# Patient Record
Sex: Female | Born: 1960
Health system: Southern US, Community
[De-identification: ages and names within clinical notes are randomized; demographics above are authoritative.]

## PROBLEM LIST (undated history)

## (undated) DIAGNOSIS — N3000 Acute cystitis without hematuria: Secondary | ICD-10-CM

## (undated) DIAGNOSIS — Z860100 Personal history of colon polyps, unspecified: Secondary | ICD-10-CM

## (undated) DIAGNOSIS — D259 Leiomyoma of uterus, unspecified: Secondary | ICD-10-CM

## (undated) DIAGNOSIS — C8590 Non-Hodgkin lymphoma, unspecified, unspecified site: Secondary | ICD-10-CM

## (undated) DIAGNOSIS — E7800 Pure hypercholesterolemia, unspecified: Secondary | ICD-10-CM

## (undated) DIAGNOSIS — Z8619 Personal history of other infectious and parasitic diseases: Secondary | ICD-10-CM

## (undated) DIAGNOSIS — F419 Anxiety disorder, unspecified: Secondary | ICD-10-CM

## (undated) DIAGNOSIS — I10 Essential (primary) hypertension: Secondary | ICD-10-CM

## (undated) DIAGNOSIS — M1990 Unspecified osteoarthritis, unspecified site: Secondary | ICD-10-CM

## (undated) DIAGNOSIS — K5090 Crohn's disease, unspecified, without complications: Secondary | ICD-10-CM

## (undated) DIAGNOSIS — G4700 Insomnia, unspecified: Secondary | ICD-10-CM

## (undated) DIAGNOSIS — G43909 Migraine, unspecified, not intractable, without status migrainosus: Secondary | ICD-10-CM

## (undated) DIAGNOSIS — C801 Malignant (primary) neoplasm, unspecified: Secondary | ICD-10-CM

## (undated) HISTORY — DX: Migraine, unspecified, not intractable, without status migrainosus: G43.909

## (undated) HISTORY — DX: Anxiety disorder, unspecified: F41.9

## (undated) HISTORY — DX: Personal history of colonic polyps: Z86.010

## (undated) HISTORY — DX: Acute cystitis without hematuria: N30.00

## (undated) HISTORY — DX: Crohn's disease, unspecified, without complications: K50.90

## (undated) HISTORY — DX: Essential (primary) hypertension: I10

## (undated) HISTORY — DX: Leiomyoma of uterus, unspecified: D25.9

## (undated) HISTORY — DX: Unspecified osteoarthritis, unspecified site: M19.90

## (undated) HISTORY — DX: Non-Hodgkin lymphoma, unspecified, unspecified site: C85.90

## (undated) HISTORY — DX: Pure hypercholesterolemia, unspecified: E78.00

## (undated) HISTORY — PX: TUBAL LIGATION: SHX77

## (undated) HISTORY — DX: Personal history of colon polyps, unspecified: Z86.0100

---

## 2007-04-05 HISTORY — PX: ESOPHAGOGASTRODUODENOSCOPY: SHX1529

## 2014-09-11 HISTORY — PX: COLONOSCOPY: SHX174

## 2017-10-02 HISTORY — PX: COLONOSCOPY: SHX174

## 2018-10-19 ENCOUNTER — Other Ambulatory Visit: Payer: Self-pay

## 2018-10-19 ENCOUNTER — Telehealth (INDEPENDENT_AMBULATORY_CARE_PROVIDER_SITE_OTHER): Payer: Commercial Managed Care - PPO | Admitting: Gastroenterology

## 2018-10-19 ENCOUNTER — Encounter: Payer: Self-pay | Admitting: Gastroenterology

## 2018-10-19 VITALS — Ht 59.0 in | Wt 140.0 lb

## 2018-10-19 DIAGNOSIS — K50919 Crohn's disease, unspecified, with unspecified complications: Secondary | ICD-10-CM

## 2018-10-19 DIAGNOSIS — R109 Unspecified abdominal pain: Secondary | ICD-10-CM | POA: Diagnosis not present

## 2018-10-19 NOTE — Progress Notes (Signed)
Chief Complaint:   Referring Provider:  [name], MD      ASSESSMENT AND PLAN;   #1.  Abdo pain LLQ/LUQ  #2. Crohn's disease: Dx 2004, H/O PSBO 02/2007 managed conservatively, involving TI and right colon.  In remission with Lialda.  Last colon 09/2017 Dr Jerilynn Mages (report awaited)   Plan: - CT abdo/pelvis with PO/IV contrast. - CBC, CMP, CRP and sed rate. - Please obtain previous records. - Continue lialda 2/day. - May need steroids. - FU after CT.   HPI:    Denise Walls is a 58 y.o. female  Who does not speak any [place], daughter is the interpreter C/O abdo pain LUQ/LLQ -crampy, associated with nausea but no vomiting.  She denies having any significant heartburn. Has been having increasing abdominal bloating. Denies having any diarrhea.  Lately has been more constipated. Has not been eating very well. Has lost 10 pounds over last 1 month. No fever chills or joint pains.  No skin rash.  Denies having any jaundice dark urine or pale stools.  No urinary symptoms.  Past GI procedures: -Colonoscopy 09/11/2014 ([facility]) small internal hemorrhoids.  No active Crohn's.  Normal TI. Bx- neg.  Past Medical History:  Diagnosis Date  . Acute cystitis   . Anxiety   . Arthritis   . Crohn disease ([place])    dx 2004, history of small bowel obstruction 02/2007 treated conservatively  . History of colon polyps   . HTN (hypertension)   . Hypercholesterolemia   . Migraine   . Uterine fibroid     Past Surgical History:  Procedure Laterality Date  . COLONOSCOPY  09/11/2014   Small internal hemorrhoids. Otherwise normal colonoscopy to terminal ileum  . COLONOSCOPY  10/02/2017  . ESOPHAGOGASTRODUODENOSCOPY  04/05/2007   Mild gastritis. Otherwise, normal esophagogastroduodenoscopy  . TUBAL LIGATION      Family History  Problem Relation Age of Onset  . Colon cancer Neg Hx     Social History   Tobacco Use  . Smoking status: Not on file  Substance Use Topics  . Alcohol use:  Not on file  . Drug use: Not on file    Current Outpatient Medications  Medication Sig Dispense Refill  . benzonatate (TESSALON) 100 MG capsule 2 capsules daily as needed.    . hydrochlorothiazide (HYDRODIURIL) 25 MG tablet 1 tablet daily.    Marland Kitchen lovastatin (MEVACOR) 40 MG tablet Take 1 tablet by mouth daily.    . mesalamine (LIALDA) 1.2 g EC tablet 2 tablets daily.    . potassium chloride (KLOR-CON) 8 MEQ tablet Take 1 tablet by mouth daily.    Marland Kitchen amLODipine (NORVASC) 5 MG tablet Take 1 tablet by mouth daily.     No current facility-administered medications for this visit.     Allergies  Allergen Reactions  . Nsaids     Abdominal pain, GI Bleeding     Review of Systems:  Constitutional: Denies fever, chills, diaphoresis, appetite change and fatigue.  HEENT: Denies photophobia, eye pain, redness, hearing loss, ear pain, congestion, sore throat, rhinorrhea, sneezing, mouth sores, neck pain, neck stiffness and tinnitus.   Respiratory: Denies SOB, DOE, cough, chest tightness,  and wheezing.   Cardiovascular: Denies chest pain, palpitations and leg swelling.  Genitourinary: Denies dysuria, urgency, frequency, hematuria, flank pain and difficulty urinating.  Musculoskeletal: Denies myalgias, back pain, joint swelling, arthralgias and gait problem.  Skin: No rash.  Neurological: Denies dizziness, seizures, syncope, weakness, light-headedness, numbness and headaches.  Hematological: Denies adenopathy.  Easy bruising, personal or family bleeding history  Psychiatric/Behavioral: Has anxiety/depression     Physical Exam:    Ht 4' 11"  (1.499 m)   Wt 140 lb (63.5 kg)   BMI 28.28 kg/m  Filed Weights   10/19/18 1551  Weight: 140 lb (63.5 kg)   Constitutional:  Well-developed, in no acute distress. Psychiatric: Normal mood and affect. Behavior is normal.   Data Reviewed: I have personally reviewed following labs and imaging studies This service was provided via telemedicine.  The  patient was located at home.  The provider was located in office.  The patient did consent to this telephone visit and is aware of possible charges through their insurance for this visit.  The patient was referred by Dr. Selena Batten.  The other persons participating in this telemedicine service were Yasmin (daughter) and their role was interpreter.  Time spent on call/coordination of care: 45 min    Carmell Austria, MD 10/19/2018, 4:00 PM  Cc: [name], MD

## 2018-10-19 NOTE — Patient Instructions (Addendum)
To help prevent the possible spread of infection to our patients, communities, and staff; we will be implementing the following measures:  As of now we are not allowing any visitors/family members to accompany you to any upcoming appointments with [facility]. If you have any concerns about this please contact our office to discuss prior to the appointment.   You have been scheduled for a CT [facility] of the abdomen and pelvis [facility] [place] [address] [facility]   You are scheduled on Friday June 12th at 10:00am. You should arrive 15 minutes prior to your appointment time for registration. Please follow the written instructions below on the day of your exam:  WARNING: IF YOU ARE ALLERGIC TO IODINE/X-RAY DYE, PLEASE NOTIFY RADIOLOGY IMMEDIATELY AT [phone]! YOU WILL BE GIVEN A 13 HOUR PREMEDICATION PREP.  1) Do not eat or drink anything after 6:00am (4 hours prior to your test) 2) You have been given 2 bottles of oral contrast to drink. The solution may taste better if refrigerated, but do NOT add ice or any other liquid to this solution. Shake well before drinking.    Drink 1 bottle of contrast @ 8:00am (2 hours prior to your exam)  Drink 1 bottle of contrast @ 9:00am (1 hour prior to your exam)  You may take any medications as prescribed with a small amount of water, if necessary. If you take any of the following medications: METFORMIN, GLUCOPHAGE, GLUCOVANCE, AVANDAMET, RIOMET, FORTAMET, [place] MET, JANUMET, GLUMETZA or METAGLIP, you MAY be asked to HOLD this medication 48 hours AFTER the exam.  The purpose of you drinking the oral contrast is to aid in the visualization of your intestinal tract. The contrast solution may cause some diarrhea. Depending on your individual set of symptoms, you may also receive an intravenous injection of x-ray contrast/dye. Plan on being at [facility] for 30 minutes or longer, depending on  the type of exam you are having performed.  This test typically takes 30-45 minutes to complete.  If you have any questions regarding your exam or if you need to reschedule, you may call the CT department at [phone] between the hours of 8:00 am and 5:00 pm, Monday-Friday.  ________________________________________________________________________   Your provider has requested that you go to the basement level for lab work at [facility]. Press "B" on the elevator. The lab is located at the first door on the left as you exit the elevator. Please pick up contrast on the 3rd floor.   Have any questions please call [phone]

## 2018-10-20 ENCOUNTER — Other Ambulatory Visit (INDEPENDENT_AMBULATORY_CARE_PROVIDER_SITE_OTHER): Payer: Commercial Managed Care - PPO

## 2018-10-20 DIAGNOSIS — R109 Unspecified abdominal pain: Secondary | ICD-10-CM

## 2018-10-20 DIAGNOSIS — K50919 Crohn's disease, unspecified, with unspecified complications: Secondary | ICD-10-CM

## 2018-10-20 LAB — COMPREHENSIVE METABOLIC PANEL
ALT: 21 U/L (ref 0–35)
AST: 25 U/L (ref 0–37)
Albumin: 3.8 g/dL (ref 3.5–5.2)
Alkaline Phosphatase: 64 U/L (ref 39–117)
BUN: 14 mg/dL (ref 6–23)
CO2: 29 mEq/L (ref 19–32)
Calcium: 9.2 mg/dL (ref 8.4–10.5)
Chloride: 98 mEq/L (ref 96–112)
Creatinine, Ser: 0.68 mg/dL (ref 0.40–1.20)
GFR: 88.65 mL/min (ref >60.00)
Glucose, Bld: 100 mg/dL — ABNORMAL HIGH (ref 70–99)
Potassium: 3.5 mEq/L (ref 3.5–5.1)
Sodium: 136 mEq/L (ref 135–145)
Total Bilirubin: 0.4 mg/dL (ref 0.2–1.2)
Total Protein: 7.4 g/dL (ref 6.0–8.3)

## 2018-10-20 LAB — CBC WITH DIFFERENTIAL/PLATELET
Basophils Absolute: 0.1 10*3/uL (ref 0.0–0.1)
Basophils Relative: 1.3 % (ref 0.0–3.0)
Eosinophils Absolute: 0 10*3/uL (ref 0.0–0.7)
Eosinophils Relative: 0.7 % (ref 0.0–5.0)
HCT: 34 % — ABNORMAL LOW (ref 36.0–46.0)
Hemoglobin: 11.2 g/dL — ABNORMAL LOW (ref 12.0–15.0)
Lymphocytes Relative: 19 % (ref 12.0–46.0)
Lymphs Abs: 1 10*3/uL (ref 0.7–4.0)
MCHC: 33.1 g/dL (ref 30.0–36.0)
MCV: 80.3 fl (ref 78.0–100.0)
Monocytes Absolute: 0.4 10*3/uL (ref 0.1–1.0)
Monocytes Relative: 7.3 % (ref 3.0–12.0)
Neutro Abs: 3.7 10*3/uL (ref 1.4–7.7)
Neutrophils Relative %: 71.7 % (ref 43.0–77.0)
Platelets: 440 10*3/uL — ABNORMAL HIGH (ref 150.0–400.0)
RBC: 4.23 Mil/uL (ref 3.87–5.11)
RDW: 15.7 % — ABNORMAL HIGH (ref 11.5–15.5)
WBC: 5.1 10*3/uL (ref 4.0–10.5)

## 2018-10-20 LAB — SEDIMENTATION RATE: Sed Rate: 87 mm/hr — ABNORMAL HIGH (ref 0–30)

## 2018-10-20 LAB — C-REACTIVE PROTEIN: CRP: 11.4 mg/dL (ref 0.5–20.0)

## 2018-10-22 ENCOUNTER — Telehealth: Payer: Self-pay | Admitting: Gastroenterology

## 2018-10-22 ENCOUNTER — Other Ambulatory Visit: Payer: Self-pay

## 2018-10-22 MED ORDER — PREDNISONE 10 MG PO TABS: ORAL_TABLET | ORAL | 0 refills | Status: DC

## 2018-10-22 NOTE — Telephone Encounter (Signed)
Patient daughter called back in returning your call about lab results. The daughter speaks Cleophus Molt and would like a call back [phone]

## 2018-10-22 NOTE — Telephone Encounter (Signed)
Spoke with pts daughter and she is aware. See result note.

## 2018-10-27 NOTE — Progress Notes (Signed)
Addendum:  Records from Dr. Lyda Jester reviewed.  Colonoscopy 10/02/2017-normal to TI except for internal hemorrhoids.  Biopsies-terminal ileal biopsies, right colonic biopsies and left colonic biopsies were unremarkable.  Recommended to repeat in 5 years.  EGD 10/02/2017-gastritis, negative CLOtest

## 2018-10-29 ENCOUNTER — Encounter (HOSPITAL_BASED_OUTPATIENT_CLINIC_OR_DEPARTMENT_OTHER): Payer: Self-pay

## 2018-10-29 ENCOUNTER — Other Ambulatory Visit: Payer: Self-pay

## 2018-10-29 ENCOUNTER — Ambulatory Visit (HOSPITAL_BASED_OUTPATIENT_CLINIC_OR_DEPARTMENT_OTHER)
Admission: RE | Admit: 2018-10-29 | Discharge: 2018-10-29 | Disposition: A | Discharge status: Home / Self Care | Payer: Commercial Managed Care - PPO | Source: Ambulatory Visit | Attending: Gastroenterology | Admitting: Gastroenterology

## 2018-10-29 DIAGNOSIS — R109 Unspecified abdominal pain: Secondary | ICD-10-CM | POA: Insufficient documentation

## 2018-10-29 DIAGNOSIS — K50919 Crohn's disease, unspecified, with unspecified complications: Secondary | ICD-10-CM | POA: Diagnosis present

## 2018-10-29 MED ORDER — IOHEXOL 300 MG/ML  SOLN
100.0000 mL | Freq: Once | INTRAMUSCULAR | Status: AC | PRN
  Administered 2018-10-29: 100 mL via INTRAVENOUS

## 2018-11-01 ENCOUNTER — Other Ambulatory Visit: Payer: Self-pay

## 2018-11-01 DIAGNOSIS — L049 Acute lymphadenitis, unspecified: Secondary | ICD-10-CM

## 2018-11-01 DIAGNOSIS — R109 Unspecified abdominal pain: Secondary | ICD-10-CM

## 2018-11-01 DIAGNOSIS — C8590 Non-Hodgkin lymphoma, unspecified, unspecified site: Secondary | ICD-10-CM

## 2018-11-05 ENCOUNTER — Other Ambulatory Visit: Payer: Commercial Managed Care - PPO

## 2018-11-05 DIAGNOSIS — R109 Unspecified abdominal pain: Secondary | ICD-10-CM

## 2018-11-05 DIAGNOSIS — L049 Acute lymphadenitis, unspecified: Secondary | ICD-10-CM

## 2018-11-06 LAB — LACTATE DEHYDROGENASE: LDH: 274 IU/L — ABNORMAL HIGH (ref 119–226)

## 2018-11-10 ENCOUNTER — Other Ambulatory Visit: Payer: Self-pay

## 2018-11-10 ENCOUNTER — Encounter (HOSPITAL_COMMUNITY)
Admission: RE | Admit: 2018-11-10 | Discharge: 2018-11-10 | Disposition: A | Discharge status: Home / Self Care | Payer: Commercial Managed Care - PPO | Source: Ambulatory Visit | Attending: Gastroenterology | Admitting: Gastroenterology

## 2018-11-10 DIAGNOSIS — L049 Acute lymphadenitis, unspecified: Secondary | ICD-10-CM | POA: Diagnosis not present

## 2018-11-10 LAB — GLUCOSE, CAPILLARY: Glucose-Capillary: 70 mg/dL (ref 70–99)

## 2018-11-10 MED ORDER — FLUDEOXYGLUCOSE F - 18 (FDG) INJECTION
7.8000 | Freq: Once | INTRAVENOUS | Status: AC | PRN
  Administered 2018-11-10: 7.8 via INTRAVENOUS

## 2018-11-12 ENCOUNTER — Other Ambulatory Visit: Payer: Self-pay

## 2018-11-12 DIAGNOSIS — C8590 Non-Hodgkin lymphoma, unspecified, unspecified site: Secondary | ICD-10-CM

## 2018-11-12 NOTE — Progress Notes (Signed)
Lymph node bi

## 2018-11-15 ENCOUNTER — Telehealth: Payer: Self-pay | Admitting: Hematology

## 2018-11-15 ENCOUNTER — Telehealth: Payer: Self-pay

## 2018-11-15 ENCOUNTER — Other Ambulatory Visit: Payer: Self-pay | Admitting: *Deleted

## 2018-11-15 NOTE — Telephone Encounter (Signed)
Spoke with patient daughter to confirm new patient appointment 6/30 at 130 pm. Pt daughter suggested date/time just incase an interpreter is unavailable

## 2018-11-15 NOTE — Telephone Encounter (Signed)
Called patient via Interpreter: Denise Walls [phone]. Spoke with patient's daughter Delana Meyer. Let her know a Lymph Node Biopsy  and a referral to the [facility] has been made for patient. Also that they both would be calling this week to schedule the appts.

## 2018-11-15 NOTE — Telephone Encounter (Signed)
See phone note

## 2018-11-15 NOTE — Telephone Encounter (Signed)
-----   Message from Jackquline Denmark, MD sent at 11/12/2018  3:44 PM EDT ----- I have discussed  PET results, in detail with the patient's daughter. Plan: -Lymph node biopsy from interventional radiology.  Please arrange. -Appointment with Dr. Burney Gauze  -Oncology coordinator. Send report to family physician (Dr  Ardis Rowan) and Dr Antionette Char

## 2018-11-16 ENCOUNTER — Other Ambulatory Visit: Payer: Self-pay

## 2018-11-16 ENCOUNTER — Encounter: Payer: Self-pay | Admitting: *Deleted

## 2018-11-16 ENCOUNTER — Encounter: Payer: Self-pay | Admitting: Hematology

## 2018-11-16 ENCOUNTER — Other Ambulatory Visit: Payer: Self-pay | Admitting: Hematology

## 2018-11-16 ENCOUNTER — Inpatient Hospital Stay: Payer: Commercial Managed Care - PPO | Attending: Hematology

## 2018-11-16 ENCOUNTER — Inpatient Hospital Stay (HOSPITAL_BASED_OUTPATIENT_CLINIC_OR_DEPARTMENT_OTHER): Payer: Commercial Managed Care - PPO | Admitting: Hematology

## 2018-11-16 VITALS — BP 121/79 | HR 72 | Temp 98.5°F | Resp 18 | Ht 60.0 in | Wt 131.4 lb

## 2018-11-16 DIAGNOSIS — Z79899 Other long term (current) drug therapy: Secondary | ICD-10-CM | POA: Diagnosis not present

## 2018-11-16 DIAGNOSIS — R591 Generalized enlarged lymph nodes: Secondary | ICD-10-CM

## 2018-11-16 DIAGNOSIS — D649 Anemia, unspecified: Secondary | ICD-10-CM

## 2018-11-16 DIAGNOSIS — K5090 Crohn's disease, unspecified, without complications: Secondary | ICD-10-CM | POA: Insufficient documentation

## 2018-11-16 DIAGNOSIS — R630 Anorexia: Secondary | ICD-10-CM | POA: Diagnosis not present

## 2018-11-16 DIAGNOSIS — R634 Abnormal weight loss: Secondary | ICD-10-CM

## 2018-11-16 DIAGNOSIS — R1032 Left lower quadrant pain: Secondary | ICD-10-CM | POA: Diagnosis not present

## 2018-11-16 DIAGNOSIS — R109 Unspecified abdominal pain: Secondary | ICD-10-CM

## 2018-11-16 DIAGNOSIS — C8330 Diffuse large B-cell lymphoma, unspecified site: Secondary | ICD-10-CM | POA: Insufficient documentation

## 2018-11-16 LAB — CMP (CANCER CENTER ONLY)
ALT: 21 U/L (ref 0–44)
AST: 18 U/L (ref 15–41)
Albumin: 4 g/dL (ref 3.5–5.0)
Alkaline Phosphatase: 49 U/L (ref 38–126)
Anion gap: 10 (ref 5–15)
BUN: 26 mg/dL — ABNORMAL HIGH (ref 6–20)
CO2: 27 mmol/L (ref 22–32)
Calcium: 9.2 mg/dL (ref 8.9–10.3)
Chloride: 105 mmol/L (ref 98–111)
Creatinine: 0.91 mg/dL (ref 0.44–1.00)
GFR, Est AFR Am: >60 mL/min (ref >60)
GFR, Estimated: >60 mL/min (ref >60)
Glucose, Bld: 85 mg/dL (ref 70–99)
Potassium: 3.9 mmol/L (ref 3.5–5.1)
Sodium: 142 mmol/L (ref 135–145)
Total Bilirubin: 0.4 mg/dL (ref 0.3–1.2)
Total Protein: 6.6 g/dL (ref 6.5–8.1)

## 2018-11-16 LAB — CBC WITH DIFFERENTIAL (CANCER CENTER ONLY)
Abs Immature Granulocytes: 0.02 10*3/uL (ref 0.00–0.07)
Basophils Absolute: 0 10*3/uL (ref 0.0–0.1)
Basophils Relative: 1 %
Eosinophils Absolute: 0.1 10*3/uL (ref 0.0–0.5)
Eosinophils Relative: 1 %
HCT: 37.8 % (ref 36.0–46.0)
Hemoglobin: 11.8 g/dL — ABNORMAL LOW (ref 12.0–15.0)
Immature Granulocytes: 0 %
Lymphocytes Relative: 22 %
Lymphs Abs: 1 10*3/uL (ref 0.7–4.0)
MCH: 26.6 pg (ref 26.0–34.0)
MCHC: 31.2 g/dL (ref 30.0–36.0)
MCV: 85.1 fL (ref 80.0–100.0)
Monocytes Absolute: 0.3 10*3/uL (ref 0.1–1.0)
Monocytes Relative: 6 %
Neutro Abs: 3.3 10*3/uL (ref 1.7–7.7)
Neutrophils Relative %: 70 %
Platelet Count: 228 10*3/uL (ref 150–400)
RBC: 4.44 MIL/uL (ref 3.87–5.11)
RDW: 17.7 % — ABNORMAL HIGH (ref 11.5–15.5)
WBC Count: 4.7 10*3/uL (ref 4.0–10.5)
nRBC: 0 % (ref 0.0–0.2)

## 2018-11-16 LAB — LACTATE DEHYDROGENASE: LDH: 364 U/L — ABNORMAL HIGH (ref 98–192)

## 2018-11-16 LAB — SAVE SMEAR(SSMR), FOR PROVIDER SLIDE REVIEW

## 2018-11-16 LAB — URIC ACID: Uric Acid, Serum: 4.8 mg/dL (ref 2.5–7.1)

## 2018-11-16 MED ORDER — TRAMADOL HCL 50 MG PO TABS: 50.0000 mg | ORAL_TABLET | Freq: Three times a day (TID) | ORAL | 1 refills | Status: DC | PRN

## 2018-11-16 NOTE — Progress Notes (Signed)
[name] NOTE  Patient Care Team: Denise Berger, MD as PCP - General (Family Medicine)  HEME/ONC OVERVIEW: 1. Diffuse lymphadenopathy, suspicious for lymphoma -10/2018: CT AP (for pain) showed abdominal lymphadenopathy, largest 3.2cm mesentery nodal mass encasing the SMA; PET showed widespread FDG-avid lymphadenopathy involving neck, chest and abdomen/pelvis   PERTINENT NON-HEM/ONC PROBLEMS: 1. Hx of Crohn's disease, complicated by partial SBO involving TI and right colon; on [place] -Colonoscopy unremarkable in 2019   ASSESSMENT & PLAN:   Diffuse lymphadenopathy, suspicious for lymphoma -I reviewed the patient's records in detail, including GI clinic notes, lab studies, and imaging results -I also independently reviewed the radiologic images of recent CT abdomen/pelvis and PET, and agree with the findings as documented -In summary, patient has a history of Crohn's disease, complicated by partial small bowel obstruction in early 2000's.  Her most recent colonoscopy in 2019 with markable.  She was referred to Dr. Lyndel Walls of gastroenterology for evaluation of abdominal pain, who ordered CT abdomen/pelvis that showed diffuse abdominal lymphadenopathy, the largest 3.2 cm involving the mesenteric nodal mass encasing the SMA.  Subsequent PET showed widespread FDG-avid lymphadenopathy involving the neck, chest, and abdomen/pelvis. -I reviewed the imaging results in detail the patient, as well as the differential diagnoses and the next steps for work-up -Given the diffuse lymphadenopathy, this is very suspicious for lymphoma and will need tissue confirmation -I have ordered baseline labs today, including LDH, uric acid, HIV, Hep B/C serologies  -Patient is currently scheduled for US-guided core bx on 11/22/2018 -In anticipation that this is likely high-grade lymphoma, we will go ahead and order port placement, anticipating that she will require chemotherapy; we will schedule it  after her LN bx so that we can get tissue confirmation first -In addition, given that the PET did not demonstrate any definite bone marrow involvement, I have requested the bone marrow biopsy to rule out marrow involvement -Finally, I have ordered baseline TTE to assess cardiac function in the event anthracycline is required  -Pending the work-up above, we will determine the treatment options  Abdominal pain -Likely due to lymphoma involvement -I have prescribed tramadol 22m q8hrs PRN for pain -I counseled the patient on some of the side effects, including constipation, and recommended her to take OTC laxatives as needed to maintain bowel movement  Normocytic anemia -Possibly due to anemia of chronic disease -Patient denies any symptoms of bleeding -I have ordered iron profile -Bone marrow bx as above to rule out lymphoma involvement  -We will monitor it for now   Orders Placed This Encounter  Procedures  . IR IMAGING GUIDED PORT INSERTION    Standing Status:   Future    Standing Expiration Date:   01/16/2020    Order Specific Question:   Reason for Exam (SYMPTOM  OR DIAGNOSIS REQUIRED)    Answer:   Suspected lymphoma, need chemo access; pls schedule after the LN bx on 11/22/2018    Order Specific Question:   [facility]    [facility]   Order Specific Question:   Is the patient pregnant?    Answer:   No  . CBC with Differential (Cancer Center Only)    Standing Status:   Future    Standing Expiration Date:   12/21/2019  . [facility] ([facility]    Standing Status:   Future    Standing Expiration Date:   12/21/2019  . Lactate dehydrogenase    Standing Status:  Future    Standing Expiration Date:   12/21/2019  . ECHOCARDIOGRAM COMPLETE    Standing Status:   Future    Standing Expiration Date:   02/16/2020    Order Specific Question:   Where should this test be performed    Answer:   [facility]    Order Specific Question:   Perflutren DEFINITY  (image enhancing agent) should be administered unless hypersensitivity or allergy exist    Answer:   Administer Perflutren    Order Specific Question:   Reason for exam-Echo    Answer:   Chemo  V67.2 / Z09    Order Specific Question:   Other Comments    Answer:   Suspected lymphoma, baseline echo prior to chemo    A total of more than 60 minutes were spent face-to-face with the patient during this encounter and over half of that time was spent on counseling and coordination of care as outlined above.    All questions were answered. The patient knows to call the clinic with any problems, questions or concerns.  Return in 2 weeks to follow up bx results, including LN and bone marrow, and to discuss treatment options.   Denise Men, MD 11/16/2018 3:02 PM   CHIEF COMPLAINTS/PURPOSE OF CONSULTATION:  "I am here to find out what next"  HISTORY OF PRESENTING ILLNESS:  Denise Walls 58 y.o. female is here because of diffuse lymphadenopathy on CT, concerning for lymphoma.  Patient speaks only Spanish, and therefore the information was obtained via [place] interpreter.  Patient reports that she has history of Crohn's disease, for which she has been taking mesalamine with very good control of her symptoms.  She was previously followed by Dr. Lyndel Walls of gastroenterology and to transition her care to a different gastroenterologist after Dr. Lyndel Walls moved to [place].  Her new gastroenterologist stopped her mesalamine some time in 2019, and soon after that, she began to develop new onset left lower quadrant discomfort/pain, nonradiating, dull, intermittent, mild to moderate in intensity, exacerbated by eating and improved with defecation.  She underwent colonoscopy, which was unremarkable, and was recommended to modify her diet without any improvement.  She then re-established her care with Dr. Lyndel Walls, who ordered CT abdomen/pelvis that showed diffuse abdominal lymphadenopathy.  PET showed diffuse lymph node  involvement in the neck, chest, abdomen and the pelvis, and patient was referred to oncology for further evaluation.    Patient reports that she still has intermittent left lower quadrant pain, but it became worse over the past 1 and a half months, for which she has been taking tramadol as needed with improvement in the pain.  She also reports decreased appetite and early satiety, as well as a weight loss of 10 to 15 pounds over the past 4 months.  She denies any fever, chill, night sweats or lymphadenopathy.  I have reviewed her chart and materials related to her cancer extensively and collaborated history with the patient. Summary of oncologic history is as follows: Oncology History   No history exists.    MEDICAL HISTORY:  Past Medical History:  Diagnosis Date  . Acute cystitis   . Anxiety   . Arthritis   . Crohn disease ([place])    dx 2004, history of small bowel obstruction 02/2007 treated conservatively  . History of colon polyps   . HTN (hypertension)   . Hypercholesterolemia   . Migraine   . Uterine fibroid     SURGICAL HISTORY: Past Surgical History:  Procedure Laterality Date  .  COLONOSCOPY  09/11/2014   Small internal hemorrhoids. Otherwise normal colonoscopy to terminal ileum  . COLONOSCOPY  10/02/2017  . ESOPHAGOGASTRODUODENOSCOPY  04/05/2007   Mild gastritis. Otherwise, normal esophagogastroduodenoscopy  . TUBAL LIGATION      SOCIAL HISTORY: Social History   Socioeconomic History  . Marital status: Married    Spouse name: Not on file  . Number of children: Not on file  . Years of education: Not on file  . Highest education level: Not on file  Occupational History  . Not on file  Social Needs  . Financial resource strain: Not on file  . Food insecurity    Worry: Not on file    Inability: Not on file  . Transportation needs    Medical: Not on file    Non-medical: Not on file  Tobacco Use  . Smoking status: Never Smoker  . Smokeless tobacco: Never Used   Substance and Sexual Activity  . Alcohol use: Never    Frequency: Never  . Drug use: Never  . Sexual activity: Yes  Lifestyle  . Physical activity    Days per week: Not on file    Minutes per session: Not on file  . Stress: Not on file  Relationships  . Social Herbalist on phone: Not on file    Gets together: Not on file    Attends religious service: Not on file    Active member of club or organization: Not on file    Attends meetings of clubs or organizations: Not on file    Relationship status: Not on file  . Intimate partner violence    Fear of current or ex partner: Not on file    Emotionally abused: Not on file    Physically abused: Not on file    Forced sexual activity: Not on file  Other Topics Concern  . Not on file  Social History Narrative  . Not on file    FAMILY HISTORY: Family History  Problem Relation Age of Onset  . Colon cancer Neg Hx     ALLERGIES:  is allergic to nsaids.  MEDICATIONS:  Current Outpatient Medications  Medication Sig Dispense Refill  . amLODipine (NORVASC) 5 MG tablet Take 1 tablet by mouth daily.    . benzonatate (TESSALON) 100 MG capsule 2 capsules daily as needed.    . hydrochlorothiazide (HYDRODIURIL) 25 MG tablet 1 tablet daily.    Marland Kitchen lovastatin (MEVACOR) 40 MG tablet Take 1 tablet by mouth daily.    . mesalamine (LIALDA) 1.2 g EC tablet 2 tablets daily.    . potassium chloride (KLOR-CON) 8 MEQ tablet Take 1 tablet by mouth daily.    . predniSONE (DELTASONE) 10 MG tablet Take 2 pills by mouth daily for 14 days, take 1 pill daily for 14 days. 42 tablet 0  . traMADol (ULTRAM) 50 MG tablet TOME UNA TABLETA TODOS LOS D AS    . traMADol (ULTRAM) 50 MG tablet Take 1 tablet (50 mg total) by mouth every 8 (eight) hours as needed. 45 tablet 1   No current facility-administered medications for this visit.     REVIEW OF SYSTEMS:   Constitutional: ( - ) fevers, ( - )  chills , ( - ) night sweats Eyes: ( - ) blurriness of  vision, ( - ) double vision, ( - ) watery eyes Ears, nose, mouth, throat, and face: ( - ) mucositis, ( - ) sore throat Respiratory: ( + ) cough, ( - )  dyspnea, ( - ) wheezes Cardiovascular: ( - ) palpitation, ( - ) chest discomfort, ( - ) lower extremity swelling Gastrointestinal:  ( - ) nausea, ( - ) heartburn, ( + ) change in bowel habits Skin: ( - ) abnormal skin rashes Lymphatics: ( - ) new lymphadenopathy, ( - ) easy bruising Neurological: ( - ) numbness, ( - ) tingling, ( - ) new weaknesses Behavioral/Psych: ( - ) mood change, ( - ) new changes  All other systems were reviewed with the patient and are negative.  PHYSICAL EXAMINATION: ECOG PERFORMANCE STATUS: 1 - Symptomatic but completely ambulatory  Vitals:   11/16/18 1401  BP: 121/79  Pulse: 72  Resp: 18  Temp: 98.5 F (36.9 C)  SpO2: 98%   Filed Weights   11/16/18 1401  Weight: 131 lb 6.4 oz (59.6 kg)    GENERAL: alert, no distress and comfortable SKIN: skin color, texture, turgor are normal, no rashes or significant lesions EYES: conjunctiva are pink and non-injected, sclera clear OROPHARYNX: no exudate, no erythema; lips, buccal mucosa, and tongue normal  NECK: supple, non-tender LYMPH:  shotty cervical lymphadenopathy LUNGS: clear to auscultation with normal breathing effort HEART: regular rate & rhythm, no murmurs, no lower extremity edema ABDOMEN: soft, non-tender, non-distended, normal bowel sounds Musculoskeletal: no cyanosis of digits and no clubbing  PSYCH: alert & oriented x 3, fluent speech NEURO: no focal motor/sensory deficits  LABORATORY DATA:  I have reviewed the data as listed Lab Results  Component Value Date   WBC 4.7 11/16/2018   HGB 11.8 (L) 11/16/2018   HCT 37.8 11/16/2018   MCV 85.1 11/16/2018   PLT 228 11/16/2018   Lab Results  Component Value Date   NA 142 11/16/2018   K 3.9 11/16/2018   CL 105 11/16/2018   CO2 27 11/16/2018    RADIOGRAPHIC STUDIES: I have personally  reviewed the radiological images as listed and agreed with the findings in the report. Ct Abdomen Pelvis W Contrast  Result Date: 10/29/2018 CLINICAL DATA:  Abdominal pain, distension, nausea, diarrhea, constipation. Crohn's disease. EXAM: CT ABDOMEN AND PELVIS WITH CONTRAST TECHNIQUE: Multidetector CT imaging of the abdomen and pelvis was performed using the standard protocol following bolus administration of intravenous contrast. CONTRAST:  172m OMNIPAQUE IOHEXOL 300 MG/ML  SOLN COMPARISON:  None. FINDINGS: Lower chest: Moderate left pleural effusion. Associated left lower lobe opacity, likely atelectasis. 2.1 cm subcarinal nodal mass (series 2/image 1), incompletely visualized. Hepatobiliary: Liver is within normal limits. Gallbladder is unremarkable. No intrahepatic or extrahepatic ductal dilatation. Pancreas: Within normal limits. Spleen: Spleen is normal in size. Multiple hypoenhancing splenic lesions, including a dominant 13 mm lesion medially (series 2/image 26), although less conspicuous on delayed imaging. Adrenals/Urinary Tract: Adrenal glands are within normal limits. Kidneys are within normal limits.  No hydronephrosis. Bladder is underdistended although unremarkable. Stomach/Bowel: Stomach is within normal limits. No evidence of bowel obstruction. Normal appendix (series 2/image 60). Terminal ileum is within normal limits (series 2/image 56). No colonic wall thickening or inflammatory changes. Mild to moderate left colonic stool burden, suggesting constipation. Vascular/Lymphatic: No evidence of abdominal aortic aneurysm. Mild atherosclerotic calcifications at the origin of the left renal artery. Upper abdominal/retroperitoneal/pelvic lymphadenopathy, including: --13 mm short axis gastrohepatic node (series 2/image 24) --15 mm short axis aortocaval node (series 2/image 31) --19 mm short axis node anterior to the infrahepatic IVC (series 2/image 41) --32 mm short axis jejunal mesenteric nodal mass  encasing the SMA (series 2/image 50), partially necrotic --13 mm short  axis left obturator node (series 2/image 69) This overall appearance favors lymphoma, particularly the mesenteric nodal mass which encases the SMA. However, partial necrosis is relatively uncommon with lymphoma, and therefore nodal metastases remain within the differential. Reproductive: Uterus is within normal limits. Bilateral ovaries are within normal limits. Other: No abdominopelvic ascites. Musculoskeletal: Degenerative changes of the lower thoracic spine. IMPRESSION: Abdominopelvic lymphadenopathy, including a dominant 3.2 cm short axis jejunal mesentery nodal mass encasing the SMA, partially necrotic. Overall appearance favors lymphoma, although nodal metastases are also possible. Spleen is normal in size. However, hypoenhancing lesions are suspected on the portal venous phase, raising the possibility of lymphomatous involvement. Suspected subcarinal nodal mass, incompletely evaluated. Consider CT chest or PET-CT for further evaluation. Moderate left pleural effusion. Associated left lower lobe opacity, likely atelectasis. Electronically Signed   By: Julian Hy M.D.   On: 10/29/2018 11:27   Nm Pet Image Initial (pi) Skull Base To Thigh  Result Date: 11/10/2018 CLINICAL DATA:  Initial treatment strategy for acute lymph adenitis. Mesenteric lymphadenopathy. Concern for lymphoma. EXAM: NUCLEAR MEDICINE PET SKULL BASE TO THIGH TECHNIQUE: 7.8 mCi F-18 FDG was injected intravenously. Full-ring PET imaging was performed from the skull base to thigh after the radiotracer. CT data was obtained and used for attenuation correction and anatomic localization. Fasting blood glucose: 70 mg/dl COMPARISON:  CT 10/29/2018 FINDINGS: Mediastinal blood pool activity: SUV max see 0.1 Liver activity: SUV max NA NECK: Several small hypermetabolic lymph nodes in the lower neck. Small lymph node in the LEFT level 3 location beneath the  sternocleidomastoid muscle with SUV max equal 10.0. Node difficult to seen on the CT portion Bilateral supraclavicular nodes are hypermetabolic but relatively small measuring 8-10 mm short axis with SUV max equal 10.2. Hypermetabolic lymph node beneath the medial LEFT pectoralis muscle is also small measuring 9 mm with SUV max equal 7.3 (image 45/4). Incidental CT findings: none CHEST: Intense hypermetabolic subcarinal lymph node with SUV max equal 17.5. Hypermetabolic paratracheal nodes and prevascular nodes. Hypermetabolic LEFT internal mammary node SUV max equal 9.3. Hypermetabolic node in the precordial fat on the LEFT measuring 1.3 cm with SUV max equal 11.4. This may be is a good target lymph node. Incidental CT findings: Moderate layering LEFT effusion. ABDOMEN/PELVIS: Extensive hypermetabolic lymph nodes surrounding the crus of the diaphragm and paraspinal tissues. Masslike adenopathy in the central mesentery measures 3.5 cm in total with SUV max equal 16.6. Hypermetabolic thickening along the LEFT and RIGHT pericolic gutters (image 242 on the LEFT for example). Hypermetabolic lymph nodes extend into the pelvis with hypermetabolic operator nodes. There is a hypermetabolic LEFT external iliac lymph node with SUV max equal 10.9. This node is poorly defined on CT but is favored to measure 1 cm short axis on image 151/4. scattered nodes in the deep peritoneal space. Incidental CT findings: Uterus and ovaries normal SKELETON: No focal hypermetabolic activity to suggest skeletal metastasis. Incidental CT findings: none IMPRESSION: 1. 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 2. Nodal metastasis include the lower neck, mediastinum, mesentery, peritoneum and retroperitoneum, and iliac lymph nodes. 3. Spleen and bone marrow normal. 4. Moderate LEFT pleural effusion. 5. Target lymph nodes for sampling could include the LEFT external iliac lymph node, precordial lymph node in  the LEFT upper quadrant, or super clavicular/LEFT sub pectoralis nodes. Electronically Signed   By: Suzy Bouchard M.D.   On: 11/10/2018 14:15    PATHOLOGY: I have reviewed the pathology reports as documented in the oncologist history.

## 2018-11-16 NOTE — Progress Notes (Signed)
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 Denise Walls patient "Your Patient Navigator" handout which explains my role, areas in which I am able to help, and all the contact information for myself and the office. Also gave patient MD and Navigator business card. Reviewed with patient the general overview of expected course after initial diagnosis and time frame for all steps to be completed.  Patient completed visit with Dr. Maylon Peppers  Patient confirmed that daughter Denise Walls is primary contact for appointments and any additional medical needs.   Revisited with patient after MD visit. Patient will need  Echo -  Port Placement - to be scheduled one week after LN biopsy Bone Marrow - message sent to Ria Comment NP at [facility] to schedule.   Left message on daughter's voicemail asking her to call back to help with making appointments.  Will ensure that all appointments are made by end of week. Daughter called back to the office. All information shared with daughter. MyChart code sent with encouragement to sign up for service.   Patient understands all follow up procedures and expectations. They have my number to reach out for any further clarification or additional needs. Will call patient in 5-7 days to see if any further needs have presented, or if patient has any further questions or needs.

## 2018-11-17 ENCOUNTER — Encounter: Payer: Self-pay | Admitting: *Deleted

## 2018-11-17 ENCOUNTER — Other Ambulatory Visit: Payer: Self-pay | Admitting: Radiology

## 2018-11-17 LAB — HCV COMMENT:

## 2018-11-17 LAB — HEPATITIS B CORE ANTIBODY, TOTAL: Hep B Core Total Ab: NEGATIVE

## 2018-11-17 LAB — HEPATITIS C ANTIBODY (REFLEX): HCV Ab: 0.1 s/co ratio (ref 0.0–0.9)

## 2018-11-17 LAB — HEPATITIS B SURFACE ANTIGEN: Hepatitis B Surface Ag: NEGATIVE

## 2018-11-17 LAB — SOLUBLE TRANSFERRIN RECEPTOR: Transferrin Receptor: 16.8 nmol/L (ref 12.2–27.3)

## 2018-11-17 LAB — IRON AND TIBC
Iron: 53 ug/dL (ref 41–142)
Saturation Ratios: 19 % — ABNORMAL LOW (ref 21–57)
TIBC: 275 ug/dL (ref 236–444)
UIBC: 222 ug/dL (ref 120–384)

## 2018-11-17 LAB — FERRITIN: Ferritin: 206 ng/mL (ref 11–307)

## 2018-11-17 LAB — HEPATITIS B SURFACE ANTIBODY,QUALITATIVE: Hep B S Ab: NONREACTIVE

## 2018-11-18 ENCOUNTER — Other Ambulatory Visit: Payer: Self-pay | Admitting: Radiology

## 2018-11-18 ENCOUNTER — Other Ambulatory Visit: Payer: Self-pay | Admitting: Student

## 2018-11-18 ENCOUNTER — Telehealth: Payer: Self-pay | Admitting: Hematology

## 2018-11-18 ENCOUNTER — Inpatient Hospital Stay: Payer: Commercial Managed Care - PPO

## 2018-11-18 ENCOUNTER — Ambulatory Visit: Payer: Commercial Managed Care - PPO | Admitting: Hematology

## 2018-11-18 LAB — HIV ANTIBODY (ROUTINE TESTING W REFLEX): HIV Screen 4th Generation wRfx: NONREACTIVE

## 2018-11-18 NOTE — Telephone Encounter (Signed)
Appointments scheduled lmvm letter/calendar mailed per 6/30 los

## 2018-11-22 ENCOUNTER — Other Ambulatory Visit: Payer: Self-pay

## 2018-11-22 ENCOUNTER — Ambulatory Visit (HOSPITAL_COMMUNITY)
Admission: RE | Admit: 2018-11-22 | Discharge: 2018-11-22 | Disposition: A | Discharge status: Home / Self Care | Payer: Commercial Managed Care - PPO | Source: Ambulatory Visit | Attending: Gastroenterology | Admitting: Gastroenterology

## 2018-11-22 ENCOUNTER — Encounter (HOSPITAL_COMMUNITY): Payer: Self-pay

## 2018-11-22 DIAGNOSIS — C8590 Non-Hodgkin lymphoma, unspecified, unspecified site: Secondary | ICD-10-CM | POA: Insufficient documentation

## 2018-11-22 LAB — CBC
HCT: 38.3 % (ref 36.0–46.0)
Hemoglobin: 12.4 g/dL (ref 12.0–15.0)
MCH: 26.7 pg (ref 26.0–34.0)
MCHC: 32.4 g/dL (ref 30.0–36.0)
MCV: 82.4 fL (ref 80.0–100.0)
Platelets: 319 10*3/uL (ref 150–400)
RBC: 4.65 MIL/uL (ref 3.87–5.11)
RDW: 17.4 % — ABNORMAL HIGH (ref 11.5–15.5)
WBC: 3.8 10*3/uL — ABNORMAL LOW (ref 4.0–10.5)
nRBC: 0 % (ref 0.0–0.2)

## 2018-11-22 LAB — PROTIME-INR
INR: 1.1 (ref 0.8–1.2)
Prothrombin Time: 14.4 seconds (ref 11.4–15.2)

## 2018-11-22 MED ORDER — SODIUM CHLORIDE 0.9 % IV SOLN: INTRAVENOUS | Status: DC

## 2018-11-22 MED ORDER — HYDROCODONE-ACETAMINOPHEN 5-325 MG PO TABS: 1.0000 | ORAL_TABLET | ORAL | Status: DC | PRN

## 2018-11-22 MED ORDER — MIDAZOLAM HCL 2 MG/2ML IJ SOLN
INTRAMUSCULAR | Status: AC
  Filled 2018-11-22: qty 2

## 2018-11-22 MED ORDER — MIDAZOLAM HCL 2 MG/2ML IJ SOLN
INTRAMUSCULAR | Status: AC | PRN
  Administered 2018-11-22 (×2): 1 mg via INTRAVENOUS

## 2018-11-22 MED ORDER — SODIUM CHLORIDE 0.9 % IV SOLN
INTRAVENOUS | Status: AC | PRN
  Administered 2018-11-22: 10 mL/h via INTRAVENOUS

## 2018-11-22 MED ORDER — LIDOCAINE HCL (PF) 1 % IJ SOLN
INTRAMUSCULAR | Status: AC
  Filled 2018-11-22: qty 30

## 2018-11-22 MED ORDER — FENTANYL CITRATE (PF) 100 MCG/2ML IJ SOLN
INTRAMUSCULAR | Status: AC
  Filled 2018-11-22: qty 2

## 2018-11-22 MED ORDER — FENTANYL CITRATE (PF) 100 MCG/2ML IJ SOLN
INTRAMUSCULAR | Status: AC | PRN
  Administered 2018-11-22: 50 ug via INTRAVENOUS

## 2018-11-22 NOTE — Procedures (Signed)
Interventional Radiology Procedure:   Indications: Lymphadenopathy, lymphoma workup  Procedure: US guided left neck lymph node biopsy  Findings: Several small left supraclavicular lymph nodes.   6 cores from a dominant node  Complications: None     EBL: less than 10 ml  Plan: discharge to home in 1 hour.     Aydee Mcnew R. Anselm Pancoast, MD  Pager: [phone]

## 2018-11-22 NOTE — H&P (Signed)
Chief Complaint: Patient was seen in consultation today for supraclavicular lymph node biopsy at the request of [place]  Referring Physician(s): Arden  Supervising Physician: Markus Daft  Patient Status: [facility] - Out-pt  History of Present Illness: Denise Walls is a 58 y.o. female   Was seen by PCP for abd pain 10/19/18] Wt loss N no vomit abd bloating and cramping Hx Chrons disease  CT 6/12:  IMPRESSION: Abdominopelvic lymphadenopathy, including a dominant 3.2 cm short axis jejunal mesentery nodal mass encasing the SMA, partially necrotic. Overall appearance favors lymphoma, although nodal metastases are also possible. Spleen is normal in size. However, hypoenhancing lesions are suspected on the portal venous phase, raising the possibility of lymphomatous involvement. Suspected subcarinal nodal mass, incompletely evaluated. Consider CT chest or PET-CT for further evaluation. Moderate left pleural effusion. Associated left lower lobe opacity, likely atelectasis.  PET 6/24:  IMPRESSION: 1. 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 2. Nodal metastasis include the lower neck, mediastinum, mesentery, peritoneum and retroperitoneum, and iliac lymph nodes. 3. Spleen and bone marrow normal. 4. Moderate LEFT pleural effusion. 5. Target lymph nodes for sampling could include the LEFT external iliac lymph node, precordial lymph node in the LEFT upper quadrant, or super clavicular/LEFT sub pectoralis nodes   Dr Maylon Peppers note 6/30: -I reviewed the imaging results in detail the patient, as well as the differential diagnoses and the next steps for work-up -Given the diffuse lymphadenopathy, this is very suspicious for lymphoma and will need tissue confirmation -I have ordered baseline labs today, including LDH, uric acid, HIV, Hep B/C serologies  -Patient is currently scheduled for US-guided core bx on 11/22/2018 -In anticipation that  this is likely high-grade lymphoma, we will go ahead and order port placement, anticipating that she will require chemotherapy; we will schedule it after her LN bx so that we can get tissue confirmation first -In addition, given that the PET did not demonstrate any definite bone marrow involvement, I have requested the bone marrow biopsy to rule out marrow involvement -Finally, I have ordered baseline TTE to assess cardiac function in the event anthracycline is required  -Pending the work-up above, we will determine the treatment options   Now scheduled for supraclavicular lymph node biopsy    Past Medical History:  Diagnosis Date   Acute cystitis    Anxiety    Arthritis    Crohn disease ([place])    dx 2004, history of small bowel obstruction 02/2007 treated conservatively   History of colon polyps    HTN (hypertension)    Hypercholesterolemia    Migraine    Uterine fibroid     Past Surgical History:  Procedure Laterality Date   COLONOSCOPY  09/11/2014   Small internal hemorrhoids. Otherwise normal colonoscopy to terminal ileum   COLONOSCOPY  10/02/2017   ESOPHAGOGASTRODUODENOSCOPY  04/05/2007   Mild gastritis. Otherwise, normal esophagogastroduodenoscopy   TUBAL LIGATION      Allergies: Nsaids  Medications: Prior to Admission medications   Medication Sig Start Date End Date Taking? Authorizing Provider  amLODipine (NORVASC) 5 MG tablet Take 5 mg by mouth daily.  06/25/18  Yes [provider]  calcium carbonate (TUMS - DOSED IN MG ELEMENTAL CALCIUM) 500 MG chewable tablet Chew 2 tablets by mouth daily as needed for indigestion or heartburn.   Yes [provider]  cholecalciferol (VITAMIN D3) 25 MCG (1000 UT) tablet Take 1,000 Units by mouth daily.   Yes [provider]  hydrochlorothiazide (HYDRODIURIL) 25 MG tablet Take 25 mg  by mouth daily.   Yes [provider]  lovastatin (MEVACOR) 40 MG tablet Take 40 mg by mouth daily.   06/25/18  Yes [provider]  Melatonin 3 MG TABS Take 3 mg by mouth at bedtime as needed (sleep).   Yes [provider]  mesalamine (LIALDA) 1.2 g EC tablet Take 2.4 g by mouth daily.  05/15/17  Yes [provider]  Nutritional Supplements (IMMUNE ENHANCE) TABS Take 1 tablet by mouth daily.   Yes [provider]  OVER THE COUNTER MEDICATION Take 2 tablets by mouth daily as needed (acid reflux). Soothing digestive relief otc   Yes [provider]  polyethylene glycol (MIRALAX / GLYCOLAX) 17 g packet Take 17 g by mouth daily as needed for mild constipation.   Yes [provider]  potassium chloride (KLOR-CON) 8 MEQ tablet Take 8 mEq by mouth daily.  06/27/18  Yes [provider]  traMADol (ULTRAM) 50 MG tablet Take 1 tablet (50 mg total) by mouth every 8 (eight) hours as needed. Patient taking differently: Take 50 mg by mouth every 8 (eight) hours as needed for moderate pain.  11/16/18  Yes Tish Men, MD  predniSONE (DELTASONE) 10 MG tablet Take 2 pills by mouth daily for 14 days, take 1 pill daily for 14 days. Patient not taking: Reported on 11/18/2018 10/22/18   Jackquline Denmark, MD     Family History  Problem Relation Age of Onset   Colon cancer Neg Hx     Social History   Socioeconomic History   Marital status: Married    Spouse name: Not on file   Number of children: Not on file   Years of education: Not on file   Highest education level: Not on file  Occupational History   Not on file  Social Needs   Financial resource strain: Not on file   Food insecurity    Worry: Not on file    Inability: Not on file   Transportation needs    Medical: Not on file    Non-medical: Not on file  Tobacco Use   Smoking status: Never Smoker   Smokeless tobacco: Never Used  Substance and Sexual Activity   Alcohol use: Never    Frequency: Never   Drug use: Never   Sexual activity: Yes  Lifestyle   Physical activity    Days  per week: Not on file    Minutes per session: Not on file   Stress: Not on file  Relationships   Social connections    Talks on phone: Not on file    Gets together: Not on file    Attends religious service: Not on file    Active member of club or organization: Not on file    Attends meetings of clubs or organizations: Not on file    Relationship status: Not on file  Other Topics Concern   Not on file  Social History Narrative   Not on file    Review of Systems: A 12 point ROS discussed and pertinent positives are indicated in the HPI above.  All other systems are negative.  Review of Systems  Constitutional: Positive for activity change, appetite change, fatigue and unexpected weight change. Negative for fever.  Respiratory: Negative for shortness of breath.   Cardiovascular: Negative for chest pain.  Gastrointestinal: Positive for abdominal distention, abdominal pain and nausea.  Neurological: Negative for weakness.  Psychiatric/Behavioral: Negative for behavioral problems and confusion.    Vital Signs: BP Marland Kitchen)  135/97    Pulse 87    Temp (!) 97.5 F (36.4 C)    Ht 5' (1.524 m)    Wt 130 lb 1.1 oz (59 kg)    SpO2 100%    BMI 25.40 kg/m   Physical Exam Vitals signs reviewed.  Cardiovascular:     Rate and Rhythm: Normal rate and regular rhythm.     Heart sounds: Normal heart sounds.  Pulmonary:     Breath sounds: Normal breath sounds.  Abdominal:     General: There is distension.     Tenderness: There is abdominal tenderness.  Musculoskeletal: Normal range of motion.  Skin:    General: Skin is warm and dry.  Neurological:     Mental Status: She is alert and oriented to person, place, and time.  Psychiatric:        Mood and Affect: Mood normal.        Behavior: Behavior normal.        Thought Content: Thought content normal.        Judgment: Judgment normal.     Comments: Spoke to pt through [employer]     Imaging: Ct Abdomen Pelvis W  Contrast  Result Date: 10/29/2018 CLINICAL DATA:  Abdominal pain, distension, nausea, diarrhea, constipation. Crohn's disease. EXAM: CT ABDOMEN AND PELVIS WITH CONTRAST TECHNIQUE: Multidetector CT imaging of the abdomen and pelvis was performed using the standard protocol following bolus administration of intravenous contrast. CONTRAST:  198m OMNIPAQUE IOHEXOL 300 MG/ML  SOLN COMPARISON:  None. FINDINGS: Lower chest: Moderate left pleural effusion. Associated left lower lobe opacity, likely atelectasis. 2.1 cm subcarinal nodal mass (series 2/image 1), incompletely visualized. Hepatobiliary: Liver is within normal limits. Gallbladder is unremarkable. No intrahepatic or extrahepatic ductal dilatation. Pancreas: Within normal limits. Spleen: Spleen is normal in size. Multiple hypoenhancing splenic lesions, including a dominant 13 mm lesion medially (series 2/image 26), although less conspicuous on delayed imaging. Adrenals/Urinary Tract: Adrenal glands are within normal limits. Kidneys are within normal limits.  No hydronephrosis. Bladder is underdistended although unremarkable. Stomach/Bowel: Stomach is within normal limits. No evidence of bowel obstruction. Normal appendix (series 2/image 60). Terminal ileum is within normal limits (series 2/image 56). No colonic wall thickening or inflammatory changes. Mild to moderate left colonic stool burden, suggesting constipation. Vascular/Lymphatic: No evidence of abdominal aortic aneurysm. Mild atherosclerotic calcifications at the origin of the left renal artery. Upper abdominal/retroperitoneal/pelvic lymphadenopathy, including: --13 mm short axis gastrohepatic node (series 2/image 24) --15 mm short axis aortocaval node (series 2/image 31) --19 mm short axis node anterior to the infrahepatic IVC (series 2/image 41) --32 mm short axis jejunal mesenteric nodal mass encasing the SMA (series 2/image 50), partially necrotic --13 mm short axis left obturator node (series  2/image 69) This overall appearance favors lymphoma, particularly the mesenteric nodal mass which encases the SMA. However, partial necrosis is relatively uncommon with lymphoma, and therefore nodal metastases remain within the differential. Reproductive: Uterus is within normal limits. Bilateral ovaries are within normal limits. Other: No abdominopelvic ascites. Musculoskeletal: Degenerative changes of the lower thoracic spine. IMPRESSION: Abdominopelvic lymphadenopathy, including a dominant 3.2 cm short axis jejunal mesentery nodal mass encasing the SMA, partially necrotic. Overall appearance favors lymphoma, although nodal metastases are also possible. Spleen is normal in size. However, hypoenhancing lesions are suspected on the portal venous phase, raising the possibility of lymphomatous involvement. Suspected subcarinal nodal mass, incompletely evaluated. Consider CT chest or PET-CT for further evaluation. Moderate left pleural effusion. Associated left lower lobe opacity,  likely atelectasis. Electronically Signed   By: Julian Hy M.D.   On: 10/29/2018 11:27   Nm Pet Image Initial (pi) Skull Base To Thigh  Result Date: 11/10/2018 CLINICAL DATA:  Initial treatment strategy for acute lymph adenitis. Mesenteric lymphadenopathy. Concern for lymphoma. EXAM: NUCLEAR MEDICINE PET SKULL BASE TO THIGH TECHNIQUE: 7.8 mCi F-18 FDG was injected intravenously. Full-ring PET imaging was performed from the skull base to thigh after the radiotracer. CT data was obtained and used for attenuation correction and anatomic localization. Fasting blood glucose: 70 mg/dl COMPARISON:  CT 10/29/2018 FINDINGS: Mediastinal blood pool activity: SUV max see 0.1 Liver activity: SUV max NA NECK: Several small hypermetabolic lymph nodes in the lower neck. Small lymph node in the LEFT level 3 location beneath the sternocleidomastoid muscle with SUV max equal 10.0. Node difficult to seen on the CT portion Bilateral supraclavicular  nodes are hypermetabolic but relatively small measuring 8-10 mm short axis with SUV max equal 10.2. Hypermetabolic lymph node beneath the medial LEFT pectoralis muscle is also small measuring 9 mm with SUV max equal 7.3 (image 45/4). Incidental CT findings: none CHEST: Intense hypermetabolic subcarinal lymph node with SUV max equal 17.5. Hypermetabolic paratracheal nodes and prevascular nodes. Hypermetabolic LEFT internal mammary node SUV max equal 9.3. Hypermetabolic node in the precordial fat on the LEFT measuring 1.3 cm with SUV max equal 11.4. This may be is a good target lymph node. Incidental CT findings: Moderate layering LEFT effusion. ABDOMEN/PELVIS: Extensive hypermetabolic lymph nodes surrounding the crus of the diaphragm and paraspinal tissues. Masslike adenopathy in the central mesentery measures 3.5 cm in total with SUV max equal 16.6. Hypermetabolic thickening along the LEFT and RIGHT pericolic gutters (image 793 on the LEFT for example). Hypermetabolic lymph nodes extend into the pelvis with hypermetabolic operator nodes. There is a hypermetabolic LEFT external iliac lymph node with SUV max equal 10.9. This node is poorly defined on CT but is favored to measure 1 cm short axis on image 151/4. scattered nodes in the deep peritoneal space. Incidental CT findings: Uterus and ovaries normal SKELETON: No focal hypermetabolic activity to suggest skeletal metastasis. Incidental CT findings: none IMPRESSION: 1. 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 2. Nodal metastasis include the lower neck, mediastinum, mesentery, peritoneum and retroperitoneum, and iliac lymph nodes. 3. Spleen and bone marrow normal. 4. Moderate LEFT pleural effusion. 5. Target lymph nodes for sampling could include the LEFT external iliac lymph node, precordial lymph node in the LEFT upper quadrant, or super clavicular/LEFT sub pectoralis nodes. Electronically Signed   By: Suzy Bouchard  M.D.   On: 11/10/2018 14:15    Labs:  CBC: Recent Labs    10/20/18 1550 11/16/18 1320 11/22/18 1055  WBC 5.1 4.7 3.8*  HGB 11.2* 11.8* 12.4  HCT 34.0* 37.8 38.3  PLT 440.0* 228 319    COAGS: Recent Labs    11/22/18 1142  INR 1.1    BMP: Recent Labs    10/20/18 1550 11/16/18 1320  NA 136 142  K 3.5 3.9  CL 98 105  CO2 29 27  GLUCOSE 100* 85  BUN 14 26*  CALCIUM 9.2 9.2  CREATININE 0.68 0.91  GFRNONAA  --  >60  GFRAA  --  >60    LIVER FUNCTION TESTS: Recent Labs    10/20/18 1550 11/16/18 1320  BILITOT 0.4 0.4  AST 25 18  ALT 21 21  ALKPHOS 64 49  PROT 7.4 6.6  ALBUMIN 3.8 4.0    TUMOR MARKERS: No  results for input(s): AFPTM, CEA, CA199, CHROMGRNA in the last 8760 hours.  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  All of the questions were answered and there is agreement to proceed. Consent signed and in chart.   Thank you for this interesting consult.  I greatly enjoyed meeting Jordayn Mink and look forward to participating in their care.  A copy of this report was sent to the requesting provider on this date.  Electronically Signed: Lavonia Drafts, PA-C 11/22/2018, 12:42 PM   I spent a total of  30 Minutes   in face to face in clinical consultation, greater than 50% of which was counseling/coordinating care for SCLN bx

## 2018-11-22 NOTE — Progress Notes (Signed)
[employer] interpreter used [place]

## 2018-11-22 NOTE — Progress Notes (Signed)
Reviewed discharge instructions with pt and her daughter (via Telephone) voices understanding.

## 2018-11-22 NOTE — Discharge Instructions (Signed)
Biopsia por puncin, cuidados posteriores Needle Biopsy, Care After Esta hoja le brinda informacin sobre cmo cuidarse despus del procedimiento. El mdico tambin podr darle indicaciones ms especficas. Comunquese con su mdico si tiene problemas o preguntas. Qu puedo esperar despus del procedimiento? Despus del procedimiento, es normal tener molestias, hematomas o dolor leve en el lugar de la puncin. Esto debe desaparecer en unos [employer]. Siga estas indicaciones en su casa: Cuidado del lugar de insercin de la [employer] manos con agua y jabn antes de Quarry manager las vendas (vendajes). Use un desinfectante para manos si no dispone de [place] y [place].  Siga las indicaciones del mdico acerca del cuidado del lugar de la puncin. Esto incluye lo siguiente: ? Cundo y cmo [employer]. ? Cundo [employer].  Psychiatric nurse de la puncin todos los das para descartar signos de infeccin. Est atento a los siguientes signos: ? Dolor, hinchazn o enrojecimiento. ? Lquido o sangre. ? Pus o mal olor. ? Calor. Indicaciones generales  Retome sus actividades normales como se lo haya indicado el mdico. Pregntele al mdico qu actividades son seguras para usted.  No tome baos de inmersin, no nade ni use el jacuzzi hasta que el mdico lo autorice. Pregntele al mdico si puede ducharse. Thurston Pounds solo le permitan darse baos de [place].  Tome los medicamentos de venta libre y los recetados solamente como se lo haya indicado el mdico.  Consulting civil engineer a todas las visitas de seguimiento como se lo haya indicado el mdico. Esto es importante. Comunquese con un mdico si:  Tiene fiebre.  Tiene enrojecimiento, hinchazn o Management consultant de la puncin que duran ms de [employer].  Observa lquido, sangre o pus que salen del lugar de la puncin.  El lugar de la puncin se siente caliente al tacto. Solicite ayuda de inmediato si:  Tiene sangrado abundante en el  lugar de la puncin. Resumen  Despus del procedimiento, es normal tener molestias, hematomas o dolor leve en el lugar de la puncin. Esto debe desaparecer en unos [employer].  Controle todos los [employer] lugar de la puncin para Hydrographic surveyor signos de infeccin, como enrojecimiento, hinchazn o Social research officer, government.  Solicite ayuda de inmediato si presenta sangrado intenso proveniente del lugar de la puncin. Esta informacin no tiene Marine scientist el consejo del mdico. Asegrese de hacerle al mdico cualquier pregunta que tenga. Document Released: 09/19/2014 Document Revised: 07/17/2017 Document Reviewed: 07/17/2017 Elsevier Patient Education  2020 [employer].

## 2018-11-22 NOTE — Progress Notes (Signed)
Lab called blood for PT was hemolyzed. Blood redrawn and blood sent to lab

## 2018-11-25 ENCOUNTER — Encounter: Payer: Self-pay | Admitting: *Deleted

## 2018-11-25 ENCOUNTER — Other Ambulatory Visit: Payer: Self-pay | Admitting: Hematology

## 2018-11-25 ENCOUNTER — Other Ambulatory Visit: Payer: Self-pay | Admitting: Gastroenterology

## 2018-11-25 DIAGNOSIS — R591 Generalized enlarged lymph nodes: Secondary | ICD-10-CM

## 2018-11-25 NOTE — Progress Notes (Signed)
Patient's core biopsy non diagnostic. She will need an excisional biopsy. Dr Maylon Peppers reached out to Dr Dalbert Batman for excisional biopsy. Referral placed and sent. Message sent to referral department at Dr Darrel Hoover office. Will also look into the possibility of Dr Dalbert Batman placing port at the same time as biopsy to reduce hospital encounters.  Awaiting response from Dr Darrel Hoover office to confirm appointment and port placement. Patient's daughter informed of referral and possible cancellation of port placement on Monday by IR.  Will follow up on appointments and referrals tomorrow.

## 2018-11-26 ENCOUNTER — Other Ambulatory Visit: Payer: Self-pay | Admitting: Radiology

## 2018-11-26 ENCOUNTER — Encounter: Payer: Self-pay | Admitting: *Deleted

## 2018-11-26 NOTE — Progress Notes (Signed)
Continued communication with Dr Darrel Hoover office today to make new patient appointment and add on port placement to biopsy. At this time, despite multiple conversations we do not have confirmation of port placement with Dr Dalbert Batman, and at this time patients appointment for consultation isn't until 12/07/2018. As such, we will leave the port placement that is already scheduled on Monday with IR.  Dr Maylon Peppers has placed orders for BMBX with Wilber Bihari NP. When speaking to the patient's daughter, I have explained this process and notified her that the [facility] at [facility] will be reaching out to make appointment.  At this time, patient's daughter doesn't have any questions or concerns. Will continue to follow for scheduling of patient's open biopsy with Dr Dalbert Batman. Daughter has my information to contact me as needed.

## 2018-11-29 ENCOUNTER — Ambulatory Visit (HOSPITAL_COMMUNITY)
Admission: RE | Admit: 2018-11-29 | Discharge: 2018-11-29 | Disposition: A | Discharge status: Home / Self Care | Payer: Commercial Managed Care - PPO | Source: Ambulatory Visit | Attending: Hematology | Admitting: Hematology

## 2018-11-29 ENCOUNTER — Other Ambulatory Visit: Payer: Self-pay | Admitting: General Surgery

## 2018-11-29 ENCOUNTER — Other Ambulatory Visit: Payer: Self-pay

## 2018-11-29 ENCOUNTER — Encounter (HOSPITAL_COMMUNITY): Payer: Self-pay

## 2018-11-29 DIAGNOSIS — J90 Pleural effusion, not elsewhere classified: Secondary | ICD-10-CM | POA: Diagnosis not present

## 2018-11-29 DIAGNOSIS — R591 Generalized enlarged lymph nodes: Secondary | ICD-10-CM

## 2018-11-29 DIAGNOSIS — E7800 Pure hypercholesterolemia, unspecified: Secondary | ICD-10-CM | POA: Insufficient documentation

## 2018-11-29 DIAGNOSIS — K5090 Crohn's disease, unspecified, without complications: Secondary | ICD-10-CM | POA: Diagnosis not present

## 2018-11-29 DIAGNOSIS — F419 Anxiety disorder, unspecified: Secondary | ICD-10-CM | POA: Diagnosis not present

## 2018-11-29 DIAGNOSIS — Z79899 Other long term (current) drug therapy: Secondary | ICD-10-CM | POA: Insufficient documentation

## 2018-11-29 DIAGNOSIS — I10 Essential (primary) hypertension: Secondary | ICD-10-CM | POA: Diagnosis not present

## 2018-11-29 HISTORY — PX: IR IMAGING GUIDED PORT INSERTION: IMG5740

## 2018-11-29 LAB — CBC WITH DIFFERENTIAL/PLATELET
Abs Immature Granulocytes: 0.02 10*3/uL (ref 0.00–0.07)
Basophils Absolute: 0 10*3/uL (ref 0.0–0.1)
Basophils Relative: 0 %
Eosinophils Absolute: 0 10*3/uL (ref 0.0–0.5)
Eosinophils Relative: 0 %
HCT: 36.4 % (ref 36.0–46.0)
Hemoglobin: 11.5 g/dL — ABNORMAL LOW (ref 12.0–15.0)
Immature Granulocytes: 0 %
Lymphocytes Relative: 15 %
Lymphs Abs: 0.7 10*3/uL (ref 0.7–4.0)
MCH: 26.4 pg (ref 26.0–34.0)
MCHC: 31.6 g/dL (ref 30.0–36.0)
MCV: 83.5 fL (ref 80.0–100.0)
Monocytes Absolute: 0.4 10*3/uL (ref 0.1–1.0)
Monocytes Relative: 8 %
Neutro Abs: 3.7 10*3/uL (ref 1.7–7.7)
Neutrophils Relative %: 77 %
Platelets: 358 10*3/uL (ref 150–400)
RBC: 4.36 MIL/uL (ref 3.87–5.11)
RDW: 17.7 % — ABNORMAL HIGH (ref 11.5–15.5)
WBC: 4.9 10*3/uL (ref 4.0–10.5)
nRBC: 0 % (ref 0.0–0.2)

## 2018-11-29 LAB — PROTIME-INR
INR: 1 (ref 0.8–1.2)
Prothrombin Time: 13.5 seconds (ref 11.4–15.2)

## 2018-11-29 MED ORDER — HEPARIN SOD (PORK) LOCK FLUSH 100 UNIT/ML IV SOLN
INTRAVENOUS | Status: AC
  Filled 2018-11-29: qty 5

## 2018-11-29 MED ORDER — FENTANYL CITRATE (PF) 100 MCG/2ML IJ SOLN
INTRAMUSCULAR | Status: AC | PRN
  Administered 2018-11-29 (×2): 50 ug via INTRAVENOUS

## 2018-11-29 MED ORDER — FENTANYL CITRATE (PF) 100 MCG/2ML IJ SOLN
INTRAMUSCULAR | Status: AC
  Filled 2018-11-29: qty 2

## 2018-11-29 MED ORDER — SODIUM CHLORIDE 0.9 % IV SOLN
INTRAVENOUS | Status: DC
  Administered 2018-11-29: 14:00:00 via INTRAVENOUS

## 2018-11-29 MED ORDER — CEFAZOLIN SODIUM-DEXTROSE 2-4 GM/100ML-% IV SOLN
INTRAVENOUS | Status: AC
  Administered 2018-11-29: 2 g via INTRAVENOUS
  Filled 2018-11-29: qty 100

## 2018-11-29 MED ORDER — MIDAZOLAM HCL 2 MG/2ML IJ SOLN
INTRAMUSCULAR | Status: AC
  Filled 2018-11-29: qty 4

## 2018-11-29 MED ORDER — LIDOCAINE HCL 1 % IJ SOLN
INTRAMUSCULAR | Status: AC
  Filled 2018-11-29: qty 20

## 2018-11-29 MED ORDER — MIDAZOLAM HCL 2 MG/2ML IJ SOLN
INTRAMUSCULAR | Status: AC | PRN
  Administered 2018-11-29 (×2): 1 mg via INTRAVENOUS

## 2018-11-29 MED ORDER — CEFAZOLIN SODIUM-DEXTROSE 2-4 GM/100ML-% IV SOLN
2.0000 g | INTRAVENOUS | Status: AC
  Administered 2018-11-29: 2 g via INTRAVENOUS

## 2018-11-29 MED ORDER — HEPARIN SOD (PORK) LOCK FLUSH 100 UNIT/ML IV SOLN
INTRAVENOUS | Status: AC | PRN
  Administered 2018-11-29: 500 [IU] via INTRAVENOUS

## 2018-11-29 MED ORDER — LIDOCAINE HCL (PF) 1 % IJ SOLN
INTRAMUSCULAR | Status: AC | PRN
  Administered 2018-11-29: 5 mL

## 2018-11-29 NOTE — Procedures (Signed)
Interventional Radiology Procedure:   Indications: Lymphoproliferative process  Procedure: Port placement  Findings: Right jugular port, tip in lower SVC.  Complications: None     EBL: Minimal, less than 10 ml  Plan: Discharge in one hour.  Keep port site and incisions dry for at least 24 hours.     Denise Walls R. Anselm Pancoast, MD  Pager: [phone]

## 2018-11-29 NOTE — Progress Notes (Signed)
Interpretor used for pre and post IR visit. Denise Walls.

## 2018-11-29 NOTE — H&P (Signed)
Chief Complaint: Patient was seen in consultation today for lymphadenopathy  Referring Physician(s): Zhao,Yan  Supervising Physician: Markus Daft  Patient Status: [facility] - Out-pt  History of Present Illness: Denise Walls is a 58 y.o. female with past medical history of anxiety, Crohn disease, HTN, and recent lymphadenopathy.  She is known to IR from recent lymph node biopsy.  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  She presents today for Port-A-Cath placement at the request of Dr. Maylon Peppers.   Patient assessed today with assistance from [place] interpreter.  She presents in her usual state of health.  Denies fever, chills, cough, shortness of breath, nausea, vomiting, abdominal pain.  She has been NPO.  She does not take blood thinners.   Past Medical History:  Diagnosis Date   Acute cystitis    Anxiety    Arthritis    Crohn disease ([place])    dx 2004, history of small bowel obstruction 02/2007 treated conservatively   History of colon polyps    HTN (hypertension)    Hypercholesterolemia    Migraine    Uterine fibroid     Past Surgical History:  Procedure Laterality Date   COLONOSCOPY  09/11/2014   Small internal hemorrhoids. Otherwise normal colonoscopy to terminal ileum   COLONOSCOPY  10/02/2017   ESOPHAGOGASTRODUODENOSCOPY  04/05/2007   Mild gastritis. Otherwise, normal esophagogastroduodenoscopy   TUBAL LIGATION      Allergies: Nsaids  Medications: Prior to Admission medications   Medication Sig Start Date End Date Taking? Authorizing Provider  amLODipine (NORVASC) 5 MG tablet Take 5 mg by mouth daily.  06/25/18  Yes [provider]  calcium carbonate (TUMS - DOSED IN MG ELEMENTAL CALCIUM) 500 MG chewable tablet Chew 2 tablets by mouth daily as needed for indigestion or heartburn.   Yes [provider]  cholecalciferol (VITAMIN D3) 25 MCG (1000 UT) tablet Take 1,000  Units by mouth daily.   Yes [provider]  hydrochlorothiazide (HYDRODIURIL) 25 MG tablet Take 25 mg by mouth daily.   Yes [provider]  lovastatin (MEVACOR) 40 MG tablet Take 40 mg by mouth daily.  06/25/18  Yes [provider]  Melatonin 3 MG TABS Take 3 mg by mouth at bedtime as needed (sleep).   Yes [provider]  Nutritional Supplements (IMMUNE ENHANCE) TABS Take 1 tablet by mouth daily.   Yes [provider]  OVER THE COUNTER MEDICATION Take 2 tablets by mouth daily as needed (acid reflux). Soothing digestive relief otc   Yes [provider]  polyethylene glycol (MIRALAX / GLYCOLAX) 17 g packet Take 17 g by mouth daily as needed for mild constipation.   Yes [provider]  potassium chloride (KLOR-CON) 8 MEQ tablet Take 8 mEq by mouth daily.  06/27/18  Yes [provider]  mesalamine (LIALDA) 1.2 g EC tablet Take 2.4 g by mouth daily.  05/15/17   [provider]  predniSONE (DELTASONE) 10 MG tablet Take 2 pills by mouth daily for 14 days, take 1 pill daily for 14 days. Patient not taking: Reported on 11/18/2018 10/22/18   Jackquline Denmark, MD  traMADol (ULTRAM) 50 MG tablet Take 1 tablet (50 mg total) by mouth every 8 (eight) hours as needed. Patient taking differently: Take 50 mg by mouth every 8 (eight) hours as needed for moderate pain.  11/16/18   Tish Men, MD     Family History  Problem Relation Age of Onset   Colon  cancer Neg Hx     Social History   Socioeconomic History   Marital status: Married    Spouse name: Not on file   Number of children: Not on file   Years of education: Not on file   Highest education level: Not on file  Occupational History   Not on file  Social Needs   Financial resource strain: Not on file   Food insecurity    Worry: Not on file    Inability: Not on file   Transportation needs    Medical: Not on file    Non-medical: Not on file  Tobacco Use   Smoking  status: Never Smoker   Smokeless tobacco: Never Used  Substance and Sexual Activity   Alcohol use: Never    Frequency: Never   Drug use: Never   Sexual activity: Yes  Lifestyle   Physical activity    Days per week: Not on file    Minutes per session: Not on file   Stress: Not on file  Relationships   Social connections    Talks on phone: Not on file    Gets together: Not on file    Attends religious service: Not on file    Active member of club or organization: Not on file    Attends meetings of clubs or organizations: Not on file    Relationship status: Not on file  Other Topics Concern   Not on file  Social History Narrative   Not on file     Review of Systems: A 12 point ROS discussed and pertinent positives are indicated in the HPI above.  All other systems are negative.  Review of Systems  Constitutional: Negative for fatigue and fever.  Respiratory: Negative for cough and shortness of breath.   Cardiovascular: Negative for chest pain.  Gastrointestinal: Negative for abdominal pain.  Musculoskeletal: Negative for back pain.  Psychiatric/Behavioral: Negative for behavioral problems and confusion.    Vital Signs: BP 130/84 (BP Location: Right Arm)    Pulse 86    Temp 99 F (37.2 C) (Oral)    Resp 18    SpO2 98%   Physical Exam Vitals signs and nursing note reviewed.  Constitutional:      Appearance: Normal appearance.  HENT:     Mouth/Throat:     Mouth: Mucous membranes are moist.     Pharynx: Oropharynx is clear.  Neck:     Musculoskeletal: Normal range of motion and neck supple.     Comments: Puncture site to left neck well-healed. Cardiovascular:     Rate and Rhythm: Normal rate and regular rhythm.  Pulmonary:     Effort: Pulmonary effort is normal. No respiratory distress.     Breath sounds: Normal breath sounds.  Skin:    General: Skin is warm and dry.  Neurological:     General: No focal deficit present.     Mental Status: She is alert  and oriented to person, place, and time. Mental status is at baseline.  Psychiatric:        Mood and Affect: Mood normal.        Behavior: Behavior normal.        Thought Content: Thought content normal.        Judgment: Judgment normal.      MD Evaluation Airway: WNL Heart: WNL Abdomen: WNL Chest/ Lungs: WNL ASA  Classification: 3 Mallampati/Airway Score: One   Imaging: Nm Pet Image Initial (pi) Skull Base To Thigh  Result Date:  11/10/2018 CLINICAL DATA:  Initial treatment strategy for acute lymph adenitis. Mesenteric lymphadenopathy. Concern for lymphoma. EXAM: NUCLEAR MEDICINE PET SKULL BASE TO THIGH TECHNIQUE: 7.8 mCi F-18 FDG was injected intravenously. Full-ring PET imaging was performed from the skull base to thigh after the radiotracer. CT data was obtained and used for attenuation correction and anatomic localization. Fasting blood glucose: 70 mg/dl COMPARISON:  CT 10/29/2018 FINDINGS: Mediastinal blood pool activity: SUV max see 0.1 Liver activity: SUV max NA NECK: Several small hypermetabolic lymph nodes in the lower neck. Small lymph node in the LEFT level 3 location beneath the sternocleidomastoid muscle with SUV max equal 10.0. Node difficult to seen on the CT portion Bilateral supraclavicular nodes are hypermetabolic but relatively small measuring 8-10 mm short axis with SUV max equal 10.2. Hypermetabolic lymph node beneath the medial LEFT pectoralis muscle is also small measuring 9 mm with SUV max equal 7.3 (image 45/4). Incidental CT findings: none CHEST: Intense hypermetabolic subcarinal lymph node with SUV max equal 17.5. Hypermetabolic paratracheal nodes and prevascular nodes. Hypermetabolic LEFT internal mammary node SUV max equal 9.3. Hypermetabolic node in the precordial fat on the LEFT measuring 1.3 cm with SUV max equal 11.4. This may be is a good target lymph node. Incidental CT findings: Moderate layering LEFT effusion. ABDOMEN/PELVIS: Extensive hypermetabolic lymph  nodes surrounding the crus of the diaphragm and paraspinal tissues. Masslike adenopathy in the central mesentery measures 3.5 cm in total with SUV max equal 16.6. Hypermetabolic thickening along the LEFT and RIGHT pericolic gutters (image 191 on the LEFT for example). Hypermetabolic lymph nodes extend into the pelvis with hypermetabolic operator nodes. There is a hypermetabolic LEFT external iliac lymph node with SUV max equal 10.9. This node is poorly defined on CT but is favored to measure 1 cm short axis on image 151/4. scattered nodes in the deep peritoneal space. Incidental CT findings: Uterus and ovaries normal SKELETON: No focal hypermetabolic activity to suggest skeletal metastasis. Incidental CT findings: none IMPRESSION: 1. 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 2. Nodal metastasis include the lower neck, mediastinum, mesentery, peritoneum and retroperitoneum, and iliac lymph nodes. 3. Spleen and bone marrow normal. 4. Moderate LEFT pleural effusion. 5. Target lymph nodes for sampling could include the LEFT external iliac lymph node, precordial lymph node in the LEFT upper quadrant, or super clavicular/LEFT sub pectoralis nodes. Electronically Signed   By: Suzy Bouchard M.D.   On: 11/10/2018 14:15   [place] Core Biopsy (lymph Nodes)  Result Date: 11/22/2018 INDICATION: 58 year old with diffuse lymphadenopathy. Concern for metastasis or lymphoma. Tissue diagnosis is needed. EXAM: ULTRASOUND-GUIDED LEFT SUPRACLAVICULAR LYMPH NODE BIOPSY MEDICATIONS: None. ANESTHESIA/SEDATION: Moderate (conscious) sedation was employed during this procedure. A total of Versed 2.0 mg and Fentanyl 50 mcg was administered intravenously. Moderate Sedation Time: 22 minutes. The patient's level of consciousness and vital signs were monitored continuously by radiology nursing throughout the procedure under my direct supervision. FLUOROSCOPY TIME:  None COMPLICATIONS: None immediate.  PROCEDURE: Informed written consent was obtained from the patient after a thorough discussion of the procedural risks, benefits and alternatives. All questions were addressed. A timeout was performed prior to the initiation of the procedure. Left side of the neck was evaluated with ultrasound. Multiple hypoechoic lymph nodes were identified in the left supraclavicular region. Left side of the neck was prepped with chlorhexidine and sterile field was created. Skin and soft tissues were anesthetized with 1% lidocaine. Using ultrasound guidance, 18 gauge core needle was directed into a dominant lymph node just lateral  to the left internal jugular vein. Core biopsies obtained and placed in saline. Total of 6 ultrasound-guided core biopsies were obtained from this lymph node. Specimens placed in saline. Bandage placed over the puncture site. FINDINGS: Several hypoechoic lymph nodes throughout the left supraclavicular area. Dominant lymph node lateral to the left internal jugular vein was sampled. Needle position was confirmed within the lymph node on all core biopsies. No significant bleeding or hematoma formation at the end of the procedure. IMPRESSION: Ultrasound-guided core biopsy of a left supraclavicular lymph node. Electronically Signed   By: Markus Daft M.D.   On: 11/22/2018 15:29    Labs:  CBC: Recent Labs    10/20/18 1550 11/16/18 1320 11/22/18 1055 11/29/18 1303  WBC 5.1 4.7 3.8* 4.9  HGB 11.2* 11.8* 12.4 11.5*  HCT 34.0* 37.8 38.3 36.4  PLT 440.0* 228 319 358    COAGS: Recent Labs    11/22/18 1142  INR 1.1    BMP: Recent Labs    10/20/18 1550 11/16/18 1320  NA 136 142  K 3.5 3.9  CL 98 105  CO2 29 27  GLUCOSE 100* 85  BUN 14 26*  CALCIUM 9.2 9.2  CREATININE 0.68 0.91  GFRNONAA  --  >60  GFRAA  --  >60    LIVER FUNCTION TESTS: Recent Labs    10/20/18 1550 11/16/18 1320  BILITOT 0.4 0.4  AST 25 18  ALT 21 21  ALKPHOS 64 49  PROT 7.4 6.6  ALBUMIN 3.8 4.0     TUMOR MARKERS: No results for input(s): AFPTM, CEA, CA199, CHROMGRNA in the last 8760 hours.  Assessment and Plan: Patient with past medical history of anxiety, Crohn's disease presents with complaint of lymphadenopathy concerning for B-cell lymphoma.  IR consulted for Port-A-Cath placement at the request of Dr. Maylon Peppers. Case reviewed by Dr. Anselm Pancoast who approves patient for procedure. Also discussed possible additional biopsy with Dr. Maylon Peppers who states patient is planning for upcoming excisional biopsy with surgery.  Patient presents for Port-A-Cath placement today in their usual state of health.  She has been NPO and is not currently on blood thinners.   Risks and benefits of image guided port-a-catheter placement was discussed with the patient including, but not limited to bleeding, infection, pneumothorax, or fibrin sheath development and need for additional procedures.  All of the patient's questions were answered, patient is agreeable to proceed. Consent signed and in chart.  Thank you for this interesting consult.  I greatly enjoyed meeting Yemaya Barnier and look forward to participating in their care.  A copy of this report was sent to the requesting provider on this date.  Electronically Signed: Docia Barrier, PA 11/29/2018, 2:10 PM   I spent a total of    25 Minutes in face to face in clinical consultation, greater than 50% of which was counseling/coordinating care for lymphadenopathy.

## 2018-11-29 NOTE — Discharge Instructions (Signed)
Insercin del dispositivo de perfusin implantable, cuidados posteriores Implanted Port Insertion, Care After [employer] brinda informacin sobre cmo cuidarse despus del procedimiento. El mdico tambin podr darle indicaciones ms especficas. Comunquese con el mdico si tiene problemas o preguntas. Qu puedo esperar despus del procedimiento? Despus del procedimiento, es comn tener los siguientes sntomas:  Chemical engineer de la insercin del dispositivo.  Moretones en la piel alrededor del dispositivo. Esto debera mejorar luego de 3 o 4 das. Siga estas indicaciones en su casa: Cuidado del dispositivo  Luego de que le coloquen el dispositivo, le darn una tarjeta de informacin del fabricante. La tarjeta contiene informacin acerca del dispositivo. Llvela siempre con usted.  Cuide el dispositivo como se lo haya indicado el mdico. Pregntele al mdico si usted o un familiar puede recibir capacitacin para cuidar del dispositivo en casa. Un enfermero a domicilio tambin puede cuidar del dispositivo.  Asegrese de recordar el tipo de dispositivo que tiene. Cuidados de las incisiones      Siga las indicaciones del mdico acerca de los cuidados del lugar de la insercin del dispositivo. Asegrese de hacer lo siguiente: ? Lvese las manos con agua y [place] antes y despus de cambiar la venda (vendaje). Use desinfectante para manos si no dispone de [place] y [place]. ? Cambie el vendaje como se lo haya indicado el mdico. ? No retire los puntos (suturas), la goma para cerrar la piel o las tiras [place]. Es posible que estos cierres cutneos Animal nutritionist en la piel durante 2semanas o ms tiempo. Si los bordes de las tiras adhesivas empiezan a despegarse y Therapist, sports, puede recortar los que estn sueltos. No retire las tiras [facility] por completo a menos que el mdico se lo indique.  Controle TEFL teacher de insercin del dispositivo todos los das para detectar signos de  infeccin. Est atento a los siguientes signos: ? Enrojecimiento, hinchazn o dolor. ? Lquido o sangre. ? Calor. ? Pus o mal olor. Actividad  Retome sus actividades normales como se lo haya indicado el mdico. Pregntele al mdico qu actividades son seguras para usted.  No levante ningn objeto que pese ms de 10libras (4.5kg) o que supere el lmite de peso que le hayan indicado, hasta que el mdico le diga que puede [place]. Indicaciones generales  [employer] de venta libre y los recetados solamente como se lo haya indicado el mdico.  No tome baos de inmersin, no nade ni use el jacuzzi hasta que el mdico lo autorice. Pregntele al mdico si puede ducharse. Thurston Pounds solo le permitan darse baos de [place].  No conduzca durante 24horas si le administraron un sedante durante el procedimiento.  Use un brazalete de alerta mdico en caso de emergencia. Esto permitir que cualquier mdico que lo atienda sepa que tiene un dispositivo.  Concurra a todas las visitas de seguimiento como se lo haya indicado el mdico. Esto es importante. Comunquese con un mdico si:  No puede purgar el dispositivo con solucin salina como se le indic, o no puede extraer sangre del dispositivo.  Tiene fiebre o escalofros.  Tiene enrojecimiento, hinchazn o dolor alrededor del lugar de la insercin del dispositivo.  Le sale lquido o sangre del Environmental consultant de la insercin del dispositivo.  El lugar de la insercin del dispositivo est caliente al tacto.  Tiene pus o percibe mal olor que proviene del lugar de la insercin del dispositivo. Solicite ayuda inmediatamente si:  Siente falta de aire o dolor en el  pecho.  Tiene hemorragia proveniente del lugar donde tiene el dispositivo y no puede controlarla. Resumen  Cuide el dispositivo como se lo haya indicado el mdico. Tenga la tarjeta informacin del fabricante con usted en todo momento.  Cambie el vendaje como se lo haya indicado el  mdico.  Comunquese con un mdico si tiene fiebre o escalofros o si tiene enrojecimiento, hinchazn o dolor alrededor del Environmental consultant de insercin del dispositivo.  Concurra a todas las visitas de seguimiento como se lo haya indicado el mdico. Esta informacin no tiene Marine scientist el consejo del mdico. Asegrese de hacerle al mdico cualquier pregunta que tenga. Document Released: 02/23/2013 Document Revised: 01/07/2018 Document Reviewed: 01/07/2018 Elsevier Patient Education  2020 [place] en los adultos, cuidados posteriores (Moderate Conscious Sedation, Adult, Care After) Estas indicaciones le proporcionan informacin acerca de cmo deber cuidarse despus del procedimiento. El mdico tambin podr darle instrucciones ms especficas. El tratamiento ha sido planificado segn las prcticas mdicas actuales, pero en algunos casos pueden ocurrir problemas. Comunquese con el mdico si tiene algn problema o dudas despus del procedimiento. QU ESPERAR DESPUS DEL PROCEDIMIENTO Despus del procedimiento, es comn:  Sentirse somnoliento durante varias horas.  Sentirse torpe y [employer] de equilibrio durante varias horas.  Perder el sentido de la realidad durante varias horas.  Vomitar si come [employer]. INSTRUCCIONES PARA EL CUIDADO EN EL HOGAR  Durante al menos 24horas despus del procedimiento:  No haga lo siguiente: ? Participar en actividades que impliquen posibles cadas o lesiones. ? Conducir vehculos. ? Operar maquinarias pesadas. ? Beber alcohol. ? Tomar somnferos o medicamentos que causen somnolencia. ? Firmar documentos legales ni tomar [employer]. ? Cuidar a nios por su cuenta.  Hacer reposo. Comida y bebida  Siga la dieta recomendada por el mdico.  Si vomita: ? Pruebe agua, jugo o sopa cuando usted pueda beber sin vomitar. ? Asegrese de no tener nuseas antes de ingerir alimentos  slidos. Instrucciones generales  Permanezca con un adulto responsable hasta que est completamente despierto y consciente.  Tome los medicamentos de venta libre y los recetados solamente como se lo haya indicado el mdico.  Si fuma, no lo haga sin supervisin.  Concurra a todas las visitas de control como se lo haya indicado el mdico. Esto es importante. SOLICITE ATENCIN MDICA SI:  Sigue teniendo nuseas o vomitando.  Tiene sensacin de desvanecimiento.  Le aparece una erupcin cutnea.  Tiene fiebre. SOLICITE ATENCIN MDICA DE INMEDIATO SI:  Tiene dificultad para respirar. Esta informacin no tiene Marine scientist el consejo del mdico. Asegrese de hacerle al mdico cualquier pregunta que tenga. Document Released: 05/10/2013 Document Revised: 01/10/2016 Document Reviewed: 08/25/2015 Elsevier Patient Education  2020 [employer].

## 2018-12-01 ENCOUNTER — Encounter: Payer: Self-pay | Admitting: *Deleted

## 2018-12-01 ENCOUNTER — Other Ambulatory Visit: Payer: Self-pay | Admitting: Hematology

## 2018-12-01 DIAGNOSIS — J90 Pleural effusion, not elsewhere classified: Secondary | ICD-10-CM

## 2018-12-01 NOTE — Progress Notes (Signed)
Originally follow up appointment scheduled for tomorrow, however core biopsy inconclusive and excisional biopsy won't be done until 12/09/18. Will reschedule follow up appointment to 12/14/18. Daughter is aware of this change and message sent to scheduler.   Patient had a conversation with Dr Maylon Peppers previously related to her effusion and was told to let Dr Maylon Peppers know if her breathing became labored. She states she is feeling more short of breath, especially with activity. She would like to proceed with a Thoracentesis. Dr Maylon Peppers notified and order placed. Daughter is aware to expect a phone call to schedule her mom's procedure.

## 2018-12-02 ENCOUNTER — Inpatient Hospital Stay: Payer: Commercial Managed Care - PPO

## 2018-12-02 ENCOUNTER — Inpatient Hospital Stay: Payer: Commercial Managed Care - PPO | Admitting: Hematology

## 2018-12-02 NOTE — Progress Notes (Signed)
INDICATION: lymphoma  Bone Marrow Biopsy and Aspiration Procedure Note   Informed consent was obtained and potential risks including bleeding, infection and pain were reviewed with the patient with Spanish Interpreter Silvia present during patient's entire visit time.  The patient's name, date of birth, identification, consent and allergies were verified prior to the start of procedure and time out was performed.  The left posterior iliac crest was chosen as the site of biopsy.  The skin was prepped with ChloraPrep.   10 cc of 2% lidocaine was used to provide local anaesthesia.   8 cc of bone marrow aspirate was obtained followed by 1cm biopsy.  Pressure was applied to the biopsy site and bandage was placed over the biopsy site. Patient was made to lie on the back for 30 mins prior to discharge.  The procedure was tolerated well. COMPLICATIONS: None BLOOD LOSS: none The patient was discharged home in stable condition with a 1 week follow up to review results.  Patient was provided with post bone marrow biopsy instructions and instructed to call if there was any bleeding or worsening pain.  Specimens sent for flow cytometry, cytogenetics and additional studies.  Signed  C , NP   

## 2018-12-03 ENCOUNTER — Other Ambulatory Visit: Payer: Self-pay

## 2018-12-03 ENCOUNTER — Ambulatory Visit (HOSPITAL_BASED_OUTPATIENT_CLINIC_OR_DEPARTMENT_OTHER)
Admission: RE | Admit: 2018-12-03 | Discharge: 2018-12-03 | Disposition: A | Discharge status: Home / Self Care | Payer: Commercial Managed Care - PPO | Source: Ambulatory Visit | Attending: Hematology | Admitting: Hematology

## 2018-12-03 ENCOUNTER — Inpatient Hospital Stay: Payer: Commercial Managed Care - PPO | Attending: Hematology | Admitting: Adult Health

## 2018-12-03 ENCOUNTER — Inpatient Hospital Stay: Payer: Commercial Managed Care - PPO

## 2018-12-03 VITALS — BP 116/84 | HR 87 | Temp 98.4°F | Resp 16

## 2018-12-03 DIAGNOSIS — D649 Anemia, unspecified: Secondary | ICD-10-CM | POA: Insufficient documentation

## 2018-12-03 DIAGNOSIS — R591 Generalized enlarged lymph nodes: Secondary | ICD-10-CM

## 2018-12-03 DIAGNOSIS — C8590 Non-Hodgkin lymphoma, unspecified, unspecified site: Secondary | ICD-10-CM

## 2018-12-03 DIAGNOSIS — J910 Malignant pleural effusion: Secondary | ICD-10-CM | POA: Diagnosis not present

## 2018-12-03 DIAGNOSIS — C8339 Diffuse large B-cell lymphoma, extranodal and solid organ sites: Secondary | ICD-10-CM | POA: Diagnosis not present

## 2018-12-03 DIAGNOSIS — R110 Nausea: Secondary | ICD-10-CM | POA: Insufficient documentation

## 2018-12-03 LAB — CMP (CANCER CENTER ONLY)
ALT: 13 U/L (ref 0–44)
AST: 17 U/L (ref 15–41)
Albumin: 3 g/dL — ABNORMAL LOW (ref 3.5–5.0)
Alkaline Phosphatase: 66 U/L (ref 38–126)
Anion gap: 11 (ref 5–15)
BUN: 11 mg/dL (ref 6–20)
CO2: 25 mmol/L (ref 22–32)
Calcium: 8.6 mg/dL — ABNORMAL LOW (ref 8.9–10.3)
Chloride: 101 mmol/L (ref 98–111)
Creatinine: 0.75 mg/dL (ref 0.44–1.00)
GFR, Est AFR Am: >60 mL/min (ref >60)
GFR, Estimated: >60 mL/min (ref >60)
Glucose, Bld: 136 mg/dL — ABNORMAL HIGH (ref 70–99)
Potassium: 3.6 mmol/L (ref 3.5–5.1)
Sodium: 137 mmol/L (ref 135–145)
Total Bilirubin: 0.3 mg/dL (ref 0.3–1.2)
Total Protein: 6.6 g/dL (ref 6.5–8.1)

## 2018-12-03 LAB — CBC WITH DIFFERENTIAL (CANCER CENTER ONLY)
Abs Immature Granulocytes: 0.02 10*3/uL (ref 0.00–0.07)
Basophils Absolute: 0 10*3/uL (ref 0.0–0.1)
Basophils Relative: 1 %
Eosinophils Absolute: 0 10*3/uL (ref 0.0–0.5)
Eosinophils Relative: 0 %
HCT: 36.6 % (ref 36.0–46.0)
Hemoglobin: 11.6 g/dL — ABNORMAL LOW (ref 12.0–15.0)
Immature Granulocytes: 0 %
Lymphocytes Relative: 13 %
Lymphs Abs: 0.6 10*3/uL — ABNORMAL LOW (ref 0.7–4.0)
MCH: 25.8 pg — ABNORMAL LOW (ref 26.0–34.0)
MCHC: 31.7 g/dL (ref 30.0–36.0)
MCV: 81.3 fL (ref 80.0–100.0)
Monocytes Absolute: 0.3 10*3/uL (ref 0.1–1.0)
Monocytes Relative: 7 %
Neutro Abs: 3.8 10*3/uL (ref 1.7–7.7)
Neutrophils Relative %: 79 %
Platelet Count: 354 10*3/uL (ref 150–400)
RBC: 4.5 MIL/uL (ref 3.87–5.11)
RDW: 17.2 % — ABNORMAL HIGH (ref 11.5–15.5)
WBC Count: 4.8 10*3/uL (ref 4.0–10.5)
nRBC: 0 % (ref 0.0–0.2)

## 2018-12-03 LAB — ECHOCARDIOGRAM COMPLETE

## 2018-12-03 LAB — LACTATE DEHYDROGENASE: LDH: 664 U/L — ABNORMAL HIGH (ref 98–192)

## 2018-12-03 NOTE — Patient Instructions (Signed)
Aspiracin y biopsia de mdula sea, en adultos Bone Marrow Aspiration and Bone Marrow Biopsy, Adult La aspiracin y biopsia de mdula sea son procedimientos que se realizan para diagnosticar trastornos sanguneos. Tambin puede realizarse uno de estos procedimientos para ayudar a Community education officer o determinadas infecciones. La mdula sea es el tejido blando que est dentro de los [place]. La mdula sea es la encargada de producir las clulas sanguneas. Para realizar una aspiracin de mdula sea, se toma [place] de tejido lquido del interior del [place]. Para realizar una biopsia de mdula sea, se extrae una pequea muestra de tejido slido de la mdula sea. Estas muestras se examinan con un microscopio o se analizan en un laboratorio. Tal vez deba someterse a estos procedimientos si los resultados de sus hemogramas completos [facility]) son anormales. Generalmente, la muestra de la aspiracin o la biopsia de mdula sea se toma de la parte superior del hueso de la cadera. A veces, la muestra de la aspiracin se toma del hueso del pecho (esternn). Informe al mdico acerca de lo siguiente:  Cualquier alergia que tenga.  Todos los [employer], incluidos vitaminas, hierbas, gotas oftlmicas, cremas y medicamentos de venta libre.  Cualquier problema previo que usted o algn miembro de su familia haya tenido con los anestsicos.  Enfermedades de la sangre o los huesos que tenga.  Cirugas a las que se haya sometido.  Cualquier afeccin mdica que tenga.  Si est embarazada o podra estarlo. Cules son los riesgos? En general, se trata de un procedimiento seguro. Sin embargo, pueden ocurrir complicaciones, por ejemplo:  Infeccin.  Sangrado.  Dolor persistente despus del procedimiento.  Rotura (fractura) del hueso.  Reacciones alrgicas a los medicamentos. Qu ocurre antes del procedimiento? Hidratacin Siga las indicaciones del mdico acerca de mantenerse hidratado,  las cuales pueden incluir lo siguiente:  Hasta 2horas antes del procedimiento, puede beber lquidos claros, como agua, jugos de fruta sin pulpa, caf negro y t solo.  Restricciones en las comidas y bebidas Siga las instrucciones del mdico respecto de las restricciones en [place] cuales pueden incluir lo siguiente:  8 horas antes del procedimiento, no coma alimentos pesados, por ejemplo, carnes, alimentos con alto contenido graso o fritos.  6 horas antes del procedimiento, deje de ingerir comidas o alimentos livianos, como tostadas o cereales.  6 horas antes del procedimiento, deje de tomar leche o bebidas que [employer].  2 horas antes del procedimiento, deje de beber lquidos claros. Medicamentos  Consulte al mdico sobre: ? Quarry manager o suspender los medicamentos que toma habitualmente. Esto es muy importante si toma medicamentos para la diabetes o anticoagulantes. ? Tomar medicamentos como aspirina e ibuprofeno. Estos medicamentos pueden tener un efecto anticoagulante en la [place]. No tome estos medicamentos a menos que el mdico se lo indique. ? Tomar medicamentos de [employer], vitaminas, hierbas y suplementos. Indicaciones generales  Haga que alguien lo lleve a su casa desde el hospital o la clnica.  Si se ir a su casa inmediatamente despus del procedimiento, planifique que alguien se quede con usted durante 24horas.  Pregntele al mdico cmo se marcar o Museum/gallery curator de la Leisure centre manager.  Pregntele al mdico qu medidas se tomarn para ayudar a prevenir una infeccin. Estas medidas pueden incluir: ? Rasurar el vello del lugar de la ciruga. ? Lavar la piel con un jabn antisptico. ? Suministrar antibiticos. Qu ocurre durante el procedimiento?   Pueden colocarle una va intravenosa en una vena.  Le administrarn  uno o ms de los siguientes medicamentos: ? Un medicamento para ayudarlo a relajarse (sedante). ? Un medicamento para  adormecer la zona (anestesia local). ? Un medicamento que lo har dormir (anestesia general).  La muestra de mdula sea se tomar de la siguiente forma: ? Para una aspiracin, se insertar una aguja hueca a travs de Secretary/administrator al [employer]. Se recoger lquido de la mdula sea en [place]. ? Para una biopsia, el mdico usar una aguja hueca para extraer una pequea muestra de tejido slido de la mdula sea.  Se retirar la aguja.  El lquido o tejido de la mdula sea se enviar a un laboratorio para ser examinado.  Se colocar un apsito (vendaje) sobre TEFL teacher de la insercin y se pegar con Ferne Reus. Este procedimiento puede variar segn el mdico y el hospital. Sander Nephew ocurre despus del procedimiento?  Le controlarn la presin arterial, la frecuencia cardaca, la frecuencia respiratoria y Retail buyer de oxgeno en la sangre hasta que le den el alta del hospital o la clnica.  Se extraer la va intravenosa, y se controlar que no haya sangrado en TEFL teacher de la insercin.  No conduzca durante 24horas si le administraron un sedante durante el procedimiento. Resumen  La aspiracin y biopsia de mdula sea son procedimientos que se realizan para diagnosticar trastornos sanguneos. Tambin puede realizarse uno de estos procedimientos para ayudar a Community education officer o determinadas infecciones.  Antes del procedimiento, informe a su mdico todos los medicamentos que [place], incluso vitaminas, hierbas, gotas oftlmicas, cremas y medicamentos de venta libre.  Durante el procedimiento de aspiracin, se toma una muestra de tejido lquido del interior del [place]. Para realizar una biopsia de mdula sea, se extrae una pequea muestra de tejido slido de la mdula sea.  Despus del procedimiento, lo controlarn y verificarn si tiene sangrado.  Haga que alguien lo lleve a su casa desde el hospital o la clnica. Esta informacin no tiene Marine scientist el consejo del  mdico. Asegrese de hacerle al mdico cualquier pregunta que tenga. Document Released: 08/30/2012 Document Revised: 09/28/2017 Document Reviewed: 10/17/2015 Elsevier Patient Education  2020 [employer].

## 2018-12-03 NOTE — Progress Notes (Signed)
  Echocardiogram 2D Echocardiogram has been performed.  Denise Walls 12/03/2018, 4:11 PM

## 2018-12-03 NOTE — Progress Notes (Signed)
Pt discharged in stable condition, VS WNL.  Site WNL, bandage clean & intact. After care instructions reviewed with pt, interpreter present.  Pt verbalizes understanding.

## 2018-12-06 ENCOUNTER — Other Ambulatory Visit (HOSPITAL_COMMUNITY)
Admission: RE | Admit: 2018-12-06 | Discharge: 2018-12-06 | Disposition: A | Discharge status: Home / Self Care | Payer: Commercial Managed Care - PPO | Source: Ambulatory Visit | Attending: General Surgery | Admitting: General Surgery

## 2018-12-06 DIAGNOSIS — Z1159 Encounter for screening for other viral diseases: Secondary | ICD-10-CM | POA: Diagnosis present

## 2018-12-06 LAB — SARS CORONAVIRUS 2 (TAT 6-24 HRS): SARS Coronavirus 2: NEGATIVE

## 2018-12-07 NOTE — H&P (Addendum)
Denise Walls Location: [facility] Patient #: [id number] DOB: 1961/02/25 Married / Language:hispanic Female       History of Present Illness  The patient is a 58 year old female who presents with a complaint of lymphadenopathy. This is a 58 year old Hispanic female. Speaks English poorly. Her daughter is with her who speaks [place] fluently and has good medical insight.  she is referred by [name] for lymph node biopsy to clarify what is felt to be a lymphoma. Denise Walls is her PCP. [name] is her gastroenterologist. She has chronic Crohn's disease. She developed some left lower quadrant pain, anorexia and weight loss but no fever chills or night sweats. Last colonoscopy 2019 was unremarkable. CT scan showed left pleural effusion and diffuse abdominal lymphadenopathy and subcarinal lymphadenopathy. The largest abdominal lymph node was 3.20 cm involving the mesenteric nodal mass encasing the SMA. Subsequent PET/CT showed widespread avid lymphadenopathy. Most of this was in the chest and abdomen. Nothing in the inguinal areas. Nothing in the axilla. There is a suggestion of some small avid nodes in the left supraclavicular area and behind the sternocleidomastoid muscle. Nothing bulky. Interventional radiology biopsied a lymph node in the left supraclavicular area percutaneously and that showed atypical lymphoid proliferation but not diagnostic. She's had a bone marrow biopsy which is negative. She's had a port placed by interventional radiology. She is scheduled for left neck lymph node biopsy by me in 2 days and she is here today for exam  On exam there is a tiny lymph node in the supraclavicular area on the left. There may be a lymph node higher in the left neck. There may be a small lymph node lateral to the right sternocleidomastoid muscle. The port is in the right internal jugular vein with the port in the right side infraclavicular area.  We are going to  go ahead with left neck exploration day after tomorrow. With neck extension extending the head to the right I can probably make a collar incision which will allow me to go down to the supraclavicular area and if I need to go up behind the sternocleidomastoid muscle I can do that. I discussed the indications, techniques, and risks with the patient and her daughter. They're aware the risks of bleeding, infection, rarely injury to the spinal accessory nerve with chronic shoulder disability and pain. Possibility of not being able to get a diagnosis. If we cannot get a diagnosis in the neck then she will need to have an abdominal operation in the future to get more tissue. She understands all of these issues. All questions are answered. She agrees with this plan.    Diagnostic Studies History  Colonoscopy  1-5 years ago Mammogram  within last year Pap Smear  1-5 years ago  Allergies NSAIDs   Medication History  hydroCHLOROthiazide (25MG Tablet, Oral) Active. Potassium Chloride ER (8MEQ Tablet ER, Oral) Active. Medications Reconciled  Social History  Caffeine use  Carbonated beverages. No alcohol use  No drug use  Tobacco use  Never smoker.  Family History Heart Disease  Father. Respiratory Condition  Father, Mother.  Pregnancy / Birth History Age at menarche  25 years. Age of menopause  70-50 Contraceptive History  Oral contraceptives. Gravida  3 Maternal age  24-20 Para  3  Other Problems  Arthritis  Crohn's Disease  High blood pressure     Review of Systems  General Present- Fatigue and Weight Loss. Not Present- Appetite Loss, Chills, Fever, Night Sweats and Weight  Gain. Skin Present- Dryness. Not Present- Change in Wart/Mole, Hives, Jaundice, New Lesions, Non-Healing Wounds, Rash and Ulcer. HEENT Present- Wears glasses/contact lenses. Not Present- Earache, Hearing Loss, Hoarseness, Nose Bleed, Oral Ulcers, Ringing in the Ears, Seasonal  Allergies, Sinus Pain, Sore Throat, Visual Disturbances and Yellow Eyes. Respiratory Present- Difficulty Breathing. Not Present- Bloody sputum, Chronic Cough, Snoring and Wheezing. Cardiovascular Present- Chest Pain, Difficulty Breathing Lying Down and Shortness of Breath. Not Present- Leg Cramps, Palpitations, Rapid Heart Rate and Swelling of Extremities. Gastrointestinal Not Present- Abdominal Pain, Bloating, Bloody Stool, Change in Bowel Habits, Chronic diarrhea, Constipation, Difficulty Swallowing, Excessive gas, Gets full quickly at meals, Hemorrhoids, Indigestion, Nausea, Rectal Pain and Vomiting. Female Genitourinary Not Present- Frequency, Nocturia, Painful Urination, Pelvic Pain and Urgency. Musculoskeletal Not Present- Back Pain, Joint Pain, Joint Stiffness, Muscle Pain, Muscle Weakness and Swelling of Extremities. Neurological Present- Weakness. Not Present- Decreased Memory, Fainting, Headaches, Numbness, Seizures, Tingling, Tremor and Trouble walking. Psychiatric Present- Anxiety and Change in Sleep Pattern. Not Present- Bipolar, Depression, Fearful and Frequent crying. Endocrine Not Present- Cold Intolerance, Excessive Hunger, Hair Changes, Heat Intolerance, Hot flashes and New Diabetes.  Vitals \ Weight: 134 lb Height: 60in Body Surface Area: 1.57 m Body Mass Index: 26.17 kg/m  Temp.: 97.26F (Oral)  Pulse: 86 (Regular)  BP: 124/80(Sitting, Left Arm, Standard)       Physical Exam Renelda Loma M. Dalbert Batman MD; 12/07/2018 1:45 PM) General Mental Status-Alert. General Appearance-Consistent with stated age. Hydration-Well hydrated. Voice-Normal.  Head and Neck Head-normocephalic, atraumatic with no lesions or palpable masses. Trachea-midline. Thyroid Gland Characteristics - normal size and consistency.  Eye Eyeball - Bilateral-Extraocular movements intact. Sclera/Conjunctiva - Bilateral-No scleral icterus.  Chest and Lung Exam Chest and lung exam  reveals -quiet, even and easy respiratory effort with no use of accessory muscles and on auscultation, normal breath sounds, no adventitious sounds and normal vocal resonance. Inspection Chest Wall - Normal. Back - normal.  Cardiovascular Cardiovascular examination reveals -normal heart sounds, regular rate and rhythm with no murmurs and normal pedal pulses bilaterally.  Abdomen Inspection Inspection of the abdomen reveals - No Hernias. Skin - Scar - no surgical scars. Palpation/Percussion Palpation and Percussion of the abdomen reveal - Soft, Non Tender, No Rebound tenderness, No Rigidity (guarding) and No hepatosplenomegaly. Auscultation Auscultation of the abdomen reveals - Bowel sounds normal. Note: No palpable mass   Neurologic Neurologic evaluation reveals -alert and oriented x 3 with no impairment of recent or remote memory. Mental Status-Normal.  Musculoskeletal Normal Exam - Left-Upper Extremity Strength Normal and Lower Extremity Strength Normal. Normal Exam - Right-Upper Extremity Strength Normal and Lower Extremity Strength Normal.  Lymphatic Note: Careful lymphatic exam standing and supine multiple positions. Small lymph node left supraclavicular area no hematoma there. Maybe a small lymph node higher in the left neck posteriorly behind the sternocleidomastoid. Possibly a small lymph node on the right behind the sternocleidomastoid. No bulky adenopathy. Port right infraclavicular area with catheter going up into the right internal jugular vein. No significant axillary or inguinal adenopathy.     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  Head and lots of blood work and numerous tests The tests strongly suggest that you have some type of lymphoma but the biopsies that have been done do not tell us the specific type Treatment cannot be started until  your oncologist knows the specific type You are scheduled for left neck lymph node biopsy  under general anesthesia 2 days from now, July 23  I discussed the indications, techniques, and numerous risk of the surgery with you and your daughter  There is a small possibility that the lymph nodes will not be big enough to make a diagnosis In that case you might have to have an abdominal operation at a later date. Hopefully not   CROHN'S DISEASE IN REMISSION (K50.90)  HYPERTENSION, ESSENTIAL, BENIGN (I10)   Myrissa Chipley M. Dalbert Batman, M.D., [facility] General and Minimally invasive Surgery Breast and Colorectal Surgery Office:   [phone] Pager:   [phone]

## 2018-12-08 ENCOUNTER — Other Ambulatory Visit: Payer: Self-pay

## 2018-12-08 ENCOUNTER — Ambulatory Visit (HOSPITAL_COMMUNITY)
Admission: RE | Admit: 2018-12-08 | Discharge: 2018-12-08 | Disposition: A | Discharge status: Home / Self Care | Payer: Commercial Managed Care - PPO | Source: Ambulatory Visit | Attending: Radiology | Admitting: Radiology

## 2018-12-08 ENCOUNTER — Ambulatory Visit (HOSPITAL_COMMUNITY)
Admission: RE | Admit: 2018-12-08 | Discharge: 2018-12-08 | Disposition: A | Discharge status: Home / Self Care | Payer: Commercial Managed Care - PPO | Source: Ambulatory Visit | Attending: Hematology | Admitting: Hematology

## 2018-12-08 ENCOUNTER — Encounter (HOSPITAL_COMMUNITY): Payer: Self-pay | Admitting: *Deleted

## 2018-12-08 ENCOUNTER — Ambulatory Visit (HOSPITAL_COMMUNITY): Payer: Commercial Managed Care - PPO

## 2018-12-08 DIAGNOSIS — J90 Pleural effusion, not elsewhere classified: Secondary | ICD-10-CM | POA: Diagnosis present

## 2018-12-08 DIAGNOSIS — C8330 Diffuse large B-cell lymphoma, unspecified site: Secondary | ICD-10-CM | POA: Insufficient documentation

## 2018-12-08 DIAGNOSIS — Z9889 Other specified postprocedural states: Secondary | ICD-10-CM

## 2018-12-08 DIAGNOSIS — J9811 Atelectasis: Secondary | ICD-10-CM | POA: Insufficient documentation

## 2018-12-08 LAB — BODY FLUID CELL COUNT WITH DIFFERENTIAL
Eos, Fluid: 0 %
Lymphs, Fluid: 76 %
Monocyte-Macrophage-Serous Fluid: 22 % — ABNORMAL LOW (ref 50–90)
Neutrophil Count, Fluid: 2 % (ref 0–25)
Total Nucleated Cell Count, Fluid: 1139 cu mm — ABNORMAL HIGH (ref 0–1000)

## 2018-12-08 MED ORDER — LIDOCAINE HCL 1 % IJ SOLN
INTRAMUSCULAR | Status: AC
  Filled 2018-12-08: qty 10

## 2018-12-08 NOTE — Progress Notes (Signed)
Spanish speaking patient. Used Astronomer # [phone] Denise Walls  Patient denies shortness of breath, fever, cough and chest pain.  PCP - Dr Marcello Moores [email or web address] Cardiologist - Denies Oncology- Dr [facility] [email or web address] [email or web address]   Chest x-ray - Denies EKG - DOS 12/09/18 Stress Test - Denies ECHO - 12/03/18 Cardiac Cath - Denies  ERAS: Clears til 9:30 am DOS, No drink  Anesthesia review: No  STOP now taking any Aspirin (unless otherwise instructed by your surgeon), Aleve, Naproxen, Ibuprofen, Motrin, Advil, Goody's, BC's, all herbal medications, fish oil, and all vitamins.  Coronavirus Screening Have you or your daughter experienced the following symptoms:  Cough yes/no: No Fever (>100.32F)  yes/no: No Runny nose yes/no: No Sore throat yes/no: No Difficulty breathing/shortness of breath  yes/no: No  Have you or your daughter traveled in the last 14 days and where? yes/no: No

## 2018-12-08 NOTE — Procedures (Signed)
Ultrasound-guided diagnostic and therapeutic left thoracentesis performed yielding 1.2 liters of turbid, yellow fluid. No immediate complications. Follow-up chest x-ray pending. The fluid was sent to the lab for preordered studies. EBL< 1 cc.

## 2018-12-09 ENCOUNTER — Other Ambulatory Visit: Payer: Self-pay

## 2018-12-09 ENCOUNTER — Encounter (HOSPITAL_COMMUNITY)
Admission: RE | Disposition: A | Discharge status: Home / Self Care | Payer: Self-pay | Source: Home / Self Care | Attending: General Surgery

## 2018-12-09 ENCOUNTER — Encounter (HOSPITAL_COMMUNITY): Payer: Self-pay | Admitting: Orthopedic Surgery

## 2018-12-09 ENCOUNTER — Ambulatory Visit (HOSPITAL_COMMUNITY): Payer: Commercial Managed Care - PPO | Admitting: Certified Registered Nurse Anesthetist

## 2018-12-09 ENCOUNTER — Ambulatory Visit (HOSPITAL_COMMUNITY)
Admission: RE | Admit: 2018-12-09 | Discharge: 2018-12-09 | Disposition: A | Discharge status: Home / Self Care | Payer: Commercial Managed Care - PPO | Attending: General Surgery | Admitting: General Surgery

## 2018-12-09 DIAGNOSIS — I10 Essential (primary) hypertension: Secondary | ICD-10-CM | POA: Insufficient documentation

## 2018-12-09 DIAGNOSIS — Z79899 Other long term (current) drug therapy: Secondary | ICD-10-CM | POA: Diagnosis not present

## 2018-12-09 DIAGNOSIS — K5090 Crohn's disease, unspecified, without complications: Secondary | ICD-10-CM | POA: Insufficient documentation

## 2018-12-09 DIAGNOSIS — R591 Generalized enlarged lymph nodes: Secondary | ICD-10-CM

## 2018-12-09 DIAGNOSIS — C8591 Non-Hodgkin lymphoma, unspecified, lymph nodes of head, face, and neck: Secondary | ICD-10-CM | POA: Diagnosis present

## 2018-12-09 DIAGNOSIS — C8331 Diffuse large B-cell lymphoma, lymph nodes of head, face, and neck: Secondary | ICD-10-CM | POA: Insufficient documentation

## 2018-12-09 DIAGNOSIS — C8330 Diffuse large B-cell lymphoma, unspecified site: Secondary | ICD-10-CM | POA: Diagnosis present

## 2018-12-09 HISTORY — DX: Insomnia, unspecified: G47.00

## 2018-12-09 HISTORY — PX: LYMPH NODE BIOPSY: SHX201

## 2018-12-09 HISTORY — DX: Personal history of other infectious and parasitic diseases: Z86.19

## 2018-12-09 LAB — TRIGLYCERIDES, BODY FLUIDS: Triglycerides, Fluid: 320 mg/dL

## 2018-12-09 SURGERY — LYMPH NODE BIOPSY
Anesthesia: General | Site: Neck | Laterality: Left

## 2018-12-09 MED ORDER — SODIUM CHLORIDE 0.9 % IV SOLN: INTRAVENOUS | Status: DC | PRN

## 2018-12-09 MED ORDER — ONDANSETRON HCL 4 MG/2ML IJ SOLN
INTRAMUSCULAR | Status: AC
  Filled 2018-12-09: qty 2

## 2018-12-09 MED ORDER — 0.9 % SODIUM CHLORIDE (POUR BTL) OPTIME
TOPICAL | Status: DC | PRN
  Administered 2018-12-09: 12:00:00 1000 mL

## 2018-12-09 MED ORDER — LIDOCAINE 2% (20 MG/ML) 5 ML SYRINGE
INTRAMUSCULAR | Status: DC | PRN
  Administered 2018-12-09: 100 mg via INTRAVENOUS

## 2018-12-09 MED ORDER — BUPIVACAINE-EPINEPHRINE 0.25% -1:200000 IJ SOLN
INTRAMUSCULAR | Status: DC | PRN
  Administered 2018-12-09: 4 mL

## 2018-12-09 MED ORDER — CELECOXIB 200 MG PO CAPS
200.0000 mg | ORAL_CAPSULE | ORAL | Status: DC
  Filled 2018-12-09: qty 1

## 2018-12-09 MED ORDER — FENTANYL CITRATE (PF) 250 MCG/5ML IJ SOLN
INTRAMUSCULAR | Status: DC | PRN
  Administered 2018-12-09 (×5): 50 ug via INTRAVENOUS

## 2018-12-09 MED ORDER — CEFAZOLIN SODIUM-DEXTROSE 2-4 GM/100ML-% IV SOLN
2.0000 g | INTRAVENOUS | Status: AC
  Administered 2018-12-09: 2 g via INTRAVENOUS
  Filled 2018-12-09: qty 100

## 2018-12-09 MED ORDER — ONDANSETRON HCL 4 MG/2ML IJ SOLN
4.0000 mg | Freq: Once | INTRAMUSCULAR | Status: AC
  Administered 2018-12-09: 4 mg via INTRAVENOUS

## 2018-12-09 MED ORDER — PHENYLEPHRINE 40 MCG/ML (10ML) SYRINGE FOR IV PUSH (FOR BLOOD PRESSURE SUPPORT)
PREFILLED_SYRINGE | INTRAVENOUS | Status: DC | PRN
  Administered 2018-12-09 (×2): 80 ug via INTRAVENOUS

## 2018-12-09 MED ORDER — DEXAMETHASONE SODIUM PHOSPHATE 10 MG/ML IJ SOLN
INTRAMUSCULAR | Status: DC | PRN
  Administered 2018-12-09: 5 mg via INTRAVENOUS

## 2018-12-09 MED ORDER — CHLORHEXIDINE GLUCONATE CLOTH 2 % EX PADS: 6.0000 | MEDICATED_PAD | Freq: Once | CUTANEOUS | Status: DC

## 2018-12-09 MED ORDER — FENTANYL CITRATE (PF) 250 MCG/5ML IJ SOLN
INTRAMUSCULAR | Status: AC
  Filled 2018-12-09: qty 5

## 2018-12-09 MED ORDER — MIDAZOLAM HCL 2 MG/2ML IJ SOLN
INTRAMUSCULAR | Status: DC | PRN
  Administered 2018-12-09: 2 mg via INTRAVENOUS

## 2018-12-09 MED ORDER — BUPIVACAINE-EPINEPHRINE (PF) 0.25% -1:200000 IJ SOLN
INTRAMUSCULAR | Status: AC
  Filled 2018-12-09: qty 30

## 2018-12-09 MED ORDER — GABAPENTIN 300 MG PO CAPS
300.0000 mg | ORAL_CAPSULE | ORAL | Status: AC
  Administered 2018-12-09: 300 mg via ORAL
  Filled 2018-12-09: qty 1

## 2018-12-09 MED ORDER — FENTANYL CITRATE (PF) 100 MCG/2ML IJ SOLN: 25.0000 ug | INTRAMUSCULAR | Status: DC | PRN

## 2018-12-09 MED ORDER — LACTATED RINGERS IV SOLN
INTRAVENOUS | Status: DC
  Administered 2018-12-09: 11:00:00 via INTRAVENOUS

## 2018-12-09 MED ORDER — MIDAZOLAM HCL 2 MG/2ML IJ SOLN
INTRAMUSCULAR | Status: AC
  Filled 2018-12-09: qty 2

## 2018-12-09 MED ORDER — HYDROCODONE-ACETAMINOPHEN 5-325 MG PO TABS: 1.0000 | ORAL_TABLET | Freq: Four times a day (QID) | ORAL | 0 refills | Status: DC | PRN

## 2018-12-09 MED ORDER — DEXAMETHASONE SODIUM PHOSPHATE 10 MG/ML IJ SOLN
INTRAMUSCULAR | Status: AC
  Filled 2018-12-09: qty 1

## 2018-12-09 MED ORDER — ACETAMINOPHEN 500 MG PO TABS
1000.0000 mg | ORAL_TABLET | ORAL | Status: AC
  Administered 2018-12-09: 11:00:00 1000 mg via ORAL
  Filled 2018-12-09: qty 2

## 2018-12-09 MED ORDER — SODIUM CHLORIDE 0.9% FLUSH: 3.0000 mL | Freq: Two times a day (BID) | INTRAVENOUS | Status: DC

## 2018-12-09 MED ORDER — PROPOFOL 10 MG/ML IV BOLUS
INTRAVENOUS | Status: DC | PRN
  Administered 2018-12-09: 130 mg via INTRAVENOUS

## 2018-12-09 MED ORDER — ONDANSETRON HCL 4 MG/2ML IJ SOLN
INTRAMUSCULAR | Status: DC | PRN
  Administered 2018-12-09: 4 mg via INTRAVENOUS

## 2018-12-09 SURGICAL SUPPLY — 32 items
CHLORAPREP W/TINT 26 (MISCELLANEOUS) ×3 IMPLANT
CONT SPEC 4OZ CLIKSEAL STRL BL (MISCELLANEOUS) ×3 IMPLANT
COVER SURGICAL LIGHT HANDLE (MISCELLANEOUS) ×3 IMPLANT
COVER WAND RF STERILE (DRAPES) IMPLANT
DECANTER SPIKE VIAL GLASS SM (MISCELLANEOUS) ×3 IMPLANT
DERMABOND ADVANCED (GAUZE/BANDAGES/DRESSINGS) ×2
DERMABOND ADVANCED .7 DNX12 (GAUZE/BANDAGES/DRESSINGS) ×1 IMPLANT
DRAPE LAPAROTOMY 100X72 PEDS (DRAPES) ×3 IMPLANT
ELECT REM PT RETURN 9FT ADLT (ELECTROSURGICAL) ×3
ELECTRODE REM PT RTRN 9FT ADLT (ELECTROSURGICAL) ×1 IMPLANT
GLOVE EUDERMIC 7 POWDERFREE (GLOVE) ×3 IMPLANT
GOWN STRL REUS W/ TWL LRG LVL3 (GOWN DISPOSABLE) ×1 IMPLANT
GOWN STRL REUS W/ TWL XL LVL3 (GOWN DISPOSABLE) ×1 IMPLANT
GOWN STRL REUS W/TWL LRG LVL3 (GOWN DISPOSABLE) ×2
GOWN STRL REUS W/TWL XL LVL3 (GOWN DISPOSABLE) ×2
KIT BASIN OR (CUSTOM PROCEDURE TRAY) ×3 IMPLANT
KIT TURNOVER KIT B (KITS) ×3 IMPLANT
LOOP VESSEL MINI RED (MISCELLANEOUS) ×3 IMPLANT
NEEDLE HYPO 25GX1X1/2 BEV (NEEDLE) ×3 IMPLANT
NS IRRIG 1000ML POUR BTL (IV SOLUTION) ×3 IMPLANT
PACK GENERAL/GYN (CUSTOM PROCEDURE TRAY) ×3 IMPLANT
PAD ARMBOARD 7.5X6 YLW CONV (MISCELLANEOUS) ×3 IMPLANT
PENCIL SMOKE EVACUATOR (MISCELLANEOUS) ×3 IMPLANT
SPONGE LAP 4X18 RFD (DISPOSABLE) ×6 IMPLANT
SUT MON AB 4-0 PC3 18 (SUTURE) ×3 IMPLANT
SUT SILK 3 0 TIES 10X30 (SUTURE) ×3 IMPLANT
SUT VIC AB 3-0 54X BRD REEL (SUTURE) ×1 IMPLANT
SUT VIC AB 3-0 BRD 54 (SUTURE) ×2
SUT VIC AB 3-0 SH 18 (SUTURE) ×3 IMPLANT
SYR CONTROL 10ML LL (SYRINGE) ×3 IMPLANT
TOWEL GREEN STERILE (TOWEL DISPOSABLE) ×3 IMPLANT
TOWEL GREEN STERILE FF (TOWEL DISPOSABLE) ×3 IMPLANT

## 2018-12-09 NOTE — Anesthesia Procedure Notes (Signed)
Procedure Name: LMA Insertion Date/Time: 12/09/2018 1:05 PM Performed by: Alain Marion, CRNA Pre-anesthesia Checklist: Patient identified, Emergency Drugs available, Suction available and Patient being monitored Patient Re-evaluated:Patient Re-evaluated prior to induction Oxygen Delivery Method: Circle System Utilized Preoxygenation: Pre-oxygenation with 100% oxygen Induction Type: IV induction Ventilation: Mask ventilation without difficulty LMA: LMA inserted LMA Size: 4.0 Number of attempts: 1 Placement Confirmation: positive ETCO2 and breath sounds checked- equal and bilateral Tube secured with: Tape Dental Injury: Teeth and Oropharynx as per pre-operative assessment

## 2018-12-09 NOTE — Interval H&P Note (Signed)
History and Physical Interval Note:  12/09/2018 12:45 PM  Denise Walls  has presented today for surgery, with the diagnosis of LYMPHADENOPATHY.  The various methods of treatment have been discussed with the patient and family. After consideration of risks, benefits and other options for treatment, the patient has consented to  Procedure(s): LEFT DEEP CERVICAL LYMPH NODE BIOPSY (Left) as a surgical intervention.  The patient's history has been reviewed, patient examined, no change in status, stable for surgery.  I have reviewed the patient's chart and labs.  Questions were answered to the patient's satisfaction.     Adin Hector

## 2018-12-09 NOTE — Anesthesia Postprocedure Evaluation (Signed)
Anesthesia Post Note  Patient: Denise Walls  Procedure(s) Performed: LEFT DEEP CERVICAL LYMPH NODE BIOPSY (Left Neck)     Anesthesia Post Evaluation  Last Vitals:  Vitals:   12/09/18 1029 12/09/18 1415  BP: 117/72 136/86  Pulse: 95 94  Resp: 16 13  Temp: 36.7 C   SpO2: 98% 100%    Last Pain:  Vitals:   12/09/18 1415  PainSc: [place]

## 2018-12-09 NOTE — Anesthesia Preprocedure Evaluation (Addendum)
Anesthesia Evaluation  Patient identified by MRN, date of birth, ID band Patient awake    Reviewed: Allergy & Precautions, NPO status , Patient's Chart, lab work & pertinent test results  Airway Mallampati: II  TM Distance: >3 FB Neck ROM: Full    Dental no notable dental hx. (+) Teeth Intact, Dental Advisory Given   Pulmonary neg pulmonary ROS,    Pulmonary exam normal breath sounds clear to auscultation       Cardiovascular hypertension, Pt. on medications negative cardio ROS Normal cardiovascular exam Rhythm:Regular Rate:Normal  HLD  TTE 2020 EF 60-65%, no valvular abnormalities   Neuro/Psych  Headaches, PSYCHIATRIC DISORDERS Anxiety    GI/Hepatic negative GI ROS, Neg liver ROS, Crohn's   Endo/Other  negative endocrine ROS  Renal/GU negative Renal ROS  negative genitourinary   Musculoskeletal  (+) Arthritis ,   Abdominal   Peds  Hematology  (+) Blood dyscrasia (Hgb 11.6), anemia ,   Anesthesia Other Findings   Reproductive/Obstetrics                            Anesthesia Physical Anesthesia Plan  ASA: II  Anesthesia Plan: General   Post-op Pain Management:    Induction: Intravenous  PONV Risk Score and Plan: 3 and Ondansetron, Dexamethasone and Midazolam  Airway Management Planned: LMA  Additional Equipment:   Intra-op Plan:   Post-operative Plan: Extubation in OR  Informed Consent: I have reviewed the patients History and Physical, chart, labs and discussed the procedure including the risks, benefits and alternatives for the proposed anesthesia with the patient or authorized representative who has indicated his/her understanding and acceptance.     Dental advisory given  Plan Discussed with: CRNA  Anesthesia Plan Comments:         Anesthesia Quick Evaluation

## 2018-12-09 NOTE — Transfer of Care (Signed)
Immediate Anesthesia Transfer of Care Note  Patient: Tassie Pollett  Procedure(s) Performed: LEFT DEEP CERVICAL LYMPH NODE BIOPSY (Left Neck)  Patient Location: PACU  Anesthesia Type:General  Level of Consciousness: awake, alert  and oriented  Airway & Oxygen Therapy: Patient Spontanous Breathing and Patient connected to face mask oxygen  Post-op Assessment: Report given to RN and Post -op Vital signs reviewed and stable  Post vital signs: Reviewed and stable  Last Vitals:  Vitals Value Taken Time  BP 136/86 12/09/18 1415  Temp    Pulse 91 12/09/18 1417  Resp 12 12/09/18 1417  SpO2 100 % 12/09/18 1417  Vitals shown include unvalidated device data.  Last Pain:  Vitals:   12/09/18 1049  PainSc: 0-No pain      Patients Stated Pain Goal: 3 (72/90/21 1155)  Complications: No apparent anesthesia complications

## 2018-12-09 NOTE — Discharge Instructions (Signed)
Ice pack to left neck for 10 minutes at a time intermittently Twice an hour While awake For 24 to 36 hours  The clear plastic superglue will wear off in about 3 weeks  You may start taking a shower tomorrow night if you wish No tub baths  You may drive your car in about 1 week  Paquete de hielo en el cuello izquierdo durante 10 minutos a la vez de forma intermitente Dos veces por hora Mientras est despierto Durante 24 a 79 horas  El superpegamento de plstico transparente desaparecer en aproximadamente 3 semanas  Puede empezar a tomar una ducha maana por la noche si lo desea Sin baeras  Usted puede conducir su coche en aproximadamente 1 semana           Managing Your Pain After Surgery Without Opioids    Thank you for participating in our program to help patients manage their pain after surgery without opioids. This is part of our effort to provide you with the best care possible, without exposing you or your family to the risk that opioids pose.  What pain can I expect after surgery? You can expect to have some pain after surgery. This is normal. The pain is typically worse the day after surgery, and quickly begins to get better. Many studies have found that many patients are able to manage their pain after surgery with Over-the-Counter (OTC) medications such as Tylenol and Motrin. If you have a condition that does not allow you to take Tylenol or Motrin, notify your surgical team.  How will I manage my pain? The best strategy for controlling your pain after surgery is around the clock pain control with Tylenol (acetaminophen) and Motrin (ibuprofen or Advil). Alternating these medications with each other allows you to maximize your pain control. In addition to Tylenol and Motrin, you can use heating pads or ice packs on your incisions to help reduce your pain.  How will I alternate your regular strength over-the-counter pain medication? You will take a  dose of pain medication every three hours. ; Start by taking 650 mg of Tylenol (2 pills of 325 mg) ; 3 hours later take 600 mg of Motrin (3 pills of 200 mg) ; 3 hours after taking the Motrin take 650 mg of Tylenol ; 3 hours after that take 600 mg of Motrin.   - 1 -  See example - if your first dose of Tylenol is at 12:00 PM   12:00 PM Tylenol 650 mg (2 pills of 325 mg)  3:00 PM Motrin 600 mg (3 pills of 200 mg)  6:00 PM Tylenol 650 mg (2 pills of 325 mg)  9:00 PM Motrin 600 mg (3 pills of 200 mg)  Continue alternating every 3 hours   We recommend that you follow this schedule around-the-clock for at least 3 days after surgery, or until you feel that it is no longer needed. Use the table on the last page of this handout to keep track of the medications you are taking. Important: Do not take more than 3055m of Tylenol or 32085mof Motrin in a 24-hour period. Do not take ibuprofen/Motrin if you have a history of bleeding stomach ulcers, severe kidney disease, &/or actively taking a blood thinner  What if I still have pain? If you have pain that is not controlled with the over-the-counter pain medications (Tylenol and Motrin or Advil) you might have what we call breakthrough pain. You will receive a prescription for a small amount  of an opioid pain medication such as Oxycodone, Tramadol, or Tylenol with Codeine. Use these opioid pills in the first 24 hours after surgery if you have breakthrough pain. Do not take more than 1 pill every 4-6 hours.  If you still have uncontrolled pain after using all opioid pills, don't hesitate to call our staff using the number provided. We will help make sure you are managing your pain in the best way possible, and if necessary, we can provide a prescription for additional pain medication.   Day 1    Time  Name of Medication Number of pills taken  Amount of Acetaminophen  Pain Level   Comments  AM PM       AM PM       AM PM       AM PM         AM PM       AM PM       AM PM       AM PM       Total Daily amount of Acetaminophen Do not take more than  3,000 mg per day      Day 2    Time  Name of Medication Number of pills taken  Amount of Acetaminophen  Pain Level   Comments  AM PM       AM PM       AM PM       AM PM       AM PM       AM PM       AM PM       AM PM       Total Daily amount of Acetaminophen Do not take more than  3,000 mg per day      Day 3    Time  Name of Medication Number of pills taken  Amount of Acetaminophen  Pain Level   Comments  AM PM       AM PM       AM PM       AM PM          AM PM       AM PM       AM PM       AM PM       Total Daily amount of Acetaminophen Do not take more than  3,000 mg per day      Day 4    Time  Name of Medication Number of pills taken  Amount of Acetaminophen  Pain Level   Comments  AM PM       AM PM       AM PM       AM PM       AM PM       AM PM       AM PM       AM PM       Total Daily amount of Acetaminophen Do not take more than  3,000 mg per day      Day 5    Time  Name of Medication Number of pills taken  Amount of Acetaminophen  Pain Level   Comments  AM PM       AM PM       AM PM       AM PM       AM PM       AM PM  AM PM       AM PM       Total Daily amount of Acetaminophen Do not take more than  3,000 mg per day       Day 6    Time  Name of Medication Number of pills taken  Amount of Acetaminophen  Pain Level  Comments  AM PM       AM PM       AM PM       AM PM       AM PM       AM PM       AM PM       AM PM       Total Daily amount of Acetaminophen Do not take more than  3,000 mg per day      Day 7    Time  Name of Medication Number of pills taken  Amount of Acetaminophen  Pain Level   Comments  AM PM       AM PM       AM PM       AM PM       AM PM       AM PM       AM PM       AM PM       Total Daily amount of Acetaminophen Do not take more than  3,000 mg per  day        For additional information about how and where to safely dispose of unused opioid medications - [employer]  Disclaimer: This document contains information and/or instructional materials adapted from [facility] for the typical patient with your condition. It does not replace medical advice from your health care provider because your experience may differ from that of the typical patient. Talk to your health care provider if you have any questions about this document, your condition or your treatment plan. Adapted from [facility]

## 2018-12-09 NOTE — Op Note (Addendum)
Patient Name:           Denise Walls   Date of Surgery:        12/09/2018  Pre op Diagnosis:      Lymphoma  Post op Diagnosis:    Lymphoma  Procedure:                 Excision deep left cervical lymph node, posterior triangle                                      Repair and reconstruction left sternocleidomastoid muscle  Surgeon:                     Denise Walls. Denise Walls, M.D., FACS  Assistant:                      Or staff  Operative Indications:   The patient is a 58 year old female who presents with a complaint of lymphadenopathy. she is referred by [name] for lymph node biopsy to clarify what is felt to be a lymphoma. Denise Walls is her PCP. Dr. Lyndel Walls is her gastroenterologist. She has chronic Crohn's disease. She developed some left lower quadrant pain, anorexia and weight loss but no fever chills or night sweats. Last colonoscopy 2019 was unremarkable. CT scan showed left pleural effusion and diffuse abdominal lymphadenopathy and subcarinal lymphadenopathy. The largest abdominal lymph node was 3.20 cm involving the mesenteric nodal mass encasing the SMA. Subsequent PET/CT showed widespread avid lymphadenopathy. Most of this was in the chest and abdomen. Nothing in the inguinal areas. Nothing in the axilla. There is a suggestion of some small avid nodes in the left supraclavicular area and behind the sternocleidomastoid muscle. Nothing bulky. Interventional radiology biopsied a lymph node in the left supraclavicular area percutaneously and that showed atypical lymphoid proliferation but not diagnostic. She's had a bone marrow biopsy which is negative. She's had a port placed by interventional radiology.  On exam there is a tiny lymph node in the supraclavicular area on the left. There may be a lymph node higher in the left neck. There may be a small lymph node lateral to the right sternocleidomastoid muscle. The port is in the right internal jugular vein with the port in  the right side infraclavicular area. We are going to go ahead with left neck exploration  I discussed the indications, techniques, and risks with the patient and her daughter. They're aware the risks of bleeding, infection, rarely injury to the spinal accessory nerve with chronic shoulder disability and pain. Possibility of not being able to get a diagnosis. If we cannot get a diagnosis in the neck then she will need to have an abdominal operation in the future to get more tissue. She understands all of these issues. All questions are answered. She agrees with this plan.  Operative Findings:       Deep to the platysma muscle there were no obvious abnormal lymph nodes.  I mobilized the sternocleidomastoid muscle from lateral to medial and found a 1 cm lymph node that was quite firm and probably pathologic.  This was posterior to the sternocleidomastoid muscle and posterior to the omohyoid muscle.  I had to partially divide the sternocleidomastoid muscle for exposure.  I identified the omohyoid muscle and encircled it with a vessel loop but it was not sacrificed.  Procedure in Detail:  Following the induction of general LMA anesthesia the patient was positioned with a roll behind her shoulders, arms tucked at her sides and the neck extended to the right.  This gave [place] reasonably good extension.  Surgical timeout was performed.  The entire neck was prepped and draped in a sterile fashion.  0.5% Marcaine with epinephrine was used as a local infiltration anesthetic.     I made a transverse skin crease incision about 2 cm above the clavicle.  Dissection was carried down through the platysma muscle.  I divided the transverse cervical vein, clamped it and ligated it with 3-0 silk ties.  I carried the dissection down and look superiorly and inferiorly and there were no obvious lymph nodes.  I mobilized the sternocleidomastoid muscle from lateral to medial and could palpate a lymph node behind the  omohyoid muscle.  The omohyoid muscle was isolated, encircled with a vessel loop for retraction.  I slowly dissected this lymph node out.  It was quite posterior and medial.  I ultimately divided the lateral aspect of the sternocleidomastoid muscle and then slowly dissected the lymph node out.  Its attachments were controlled with 3-0 silk ties.  Once I had the lymph node out I could see that it was much more firm than normal and about 1 cm in diameter.  It was sent fresh to the lab in saline for lymphoma work-up.  The lab was called.      The wound was irrigated.  Hemostasis was excellent.  There was no fluid in the wound.  The sternocleidomastoid muscle was reapproximated with interrupted figure-of-eight sutures of 3-0 Vicryl.  The platysma muscle was reapproximated with 3-0 Vicryl sutures.  The skin was closed with a running subcuticular 4-0 Monocryl and Dermabond.  The patient tolerated the procedure well and was taken to PACU in stable condition.  EBL 20 cc.  Counts correct.  Complications none.    Addendum: I logged onto the PMP aware website and reviewed her prescription medication history     Denise Walls M. Denise Walls, M.D., FACS General and Minimally Invasive Surgery Breast and Colorectal Surgery  12/09/2018 2:06 PM

## 2018-12-10 ENCOUNTER — Encounter (HOSPITAL_COMMUNITY): Payer: Self-pay | Admitting: Hematology

## 2018-12-10 ENCOUNTER — Encounter (HOSPITAL_COMMUNITY): Payer: Self-pay | Admitting: General Surgery

## 2018-12-10 LAB — CD19 AND CD20, FLOW CYTOMETRY

## 2018-12-12 LAB — BODY FLUID CULTURE
Culture: NO GROWTH
Special Requests: NORMAL

## 2018-12-13 ENCOUNTER — Other Ambulatory Visit: Payer: Self-pay | Admitting: Hematology

## 2018-12-13 DIAGNOSIS — C8339 Diffuse large B-cell lymphoma, extranodal and solid organ sites: Secondary | ICD-10-CM

## 2018-12-13 NOTE — Progress Notes (Signed)
START ON PATHWAY REGIMEN - Lymphoma and CLL     A cycle is every 21 days:     Prednisone      Rituximab-xxxx      Cyclophosphamide      Doxorubicin      Vincristine   **Always confirm dose/schedule in your pharmacy ordering system**  Patient Characteristics: Diffuse Large B-Cell Lymphoma or Follicular Lymphoma, Grade 3B, First Line, Stage III and IV Disease Type: Not Applicable Disease Type: Diffuse Large B-Cell Lymphoma Disease Type: Not Applicable Line of therapy: First Line [place] Stage: IV Intent of Therapy: Curative Intent, Discussed with Patient

## 2018-12-13 NOTE — Progress Notes (Signed)
[place] OFFICE PROGRESS NOTE  Patient Care Team: [name], MD as PCP - General (Family Medicine)  HEME/ONC OVERVIEW: 1. Stage IV DBLCL, GCB subtype; R-IPI 2, good risk -10/2018: CT AP (for pain) showed abdominal lymphadenopathy, largest 3.2cm mesentery nodal mass encasing the SMA; PET showed widespread FDG-avid lymphadenopathy involving neck, chest and abdomen/pelvis  -11/2018:   Normal LVEF on TTE   Thoracentesis for large left pleural effusion. Cytology positive for B-cell lymphoma.  Bone marrow aspiration and bx: no lymphoma involvement  Cervical LN core bx non-diagnostic   Excisional left cervical LN bx: DLBCL, GCB subtype. Ki-67 30%. FISH pending. -12/2018 - present: R-CHOP (tentatively)  2. Port placement in 11/2018  TREATMENT REGIMEN:  12/21/2018 - present: R-CHOP, tentatively pending FISH panel; plan for 6 cycles   PERTINENT NON-HEM/ONC PROBLEMS: 1. Hx of Crohn's disease, complicated by partial SBO involving TI and right colon; on Lialda -Colonoscopy unremarkable in 2019   ASSESSMENT & PLAN:   Stage IV DLBCL, GCB subtype; good risk by R-IPI  -I reviewed the pathology and imaging results in detail with the patient -We also discussed the NCCN guideline in detail, including treatment options and prognosis -FISH study is currently pending, which would determine the treatment regimen, including R-CHOP vs. R-EPOCH -However, in light of her large left pleural effusion, elevated LDH, and delay in scheduling between non-diagnostic LN bx and excisional LN bx, I would favor initiating treatment with R-CHOP for the 1st cycle, and if FISH panel shows double hit lymphoma, then we can change treatment to R-EPOCH  -We discussed the role of chemotherapy. The intent is for cure. -The decision was made based on publication at the [facility]. It is a category 1 recommendation from NCCN. -We discussed some of the risks, benefits and side-effects of Rituximab,Cytoxan,  Adriamycin,Vincristine and Solumedrol/Prednisone.  -Some of the short term side-effects included, though not limited to, risk of fatigue, weight loss, tumor lysis syndrome, risk of allergic reactions, pancytopenia, life-threatening infections, need for transfusions of blood products, nausea, vomiting, change in bowel habits, hair loss, risk of congestive heart failure, admission to hospital for various reasons, and risks of death.  -Long term side-effects are also discussed including permanent damage to nerve function, chronic fatigue, and rare secondary malignancy including bone marrow disorders.  -The patient is aware that the response rates discussed earlier is not guaranteed.   -After a long discussion, patient made an informed decision to proceed with the prescribed plan of care.  -Patient education material was dispensed -We will tentatively start the 1st cycle of R-CHOP on 12/21/2018; plan for 6 cycles -I have sent PRN anti-emetics to her pharmacy, including Zofran and Compazine -Ppx: allopurinol   Malignant pleural effusion -S/p diagnostic and therapeutic left thoracentesis; fluid positive for B-cell lymphoma -From a symptom standpoint, her dyspnea is improving -I discussed with the patient that with initiation of chemotherapy, the pleural effusion should improve  -If she develops any recurrent progressive dyspnea, we can consider repeating thoracentesis until the chemotherapy has time to take effect   Normocytic anemia -Secondary to anemia of chronic disease  -Hgb 11.3 today, stable -Patient denies any symptoms of bleeding -We will monitor it for now   Intermittent nausea -Possibly due to underlying lymphoma -Patient denies any symptoms of bowel obstruction -I have prescribed PRN anti-emetics today, including Zofran and Compazine  Orders Placed This Encounter  Procedures  . PHYSICIAN COMMUNICATION ORDER    A baseline Echo/ [place] should be obtained prior to initiation of  Anthracycline Chemotherapy  . PHYSICIAN COMMUNICATION ORDER    Hepatitis B Virus screening with HBsAg and anti-HBc recommended prior to treatment with rituximab (Rituxan), ofatumumab (Arzerra) or obinutuzumab Dyann Kief).   All questions were answered. The patient knows to call the clinic with any problems, questions or concerns. No barriers to learning was detected.  A total of more than 40 minutes were spent face-to-face with the patient during this encounter and over half of that time was spent on counseling and coordination of care as outlined above.   First cycle of chemotherapy tentatively scheduled on 12/21/2018. Return on 12/31/2018 for toxicity checks.   Tish Men, MD 12/14/2018 12:38 PM  CHIEF COMPLAINT: "I am very tired"  INTERVAL HISTORY: Ms. Kawahara returns to clinic for follow-up of recently diagnosed DLBCL.  Patient reports that she had significant shortness of breath with exertion, which improved after thoracentesis.  She still has mild shortness of breath with exertion, but it is overall stable.  She also reports progressive fatigue since the last visit, which limits her ability to do chores around the house.  Her appetite has also decreased since last visit.  She reports some intermittent nausea, but denies any vomiting.  She denies any constitutional symptoms.  SUMMARY OF ONCOLOGIC HISTORY: Oncology History  DLBCL (diffuse large B cell lymphoma) ([place])  10/29/2018 Imaging   CT abdomen/pelvis: IMPRESSION: Abdominopelvic lymphadenopathy, including a dominant 3.2 cm short axis jejunal mesentery nodal mass encasing the SMA, partially necrotic. Overall appearance favors lymphoma, although nodal metastases are also possible.   Spleen is normal in size. However, hypoenhancing lesions are suspected on the portal venous phase, raising the possibility of lymphomatous involvement.   Suspected subcarinal nodal mass, incompletely evaluated. Consider CT chest or PET-CT for further  evaluation.   Moderate left pleural effusion. Associated left lower lobe opacity, likely atelectasis.   11/10/2018 Imaging   PET: IMPRESSION: 1. 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 2. Nodal metastasis include the lower neck, mediastinum, mesentery, peritoneum and retroperitoneum, and iliac lymph nodes. 3. Spleen and bone marrow normal. 4. Moderate LEFT pleural effusion. 5. Target lymph nodes for sampling could include the LEFT external iliac lymph node, precordial lymph node in the LEFT upper quadrant, or super clavicular/LEFT sub pectoralis nodes.   12/03/2018 Bone Marrow Biopsy   Bone Marrow, Aspirate,Biopsy, and Clot, left posterior iliac crest - NORMOCELLULAR MARROW WITH FOCAL SMALL LYMPHOID AGGREGATES. - SEE COMMENT. PERIPHERAL BLOOD: - BORDERLINE NORMOCYTIC ANEMIA. Diagnosis Note The marrow has only a few small lymphoid aggregates which are not overtly atypical and contain a mixture of B-cells and T-cells. Flow cytometry is negative for a monoclonal B-cell population. These findings are not diagnostic of a lymphoproliferative process.  Accession: [id number] Bone Marrow Flow Cytometry - NO MONOCLONAL B-CELL OR PHENOTYPICALLY ABERRANT T-CELL POPULATION IDENTIFIED.   12/03/2018 Imaging   TTE:  1. The left ventricle has normal systolic function with an ejection fraction of 60-65%. The cavity size was normal. Left ventricular diastolic Doppler parameters are consistent with impaired relaxation.  2. The right ventricle has normal systolic function. The cavity was normal. There is no increase in right ventricular wall thickness.  3. Large pleural effusion.  4. Clinical correlation suggested.  5. The mitral valve is grossly normal.  6. The tricuspid valve is grossly normal.  7. The aortic valve is grossly normal. Aortic valve regurgitation was not assessed by color flow Doppler.  8. The aortic root is normal in size and structure.    12/08/2018 Procedure   Left  thoracentesis   12/08/2018 Pathology Results   Diagnosis PLEURAL FLUID, LEFT (SPECIMEN 1 OF 1 COLLECTED 12/08/18): - B-CELL LYMPHOMA - SEE COMMENT   12/09/2018 Procedure   Left supraclavicular LN bx    12/09/2018 Pathology Results   Accession: [id number] Lymph node for lymphoma, left deep cervical - DIFFUSE LARGE B-CELL LYMPHOMA - SEE COMMENT   12/29/2018 -  Chemotherapy   The patient had DOXOrubicin (ADRIAMYCIN) chemo injection 80 mg, 50 mg/m2 = 80 mg, Intravenous,  Once, 0 of 6 cycles palonosetron (ALOXI) injection 0.25 mg, 0.25 mg, Intravenous,  Once, 0 of 6 cycles pegfilgrastim-jmdb (FULPHILA) injection 6 mg, 6 mg, Subcutaneous,  Once, 0 of 6 cycles vinCRIStine (ONCOVIN) 2 mg in sodium chloride 0.9 % 50 mL chemo infusion, 2 mg, Intravenous,  Once, 0 of 6 cycles riTUXimab (RITUXAN) 600 mg in sodium chloride 0.9 % 250 mL (1.9355 mg/mL) infusion, 375 mg/m2 = 600 mg, Intravenous,  Once, 0 of 6 cycles cyclophosphamide (CYTOXAN) 1,200 mg in sodium chloride 0.9 % 250 mL chemo infusion, 750 mg/m2 = 1,200 mg, Intravenous,  Once, 0 of 6 cycles fosaprepitant (EMEND) 150 mg, dexamethasone (DECADRON) 12 mg in sodium chloride 0.9 % 145 mL IVPB, , Intravenous,  Once, 0 of 6 cycles  for chemotherapy treatment.      REVIEW OF SYSTEMS:   Constitutional: ( - ) fevers, ( - )  chills , ( - ) night sweats Eyes: ( - ) blurriness of vision, ( - ) double vision, ( - ) watery eyes Ears, nose, mouth, throat, and face: ( - ) mucositis, ( - ) sore throat Respiratory: ( - ) cough, ( + ) dyspnea, ( - ) wheezes Cardiovascular: ( - ) palpitation, ( - ) chest discomfort, ( - ) lower extremity swelling Gastrointestinal:  ( + ) nausea, ( - ) heartburn, ( - ) change in bowel habits Skin: ( - ) abnormal skin rashes Lymphatics: ( - ) new lymphadenopathy, ( - ) easy bruising Neurological: ( - ) numbness, ( - ) tingling, ( - ) new weaknesses Behavioral/Psych: ( - ) mood change, ( - ) new  changes  All other systems were reviewed with the patient and are negative.  I have reviewed the past medical history, past surgical history, social history and family history with the patient and they are unchanged from previous note.  ALLERGIES:  is allergic to nsaids.  MEDICATIONS:  Current Outpatient Medications  Medication Sig Dispense Refill  . amLODipine (NORVASC) 5 MG tablet Take 5 mg by mouth daily.     . calcium carbonate (TUMS - DOSED IN MG ELEMENTAL CALCIUM) 500 MG chewable tablet Chew 2 tablets by mouth daily as needed for indigestion or heartburn.    . cholecalciferol (VITAMIN D3) 25 MCG (1000 UT) tablet Take 1,000 Units by mouth daily.    . hydrochlorothiazide (HYDRODIURIL) 25 MG tablet Take 25 mg by mouth daily.    Marland Kitchen HYDROcodone-acetaminophen (NORCO) 5-325 MG tablet Take 1-2 tablets by mouth every 6 (six) hours as needed for moderate pain or severe pain. 20 tablet 0  . lovastatin (MEVACOR) 40 MG tablet Take 40 mg by mouth daily.     . Melatonin 3 MG TABS Take 3 mg by mouth at bedtime as needed (sleep).    . mesalamine (LIALDA) 1.2 g EC tablet Take 2.4 g by mouth daily.     . Nutritional Supplements (IMMUNE ENHANCE) TABS Take 1 tablet by mouth daily.    Marland Kitchen OVER THE COUNTER MEDICATION Take 2  tablets by mouth daily as needed (acid reflux). Soothing digestive relief otc    . polyethylene glycol (MIRALAX / GLYCOLAX) 17 g packet Take 17 g by mouth daily as needed for mild constipation.    . potassium chloride (KLOR-CON) 8 MEQ tablet Take 8 mEq by mouth daily.     . traMADol (ULTRAM) 50 MG tablet Take 1 tablet (50 mg total) by mouth every 8 (eight) hours as needed. (Patient taking differently: Take 50 mg by mouth every 8 (eight) hours as needed for moderate pain. ) 45 tablet 1  . allopurinol (ZYLOPRIM) 300 MG tablet Take 1 tablet (300 mg total) by mouth daily. 30 tablet 3  . lidocaine-prilocaine (EMLA) cream Apply to affected area once 30 g 3  . ondansetron (ZOFRAN) 8 MG tablet Take  1 tablet (8 mg total) by mouth 2 (two) times daily as needed for refractory nausea / vomiting. Start on day 3 after cyclophosphamide chemotherapy. 30 tablet 1  . predniSONE (DELTASONE) 20 MG tablet Take 5 tablets (100 mg total) by mouth daily for 5 days. Take on days 1-5 of chemotherapy. 25 tablet 5  . prochlorperazine (COMPAZINE) 10 MG tablet Take 1 tablet (10 mg total) by mouth every 6 (six) hours as needed (Nausea or vomiting). 30 tablet 6   No current facility-administered medications for this visit.    Facility-Administered Medications Ordered in Other Visits  Medication Dose Route Frequency Provider Last Rate Last Dose  . sodium chloride flush (NS) 0.9 % injection 10 mL  10 mL Intravenous PRN Tish Men, MD   10 mL at 12/14/18 1147    PHYSICAL EXAMINATION: ECOG PERFORMANCE STATUS: 1 - Symptomatic but completely ambulatory  Today's Vitals   12/14/18 1131  BP: 107/72  Pulse: 82  Resp: 18  Temp: 98.7 F (37.1 C)  TempSrc: Temporal  SpO2: 100%  Weight: 130 lb 1.3 oz (59 kg)  Height: 5' (1.524 m)  PainSc: 0-No pain   Body mass index is 25.4 kg/m.  Filed Weights   12/14/18 1131  Weight: 130 lb 1.3 oz (59 kg)    GENERAL: alert, no distress and comfortable SKIN: skin color, texture, turgor are normal, no rashes or significant lesions EYES: conjunctiva are pink and non-injected, sclera clear OROPHARYNX: no exudate, no erythema; lips, buccal mucosa, and tongue normal  NECK: supple, non-tender, left neck incisional wound healing without any signs of infection  LUNGS: clear to auscultation with normal breathing effort HEART: regular rate & rhythm and no murmurs and no lower extremity edema ABDOMEN: soft, non-tender, non-distended, normal bowel sounds Musculoskeletal: no cyanosis of digits and no clubbing  PSYCH: alert & oriented x 3, fluent speech NEURO: no focal motor/sensory deficits  LABORATORY DATA:  I have reviewed the data as listed    Component Value Date/Time   NA  138 12/14/2018 1141   K 3.8 12/14/2018 1141   CL 99 12/14/2018 1141   CO2 29 12/14/2018 1141   GLUCOSE 114 (H) 12/14/2018 1141   BUN 9 12/14/2018 1141   CREATININE 0.65 12/14/2018 1141   CALCIUM 8.1 (L) 12/14/2018 1141   PROT 6.2 (L) 12/14/2018 1141   ALBUMIN 3.4 (L) 12/14/2018 1141   AST 22 12/14/2018 1141   ALT 18 12/14/2018 1141   ALKPHOS 57 12/14/2018 1141   BILITOT 0.4 12/14/2018 1141   GFRNONAA >60 12/14/2018 1141   GFRAA >60 12/14/2018 1141    No results found for: SPEP, UPEP  Lab Results  Component Value Date   WBC 7.5 12/14/2018  NEUTROABS 6.2 12/14/2018   HGB 11.3 (L) 12/14/2018   HCT 35.8 (L) 12/14/2018   MCV 82.7 12/14/2018   PLT 388 12/14/2018      Chemistry      Component Value Date/Time   NA 138 12/14/2018 1141   K 3.8 12/14/2018 1141   CL 99 12/14/2018 1141   CO2 29 12/14/2018 1141   BUN 9 12/14/2018 1141   CREATININE 0.65 12/14/2018 1141      Component Value Date/Time   CALCIUM 8.1 (L) 12/14/2018 1141   ALKPHOS 57 12/14/2018 1141   AST 22 12/14/2018 1141   ALT 18 12/14/2018 1141   BILITOT 0.4 12/14/2018 1141       RADIOGRAPHIC STUDIES: I have personally reviewed the radiological images as listed below and agreed with the findings in the report. Dg Chest 1 View  Result Date: 12/08/2018 CLINICAL DATA:  Status post left thoracentesis EXAM: CHEST  1 VIEW COMPARISON:  PET-CT from 11/10/2018 FINDINGS: Cardiac shadow is stable. Right chest wall port is noted. Left basilar atelectasis is noted with tiny residual effusion following thoracentesis. No pneumothorax is noted. No acute bony abnormality is seen. IMPRESSION: Minimal residual left pleural effusion with mild atelectasis. No pneumothorax is noted. Electronically Signed   By: Inez Catalina M.D.   On: 12/08/2018 15:59   [place] Core Biopsy (lymph Nodes)  Result Date: 11/22/2018 INDICATION: 58 year old with diffuse lymphadenopathy. Concern for metastasis or lymphoma. Tissue diagnosis is needed. EXAM:  ULTRASOUND-GUIDED LEFT SUPRACLAVICULAR LYMPH NODE BIOPSY MEDICATIONS: None. ANESTHESIA/SEDATION: Moderate (conscious) sedation was employed during this procedure. A total of Versed 2.0 mg and Fentanyl 50 mcg was administered intravenously. Moderate Sedation Time: 22 minutes. The patient's level of consciousness and vital signs were monitored continuously by radiology nursing throughout the procedure under my direct supervision. FLUOROSCOPY TIME:  None COMPLICATIONS: None immediate. PROCEDURE: Informed written consent was obtained from the patient after a thorough discussion of the procedural risks, benefits and alternatives. All questions were addressed. A timeout was performed prior to the initiation of the procedure. Left side of the neck was evaluated with ultrasound. Multiple hypoechoic lymph nodes were identified in the left supraclavicular region. Left side of the neck was prepped with chlorhexidine and sterile field was created. Skin and soft tissues were anesthetized with 1% lidocaine. Using ultrasound guidance, 18 gauge core needle was directed into a dominant lymph node just lateral to the left internal jugular vein. Core biopsies obtained and placed in saline. Total of 6 ultrasound-guided core biopsies were obtained from this lymph node. Specimens placed in saline. Bandage placed over the puncture site. FINDINGS: Several hypoechoic lymph nodes throughout the left supraclavicular area. Dominant lymph node lateral to the left internal jugular vein was sampled. Needle position was confirmed within the lymph node on all core biopsies. No significant bleeding or hematoma formation at the end of the procedure. IMPRESSION: Ultrasound-guided core biopsy of a left supraclavicular lymph node. Electronically Signed   By: Markus Daft M.D.   On: 11/22/2018 15:29   Ir Imaging Guided Port Insertion  Result Date: 11/29/2018 INDICATION: 58 year old with lymphadenopathy and concern for a lymphoproliferative process or  lymphoma. Request for Port-A-Cath placement. EXAM: FLUOROSCOPIC AND ULTRASOUND GUIDED PLACEMENT OF A SUBCUTANEOUS PORT COMPARISON:  None. MEDICATIONS: Ancef 2 g; The antibiotic was administered within an appropriate time interval prior to skin puncture. ANESTHESIA/SEDATION: Versed 2.0 mg IV; Fentanyl 100 mcg IV; Moderate Sedation Time:  40 minutes The patient was continuously monitored during the procedure by the interventional radiology nurse under  my direct supervision. FLUOROSCOPY TIME:  54 seconds, 7 mGy COMPLICATIONS: None immediate. PROCEDURE: The procedure, risks, benefits, and alternatives were explained to the patient. Questions regarding the procedure were encouraged and answered. The patient understands and consents to the procedure. Patient was placed supine on the interventional table. Ultrasound confirmed a patent right internal jugular vein. Ultrasound image was saved for documentation. The right chest and neck were cleaned with a skin antiseptic and a sterile drape was placed. Maximal barrier sterile technique was utilized including caps, mask, sterile gowns, sterile gloves, sterile drape, hand hygiene and skin antiseptic. The right neck was anesthetized with 1% lidocaine. Small incision was made in the right neck with a blade. Micropuncture set was placed in the right internal jugular vein with ultrasound guidance. The micropuncture wire was used for measurement purposes. The right chest was anesthetized with 1% lidocaine. #15 blade was used to make an incision and a subcutaneous port pocket was formed. [place] was assembled. Subcutaneous tunnel was formed with a stiff tunneling device. The port catheter was brought through the subcutaneous tunnel. The port was placed in the subcutaneous pocket and sutured in place. The micropuncture set was exchanged for a peel-away sheath. The catheter was placed through the peel-away sheath and the tip was positioned in the lower SVC. Catheter  placement was confirmed with fluoroscopy. The port was accessed and flushed with heparinized saline. The port pocket was closed using two layers of absorbable sutures and Dermabond. The vein skin site was closed using a single layer of absorbable suture and Dermabond. Sterile dressings were applied. Patient tolerated the procedure well without an immediate complication. Ultrasound and fluoroscopic images were taken and saved for this procedure. IMPRESSION: Placement of a subcutaneous port device. Catheter tip in the lower SVC. Persistent moderate sized left pleural effusion. Electronically Signed   By: Markus Daft M.D.   On: 11/29/2018 18:14   US Thoracentesis Asp Pleural Space W/img Guide  Result Date: 12/08/2018 INDICATION: Patient with history of lymphadenopathy concerning for lymphoma, dyspnea, left pleural effusion. Request made for diagnostic and therapeutic left thoracentesis. EXAM: ULTRASOUND GUIDED DIAGNOSTIC AND THERAPEUTIC LEFT THORACENTESIS MEDICATIONS: None COMPLICATIONS: None immediate. PROCEDURE: An ultrasound guided thoracentesis was thoroughly discussed with the patient and questions answered. The benefits, risks, alternatives and complications were also discussed. The patient understands and wishes to proceed with the procedure. Written consent was obtained. Ultrasound was performed to localize and mark an adequate pocket of fluid in the left chest. The area was then prepped and draped in the normal sterile fashion. 1% Lidocaine was used for local anesthesia. Under ultrasound guidance a 6 Fr Safe-T-Centesis catheter was introduced. Thoracentesis was performed. The catheter was removed and a dressing applied. FINDINGS: A total of approximately 1.2 liters of turbid, yellow fluid was removed. Samples were sent to the laboratory as requested by the clinical team. Ordering MD was notified of above findings. IMPRESSION: Successful ultrasound guided diagnostic and therapeutic left thoracentesis  yielding 1.2 liters of pleural fluid. Read by: Rowe Robert, PA-C Electronically Signed   By: Jacqulynn Cadet M.D.   On: 12/08/2018 16:28

## 2018-12-14 ENCOUNTER — Encounter: Payer: Self-pay | Admitting: Hematology

## 2018-12-14 ENCOUNTER — Inpatient Hospital Stay: Payer: Commercial Managed Care - PPO

## 2018-12-14 ENCOUNTER — Other Ambulatory Visit: Payer: Self-pay

## 2018-12-14 ENCOUNTER — Inpatient Hospital Stay (HOSPITAL_BASED_OUTPATIENT_CLINIC_OR_DEPARTMENT_OTHER): Payer: Commercial Managed Care - PPO | Admitting: Hematology

## 2018-12-14 ENCOUNTER — Telehealth: Payer: Self-pay | Admitting: Hematology

## 2018-12-14 VITALS — BP 107/72 | HR 82 | Temp 98.7°F | Resp 18 | Ht 60.0 in | Wt 130.1 lb

## 2018-12-14 DIAGNOSIS — D649 Anemia, unspecified: Secondary | ICD-10-CM

## 2018-12-14 DIAGNOSIS — C8339 Diffuse large B-cell lymphoma, extranodal and solid organ sites: Secondary | ICD-10-CM

## 2018-12-14 DIAGNOSIS — R110 Nausea: Secondary | ICD-10-CM

## 2018-12-14 DIAGNOSIS — Z95828 Presence of other vascular implants and grafts: Secondary | ICD-10-CM

## 2018-12-14 DIAGNOSIS — R0609 Other forms of dyspnea: Secondary | ICD-10-CM

## 2018-12-14 DIAGNOSIS — J910 Malignant pleural effusion: Secondary | ICD-10-CM

## 2018-12-14 LAB — CMP (CANCER CENTER ONLY)
ALT: 18 U/L (ref 0–44)
AST: 22 U/L (ref 15–41)
Albumin: 3.4 g/dL — ABNORMAL LOW (ref 3.5–5.0)
Alkaline Phosphatase: 57 U/L (ref 38–126)
Anion gap: 10 (ref 5–15)
BUN: 9 mg/dL (ref 6–20)
CO2: 29 mmol/L (ref 22–32)
Calcium: 8.1 mg/dL — ABNORMAL LOW (ref 8.9–10.3)
Chloride: 99 mmol/L (ref 98–111)
Creatinine: 0.65 mg/dL (ref 0.44–1.00)
GFR, Est AFR Am: >60 mL/min (ref >60)
GFR, Estimated: >60 mL/min (ref >60)
Glucose, Bld: 114 mg/dL — ABNORMAL HIGH (ref 70–99)
Potassium: 3.8 mmol/L (ref 3.5–5.1)
Sodium: 138 mmol/L (ref 135–145)
Total Bilirubin: 0.4 mg/dL (ref 0.3–1.2)
Total Protein: 6.2 g/dL — ABNORMAL LOW (ref 6.5–8.1)

## 2018-12-14 LAB — CBC WITH DIFFERENTIAL (CANCER CENTER ONLY)
Abs Immature Granulocytes: 0.03 10*3/uL (ref 0.00–0.07)
Basophils Absolute: 0 10*3/uL (ref 0.0–0.1)
Basophils Relative: 0 %
Eosinophils Absolute: 0 10*3/uL (ref 0.0–0.5)
Eosinophils Relative: 0 %
HCT: 35.8 % — ABNORMAL LOW (ref 36.0–46.0)
Hemoglobin: 11.3 g/dL — ABNORMAL LOW (ref 12.0–15.0)
Immature Granulocytes: 0 %
Lymphocytes Relative: 9 %
Lymphs Abs: 0.7 10*3/uL (ref 0.7–4.0)
MCH: 26.1 pg (ref 26.0–34.0)
MCHC: 31.6 g/dL (ref 30.0–36.0)
MCV: 82.7 fL (ref 80.0–100.0)
Monocytes Absolute: 0.6 10*3/uL (ref 0.1–1.0)
Monocytes Relative: 8 %
Neutro Abs: 6.2 10*3/uL (ref 1.7–7.7)
Neutrophils Relative %: 83 %
Platelet Count: 388 10*3/uL (ref 150–400)
RBC: 4.33 MIL/uL (ref 3.87–5.11)
RDW: 17.3 % — ABNORMAL HIGH (ref 11.5–15.5)
WBC Count: 7.5 10*3/uL (ref 4.0–10.5)
nRBC: 0 % (ref 0.0–0.2)

## 2018-12-14 MED ORDER — ONDANSETRON HCL 8 MG PO TABS: 8.0000 mg | ORAL_TABLET | Freq: Two times a day (BID) | ORAL | 1 refills | Status: DC | PRN

## 2018-12-14 MED ORDER — LIDOCAINE-PRILOCAINE 2.5-2.5 % EX CREA: TOPICAL_CREAM | CUTANEOUS | 3 refills | Status: DC

## 2018-12-14 MED ORDER — HEPARIN SOD (PORK) LOCK FLUSH 100 UNIT/ML IV SOLN
500.0000 [IU] | Freq: Once | INTRAVENOUS | Status: AC
  Administered 2018-12-14: 500 [IU] via INTRAVENOUS
  Filled 2018-12-14: qty 5

## 2018-12-14 MED ORDER — PREDNISONE 20 MG PO TABS: 100.0000 mg | ORAL_TABLET | Freq: Every day | ORAL | 5 refills | Status: AC

## 2018-12-14 MED ORDER — PROCHLORPERAZINE MALEATE 10 MG PO TABS: 10.0000 mg | ORAL_TABLET | Freq: Four times a day (QID) | ORAL | 6 refills | Status: DC | PRN

## 2018-12-14 MED ORDER — SODIUM CHLORIDE 0.9% FLUSH
10.0000 mL | INTRAVENOUS | Status: DC | PRN
  Administered 2018-12-14: 10 mL via INTRAVENOUS
  Filled 2018-12-14: qty 10

## 2018-12-14 MED ORDER — ALLOPURINOL 300 MG PO TABS: 300.0000 mg | ORAL_TABLET | Freq: Every day | ORAL | 3 refills | Status: DC

## 2018-12-14 NOTE — Patient Instructions (Signed)
Implanted Port Insertion, Care After This sheet gives you information about how to care for yourself after your procedure. Your health care provider may also give you more specific instructions. If you have problems or questions, contact your health care provider. What can I expect after the procedure? After the procedure, it is common to have:  Discomfort at the port insertion site.  Bruising on the skin over the port. This should improve over 3-4 days. Follow these instructions at home: [facility] care  After your port is placed, you will get a manufacturer's information card. The card has information about your port. Keep this card with you at all times.  Take care of the port as told by your health care provider. Ask your health care provider if you or a family member can get training for taking care of the port at home. A home health care nurse may also take care of the port.  Make sure to remember what type of port you have. Incision care      Follow instructions from your health care provider about how to take care of your port insertion site. Make sure you: ? Wash your hands with soap and water before and after you change your bandage (dressing). If soap and water are not available, use hand sanitizer. ? Change your dressing as told by your health care provider. ? Leave stitches (sutures), skin glue, or adhesive strips in place. These skin closures may need to stay in place for 2 weeks or longer. If adhesive strip edges start to loosen and curl up, you may trim the loose edges. Do not remove adhesive strips completely unless your health care provider tells you to do that.  Check your port insertion site every day for signs of infection. Check for: ? Redness, swelling, or pain. ? Fluid or blood. ? Warmth. ? Pus or a bad smell. Activity  Return to your normal activities as told by your health care provider. Ask your health care provider what activities are safe for you.  Do not  lift anything that is heavier than 10 lb (4.5 kg), or the limit that you are told, until your health care provider says that it is safe. General instructions  Take over-the-counter and prescription medicines only as told by your health care provider.  Do not take baths, swim, or use a hot tub until your health care provider approves. Ask your health care provider if you may take showers. You may only be allowed to take sponge baths.  Do not drive for 24 hours if you were given a sedative during your procedure.  Wear a medical alert bracelet in case of an emergency. This will tell any health care providers that you have a port.  Keep all follow-up visits as told by your health care provider. This is important. Contact a health care provider if:  You cannot flush your port with saline as directed, or you cannot draw blood from the port.  You have a fever or chills.  You have redness, swelling, or pain around your port insertion site.  You have fluid or blood coming from your port insertion site.  Your port insertion site feels warm to the touch.  You have pus or a bad smell coming from the port insertion site. Get help right away if:  You have chest pain or shortness of breath.  You have bleeding from your port that you cannot control. Summary  Take care of the port as told by your health  care provider. Keep the manufacturer's information card with you at all times.  Change your dressing as told by your health care provider.  Contact a health care provider if you have a fever or chills or if you have redness, swelling, or pain around your port insertion site.  Keep all follow-up visits as told by your health care provider. This information is not intended to replace advice given to you by your health care provider. Make sure you discuss any questions you have with your health care provider. Document Released: 02/23/2013 Document Revised: 12/01/2017 Document Reviewed: 12/01/2017  Elsevier Patient Education  [place].

## 2018-12-14 NOTE — Patient Instructions (Signed)
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 El sistema linftico forma parte del sistema de defensa del organismo (sistema inmunitario). Protege al Denise Walls de:  Infecciones.  Grmenes.  Enfermedades. El linfoma no hodgkiniano afecta a un tipo de glbulo blanco llamado linfocito. Hay muchos tipos diferentes de LNH. El LNH puede ocurrir en los linfocitos B (clulas B), en los linfocitos T (clulas T) y en los linfocitos citolticos naturales. El tipo de linfoma no hodgkiniano que padece depende de la clase de clulas que este afecta y de la rapidez con la que crece y se disemina. Cules son las causas? Se desconoce la causa de esta afeccin. Qu incrementa el riesgo? Es ms probable que sufra esta afeccin si:  Tiene un sistema inmunitario dbil, especialmente despus de recibir un rgano de un donante (trasplante).  Tiene una enfermedad autoinmunitaria como artritis reumatoide.  Tiene infecciones a causa de virus o bacterias. Estos incluyen los siguientes: ? Virus de inmunodeficiencia humana (VIH). ? Virus de Epstein-Barr.  Tiene ms de 60aos.  Es hombre.  Es Denise Walls recibido tratamiento con rayos de alta potencia que destruyen las clulas cancerosas(radioterapia) para otros tipos de cncer. Cules son los signos o los sntomas? Los sntomas de esta afeccin dependen del tipo, del lugar donde se encuentra en el cuerpo, y de si es de crecimiento rpido o de crecimiento lento. Algunas personas quizs no tengan sntomas. Los sntomas frecuentes incluyen los siguientes:  Hinchazn de los grupos de tejido que filtran bacterias, virus y sustancias de desecho del torrente sanguneo (ganglios linfticos) en el cuello, las axilas o la ingle.  Denise Walls.  Prdida de peso sin causa aparente.  Sudoracin nocturna.  Cansancio (fatiga). Cmo se diagnostica? Esta afeccin se puede  diagnosticar en funcin de lo siguiente:  Un examen fsico y antecedentes mdicos.  Radiografa de trax.  Estudios de: ? Tejido de sus ganglios linfticos o su mdula sea (biopsia). ? Lquido cefalorraqudeo (puncin lumbar o puncin espinal). ? Lquido abdominal o torcico. ? Sangre. ? Orina.  Pruebas de diagnstico por imgenes, por ejemplo: ? Exploracin por tomografa computarizada (TC). ? Una tomografa PET (tomografa por emisin de positrones). ? Resonancia magntica (RM). El mdico har ms pruebas para determinar si el cncer se ha diseminado, adnde se ha diseminado y qu tan grave es. Esto se denomina estadificacin. Es posible que deba realizarse otras pruebas para buscar ciertas protenas o mutaciones genticas que pueden ayudar con la planificacin del Denise Walls. Cmo se trata? El tratamiento de esta afeccin depende de sus sntomas, el tipo y el estadio del LNH, y la velocidad con la que se est propagando. El tratamiento puede incluir:  Radioterapia.  Quimioterapia. Este tratamiento [place] medicamentos para destruir las clulas cancerosas.  Terapia biolgica. Esto ayuda a Research scientist (physical sciences) del cncer al estimular el sistema inmunitario del organismo.  Medicamentos dirigidos para tratar protenas o mutaciones genticas especficas que estn presentes en el linfoma.  Trasplante de clulas madre o mdula sea. En este procedimiento, usted recibe dosis altas de quimioterapia seguidas de un trasplante de mdula sea o de clulas madre de un donante sano.  Participacin en estudios clnicos para comprobar si existen tratamientos nuevos (experimentales) que sean efectivos.  Ciruga para extirpar el linfoma.  Sangre o plaquetas de un donante (transfusin). Esto puede hacerse si sus recuentos sanguneos son bajos. Siga estas instrucciones en su casa: Medicamentos  Use los medicamentos de venta libre y los recetados solamente como se  lo haya indicado el mdico.   No tome suplementos nutricionales ni medicamentos a base de hierbas a menos que el mdico se lo indique. Algunos suplementos pueden interferir en los efectos de su tratamiento. Estilo de vida  Duerma lo suficiente. [employer] de los adultos necesitan entre seis y ocho horas de sueo todas las noches. Durante el tratamiento, es posible que necesite dormir ms.  Considere la posibilidad de participar en algn grupo de apoyo para Theatre manager. Solicite al mdico ms informacin [employer] de apoyo locales y en lnea. Instrucciones generales   Beba suficiente lquido como para mantener la orina de color amarillo plido.  Lvese las manos frecuentemente con agua y [place]. Use desinfectante para manos si no dispone de [place].  Informe a su equipo de atencin oncolgica si presenta efectos secundarios debido al [employer]. Podran recomendarle algunos modos de tratarlos.  Concurra a todas las visitas de seguimiento como se lo haya indicado el mdico. Esto es importante. Dnde buscar ms informacin  American Cancer Society (Sociedad Estadounidense del Cncer): www.cancer.org  Leukemia and Lymphoma Society (Sociedad de [place] contra la Leucemia y el Linfoma): PreviewPal.pl  National Cancer Institute [NCI] ([place]): www.cancer.gov Comunquese con un mdico si:  Tiene bultos nuevos debajo de los brazos, en el cuello o cerca de la ingle.  Tiene ganglios linfticos dolorosos.  Siente nuseas o vmitos.  Sufre estreimiento o diarrea.  Tiene problemas para orinar o dolor al [employer].  Presenta una erupcin cutnea.  Tiene dolor abdominal.  Tiene dolor articular.  Se siente mareado o aturdido.  Tiene fiebre o escalofros. Solicite ayuda inmediatamente si:  Tiene dificultad para respirar o Tourist information centre manager.  Tiene sangre en la orina o en las heces. Resumen  El linfoma no hodgkiniano (LNH) es un cncer del sistema linftico. El sistema  linftico forma parte del sistema de defensa de su organismo (sistema inmunitario).  El tratamiento de esta afeccin depende de sus sntomas, el tipo y el estadio del LNH, y la velocidad con la que se est propagando.  Informe a su equipo de atencin oncolgica si presenta efectos secundarios debido al [employer]. Podran recomendarle algunos modos de tratarlos.  Comunquese con su mdico si tiene bultos nuevos debajo de los brazos, en el cuello o cerca de la ingle. Esta informacin no tiene Marine scientist el consejo del mdico. Asegrese de hacerle al mdico cualquier pregunta que tenga. Document Released: 08/21/2008 Document Revised: 08/24/2018 Document Reviewed: 08/24/2018 Elsevier Patient Education  [place].

## 2018-12-14 NOTE — Telephone Encounter (Signed)
Chemo ed appt added per 7/28 los I am awaiting Prior Auth before I can schedule R-[facility] treatment for 8/4 .  I will notify patient once this has been approved per 7/27 staff message

## 2018-12-14 NOTE — Addendum Note (Signed)
Addended by: Melton Krebs on: 12/14/2018 11:54 AM   Modules accepted: Orders, SmartSet

## 2018-12-15 ENCOUNTER — Encounter: Payer: Self-pay | Admitting: *Deleted

## 2018-12-15 ENCOUNTER — Telehealth: Payer: Self-pay | Admitting: Hematology

## 2018-12-15 NOTE — Telephone Encounter (Signed)
Called and LMVM for Denise Walls regarding changes made to her mother's schedule.  I did ask her to call me back to confirm these changes per 7/27 staff msg

## 2018-12-20 ENCOUNTER — Inpatient Hospital Stay: Payer: Commercial Managed Care - PPO | Attending: Hematology

## 2018-12-20 ENCOUNTER — Telehealth: Payer: Self-pay | Admitting: *Deleted

## 2018-12-20 ENCOUNTER — Encounter: Payer: Self-pay | Admitting: *Deleted

## 2018-12-20 ENCOUNTER — Other Ambulatory Visit: Payer: Self-pay

## 2018-12-20 ENCOUNTER — Other Ambulatory Visit: Payer: Self-pay | Admitting: Family

## 2018-12-20 DIAGNOSIS — E860 Dehydration: Secondary | ICD-10-CM | POA: Insufficient documentation

## 2018-12-20 DIAGNOSIS — Z5189 Encounter for other specified aftercare: Secondary | ICD-10-CM | POA: Insufficient documentation

## 2018-12-20 DIAGNOSIS — E871 Hypo-osmolality and hyponatremia: Secondary | ICD-10-CM | POA: Insufficient documentation

## 2018-12-20 DIAGNOSIS — C8339 Diffuse large B-cell lymphoma, extranodal and solid organ sites: Secondary | ICD-10-CM | POA: Insufficient documentation

## 2018-12-20 DIAGNOSIS — Z5111 Encounter for antineoplastic chemotherapy: Secondary | ICD-10-CM | POA: Insufficient documentation

## 2018-12-20 DIAGNOSIS — Z5112 Encounter for antineoplastic immunotherapy: Secondary | ICD-10-CM | POA: Insufficient documentation

## 2018-12-20 NOTE — Telephone Encounter (Signed)
Call received with Denise Walls from [facility] stating that she will be the case manager for coordination of care for patient.  [name]'s contact number is [phone], [phone].

## 2018-12-20 NOTE — Progress Notes (Signed)
Patient here for chemo education, to start treatment tomorrow. Overall she is doing well except for anxiety and decreased input. She does not currently have medication for anxiety so message has been sent to Dr Maylon Peppers to be addressed at her appointment tomorrow. Also reviewed her scans which show significant adenopathy in chest and abdomen which likely explains patient's symptoms of fullness. Reviewed how lymphoma is usually quickly responsive to chemo and the feeling of fullness will likely improve after chemo.   She has my contact number if needed. Her daughter is present with her in the room. Denise Walls will proceed with chemo education.

## 2018-12-20 NOTE — Progress Notes (Signed)
Patient in chemotherapy education class with an interpreter and daughter Delana Meyer . Discussed side effects of Rituxan, Cytoxan, Vincristine, Prednisone, Adriamycin  which include but are not limited to myelosuppression, decreased appetite, fatigue, fever, allergic or infusional reaction, mucositis, cardiac toxicity, cough, SOB, altered taste, nausea and vomiting, diarrhea, constipation, elevated LFTs myalgia and arthralgias, hair loss or thinning, rash, skin dryness, nail changes, peripheral neuropathy, discolored urine, delayed wound healing, mental changes (Chemo brain), increased risk of infections, weight loss.  Reviewed infusion room and office policy and procedure and phone numbers 24 hours x 7 days a week.  .  Reviewed when to call the office with any concerns or problems.  Scientist, clinical (histocompatibility and immunogenetics) given.  Discussed portacath insertion and EMLA cream administration.  Antiemetic protocol and chemotherapy schedule reviewed. Patient verbalized understanding of chemotherapy indications and possible side effects.  Teachback done

## 2018-12-21 ENCOUNTER — Inpatient Hospital Stay: Payer: Commercial Managed Care - PPO

## 2018-12-21 ENCOUNTER — Encounter: Payer: Self-pay | Admitting: *Deleted

## 2018-12-21 ENCOUNTER — Other Ambulatory Visit: Payer: Self-pay | Admitting: Hematology

## 2018-12-21 ENCOUNTER — Other Ambulatory Visit: Payer: Commercial Managed Care - PPO

## 2018-12-21 ENCOUNTER — Ambulatory Visit (HOSPITAL_BASED_OUTPATIENT_CLINIC_OR_DEPARTMENT_OTHER)
Admission: RE | Admit: 2018-12-21 | Discharge: 2018-12-21 | Disposition: A | Discharge status: Home / Self Care | Payer: Commercial Managed Care - PPO | Source: Ambulatory Visit | Attending: Hematology | Admitting: Hematology

## 2018-12-21 VITALS — BP 144/77 | HR 107 | Temp 97.7°F | Resp 18

## 2018-12-21 DIAGNOSIS — Z5189 Encounter for other specified aftercare: Secondary | ICD-10-CM | POA: Diagnosis not present

## 2018-12-21 DIAGNOSIS — R0600 Dyspnea, unspecified: Secondary | ICD-10-CM

## 2018-12-21 DIAGNOSIS — G4700 Insomnia, unspecified: Secondary | ICD-10-CM

## 2018-12-21 DIAGNOSIS — Z5111 Encounter for antineoplastic chemotherapy: Secondary | ICD-10-CM | POA: Diagnosis present

## 2018-12-21 DIAGNOSIS — C8339 Diffuse large B-cell lymphoma, extranodal and solid organ sites: Secondary | ICD-10-CM | POA: Diagnosis present

## 2018-12-21 DIAGNOSIS — Z5112 Encounter for antineoplastic immunotherapy: Secondary | ICD-10-CM | POA: Diagnosis not present

## 2018-12-21 DIAGNOSIS — E871 Hypo-osmolality and hyponatremia: Secondary | ICD-10-CM | POA: Diagnosis not present

## 2018-12-21 DIAGNOSIS — E860 Dehydration: Secondary | ICD-10-CM | POA: Diagnosis not present

## 2018-12-21 LAB — CBC WITH DIFFERENTIAL (CANCER CENTER ONLY)
Abs Immature Granulocytes: 0.03 10*3/uL (ref 0.00–0.07)
Basophils Absolute: 0 10*3/uL (ref 0.0–0.1)
Basophils Relative: 1 %
Eosinophils Absolute: 0 10*3/uL (ref 0.0–0.5)
Eosinophils Relative: 0 %
HCT: 33.6 % — ABNORMAL LOW (ref 36.0–46.0)
Hemoglobin: 10.7 g/dL — ABNORMAL LOW (ref 12.0–15.0)
Immature Granulocytes: 0 %
Lymphocytes Relative: 8 %
Lymphs Abs: 0.6 10*3/uL — ABNORMAL LOW (ref 0.7–4.0)
MCH: 25.9 pg — ABNORMAL LOW (ref 26.0–34.0)
MCHC: 31.8 g/dL (ref 30.0–36.0)
MCV: 81.4 fL (ref 80.0–100.0)
Monocytes Absolute: 0.3 10*3/uL (ref 0.1–1.0)
Monocytes Relative: 4 %
Neutro Abs: 6.4 10*3/uL (ref 1.7–7.7)
Neutrophils Relative %: 87 %
Platelet Count: 380 10*3/uL (ref 150–400)
RBC: 4.13 MIL/uL (ref 3.87–5.11)
RDW: 16.9 % — ABNORMAL HIGH (ref 11.5–15.5)
WBC Count: 7.3 10*3/uL (ref 4.0–10.5)
nRBC: 0 % (ref 0.0–0.2)

## 2018-12-21 LAB — CMP (CANCER CENTER ONLY)
ALT: 19 U/L (ref 0–44)
AST: 20 U/L (ref 15–41)
Albumin: 3.4 g/dL — ABNORMAL LOW (ref 3.5–5.0)
Alkaline Phosphatase: 70 U/L (ref 38–126)
Anion gap: 11 (ref 5–15)
BUN: 11 mg/dL (ref 6–20)
CO2: 26 mmol/L (ref 22–32)
Calcium: 8.3 mg/dL — ABNORMAL LOW (ref 8.9–10.3)
Chloride: 96 mmol/L — ABNORMAL LOW (ref 98–111)
Creatinine: 1.21 mg/dL — ABNORMAL HIGH (ref 0.44–1.00)
GFR, Est AFR Am: 57 mL/min — ABNORMAL LOW (ref >60)
GFR, Estimated: 49 mL/min — ABNORMAL LOW (ref >60)
Glucose, Bld: 116 mg/dL — ABNORMAL HIGH (ref 70–99)
Potassium: 3.9 mmol/L (ref 3.5–5.1)
Sodium: 133 mmol/L — ABNORMAL LOW (ref 135–145)
Total Bilirubin: 0.3 mg/dL (ref 0.3–1.2)
Total Protein: 6.1 g/dL — ABNORMAL LOW (ref 6.5–8.1)

## 2018-12-21 MED ORDER — SODIUM CHLORIDE 0.9 % IV SOLN
Freq: Once | INTRAVENOUS | Status: AC
  Administered 2018-12-21: 09:00:00 via INTRAVENOUS
  Filled 2018-12-21: qty 250

## 2018-12-21 MED ORDER — DOXORUBICIN HCL CHEMO IV INJECTION 2 MG/ML
50.0000 mg/m2 | Freq: Once | INTRAVENOUS | Status: AC
  Administered 2018-12-21: 80 mg via INTRAVENOUS
  Filled 2018-12-21: qty 40

## 2018-12-21 MED ORDER — PALONOSETRON HCL INJECTION 0.25 MG/5ML
0.2500 mg | Freq: Once | INTRAVENOUS | Status: AC
  Administered 2018-12-21: 0.25 mg via INTRAVENOUS

## 2018-12-21 MED ORDER — SODIUM CHLORIDE 0.9 % IV SOLN
375.0000 mg/m2 | Freq: Once | INTRAVENOUS | Status: AC
  Administered 2018-12-21: 600 mg via INTRAVENOUS
  Filled 2018-12-21: qty 10

## 2018-12-21 MED ORDER — DIPHENHYDRAMINE HCL 25 MG PO CAPS
ORAL_CAPSULE | ORAL | Status: AC
  Filled 2018-12-21: qty 2

## 2018-12-21 MED ORDER — SODIUM CHLORIDE 0.9% FLUSH
10.0000 mL | INTRAVENOUS | Status: DC | PRN
  Administered 2018-12-21: 10 mL
  Filled 2018-12-21: qty 10

## 2018-12-21 MED ORDER — ACETAMINOPHEN 325 MG PO TABS
ORAL_TABLET | ORAL | Status: AC
  Filled 2018-12-21: qty 2

## 2018-12-21 MED ORDER — SODIUM CHLORIDE 0.9 % IV SOLN
750.0000 mg/m2 | Freq: Once | INTRAVENOUS | Status: AC
  Administered 2018-12-21: 12:00:00 1200 mg via INTRAVENOUS
  Filled 2018-12-21: qty 60

## 2018-12-21 MED ORDER — PALONOSETRON HCL INJECTION 0.25 MG/5ML
INTRAVENOUS | Status: AC
  Filled 2018-12-21: qty 5

## 2018-12-21 MED ORDER — VINCRISTINE SULFATE CHEMO INJECTION 1 MG/ML
2.0000 mg | Freq: Once | INTRAVENOUS | Status: AC
  Administered 2018-12-21: 2 mg via INTRAVENOUS
  Filled 2018-12-21: qty 2

## 2018-12-21 MED ORDER — SODIUM CHLORIDE 0.9 % IV SOLN
Freq: Once | INTRAVENOUS | Status: AC
  Administered 2018-12-21: 10:00:00 via INTRAVENOUS
  Filled 2018-12-21: qty 5

## 2018-12-21 MED ORDER — TRAZODONE HCL 50 MG PO TABS: 50.0000 mg | ORAL_TABLET | Freq: Every evening | ORAL | 3 refills | Status: DC | PRN

## 2018-12-21 MED ORDER — HEPARIN SOD (PORK) LOCK FLUSH 100 UNIT/ML IV SOLN
500.0000 [IU] | Freq: Once | INTRAVENOUS | Status: AC | PRN
  Administered 2018-12-21: 500 [IU]
  Filled 2018-12-21: qty 5

## 2018-12-21 MED ORDER — DIPHENHYDRAMINE HCL 25 MG PO CAPS
50.0000 mg | ORAL_CAPSULE | Freq: Once | ORAL | Status: AC
  Administered 2018-12-21: 50 mg via ORAL

## 2018-12-21 MED ORDER — ACETAMINOPHEN 325 MG PO TABS
650.0000 mg | ORAL_TABLET | Freq: Once | ORAL | Status: AC
  Administered 2018-12-21: 12:00:00 650 mg via ORAL

## 2018-12-21 NOTE — Patient Instructions (Signed)
Implanted Port Insertion, Care After This sheet gives you information about how to care for yourself after your procedure. Your health care provider may also give you more specific instructions. If you have problems or questions, contact your health care provider. What can I expect after the procedure? After the procedure, it is common to have:  Discomfort at the port insertion site.  Bruising on the skin over the port. This should improve over 3-4 days. Follow these instructions at home: [facility] care  After your port is placed, you will get a manufacturer's information card. The card has information about your port. Keep this card with you at all times.  Take care of the port as told by your health care provider. Ask your health care provider if you or a family member can get training for taking care of the port at home. A home health care nurse may also take care of the port.  Make sure to remember what type of port you have. Incision care      Follow instructions from your health care provider about how to take care of your port insertion site. Make sure you: ? Wash your hands with soap and water before and after you change your bandage (dressing). If soap and water are not available, use hand sanitizer. ? Change your dressing as told by your health care provider. ? Leave stitches (sutures), skin glue, or adhesive strips in place. These skin closures may need to stay in place for 2 weeks or longer. If adhesive strip edges start to loosen and curl up, you may trim the loose edges. Do not remove adhesive strips completely unless your health care provider tells you to do that.  Check your port insertion site every day for signs of infection. Check for: ? Redness, swelling, or pain. ? Fluid or blood. ? Warmth. ? Pus or a bad smell. Activity  Return to your normal activities as told by your health care provider. Ask your health care provider what activities are safe for you.  Do not  lift anything that is heavier than 10 lb (4.5 kg), or the limit that you are told, until your health care provider says that it is safe. General instructions  Take over-the-counter and prescription medicines only as told by your health care provider.  Do not take baths, swim, or use a hot tub until your health care provider approves. Ask your health care provider if you may take showers. You may only be allowed to take sponge baths.  Do not drive for 24 hours if you were given a sedative during your procedure.  Wear a medical alert bracelet in case of an emergency. This will tell any health care providers that you have a port.  Keep all follow-up visits as told by your health care provider. This is important. Contact a health care provider if:  You cannot flush your port with saline as directed, or you cannot draw blood from the port.  You have a fever or chills.  You have redness, swelling, or pain around your port insertion site.  You have fluid or blood coming from your port insertion site.  Your port insertion site feels warm to the touch.  You have pus or a bad smell coming from the port insertion site. Get help right away if:  You have chest pain or shortness of breath.  You have bleeding from your port that you cannot control. Summary  Take care of the port as told by your health  care provider. Keep the manufacturer's information card with you at all times.  Change your dressing as told by your health care provider.  Contact a health care provider if you have a fever or chills or if you have redness, swelling, or pain around your port insertion site.  Keep all follow-up visits as told by your health care provider. This information is not intended to replace advice given to you by your health care provider. Make sure you discuss any questions you have with your health care provider. Document Released: 02/23/2013 Document Revised: 12/01/2017 Document Reviewed: 12/01/2017  Elsevier Patient Education  [place].

## 2018-12-21 NOTE — Patient Instructions (Addendum)
[facility] Discharge Instructions for Patients Receiving Chemotherapy  Today you received the following chemotherapy agents Rituxan, Cytoxan, Adriamycin, Vincristine,   To help prevent nausea and vomiting after your treatment, we encourage you to take your nausea medication   1) Can take Prochlorperazine (Compazine) by mouth every 6 hours AS NEEDED for nausea at any time  2) Beginning Friday August 7, if still experiencing nausea can take Ondansetron (Zofran) twice daily as needed for nausea /vomiting.    3) Begin taking Prednisone every day in the morning with food for 5 days.    If you develop nausea and vomiting that is not controlled by your nausea medication, call the clinic.   BELOW ARE SYMPTOMS THAT SHOULD BE REPORTED IMMEDIATELY:  *FEVER GREATER THAN 100.5 F  *CHILLS WITH OR WITHOUT FEVER  NAUSEA AND VOMITING THAT IS NOT CONTROLLED WITH YOUR NAUSEA MEDICATION  *UNUSUAL SHORTNESS OF BREATH  *UNUSUAL BRUISING OR BLEEDING  TENDERNESS IN MOUTH AND THROAT WITH OR WITHOUT PRESENCE OF ULCERS  *URINARY PROBLEMS  *BOWEL PROBLEMS  UNUSUAL RASH Items with * indicate a potential emergency and should be followed up as soon as possible.  Feel free to call the clinic should you have any questions or concerns. The clinic phone number is [phone].  Please show the [place] at check-in to the Emergency Department and triage nurse.   Rituximab injection What is this medicine? RITUXIMAB (ri TUX i mab) is a monoclonal antibody. It is used to treat certain types of cancer like non-Hodgkin lymphoma and chronic lymphocytic leukemia. It is also used to treat rheumatoid arthritis, granulomatosis with polyangiitis (or Wegener's granulomatosis), microscopic polyangiitis, and pemphigus vulgaris. This medicine may be used for other purposes; ask your health care provider or pharmacist if you have questions. COMMON BRAND NAME(S): Rituxan, RUXIENCE What should I  tell my health care provider before I take this medicine? They need to know if you have any of these conditions:  heart disease  infection (especially a virus infection such as hepatitis B, chickenpox, cold sores, or herpes)  immune system problems  irregular heartbeat  kidney disease  low blood counts, like low white cell, platelet, or red cell counts  lung or breathing disease, like asthma  recently received or scheduled to receive a vaccine  an unusual or allergic reaction to rituximab, other medicines, foods, dyes, or preservatives  pregnant or trying to get pregnant  breast-feeding How should I use this medicine? This medicine is for infusion into a vein. It is administered in a hospital or clinic by a specially trained health care professional. A special MedGuide will be given to you by the pharmacist with each prescription and refill. Be sure to read this information carefully each time. Talk to your pediatrician regarding the use of this medicine in children. This medicine is not approved for use in children. Overdosage: If you think you have taken too much of this medicine contact a poison control center or emergency room at once. NOTE: This medicine is only for you. Do not share this medicine with others. What if I miss a dose? It is important not to miss a dose. Call your doctor or health care professional if you are unable to keep an appointment. What may interact with this medicine?  cisplatin  live virus vaccines This list may not describe all possible interactions. Give your health care provider a list of all the medicines, herbs, non-prescription drugs, or dietary supplements you use. Also tell them if  you smoke, drink alcohol, or use illegal drugs. Some items may interact with your medicine. What should I watch for while using this medicine? Your condition will be monitored carefully while you are receiving this medicine. You may need blood work done while you  are taking this medicine. This medicine can cause serious allergic reactions. To reduce your risk you may need to take medicine before treatment with this medicine. Take your medicine as directed. In some patients, this medicine may cause a serious brain infection that may cause death. If you have any problems seeing, thinking, speaking, walking, or standing, tell your healthcare professional right away. If you cannot reach your healthcare professional, urgently seek other source of medical care. Call your doctor or health care professional for advice if you get a fever, chills or sore throat, or other symptoms of a cold or flu. Do not treat yourself. This drug decreases your body's ability to fight infections. Try to avoid being around people who are sick. Do not become pregnant while taking this medicine or for at least 12 months after stopping it. Women should inform their doctor if they wish to become pregnant or think they might be pregnant. There is a potential for serious side effects to an unborn child. Talk to your health care professional or pharmacist for more information. Do not breast-feed an infant while taking this medicine or for at least 6 months after stopping it. What side effects may I notice from receiving this medicine? Side effects that you should report to your doctor or health care professional as soon as possible:  allergic reactions like skin rash, itching or hives; swelling of the face, lips, or tongue  breathing problems  chest pain  changes in vision  diarrhea  headache with fever, neck stiffness, sensitivity to light, nausea, or confusion  fast, irregular heartbeat  loss of memory  low blood counts - this medicine may decrease the number of white blood cells, red blood cells and platelets. You may be at increased risk for infections and bleeding.  mouth sores  problems with balance, talking, or walking  redness, blistering, peeling or loosening of the  skin, including inside the mouth  signs of infection - fever or chills, cough, sore throat, pain or difficulty passing urine  signs and symptoms of kidney injury like trouble passing urine or change in the amount of urine  signs and symptoms of liver injury like dark yellow or brown urine; general ill feeling or flu-like symptoms; light-colored stools; loss of appetite; nausea; right upper belly pain; unusually weak or tired; yellowing of the eyes or skin  signs and symptoms of low blood pressure like dizziness; feeling faint or lightheaded, falls; unusually weak or tired  stomach pain  swelling of the ankles, feet, hands  unusual bleeding or bruising  vomiting Side effects that usually do not require medical attention (report to your doctor or health care professional if they continue or are bothersome):  headache  joint pain  muscle cramps or muscle pain  nausea  tiredness This list may not describe all possible side effects. Call your doctor for medical advice about side effects. You may report side effects to FDA at 1-800-FDA-1088. Where should I keep my medicine? This drug is given in a hospital or clinic and will not be stored at home. NOTE: This sheet is a summary. It may not cover all possible information. If you have questions about this medicine, talk to your doctor, pharmacist, or health care provider.  2020 Elsevier/Gold Standard (2018-06-16 22:01:36) Doxorubicin injection What is this medicine? DOXORUBICIN (dox oh ROO bi sin) is a chemotherapy drug. It is used to treat many kinds of cancer like leukemia, lymphoma, neuroblastoma, sarcoma, and Wilms' tumor. It is also used to treat bladder cancer, breast cancer, lung cancer, ovarian cancer, stomach cancer, and thyroid cancer. This medicine may be used for other purposes; ask your health care provider or pharmacist if you have questions. COMMON BRAND NAME(S): Adriamycin, Adriamycin PFS, Adriamycin RDF, Rubex What  should I tell my health care provider before I take this medicine? They need to know if you have any of these conditions:  heart disease  history of low blood counts caused by a medicine  liver disease  recent or ongoing radiation therapy  an unusual or allergic reaction to doxorubicin, other chemotherapy agents, other medicines, foods, dyes, or preservatives  pregnant or trying to get pregnant  breast-feeding How should I use this medicine? This drug is given as an infusion into a vein. It is administered in a hospital or clinic by a specially trained health care professional. If you have pain, swelling, burning or any unusual feeling around the site of your injection, tell your health care professional right away. Talk to your pediatrician regarding the use of this medicine in children. Special care may be needed. Overdosage: If you think you have taken too much of this medicine contact a poison control center or emergency room at once. NOTE: This medicine is only for you. Do not share this medicine with others. What if I miss a dose? It is important not to miss your dose. Call your doctor or health care professional if you are unable to keep an appointment. What may interact with this medicine? This medicine may interact with the following medications:  6-mercaptopurine  paclitaxel  phenytoin  St. John's Wort  trastuzumab  verapamil This list may not describe all possible interactions. Give your health care provider a list of all the medicines, herbs, non-prescription drugs, or dietary supplements you use. Also tell them if you smoke, drink alcohol, or use illegal drugs. Some items may interact with your medicine. What should I watch for while using this medicine? This drug may make you feel generally unwell. This is not uncommon, as chemotherapy can affect healthy cells as well as cancer cells. Report any side effects. Continue your course of treatment even though you feel  ill unless your doctor tells you to stop. There is a maximum amount of this medicine you should receive throughout your life. The amount depends on the medical condition being treated and your overall health. Your doctor will watch how much of this medicine you receive in your lifetime. Tell your doctor if you have taken this medicine before. You may need blood work done while you are taking this medicine. Your urine may turn red for a few days after your dose. This is not blood. If your urine is dark or brown, call your doctor. In some cases, you may be given additional medicines to help with side effects. Follow all directions for their use. Call your doctor or health care professional for advice if you get a fever, chills or sore throat, or other symptoms of a cold or flu. Do not treat yourself. This drug decreases your body's ability to fight infections. Try to avoid being around people who are sick. This medicine may increase your risk to bruise or bleed. Call your doctor or health care professional if you notice any  unusual bleeding. Talk to your doctor about your risk of cancer. You may be more at risk for certain types of cancers if you take this medicine. Do not become pregnant while taking this medicine or for 6 months after stopping it. Women should inform their doctor if they wish to become pregnant or think they might be pregnant. Men should not father a child while taking this medicine and for 6 months after stopping it. There is a potential for serious side effects to an unborn child. Talk to your health care professional or pharmacist for more information. Do not breast-feed an infant while taking this medicine. This medicine has caused ovarian failure in some women and reduced sperm counts in some men This medicine may interfere with the ability to have a child. Talk with your doctor or health care professional if you are concerned about your fertility. This medicine may cause a decrease  in Co-Enzyme Q-10. You should make sure that you get enough Co-Enzyme Q-10 while you are taking this medicine. Discuss the foods you eat and the vitamins you take with your health care professional. What side effects may I notice from receiving this medicine? Side effects that you should report to your doctor or health care professional as soon as possible:  allergic reactions like skin rash, itching or hives, swelling of the face, lips, or tongue  breathing problems  chest pain  fast or irregular heartbeat  low blood counts - this medicine may decrease the number of white blood cells, red blood cells and platelets. You may be at increased risk for infections and bleeding.  pain, redness, or irritation at site where injected  signs of infection - fever or chills, cough, sore throat, pain or difficulty passing urine  signs of decreased platelets or bleeding - bruising, pinpoint red spots on the skin, black, tarry stools, blood in the urine  swelling of the ankles, feet, hands  tiredness  weakness Side effects that usually do not require medical attention (report to your doctor or health care professional if they continue or are bothersome):  diarrhea  hair loss  mouth sores  nail discoloration or damage  nausea  red colored urine  vomiting This list may not describe all possible side effects. Call your doctor for medical advice about side effects. You may report side effects to FDA at 1-800-FDA-1088. Where should I keep my medicine? This drug is given in a hospital or clinic and will not be stored at home. NOTE: This sheet is a summary. It may not cover all possible information. If you have questions about this medicine, talk to your doctor, pharmacist, or health care provider.  2020 Elsevier/Gold Standard (2016-12-17 11:01:26) Vincristine injection What is this medicine? VINCRISTINE (vin KRIS teen) is a chemotherapy drug. It slows the growth of cancer cells. This  medicine is used to treat many types of cancer like Hodgkin's disease, leukemia, non-Hodgkin's lymphoma, neuroblastoma (brain cancer), rhabdomyosarcoma, and Wilms' tumor. This medicine may be used for other purposes; ask your health care provider or pharmacist if you have questions. COMMON BRAND NAME(S): Oncovin, Vincasar PFS What should I tell my health care provider before I take this medicine? They need to know if you have any of these conditions:  blood disorders  gout  infection (especially chickenpox, cold sores, or herpes)  kidney disease  liver disease  lung disease  nervous system disease like Charcot-Marie-Tooth (CMT)  recent or ongoing radiation therapy  an unusual or allergic reaction to vincristine, other chemotherapy  agents, other medicines, foods, dyes, or preservatives  pregnant or trying to get pregnant  breast-feeding How should I use this medicine? This drug is given as an infusion into a vein. It is administered in a hospital or clinic by a specially trained health care professional. If you have pain, swelling, burning, or any unusual feeling around the site of your injection, tell your health care professional right away. Talk to your pediatrician regarding the use of this medicine in children. While this drug may be prescribed for selected conditions, precautions do apply. Overdosage: If you think you have taken too much of this medicine contact a poison control center or emergency room at once. NOTE: This medicine is only for you. Do not share this medicine with others. What if I miss a dose? It is important not to miss your dose. Call your doctor or health care professional if you are unable to keep an appointment. What may interact with this medicine? Do not take this medicine with any of the following medications:  itraconazole  mibefradil  voriconazole This medicine may also interact with the following  medications:  cyclosporine  erythromycin  fluconazole  ketoconazole  medicines for HIV like delavirdine, efavirenz, nevirapine  medicines for seizures like ethotoin, fosphenotoin, phenytoin  medicines to increase blood counts like filgrastim, pegfilgrastim, sargramostim  other chemotherapy drugs like cisplatin, L-asparaginase, methotrexate, mitomycin, paclitaxel  pegaspargase  vaccines  zalcitabine, ddC Talk to your doctor or health care professional before taking any of these medicines:  acetaminophen  aspirin  ibuprofen  ketoprofen  naproxen This list may not describe all possible interactions. Give your health care provider a list of all the medicines, herbs, non-prescription drugs, or dietary supplements you use. Also tell them if you smoke, drink alcohol, or use illegal drugs. Some items may interact with your medicine. What should I watch for while using this medicine? Your condition will be monitored carefully while you are receiving this medicine. You will need important blood work done while you are taking this medicine. This drug may make you feel generally unwell. This is not uncommon, as chemotherapy can affect healthy cells as well as cancer cells. Report any side effects. Continue your course of treatment even though you feel ill unless your doctor tells you to stop. In some cases, you may be given additional medicines to help with side effects. Follow all directions for their use. Call your doctor or health care professional for advice if you get a fever, chills or sore throat, or other symptoms of a cold or flu. Do not treat yourself. Avoid taking products that contain aspirin, acetaminophen, ibuprofen, naproxen, or ketoprofen unless instructed by your doctor. These medicines may hide a fever. Do not become pregnant while taking this medicine. Women should inform their doctor if they wish to become pregnant or think they might be pregnant. There is a potential  for serious side effects to an unborn child. Talk to your health care professional or pharmacist for more information. Do not breast-feed an infant while taking this medicine. Men may have a lower sperm count while taking this medicine. Talk to your doctor if you plan to father a child. What side effects may I notice from receiving this medicine? Side effects that you should report to your doctor or health care professional as soon as possible:  allergic reactions like skin rash, itching or hives, swelling of the face, lips, or tongue  breathing problems  confusion or changes in emotions or moods  constipation  cough  mouth sores  muscle weakness  nausea and vomiting  pain, swelling, redness or irritation at the injection site  pain, tingling, numbness in the hands or feet  problems with balance, talking, walking  seizures  stomach pain  trouble passing urine or change in the amount of urine Side effects that usually do not require medical attention (report to your doctor or health care professional if they continue or are bothersome):  diarrhea  hair loss  jaw pain  loss of appetite This list may not describe all possible side effects. Call your doctor for medical advice about side effects. You may report side effects to FDA at 1-800-FDA-1088. Where should I keep my medicine? This drug is given in a hospital or clinic and will not be stored at home. NOTE: This sheet is a summary. It may not cover all possible information. If you have questions about this medicine, talk to your doctor, pharmacist, or health care provider.  2020 Elsevier/Gold Standard (2008-01-31 17:17:13) Cyclophosphamide injection What is this medicine? CYCLOPHOSPHAMIDE (sye kloe FOSS fa mide) is a chemotherapy drug. It slows the growth of cancer cells. This medicine is used to treat many types of cancer like lymphoma, myeloma, leukemia, breast cancer, and ovarian cancer, to name a few. This  medicine may be used for other purposes; ask your health care provider or pharmacist if you have questions. COMMON BRAND NAME(S): Cytoxan, Neosar What should I tell my health care provider before I take this medicine? They need to know if you have any of these conditions:  blood disorders  history of other chemotherapy  infection  kidney disease  liver disease  recent or ongoing radiation therapy  tumors in the bone marrow  an unusual or allergic reaction to cyclophosphamide, other chemotherapy, other medicines, foods, dyes, or preservatives  pregnant or trying to get pregnant  breast-feeding How should I use this medicine? This drug is usually given as an injection into a vein or muscle or by infusion into a vein. It is administered in a hospital or clinic by a specially trained health care professional. Talk to your pediatrician regarding the use of this medicine in children. Special care may be needed. Overdosage: If you think you have taken too much of this medicine contact a poison control center or emergency room at once. NOTE: This medicine is only for you. Do not share this medicine with others. What if I miss a dose? It is important not to miss your dose. Call your doctor or health care professional if you are unable to keep an appointment. What may interact with this medicine? This medicine may interact with the following medications:  amiodarone  amphotericin B  azathioprine  certain antiviral medicines for HIV or AIDS such as protease inhibitors (e.g., indinavir, ritonavir) and zidovudine  certain blood pressure medications such as benazepril, captopril, enalapril, fosinopril, lisinopril, moexipril, monopril, perindopril, quinapril, ramipril, trandolapril  certain cancer medications such as anthracyclines (e.g., daunorubicin, doxorubicin), busulfan, cytarabine, paclitaxel, pentostatin, tamoxifen, trastuzumab  certain diuretics such as chlorothiazide,  chlorthalidone, hydrochlorothiazide, indapamide, metolazone  certain medicines that treat or prevent blood clots like warfarin  certain muscle relaxants such as succinylcholine  cyclosporine  etanercept  indomethacin  medicines to increase blood counts like filgrastim, pegfilgrastim, sargramostim  medicines used as general anesthesia  metronidazole  natalizumab This list may not describe all possible interactions. Give your health care provider a list of all the medicines, herbs, non-prescription drugs, or dietary supplements you use. Also tell them if you  smoke, drink alcohol, or use illegal drugs. Some items may interact with your medicine. What should I watch for while using this medicine? Visit your doctor for checks on your progress. This drug may make you feel generally unwell. This is not uncommon, as chemotherapy can affect healthy cells as well as cancer cells. Report any side effects. Continue your course of treatment even though you feel ill unless your doctor tells you to stop. Drink water or other fluids as directed. Urinate often, even at night. In some cases, you may be given additional medicines to help with side effects. Follow all directions for their use. Call your doctor or health care professional for advice if you get a fever, chills or sore throat, or other symptoms of a cold or flu. Do not treat yourself. This drug decreases your body's ability to fight infections. Try to avoid being around people who are sick. This medicine may increase your risk to bruise or bleed. Call your doctor or health care professional if you notice any unusual bleeding. Be careful brushing and flossing your teeth or using a toothpick because you may get an infection or bleed more easily. If you have any dental work done, tell your dentist you are receiving this medicine. You may get drowsy or dizzy. Do not drive, use machinery, or do anything that needs mental alertness until you know how  this medicine affects you. Do not become pregnant while taking this medicine or for 1 year after stopping it. Women should inform their doctor if they wish to become pregnant or think they might be pregnant. Men should not father a child while taking this medicine and for 4 months after stopping it. There is a potential for serious side effects to an unborn child. Talk to your health care professional or pharmacist for more information. Do not breast-feed an infant while taking this medicine. This medicine may interfere with the ability to have a child. This medicine has caused ovarian failure in some women. This medicine has caused reduced sperm counts in some men. You should talk with your doctor or health care professional if you are concerned about your fertility. If you are going to have surgery, tell your doctor or health care professional that you have taken this medicine. What side effects may I notice from receiving this medicine? Side effects that you should report to your doctor or health care professional as soon as possible:  allergic reactions like skin rash, itching or hives, swelling of the face, lips, or tongue  low blood counts - this medicine may decrease the number of white blood cells, red blood cells and platelets. You may be at increased risk for infections and bleeding.  signs of infection - fever or chills, cough, sore throat, pain or difficulty passing urine  signs of decreased platelets or bleeding - bruising, pinpoint red spots on the skin, black, tarry stools, blood in the urine  signs of decreased red blood cells - unusually weak or tired, fainting spells, lightheadedness  breathing problems  dark urine  dizziness  palpitations  swelling of the ankles, feet, hands  trouble passing urine or change in the amount of urine  weight gain  yellowing of the eyes or skin Side effects that usually do not require medical attention (report to your doctor or health  care professional if they continue or are bothersome):  changes in nail or skin color  hair loss  missed menstrual periods  mouth sores  nausea, vomiting This list may  not describe all possible side effects. Call your doctor for medical advice about side effects. You may report side effects to FDA at 1-800-FDA-1088. Where should I keep my medicine? This drug is given in a hospital or clinic and will not be stored at home. NOTE: This sheet is a summary. It may not cover all possible information. If you have questions about this medicine, talk to your doctor, pharmacist, or health care provider.  2020 Elsevier/Gold Standard (2012-03-19 16:22:58) Diphenhydramine capsules or tablets What is this medicine? DIPHENHYDRAMINE (dye fen HYE dra meen) is an antihistamine. It is used to treat the symptoms of an allergic reaction. It is also used to treat Parkinson's disease. This medicine is also used to prevent and to treat motion sickness and as a nighttime sleep aid. This medicine may be used for other purposes; ask your health care provider or pharmacist if you have questions. COMMON BRAND NAME(S): Alka-Seltzer Plus Allergy, Aller-G-Time, Banophen, Benadryl Allergy, Benadryl Allergy Dye Free, Benadryl Allergy Kapgel, Benadryl Allergy Ultratab, Diphedryl, Diphenhist, Genahist, Geri-Dryl, PHARBEDRYL, Q-Dryl, Gretta Began, Valu-Dryl, Vicks ZzzQuil Nightime Sleep-Aid What should I tell my health care provider before I take this medicine? They need to know if you have any of these conditions:  asthma or lung disease  glaucoma  high blood pressure or heart disease  liver disease  pain or difficulty passing urine  prostate trouble  ulcers or other stomach problems  an unusual or allergic reaction to diphenhydramine, other medicines foods, dyes, or preservatives such as sulfites  pregnant or trying to get pregnant  breast-feeding How should I use this medicine? Take this medicine by mouth  with a full glass of water. Follow the directions on the prescription label. Take your doses at regular intervals. Do not take your medicine more often than directed. To prevent motion sickness start taking this medicine 30 to 60 minutes before you leave. Talk to your pediatrician regarding the use of this medicine in children. Special care may be needed. Patients over 65 years old may have a stronger reaction and need a smaller dose. Overdosage: If you think you have taken too much of this medicine contact a poison control center or emergency room at once. NOTE: This medicine is only for you. Do not share this medicine with others. What if I miss a dose? If you miss a dose, take it as soon as you can. If it is almost time for your next dose, take only that dose. Do not take double or extra doses. What may interact with this medicine? Do not take this medicine with any of the following medications:  MAOIs like Carbex, Eldepryl, Marplan, Nardil, and Parnate This medicine may also interact with the following medications:  alcohol  barbiturates, like phenobarbital  medicines for bladder spasm like oxybutynin, tolterodine  medicines for blood pressure  medicines for depression, anxiety, or psychotic disturbances  medicines for movement abnormalities or Parkinson's disease  medicines for sleep  other medicines for cold, cough or allergy  some medicines for the stomach like chlordiazepoxide, dicyclomine This list may not describe all possible interactions. Give your health care provider a list of all the medicines, herbs, non-prescription drugs, or dietary supplements you use. Also tell them if you smoke, drink alcohol, or use illegal drugs. Some items may interact with your medicine. What should I watch for while using this medicine? Visit your doctor or health care professional for regular check ups. Tell your doctor if your symptoms do not improve or if they get worse.  Your mouth may  get dry. Chewing sugarless gum or sucking hard candy, and drinking plenty of water may help. Contact your doctor if the problem does not go away or is severe. This medicine may cause dry eyes and blurred vision. If you wear contact lenses you may feel some discomfort. Lubricating drops may help. See your eye doctor if the problem does not go away or is severe. You may get drowsy or dizzy. Do not drive, use machinery, or do anything that needs mental alertness until you know how this medicine affects you. Do not stand or sit up quickly, especially if you are an older patient. This reduces the risk of dizzy or fainting spells. Alcohol may interfere with the effect of this medicine. Avoid alcoholic drinks. What side effects may I notice from receiving this medicine? Side effects that you should report to your doctor or health care professional as soon as possible:  allergic reactions like skin rash, itching or hives, swelling of the face, lips, or tongue  changes in vision  confused, agitated, nervous  irregular or fast heartbeat  tremor  trouble passing urine  unusual bleeding or bruising  unusually weak or tired Side effects that usually do not require medical attention (report to your doctor or health care professional if they continue or are bothersome):  constipation, diarrhea  drowsy  headache  loss of appetite  stomach upset, vomiting  thick mucous This list may not describe all possible side effects. Call your doctor for medical advice about side effects. You may report side effects to FDA at 1-800-FDA-1088. Where should I keep my medicine? Keep out of the reach of children. Store at room temperature between 15 and 30 degrees C (59 and 86 degrees F). Keep container closed tightly. Throw away any unused medicine after the expiration date. NOTE: This sheet is a summary. It may not cover all possible information. If you have questions about this medicine, talk to your doctor,  pharmacist, or health care provider.  2020 Elsevier/Gold Standard (2007-08-23 17:06:22) Acetaminophen tablets or caplets What is this medicine? ACETAMINOPHEN (a set a MEE noe fen) is a pain reliever. It is used to treat mild pain and fever. This medicine may be used for other purposes; ask your health care provider or pharmacist if you have questions. COMMON BRAND NAME(S): Aceta, Actamin, Anacin Aspirin Free, Genapap, Genebs, Mapap, Pain & Fever, Pain and Fever, PAIN RELIEF, PAIN RELIEF Extra Strength, Pain Reliever, Panadol, PHARBETOL, Q-Pap, Q-Pap Extra Strength, Tylenol, Tylenol CrushableTablet, Tylenol Extra Strength, XS No Aspirin, XS Pain Reliever What should I tell my health care provider before I take this medicine? They need to know if you have any of these conditions:  if you often drink alcohol  liver disease  an unusual or allergic reaction to acetaminophen, other medicines, foods, dyes, or preservatives  pregnant or trying to get pregnant  breast-feeding How should I use this medicine? Take this medicine by mouth with a glass of water. Follow the directions on the package or prescription label. Take your medicine at regular intervals. Do not take your medicine more often than directed. Talk to your pediatrician regarding the use of this medicine in children. While this drug may be prescribed for children as young as 50 years of age for selected conditions, precautions do apply. Overdosage: If you think you have taken too much of this medicine contact a poison control center or emergency room at once. NOTE: This medicine is only for you. Do not share this  medicine with others. What if I miss a dose? If you miss a dose, take it as soon as you can. If it is almost time for your next dose, take only that dose. Do not take double or extra doses. What may interact with this medicine?  alcohol  imatinib  isoniazid  other medicines with acetaminophen This list may not describe  all possible interactions. Give your health care provider a list of all the medicines, herbs, non-prescription drugs, or dietary supplements you use. Also tell them if you smoke, drink alcohol, or use illegal drugs. Some items may interact with your medicine. What should I watch for while using this medicine? Tell your doctor or health care professional if the pain lasts more than 10 days (5 days for children), if it gets worse, or if there is a new or different kind of pain. Also, check with your doctor if a fever lasts for more than 3 days. Do not take other medicines that contain acetaminophen with this medicine. Always read labels carefully. If you have questions, ask your doctor or pharmacist. If you take too much acetaminophen get medical help right away. Too much acetaminophen can be very dangerous and cause liver damage. Even if you do not have symptoms, it is important to get help right away. What side effects may I notice from receiving this medicine? Side effects that you should report to your doctor or health care professional as soon as possible:  allergic reactions like skin rash, itching or hives, swelling of the face, lips, or tongue  breathing problems  fever or sore throat  redness, blistering, peeling or loosening of the skin, including inside the mouth  trouble passing urine or change in the amount of urine  unusual bleeding or bruising  unusually weak or tired  yellowing of the eyes or skin Side effects that usually do not require medical attention (report to your doctor or health care professional if they continue or are bothersome):  headache  nausea, stomach upset This list may not describe all possible side effects. Call your doctor for medical advice about side effects. You may report side effects to FDA at 1-800-FDA-1088. Where should I keep my medicine? Keep out of reach of children. Store at room temperature between 20 and 25 degrees C (68 and 77 degrees  F). Protect from moisture and heat. Throw away any unused medicine after the expiration date. NOTE: This sheet is a summary. It may not cover all possible information. If you have questions about this medicine, talk to your doctor, pharmacist, or health care provider.  2020 Elsevier/Gold Standard (2012-12-27 12:54:16) Aprepitant capsules What is this medicine? APREPITANT (ap RE pi tant) is used with other medicines to prevent nausea and vomiting caused by cancer treatment (chemotherapy). This medicine may be used for other purposes; ask your health care provider or pharmacist if you have questions. COMMON BRAND NAME(S): Emend What should I tell my health care provider before I take this medicine? They need to know if you have any of these conditions:  liver disease  an unusual or allergic reaction to aprepitant, fosaprepitant, other medicines, foods, dyes, or preservatives  pregnant or trying to get pregnant  breast-feeding How should I use this medicine? Take this medicine by mouth with a glass of water. Follow the directions on the prescription label. Usually, you will take your first dose one hour before your chemotherapy begins, and then once daily in the morning for the next 2 days after your chemotherapy treatment.  This medicine may be taken with or without food. Do not take more often than directed. Talk to your pediatrician regarding the use of this medicine in children. While this drug may be prescribed for children as young as 12 years for selected conditions, precautions do apply. Overdosage: If you think you have taken too much of this medicine contact a poison control center or emergency room at once. NOTE: This medicine is only for you. Do not share this medicine with others. What if I miss a dose? If you miss a dose, take it as soon as you can. If it is almost time for your next dose, take only that dose. Do not take double or extra doses. What may interact with this  medicine? Do not take this medicine with any of these medicines:  cisapride  flibanserin  lomitapide  pimozide This medicine may also interact with the following medications:  diltiazem  female hormones, like estrogens or progestins and birth control pills  medicines for fungal infections like ketoconazole and itraconazole  medicines for HIV  medicines for seizures or to control epilepsy like carbamazepine or phenytoin  medicines used for sleep or anxiety disorders like alprazolam, diazepam, or midazolam  nefazodone  paroxetine  ranolazine  rifampin  some chemotherapy medications like etoposide, ifosfamide, vinblastine, vincristine  some antibiotics like clarithromycin, erythromycin, troleandomycin  steroid medicines like dexamethasone or methylprednisolone  tolbutamide  warfarin This list may not describe all possible interactions. Give your health care provider a list of all the medicines, herbs, non-prescription drugs, or dietary supplements you use. Also tell them if you smoke, drink alcohol, or use illegal drugs. Some items may interact with your medicine. What should I watch for while using this medicine? Do not take this medicine if you already have nausea and vomiting. Ask your health care provider what to do if you already have nausea. Birth control pills and other methods of hormonal contraception (for example, IUD or patch) may not work properly while you are taking this medicine. Use an extra method of birth control during treatment and for 1 month after your last dose of aprepitant. This medicine should not be used continuously for a long time. Visit your doctor or health care professional for regular check-ups. This medicine may change your liver function blood test results. What side effects may I notice from receiving this medicine? Side effects that you should report to your doctor or health care professional as soon as possible:  allergic reactions  like skin rash, itching or hives, swelling of the face, lips, or tongue  breathing problems  changes in heart rhythm  high or low blood pressure  rectal bleeding  serious dizziness or disorientation, confusion  sharp or severe stomach pain  sharp pain in your leg Side effects that usually do not require medical attention (report to your doctor or health care professional if they continue or are bothersome):  constipation or diarrhea  hair loss  headache  hiccups  loss of appetite  nausea  upset stomach  tiredness This list may not describe all possible side effects. Call your doctor for medical advice about side effects. You may report side effects to FDA at 1-800-FDA-1088. Where should I keep my medicine? Keep out of the reach of children. Store at room temperature between 20 and 25 degrees C (68 and 77 degrees F). Throw away any unused medicine after the expiration date. NOTE: This sheet is a summary. It may not cover all possible information. If you have questions  about this medicine, talk to your doctor, pharmacist, or health care provider.  2020 Elsevier/Gold Standard (2018-02-12 13:36:30) Palonosetron Injection What is this medicine? PALONOSETRON (pal oh NOE se tron) is used to prevent nausea and vomiting caused by chemotherapy. It also helps prevent delayed nausea and vomiting that may occur a few days after your treatment. This medicine may be used for other purposes; ask your health care provider or pharmacist if you have questions. COMMON BRAND NAME(S): Aloxi What should I tell my health care provider before I take this medicine? They need to know if you have any of these conditions:  an unusual or allergic reaction to palonosetron, dolasetron, granisetron, ondansetron, other medicines, foods, dyes, or preservatives  pregnant or trying to get pregnant  breast-feeding How should I use this medicine? This medicine is for infusion into a vein. It is given  by a health care professional in a hospital or clinic setting. Talk to your pediatrician regarding the use of this medicine in children. While this drug may be prescribed for children as young as 1 month for selected conditions, precautions do apply. Overdosage: If you think you have taken too much of this medicine contact a poison control center or emergency room at once. NOTE: This medicine is only for you. Do not share this medicine with others. What if I miss a dose? This does not apply. What may interact with this medicine?  certain medicines for depression, anxiety, or psychotic disturbances  fentanyl  linezolid  MAOIs like Carbex, Eldepryl, Marplan, Nardil, and Parnate  methylene blue (injected into a vein)  tramadol This list may not describe all possible interactions. Give your health care provider a list of all the medicines, herbs, non-prescription drugs, or dietary supplements you use. Also tell them if you smoke, drink alcohol, or use illegal drugs. Some items may interact with your medicine. What should I watch for while using this medicine? Your condition will be monitored carefully while you are receiving this medicine. What side effects may I notice from receiving this medicine? Side effects that you should report to your doctor or health care professional as soon as possible:  allergic reactions like skin rash, itching or hives, swelling of the face, lips, or tongue  breathing problems  confusion  dizziness  fast, irregular heartbeat  fever and chills  loss of balance or coordination  seizures  sweating  swelling of the hands and feet  tremors  unusually weak or tired Side effects that usually do not require medical attention (report to your doctor or health care professional if they continue or are bothersome):  constipation or diarrhea  headache This list may not describe all possible side effects. Call your doctor for medical advice about side  effects. You may report side effects to FDA at 1-800-FDA-1088. Where should I keep my medicine? This drug is given in a hospital or clinic and will not be stored at home. NOTE: This sheet is a summary. It may not cover all possible information. If you have questions about this medicine, talk to your doctor, pharmacist, or health care provider.  2020 Elsevier/Gold Standard (2013-03-11 10:38:36)

## 2018-12-21 NOTE — Progress Notes (Signed)
Per dr Marin Olp okay to use c-met from 7/28/ 2020 for treatment today  Unable to use scanner for vincristine, programmed manually.

## 2018-12-22 ENCOUNTER — Other Ambulatory Visit: Payer: Self-pay | Admitting: *Deleted

## 2018-12-22 ENCOUNTER — Other Ambulatory Visit: Payer: Self-pay | Admitting: Hematology

## 2018-12-22 ENCOUNTER — Encounter: Payer: Self-pay | Admitting: *Deleted

## 2018-12-22 DIAGNOSIS — C8330 Diffuse large B-cell lymphoma, unspecified site: Secondary | ICD-10-CM

## 2018-12-22 DIAGNOSIS — J910 Malignant pleural effusion: Secondary | ICD-10-CM

## 2018-12-22 NOTE — Progress Notes (Signed)
Patient needs a thoracentesis. She will also need a COVID test with results prior to procedure. Called [facility] and was able to schedule the thoracentesis for Friday, however they have no testing availability prior to procedure.   Our lab is unable to provide testing. Called the ED downstairs, and they stated they would be able to provide testing. The MD would need to place the order and then the RN in this office would have to perform screen, but if sample was brought back to the ED, they would run the specimen for [place].   Notified Dr Maylon Peppers of process and request for order.   Once everything scheduled, called the patient's daughter to inform her of orders and schedule including date, time and location.

## 2018-12-23 ENCOUNTER — Other Ambulatory Visit: Payer: Self-pay

## 2018-12-23 ENCOUNTER — Encounter: Payer: Self-pay | Admitting: *Deleted

## 2018-12-23 ENCOUNTER — Other Ambulatory Visit: Payer: Self-pay | Admitting: Hematology

## 2018-12-23 ENCOUNTER — Inpatient Hospital Stay: Payer: Commercial Managed Care - PPO

## 2018-12-23 ENCOUNTER — Other Ambulatory Visit: Payer: Self-pay | Admitting: *Deleted

## 2018-12-23 VITALS — BP 149/83 | HR 88 | Temp 97.7°F | Resp 18

## 2018-12-23 DIAGNOSIS — Z5112 Encounter for antineoplastic immunotherapy: Secondary | ICD-10-CM | POA: Diagnosis not present

## 2018-12-23 DIAGNOSIS — C8339 Diffuse large B-cell lymphoma, extranodal and solid organ sites: Secondary | ICD-10-CM

## 2018-12-23 DIAGNOSIS — J910 Malignant pleural effusion: Secondary | ICD-10-CM

## 2018-12-23 MED ORDER — SODIUM CHLORIDE 0.9 % IV SOLN
Freq: Once | INTRAVENOUS | Status: AC
  Administered 2018-12-23: 09:00:00 via INTRAVENOUS
  Filled 2018-12-23: qty 250

## 2018-12-23 MED ORDER — PEGFILGRASTIM-CBQV 6 MG/0.6ML ~~LOC~~ SOSY
PREFILLED_SYRINGE | SUBCUTANEOUS | Status: AC
  Filled 2018-12-23: qty 0.6

## 2018-12-23 MED ORDER — HEPARIN SOD (PORK) LOCK FLUSH 100 UNIT/ML IV SOLN
500.0000 [IU] | Freq: Once | INTRAVENOUS | Status: AC | PRN
  Administered 2018-12-23: 500 [IU]
  Filled 2018-12-23: qty 5

## 2018-12-23 MED ORDER — SODIUM CHLORIDE 0.9% FLUSH
10.0000 mL | Freq: Once | INTRAVENOUS | Status: AC | PRN
  Administered 2018-12-23: 10 mL
  Filled 2018-12-23: qty 10

## 2018-12-23 MED ORDER — PEGFILGRASTIM-CBQV 6 MG/0.6ML ~~LOC~~ SOSY
6.0000 mg | PREFILLED_SYRINGE | Freq: Once | SUBCUTANEOUS | Status: AC
  Administered 2018-12-23: 6 mg via SUBCUTANEOUS

## 2018-12-23 NOTE — Patient Instructions (Addendum)
Dehydration, Adult  Dehydration is when there is not enough fluid or water in your body. This happens when you lose more fluids than you take in. Dehydration can range from mild to very bad. It should be treated right away to keep it from getting very bad. Symptoms of mild dehydration may include:  Thirst.  Dry lips.  Slightly dry mouth.  Dry, warm skin.  Dizziness. Symptoms of moderate dehydration may include:  Very dry mouth.  Muscle cramps.  Dark pee (urine). Pee may be the color of tea.  Your body making less pee.  Your eyes making fewer tears.  Heartbeat that is uneven or faster than normal (palpitations).  Headache.  Light-headedness, especially when you stand up from sitting.  Fainting (syncope). Symptoms of very bad dehydration may include:  Changes in skin, such as: ? Cold and clammy skin. ? Blotchy (mottled) or pale skin. ? Skin that does not quickly return to normal after being lightly pinched and let go (poor skin turgor).  Changes in body fluids, such as: ? Feeling very thirsty. ? Your eyes making fewer tears. ? Not sweating when body temperature is high, such as in hot weather. ? Your body making very little pee.  Changes in vital signs, such as: ? Weak pulse. ? Pulse that is more than 100 beats a minute when you are sitting still. ? Fast breathing. ? Low blood pressure.  Other changes, such as: ? Sunken eyes. ? Cold hands and feet. ? Confusion. ? Lack of energy (lethargy). ? Trouble waking up from sleep. ? Short-term weight loss. ? Unconsciousness. Follow these instructions at home:   If told by your doctor, drink an ORS: ? Make an ORS by using instructions on the package. ? Start by drinking small amounts, about  cup (120 mL) every 5-10 minutes. ? Slowly drink more until you have had the amount that your doctor said to have.  Drink enough clear fluid to keep your pee clear or pale yellow. If you were told to drink an ORS, finish the  ORS first, then start slowly drinking clear fluids. Drink fluids such as: ? Water. Do not drink only water by itself. Doing that can make the salt (sodium) level in your body get too low (hyponatremia). ? Ice chips. ? Fruit juice that you have added water to (diluted). ? Low-calorie sports drinks.  Avoid: ? Alcohol. ? Drinks that have a lot of sugar. These include high-calorie sports drinks, fruit juice that does not have water added, and soda. ? Caffeine. ? Foods that are greasy or have a lot of fat or sugar.  Take over-the-counter and prescription medicines only as told by your doctor.  Do not take salt tablets. Doing that can make the salt level in your body get too high (hypernatremia).  Eat foods that have minerals (electrolytes). Examples include bananas, oranges, potatoes, tomatoes, and spinach.  Keep all follow-up visits as told by your doctor. This is important. Contact a doctor if:  You have belly (abdominal) pain that: ? Gets worse. ? Stays in one area (localizes).  You have a rash.  You have a stiff neck.  You get angry or annoyed more easily than normal (irritability).  You are more sleepy than normal.  You have a harder time waking up than normal.  You feel: ? Weak. ? Dizzy. ? Very thirsty.  You have peed (urinated) only a small amount of very dark pee during 6-8 hours. Get help right away if:  You have  symptoms of very bad dehydration.  You cannot drink fluids without throwing up (vomiting).  Your symptoms get worse with treatment.  You have a fever.  You have a very bad headache.  You are throwing up or having watery poop (diarrhea) and it: ? Gets worse. ? Does not go away.  You have blood or something green (bile) in your throw-up.  You have blood in your poop (stool). This may cause poop to look black and tarry.  You have not peed in 6-8 hours.  You pass out (faint).  Your heart rate when you are sitting still is more than 100 beats a  minute.  You have trouble breathing. This information is not intended to replace advice given to you by your health care provider. Make sure you discuss any questions you have with your health care provider. Document Released: 03/01/2009 Document Revised: 04/17/2017 Document Reviewed: 06/29/2015 Elsevier Patient Education  2020 [place]

## 2018-12-23 NOTE — Progress Notes (Signed)
Patient here today for IVF and Udenyca. Interpreter at bedside.   Spoke with her about the last 2 days since Chemo. She has done well. She's had no side effects or complaints. She has had minimal nausea which has been controlled with her antiemetics. She feels like she has been sleeping better since the chemo and has taken the trazodone once. She felt this helped.  She has her nausea and other prn medications at home. She knows to call the office with any side effects, concerns or questions.   Covid screening test sent to the ED. Continued multiple issues with correct ordering and having it cross over to the appropriate system. We are continuing to work on the order and cross over process, however COVID test was resulted negative and result scanned into chart.  Tonya called to [place] to notify them that her negative result was scanned into her chart.

## 2018-12-23 NOTE — Patient Instructions (Signed)

## 2018-12-24 ENCOUNTER — Ambulatory Visit (HOSPITAL_COMMUNITY)
Admission: RE | Admit: 2018-12-24 | Discharge: 2018-12-24 | Disposition: A | Discharge status: Home / Self Care | Payer: Commercial Managed Care - PPO | Source: Ambulatory Visit | Attending: Hematology | Admitting: Hematology

## 2018-12-24 ENCOUNTER — Ambulatory Visit (HOSPITAL_COMMUNITY)
Admission: RE | Admit: 2018-12-24 | Discharge: 2018-12-24 | Disposition: A | Discharge status: Home / Self Care | Payer: Commercial Managed Care - PPO | Source: Ambulatory Visit | Attending: Physician Assistant | Admitting: Physician Assistant

## 2018-12-24 DIAGNOSIS — J910 Malignant pleural effusion: Secondary | ICD-10-CM | POA: Diagnosis not present

## 2018-12-24 DIAGNOSIS — J90 Pleural effusion, not elsewhere classified: Secondary | ICD-10-CM | POA: Diagnosis present

## 2018-12-24 DIAGNOSIS — I700 Atherosclerosis of aorta: Secondary | ICD-10-CM | POA: Diagnosis not present

## 2018-12-24 MED ORDER — LIDOCAINE HCL 1 % IJ SOLN
INTRAMUSCULAR | Status: AC
  Filled 2018-12-24: qty 10

## 2018-12-24 NOTE — Procedures (Signed)
PROCEDURE SUMMARY:  Successful image-guided left thoracentesis. Yielded 1.6 L liters of cloudy, yellow fluid. Patient tolerated procedure well. EBL: Zero No immediate complications.  Specimen was not sent for labs. Post procedure CXR shows no pneumothorax.  Please see imaging section of Epic for full dictation.  Joaquim Nam PA-C 12/24/2018 11:30 AM

## 2018-12-28 ENCOUNTER — Inpatient Hospital Stay (HOSPITAL_COMMUNITY): Payer: Commercial Managed Care - PPO

## 2018-12-28 ENCOUNTER — Encounter (HOSPITAL_COMMUNITY): Payer: Self-pay

## 2018-12-28 ENCOUNTER — Other Ambulatory Visit: Payer: Self-pay

## 2018-12-28 ENCOUNTER — Encounter: Payer: Self-pay | Admitting: *Deleted

## 2018-12-28 ENCOUNTER — Inpatient Hospital Stay (HOSPITAL_COMMUNITY)
Admission: AD | Admit: 2018-12-28 | Discharge: 2018-12-28 | DRG: 641 | Disposition: A | Discharge status: Home / Self Care | Payer: Commercial Managed Care - PPO | Source: Other Acute Inpatient Hospital | Attending: Internal Medicine | Admitting: Internal Medicine

## 2018-12-28 ENCOUNTER — Other Ambulatory Visit: Payer: Self-pay | Admitting: Hematology

## 2018-12-28 DIAGNOSIS — E785 Hyperlipidemia, unspecified: Secondary | ICD-10-CM | POA: Diagnosis present

## 2018-12-28 DIAGNOSIS — R0602 Shortness of breath: Secondary | ICD-10-CM

## 2018-12-28 DIAGNOSIS — D702 Other drug-induced agranulocytosis: Secondary | ICD-10-CM

## 2018-12-28 DIAGNOSIS — K5090 Crohn's disease, unspecified, without complications: Secondary | ICD-10-CM | POA: Diagnosis not present

## 2018-12-28 DIAGNOSIS — T502X5A Adverse effect of carbonic-anhydrase inhibitors, benzothiadiazides and other diuretics, initial encounter: Secondary | ICD-10-CM | POA: Diagnosis present

## 2018-12-28 DIAGNOSIS — Z20828 Contact with and (suspected) exposure to other viral communicable diseases: Secondary | ICD-10-CM | POA: Diagnosis present

## 2018-12-28 DIAGNOSIS — E860 Dehydration: Secondary | ICD-10-CM | POA: Diagnosis present

## 2018-12-28 DIAGNOSIS — C8330 Diffuse large B-cell lymphoma, unspecified site: Secondary | ICD-10-CM | POA: Diagnosis present

## 2018-12-28 DIAGNOSIS — E871 Hypo-osmolality and hyponatremia: Secondary | ICD-10-CM | POA: Diagnosis not present

## 2018-12-28 DIAGNOSIS — T451X5A Adverse effect of antineoplastic and immunosuppressive drugs, initial encounter: Secondary | ICD-10-CM | POA: Diagnosis present

## 2018-12-28 DIAGNOSIS — J910 Malignant pleural effusion: Secondary | ICD-10-CM | POA: Diagnosis present

## 2018-12-28 DIAGNOSIS — T451X5S Adverse effect of antineoplastic and immunosuppressive drugs, sequela: Secondary | ICD-10-CM | POA: Diagnosis not present

## 2018-12-28 DIAGNOSIS — D6481 Anemia due to antineoplastic chemotherapy: Secondary | ICD-10-CM | POA: Diagnosis present

## 2018-12-28 DIAGNOSIS — I10 Essential (primary) hypertension: Secondary | ICD-10-CM | POA: Diagnosis not present

## 2018-12-28 DIAGNOSIS — D701 Agranulocytosis secondary to cancer chemotherapy: Secondary | ICD-10-CM | POA: Diagnosis present

## 2018-12-28 LAB — BASIC METABOLIC PANEL
Anion gap: 11 (ref 5–15)
Anion gap: 10 (ref 5–15)
Anion gap: 12 (ref 5–15)
BUN: 8 mg/dL (ref 6–20)
BUN: 11 mg/dL (ref 6–20)
BUN: 11 mg/dL (ref 6–20)
CO2: 25 mmol/L (ref 22–32)
CO2: 25 mmol/L (ref 22–32)
CO2: 23 mmol/L (ref 22–32)
Calcium: 8.4 mg/dL — ABNORMAL LOW (ref 8.9–10.3)
Calcium: 8.3 mg/dL — ABNORMAL LOW (ref 8.9–10.3)
Calcium: 8.4 mg/dL — ABNORMAL LOW (ref 8.9–10.3)
Chloride: 98 mmol/L (ref 98–111)
Chloride: 96 mmol/L — ABNORMAL LOW (ref 98–111)
Chloride: 94 mmol/L — ABNORMAL LOW (ref 98–111)
Creatinine, Ser: 0.48 mg/dL (ref 0.44–1.00)
Creatinine, Ser: 0.58 mg/dL (ref 0.44–1.00)
Creatinine, Ser: 0.66 mg/dL (ref 0.44–1.00)
GFR calc Af Amer: >60 mL/min (ref >60)
GFR calc Af Amer: >60 mL/min (ref >60)
GFR calc Af Amer: >60 mL/min (ref >60)
GFR calc non Af Amer: >60 mL/min (ref >60)
GFR calc non Af Amer: >60 mL/min (ref >60)
GFR calc non Af Amer: >60 mL/min (ref >60)
Glucose, Bld: 100 mg/dL — ABNORMAL HIGH (ref 70–99)
Glucose, Bld: 134 mg/dL — ABNORMAL HIGH (ref 70–99)
Glucose, Bld: 124 mg/dL — ABNORMAL HIGH (ref 70–99)
Potassium: 3.2 mmol/L — ABNORMAL LOW (ref 3.5–5.1)
Potassium: 3.3 mmol/L — ABNORMAL LOW (ref 3.5–5.1)
Potassium: 3.6 mmol/L (ref 3.5–5.1)
Sodium: 134 mmol/L — ABNORMAL LOW (ref 135–145)
Sodium: 131 mmol/L — ABNORMAL LOW (ref 135–145)
Sodium: 129 mmol/L — ABNORMAL LOW (ref 135–145)

## 2018-12-28 LAB — CBC WITH DIFFERENTIAL/PLATELET
Abs Immature Granulocytes: 0.02 10*3/uL (ref 0.00–0.07)
Basophils Absolute: 0 10*3/uL (ref 0.0–0.1)
Basophils Relative: 0 %
Eosinophils Absolute: 0 10*3/uL (ref 0.0–0.5)
Eosinophils Relative: 8 %
HCT: 34.7 % — ABNORMAL LOW (ref 36.0–46.0)
Hemoglobin: 11.4 g/dL — ABNORMAL LOW (ref 12.0–15.0)
Immature Granulocytes: 5 %
Lymphocytes Relative: 51 %
Lymphs Abs: 0.2 10*3/uL — ABNORMAL LOW (ref 0.7–4.0)
MCH: 26.1 pg (ref 26.0–34.0)
MCHC: 32.9 g/dL (ref 30.0–36.0)
MCV: 79.6 fL — ABNORMAL LOW (ref 80.0–100.0)
Monocytes Absolute: 0.1 10*3/uL (ref 0.1–1.0)
Monocytes Relative: 14 %
Neutro Abs: 0.1 10*3/uL — ABNORMAL LOW (ref 1.7–7.7)
Neutrophils Relative %: 22 %
Platelets: 157 10*3/uL (ref 150–400)
RBC: 4.36 MIL/uL (ref 3.87–5.11)
RDW: 16.1 % — ABNORMAL HIGH (ref 11.5–15.5)
WBC: 0.4 10*3/uL — CL (ref 4.0–10.5)
nRBC: 0 % (ref 0.0–0.2)

## 2018-12-28 LAB — HEPATIC FUNCTION PANEL
ALT: 53 U/L — ABNORMAL HIGH (ref 0–44)
AST: 16 U/L (ref 15–41)
Albumin: 3.1 g/dL — ABNORMAL LOW (ref 3.5–5.0)
Alkaline Phosphatase: 83 U/L (ref 38–126)
Bilirubin, Direct: 0.1 mg/dL (ref 0.0–0.2)
Indirect Bilirubin: 0.5 mg/dL (ref 0.3–0.9)
Total Bilirubin: 0.6 mg/dL (ref 0.3–1.2)
Total Protein: 5.8 g/dL — ABNORMAL LOW (ref 6.5–8.1)

## 2018-12-28 LAB — SARS CORONAVIRUS 2 BY RT PCR (HOSPITAL ORDER, PERFORMED IN ~~LOC~~ HOSPITAL LAB): SARS Coronavirus 2: NEGATIVE

## 2018-12-28 MED ORDER — TRAZODONE HCL 50 MG PO TABS: 50.0000 mg | ORAL_TABLET | Freq: Every evening | ORAL | Status: DC | PRN

## 2018-12-28 MED ORDER — TRAMADOL HCL 50 MG PO TABS: 50.0000 mg | ORAL_TABLET | Freq: Three times a day (TID) | ORAL | Status: DC | PRN

## 2018-12-28 MED ORDER — AMLODIPINE BESYLATE 5 MG PO TABS
5.0000 mg | ORAL_TABLET | Freq: Every day | ORAL | Status: DC
  Administered 2018-12-28: 5 mg via ORAL
  Filled 2018-12-28: qty 1

## 2018-12-28 MED ORDER — POTASSIUM CHLORIDE CRYS ER 20 MEQ PO TBCR
40.0000 meq | EXTENDED_RELEASE_TABLET | Freq: Once | ORAL | Status: AC
  Administered 2018-12-28: 40 meq via ORAL
  Filled 2018-12-28: qty 2

## 2018-12-28 MED ORDER — HYDRALAZINE HCL 20 MG/ML IJ SOLN: 10.0000 mg | INTRAMUSCULAR | Status: DC | PRN

## 2018-12-28 MED ORDER — CALCIUM CARBONATE ANTACID 500 MG PO CHEW: 2.0000 | CHEWABLE_TABLET | Freq: Every day | ORAL | Status: DC | PRN

## 2018-12-28 MED ORDER — PRAVASTATIN SODIUM 20 MG PO TABS: 40.0000 mg | ORAL_TABLET | Freq: Every day | ORAL | Status: DC

## 2018-12-28 MED ORDER — MESALAMINE 1.2 G PO TBEC
2.4000 g | DELAYED_RELEASE_TABLET | Freq: Every day | ORAL | Status: DC
  Administered 2018-12-28: 2.4 g via ORAL
  Filled 2018-12-28: qty 2

## 2018-12-28 MED ORDER — ACETAMINOPHEN 325 MG PO TABS: 650.0000 mg | ORAL_TABLET | Freq: Four times a day (QID) | ORAL | Status: DC | PRN

## 2018-12-28 MED ORDER — IMMUNE ENHANCE PO TABS: 1.0000 | ORAL_TABLET | Freq: Every day | ORAL | Status: DC

## 2018-12-28 MED ORDER — POLYETHYLENE GLYCOL 3350 17 G PO PACK: 17.0000 g | PACK | Freq: Every day | ORAL | Status: DC | PRN

## 2018-12-28 MED ORDER — ACETAMINOPHEN 650 MG RE SUPP: 650.0000 mg | Freq: Four times a day (QID) | RECTAL | Status: DC | PRN

## 2018-12-28 MED ORDER — MELATONIN 3 MG PO TABS
3.0000 mg | ORAL_TABLET | Freq: Every evening | ORAL | Status: DC | PRN
  Filled 2018-12-28: qty 1

## 2018-12-28 MED ORDER — ONDANSETRON HCL 4 MG PO TABS: 4.0000 mg | ORAL_TABLET | Freq: Four times a day (QID) | ORAL | Status: DC | PRN

## 2018-12-28 MED ORDER — ONDANSETRON HCL 4 MG/2ML IJ SOLN: 4.0000 mg | Freq: Four times a day (QID) | INTRAMUSCULAR | Status: DC | PRN

## 2018-12-28 MED ORDER — HYDROCODONE-ACETAMINOPHEN 5-325 MG PO TABS: 1.0000 | ORAL_TABLET | Freq: Four times a day (QID) | ORAL | Status: DC | PRN

## 2018-12-28 MED ORDER — VITAMIN D 25 MCG (1000 UNIT) PO TABS
1000.0000 [IU] | ORAL_TABLET | Freq: Every day | ORAL | Status: DC
  Administered 2018-12-28: 1000 [IU] via ORAL
  Filled 2018-12-28: qty 1

## 2018-12-28 MED ORDER — ALLOPURINOL 300 MG PO TABS
300.0000 mg | ORAL_TABLET | Freq: Every day | ORAL | Status: DC
  Administered 2018-12-28: 300 mg via ORAL
  Filled 2018-12-28: qty 1

## 2018-12-28 MED ORDER — POTASSIUM CHLORIDE ER 10 MEQ PO TBCR
10.0000 meq | EXTENDED_RELEASE_TABLET | Freq: Every day | ORAL | Status: DC
  Filled 2018-12-28: qty 1

## 2018-12-28 NOTE — Progress Notes (Signed)
PHARMACIST - PHYSICIAN ORDER COMMUNICATION  CONCERNING: P&T Medication Policy on Herbal Medications  DESCRIPTION:  This patient's order for:  Immune enhance  has been noted.  This product(s) is classified as an "herbal" or natural product. Due to a lack of definitive safety studies or FDA approval, nonstandard manufacturing practices, plus the potential risk of unknown drug-drug interactions while on inpatient medications, the Pharmacy and Therapeutics Committee does not permit the use of "herbal" or natural products of this type within [facility].   ACTION TAKEN: The pharmacy department is unable to verify this order at this time. Please reevaluate patient's clinical condition at discharge and address if the herbal or natural product(s) should be resumed at that time.  Lenis Noon, PharmD 12/28/18 5:11 AM

## 2018-12-28 NOTE — Discharge Summary (Addendum)
Discharge Summary  Denise Walls EGB:[id number] DOB: 1960-10-18  PCP: [name], MD  Admit date: 12/28/2018 Discharge date: 12/28/2018  Time spent: 25 minutes  Recommendations for Outpatient Follow-up:  1. Medication change: Patient's hydrochlorothiazide has been discontinued 2. 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    Discharge Condition: Improved, being discharged to home  Diet recommendation: Low-sodium  Vitals:   12/28/18 0300 12/28/18 0800  BP: 115/90 (!) 144/80  Pulse: 92 79  Resp: 18 18  Temp: 98.5 F (36.9 C) 97.8 F (36.6 C)  SpO2: 99% 99%    History of present illness:  Denise Walls is a 58 y.o. female with history of stage IV lymphoma recently started on chemotherapy on December 23, 2018 following which the following day patient had left-sided thoracentesis for malignant pleural effusion has been feeling weak with nausea poor appetite for the last few days.  Had gone to [facility] however the patient's labs showed a sodium of 121 and leukopenia patient was afebrile.  Patient was started on normal saline infusion had received 1 L.  Patient requested transfer to [facility] health system since patient is known to the system.  Patient afebrile.  COVID-19 test was negative at [facility].     Please note that patient only speaks Spanish.  Video Patent attorney used.  Hospital Course:  Principal Problem:   Hyponatremia: Patient sodium suspect to be low secondary to dehydration from poor p.o. intake due to  chemotherapy as well as hydrochlorothiazide.  HCTZ held and patient continued on IV fluids.  Patient sodium rapidly corrected and by 5 AM on 8/11, sodium at 134.  Patient sodium corrected much more quickly than anticipated.,  Fortunately she looks to be improved and no symptoms from rapid correction.  She is feeling much better.  Able to ambulate to the bathroom without any dizziness or weakness.  Discussed with patient and she will stop her HCTZ.  She will follow-up with oncology for repeat sodium level checks later. 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  Patient denies any shortness of breath    Neutropenia due to and not concurrent with chemotherapy [facility]) patient noted to be neutropenic, but afebrile.  No signs of acute infection.  Advised her to stay safe and avoid contact with others unless they and she are wearing masks.  She understands.   Crohn disease ([place])   Essential hypertension: As above.  Blood pressure stable.  HCTZ held.   Procedures:  None  Consultations:  None  Discharge Exam: BP (!) 144/80 (BP Location: Right Arm)    Pulse 79    Temp 97.8 F (36.6 C)    Resp 18    Ht 5' (1.524 m)    Wt 59.3 kg    LMP  (LMP Unknown)    SpO2 99%    BMI 25.53 kg/m   General: Alert and oriented x3, no acute distress Cardiovascular: Regular rate and rhythm, S1-S2 Respiratory: Clear to auscultation bilaterally  Discharge Instructions You were cared for by a  hospitalist during your hospital stay. If you have any questions about your discharge medications or the care you received while you were in the hospital after you are discharged, you can call the unit and asked to speak with the hospitalist on call if the hospitalist that took care of you is not available. Once you are discharged, your primary care physician will handle any further medical issues. Please note that NO REFILLS for any discharge  medications will be authorized once you are discharged, as it is imperative that you return to your primary care physician (or establish a relationship with a primary care physician if you do not have one) for your aftercare needs so that they can reassess your need for medications and monitor your lab values.  Discharge Instructions    Diet - low sodium heart healthy   Complete by: As directed    Increase activity slowly   Complete by: As directed      Allergies as of 12/28/2018      Reactions   Nsaids Other (See Comments)   Abdominal pain, GI Bleeding      Medication List    STOP taking these medications   hydrochlorothiazide 25 MG tablet Commonly known as: HYDRODIURIL     TAKE these medications   allopurinol 300 MG tablet Commonly known as: ZYLOPRIM Take 1 tablet (300 mg total) by mouth daily.   amLODipine 5 MG tablet Commonly known as: NORVASC Take 5 mg by mouth daily.   calcium carbonate 500 MG chewable tablet Commonly known as: TUMS - dosed in mg elemental calcium Chew 2 tablets by mouth daily as needed for indigestion or heartburn.   cholecalciferol 25 MCG (1000 UT) tablet Commonly known as: VITAMIN D3 Take 1,000 Units by mouth daily.   HYDROcodone-acetaminophen 5-325 MG tablet Commonly known as: Norco Take 1-2 tablets by mouth every 6 (six) hours as needed for moderate pain or severe pain.   Immune Enhance Tabs Take 1 tablet by mouth daily.   lidocaine-prilocaine cream Commonly known as: EMLA Apply to affected area once   lovastatin 40 MG tablet Commonly known as: MEVACOR Take 40 mg by mouth daily.   Melatonin 3 MG Tabs Take 3 mg by mouth at bedtime as needed (sleep).   mesalamine 1.2 g EC tablet Commonly known as: LIALDA Take 2.4 g by mouth daily.   ondansetron 8 MG tablet Commonly known as: Zofran Take 1 tablet (8 mg total) by mouth 2 (two) times daily as needed for refractory nausea / vomiting. Start on day 3 after cyclophosphamide  chemotherapy.   OVER THE COUNTER MEDICATION Take 2 tablets by mouth daily as needed (acid reflux). Soothing digestive relief otc   polyethylene glycol 17 g packet Commonly known as: MIRALAX / GLYCOLAX Take 17 g by mouth daily as needed for mild constipation.   potassium chloride 8 MEQ tablet Commonly known as: KLOR-CON Take 8 mEq by mouth daily.   prochlorperazine 10 MG tablet Commonly known as: COMPAZINE Take 1 tablet (10 mg total) by mouth every 6 (six) hours as needed (Nausea or vomiting).   traMADol 50 MG tablet Commonly known as: ULTRAM Take 1 tablet (50 mg total) by mouth every 8 (eight) hours as needed. What changed: reasons to take this   traZODone 50 MG tablet Commonly known as: DESYREL Take 1 tablet (50 mg total) by mouth at bedtime as needed for sleep.      Allergies  Allergen Reactions   Nsaids Other (See Comments)    Abdominal  pain, GI Bleeding       The results of significant diagnostics from this hospitalization (including imaging, microbiology, ancillary and laboratory) are listed below for reference.    Significant Diagnostic Studies: Dg Chest 1 View  Result Date: 12/24/2018 CLINICAL DATA:  Status post thoracentesis EXAM: CHEST  1 VIEW COMPARISON:  December 21, 2018 FINDINGS: No pneumothorax evident. Left pleural effusion is much smaller currently following thoracentesis. Small residual pleural effusion remains with left base atelectasis. Lungs elsewhere are clear. Heart is slightly enlarged with pulmonary vascularity normal. There is aortic atherosclerosis. Port-A-Cath tip is in the superior vena cava. There is mild degenerative change in each shoulder. IMPRESSION: No evident pneumothorax. Small residual left pleural effusion with left base atelectasis. Lungs elsewhere clear. Heart mildly enlarged. Aortic Atherosclerosis (ICD10-I70.0). Electronically Signed   By: Lowella Grip III M.D.   On: 12/24/2018 11:44   Dg Chest 1 View  Result Date:  12/08/2018 CLINICAL DATA:  Status post left thoracentesis EXAM: CHEST  1 VIEW COMPARISON:  PET-CT from 11/10/2018 FINDINGS: Cardiac shadow is stable. Right chest wall port is noted. Left basilar atelectasis is noted with tiny residual effusion following thoracentesis. No pneumothorax is noted. No acute bony abnormality is seen. IMPRESSION: Minimal residual left pleural effusion with mild atelectasis. No pneumothorax is noted. Electronically Signed   By: Inez Catalina M.D.   On: 12/08/2018 15:59   Dg Chest 2 View  Result Date: 12/22/2018 CLINICAL DATA:  Shortness of breath EXAM: CHEST - 2 VIEW COMPARISON:  12/08/2018 FINDINGS: Right Port-A-Cath remains in place, unchanged. Large left pleural effusion, enlarging since prior study. Left lower lobe atelectasis. Small right effusion with right base atelectasis. Heart is normal size. No acute bony abnormality. IMPRESSION: Large left pleural effusion and small right pleural effusion. Bibasilar atelectasis. Electronically Signed   By: Rolm Baptise M.D.   On: 12/22/2018 00:31   Dg Chest Port 1 View  Result Date: 12/28/2018 CLINICAL DATA:  Shortness of breath EXAM: PORTABLE CHEST 1 VIEW COMPARISON:  Four days ago FINDINGS: Small left pleural effusion. There is presumed adjacent atelectasis. No edema or air bronchogram. Normal heart size and mediastinal contours. Right IJ catheter with tip at the upper SVC. IMPRESSION: 1. Stable from study 4 days ago. 2. Small left pleural effusion. Electronically Signed   By: Monte Fantasia M.D.   On: 12/28/2018 04:53   Ir Imaging Guided Port Insertion  Result Date: 11/29/2018 INDICATION: 58 year old with lymphadenopathy and concern for a lymphoproliferative process or lymphoma. Request for Port-A-Cath placement. EXAM: FLUOROSCOPIC AND ULTRASOUND GUIDED PLACEMENT OF A SUBCUTANEOUS PORT COMPARISON:  None. MEDICATIONS: Ancef 2 g; The antibiotic was administered within an appropriate time interval prior to skin puncture.  ANESTHESIA/SEDATION: Versed 2.0 mg IV; Fentanyl 100 mcg IV; Moderate Sedation Time:  40 minutes The patient was continuously monitored during the procedure by the interventional radiology nurse under my direct supervision. FLUOROSCOPY TIME:  54 seconds, 7 mGy COMPLICATIONS: None immediate. PROCEDURE: The procedure, risks, benefits, and alternatives were explained to the patient. Questions regarding the procedure were encouraged and answered. The patient understands and consents to the procedure. Patient was placed supine on the interventional table. Ultrasound confirmed a patent right internal jugular vein. Ultrasound image was saved for documentation. The right chest and neck were cleaned with a skin antiseptic and a sterile drape was placed. Maximal barrier sterile technique was utilized including caps, mask, sterile gowns, sterile gloves, sterile drape, hand hygiene and skin antiseptic. The right neck was anesthetized with 1% lidocaine. Small  incision was made in the right neck with a blade. Micropuncture set was placed in the right internal jugular vein with ultrasound guidance. The micropuncture wire was used for measurement purposes. The right chest was anesthetized with 1% lidocaine. #15 blade was used to make an incision and a subcutaneous port pocket was formed. [place] was assembled. Subcutaneous tunnel was formed with a stiff tunneling device. The port catheter was brought through the subcutaneous tunnel. The port was placed in the subcutaneous pocket and sutured in place. The micropuncture set was exchanged for a peel-away sheath. The catheter was placed through the peel-away sheath and the tip was positioned in the lower SVC. Catheter placement was confirmed with fluoroscopy. The port was accessed and flushed with heparinized saline. The port pocket was closed using two layers of absorbable sutures and Dermabond. The vein skin site was closed using a single layer of absorbable suture and  Dermabond. Sterile dressings were applied. Patient tolerated the procedure well without an immediate complication. Ultrasound and fluoroscopic images were taken and saved for this procedure. IMPRESSION: Placement of a subcutaneous port device. Catheter tip in the lower SVC. Persistent moderate sized left pleural effusion. Electronically Signed   By: Markus Daft M.D.   On: 11/29/2018 18:14   US Thoracentesis Asp Pleural Space W/img Guide  Result Date: 12/24/2018 INDICATION: Patient with history of lymphoma, recurrent pleural effusion. Request for therapeutic thoracentesis today in IR. EXAM: ULTRASOUND GUIDED LEFT THORACENTESIS MEDICATIONS: 10 mL 1% lidocaine. COMPLICATIONS: None immediate. PROCEDURE: An ultrasound guided thoracentesis was thoroughly discussed with the patient and questions answered. The benefits, risks, alternatives and complications were also discussed. The patient understands and wishes to proceed with the procedure. Written consent was obtained. Ultrasound was performed to localize and mark an adequate pocket of fluid in the left chest. The area was then prepped and draped in the normal sterile fashion. 1% Lidocaine was used for local anesthesia. Under ultrasound guidance a 6 Fr Safe-T-Centesis catheter was introduced. Thoracentesis was performed. The catheter was removed and a dressing applied. FINDINGS: A total of approximately 1.6 L of cloudy yellow fluid was removed. IMPRESSION: Successful ultrasound guided left thoracentesis yielding 1.6 L of pleural fluid. Read by Candiss Norse, PA-C Electronically Signed   By: Corrie Mckusick D.O.   On: 12/24/2018 11:47   US Thoracentesis Asp Pleural Space W/img Guide  Result Date: 12/08/2018 INDICATION: Patient with history of lymphadenopathy concerning for lymphoma, dyspnea, left pleural effusion. Request made for diagnostic and therapeutic left thoracentesis. EXAM: ULTRASOUND GUIDED DIAGNOSTIC AND THERAPEUTIC LEFT THORACENTESIS MEDICATIONS: None  COMPLICATIONS: None immediate. PROCEDURE: An ultrasound guided thoracentesis was thoroughly discussed with the patient and questions answered. The benefits, risks, alternatives and complications were also discussed. The patient understands and wishes to proceed with the procedure. Written consent was obtained. Ultrasound was performed to localize and mark an adequate pocket of fluid in the left chest. The area was then prepped and draped in the normal sterile fashion. 1% Lidocaine was used for local anesthesia. Under ultrasound guidance a 6 Fr Safe-T-Centesis catheter was introduced. Thoracentesis was performed. The catheter was removed and a dressing applied. FINDINGS: A total of approximately 1.2 liters of turbid, yellow fluid was removed. Samples were sent to the laboratory as requested by the clinical team. Ordering MD was notified of above findings. IMPRESSION: Successful ultrasound guided diagnostic and therapeutic left thoracentesis yielding 1.2 liters of pleural fluid. Read by: Rowe Robert, PA-C Electronically Signed   By: Dellis Filbert.D.  On: 12/08/2018 16:28    Microbiology: Recent Results (from the past 240 hour(s))  SARS Coronavirus 2 [facility] order, Performed in [place] hospital lab)     Status: None   Collection Time: 12/28/18  4:49 AM  Result Value Ref Range Status   SARS Coronavirus 2 NEGATIVE NEGATIVE Final    Comment: (NOTE) If result is NEGATIVE SARS-CoV-2 target nucleic acids are NOT DETECTED. The SARS-CoV-2 RNA is generally detectable in upper and lower  respiratory specimens during the acute phase of infection. The lowest  concentration of SARS-CoV-2 viral copies this assay can detect is 250  copies / mL. A negative result does not preclude SARS-CoV-2 infection  and should not be used as the sole basis for treatment or other  patient management decisions.  A negative result may occur with  improper specimen collection / handling, submission of specimen other   than nasopharyngeal swab, presence of viral mutation(s) within the  areas targeted by this assay, and inadequate number of viral copies  (<250 copies / mL). A negative result must be combined with clinical  observations, patient history, and epidemiological information. If result is POSITIVE SARS-CoV-2 target nucleic acids are DETECTED. The SARS-CoV-2 RNA is generally detectable in upper and lower  respiratory specimens dur ing the acute phase of infection.  Positive  results are indicative of active infection with SARS-CoV-2.  Clinical  correlation with patient history and other diagnostic information is  necessary to determine patient infection status.  Positive results do  not rule out bacterial infection or co-infection with other viruses. If result is PRESUMPTIVE POSTIVE SARS-CoV-2 nucleic acids MAY BE PRESENT.   A presumptive positive result was obtained on the submitted specimen  and confirmed on repeat testing.  While 2019 novel coronavirus  (SARS-CoV-2) nucleic acids may be present in the submitted sample  additional confirmatory testing may be necessary for epidemiological  and / or clinical management purposes  to differentiate between  SARS-CoV-2 and other Sarbecovirus currently known to infect humans.  If clinically indicated additional testing with an alternate test  methodology [phone]) is advised. The SARS-CoV-2 RNA is generally  detectable in upper and lower respiratory sp ecimens during the acute  phase of infection. The expected result is Negative. Fact Sheet for Patients:  StrictlyIdeas.no Fact Sheet for Healthcare Providers: BankingDealers.co.za This test is not yet approved or cleared by the [place] FDA and has been authorized for detection and/or diagnosis of SARS-CoV-2 by FDA under an Emergency Use Authorization (EUA).  This EUA will remain in effect (meaning this test can be used) for the duration of  the COVID-19 declaration under Section 564(b)(1) of the Act, 21 U.S.C. section 360bbb-3(b)(1), unless the authorization is terminated or revoked sooner. Performed at [facility], [place] [address], [place] [employer], [place] [address]      Labs: Basic Metabolic Panel: Recent Labs  Lab 12/28/18 0516 12/28/18 0856  NA 134* 131*  K 3.2* 3.3*  CL 98 96*  CO2 25 25  GLUCOSE 100* 134*  BUN 8 11  CREATININE 0.48 0.58  CALCIUM 8.4* 8.3*   Liver Function Tests: Recent Labs  Lab 12/28/18 0516  AST 16  ALT 53*  ALKPHOS 83  BILITOT 0.6  PROT 5.8*  ALBUMIN 3.1*   No results for input(s): LIPASE, AMYLASE in the last 168 hours. No results for input(s): AMMONIA in the last 168 hours. CBC: Recent Labs  Lab 12/28/18 0516  WBC 0.4*  NEUTROABS 0.1*  HGB 11.4*  HCT 34.7*  MCV 79.6*  PLT 157   Cardiac Enzymes: No results for input(s): CKTOTAL, CKMB, CKMBINDEX, TROPONINI in the last 168 hours. BNP: BNP (last 3 results) No results for input(s): BNP in the last 8760 hours.  ProBNP (last 3 results) No results for input(s): PROBNP in the last 8760 hours.  CBG: No results for input(s): GLUCAP in the last 168 hours.     Signed:  Annita Brod, MD [facility] 12/28/2018, 12:37 PM

## 2018-12-28 NOTE — H&P (Addendum)
History and Physical    Denise Walls IWL:[id number] DOB: 1961-02-12 DOA: 12/28/2018  PCP: [name], MD  Patient coming from: Patient was transferred from [facility].  Chief Complaint: Weakness.  Patent attorney used.  HPI: Denise Walls is a 58 y.o. female with history of stage IV lymphoma recently started on chemotherapy on December 23, 2018 following which the following day patient had left-sided thoracentesis for malignant pleural effusion has been feeling weak with nausea poor appetite for the last few days.  Had gone to [facility] however the patient's labs show sodium of 121 and leukopenia patient was afebrile.  Patient was started on normal saline infusion had received 1 L.  Patient requested transfer to [facility] health system since patient is known to the system.  On arrival at [facility] long hospital patient appears to be not in distress.  Abdomen appears benign.  Patient appears nonfocal.  Patient afebrile.  COVID-19 test was negative at [facility].  Reviewed patient's labs and chart from [facility].   ED Course: Patient was a direct admit.  Review of Systems: As per HPI, rest all negative.   Past Medical History:  Diagnosis Date  . Acute cystitis   . Anxiety   . Arthritis   . Crohn disease ([place])    dx 2004, history of small bowel obstruction 02/2007 treated conservatively  . History of colon polyps   . History of shingles   . HTN (hypertension)   . Hypercholesterolemia   . Insomnia   . Migraine   . Uterine fibroid     Past Surgical History:  Procedure Laterality Date  . COLONOSCOPY  09/11/2014   Small internal hemorrhoids. Otherwise normal colonoscopy to terminal ileum  . COLONOSCOPY  10/02/2017  . ESOPHAGOGASTRODUODENOSCOPY  04/05/2007   Mild gastritis. Otherwise, normal esophagogastroduodenoscopy  . IR IMAGING GUIDED PORT INSERTION  11/29/2018  . LYMPH NODE BIOPSY Left 12/09/2018   Procedure: LEFT DEEP CERVICAL LYMPH  NODE BIOPSY;  Surgeon: Fanny Skates, MD;  Location: [place];  Service: General;  Laterality: Left;  . TUBAL LIGATION       reports that she has never smoked. She has never used smokeless tobacco. She reports that she does not drink alcohol or use drugs.  Allergies  Allergen Reactions  . Nsaids Other (See Comments)    Abdominal pain, GI Bleeding     Family History  Problem Relation Age of Onset  . Colon cancer Neg Hx     Prior to Admission medications   Medication Sig Start Date End Date Taking? Authorizing Provider  allopurinol (ZYLOPRIM) 300 MG tablet Take 1 tablet (300 mg total) by mouth daily. 12/14/18  Yes Tish Men, MD  amLODipine (NORVASC) 5 MG tablet Take 5 mg by mouth daily.  06/25/18  Yes [provider]  calcium carbonate (TUMS - DOSED IN MG ELEMENTAL CALCIUM) 500 MG chewable tablet Chew 2 tablets by mouth daily as needed for indigestion or heartburn.   Yes [provider]  cholecalciferol (VITAMIN D3) 25 MCG (1000 UT) tablet Take 1,000 Units by mouth daily.   Yes [provider]  hydrochlorothiazide (HYDRODIURIL) 25 MG tablet Take 25 mg by mouth daily.   Yes [provider]  HYDROcodone-acetaminophen (NORCO) 5-325 MG tablet Take 1-2 tablets by mouth every 6 (six) hours as needed for moderate pain or severe pain. 12/09/18  Yes Fanny Skates, MD  lidocaine-prilocaine (EMLA) cream Apply to affected area once 12/14/18  Yes Tish Men, MD  lovastatin (MEVACOR)  40 MG tablet Take 40 mg by mouth daily.  06/25/18  Yes [provider]  Melatonin 3 MG TABS Take 3 mg by mouth at bedtime as needed (sleep).   Yes [provider]  mesalamine (LIALDA) 1.2 g EC tablet Take 2.4 g by mouth daily.  05/15/17  Yes [provider]  Nutritional Supplements (IMMUNE ENHANCE) TABS Take 1 tablet by mouth daily.   Yes [provider]  ondansetron (ZOFRAN) 8 MG tablet Take 1 tablet (8 mg total) by mouth 2 (two) times daily as needed for  refractory nausea / vomiting. Start on day 3 after cyclophosphamide chemotherapy. 12/14/18  Yes Tish Men, MD  OVER THE COUNTER MEDICATION Take 2 tablets by mouth daily as needed (acid reflux). Soothing digestive relief otc   Yes [provider]  polyethylene glycol (MIRALAX / GLYCOLAX) 17 g packet Take 17 g by mouth daily as needed for mild constipation.   Yes [provider]  potassium chloride (KLOR-CON) 8 MEQ tablet Take 8 mEq by mouth daily.  06/27/18  Yes [provider]  prochlorperazine (COMPAZINE) 10 MG tablet Take 1 tablet (10 mg total) by mouth every 6 (six) hours as needed (Nausea or vomiting). 12/14/18  Yes Tish Men, MD  traMADol (ULTRAM) 50 MG tablet Take 1 tablet (50 mg total) by mouth every 8 (eight) hours as needed. Patient taking differently: Take 50 mg by mouth every 8 (eight) hours as needed for moderate pain.  11/16/18  Yes Tish Men, MD  traZODone (DESYREL) 50 MG tablet Take 1 tablet (50 mg total) by mouth at bedtime as needed for sleep. 12/21/18 01/20/19 Yes Tish Men, MD    Physical Exam: Constitutional: Moderately built and nourished. Vitals:   12/28/18 0300  BP: 115/90  Pulse: 92  Resp: 18  Temp: 98.5 F (36.9 C)  TempSrc: Oral  SpO2: 99%  Weight: 59.3 kg  Height: 5' (1.524 m)   Eyes: Anicteric no pallor. ENMT: No discharge from the ears eyes nose or mouth. Neck: No mass or.  No neck rigidity. Respiratory: No rhonchi or crepitations. Cardiovascular: S1-S2 heard. Abdomen: Soft nontender bowel sounds present. Musculoskeletal: No edema. Skin: No rash. Neurologic: Alert awake oriented to time place and person.  Moves all extremities. Psychiatric: Appears normal.   Labs on Admission: I have personally reviewed following labs and imaging studies  CBC: Recent Labs  Lab 12/21/18 0900  WBC 7.3  NEUTROABS 6.4  HGB 10.7*  HCT 33.6*  MCV 81.4  PLT 211   Basic Metabolic Panel: Recent Labs  Lab 12/21/18 0900  NA 133*  K 3.9  CL 96*   CO2 26  GLUCOSE 116*  BUN 11  CREATININE 1.21*  CALCIUM 8.3*   GFR: Estimated Creatinine Clearance: 40.8 mL/min (A) (by C-G formula based on SCr of 1.21 mg/dL (H)). Liver Function Tests: Recent Labs  Lab 12/21/18 0900  AST 20  ALT 19  ALKPHOS 70  BILITOT 0.3  PROT 6.1*  ALBUMIN 3.4*   No results for input(s): LIPASE, AMYLASE in the last 168 hours. No results for input(s): AMMONIA in the last 168 hours. Coagulation Profile: No results for input(s): INR, PROTIME in the last 168 hours. Cardiac Enzymes: No results for input(s): CKTOTAL, CKMB, CKMBINDEX, TROPONINI in the last 168 hours. BNP (last 3 results) No results for input(s): PROBNP in the last 8760 hours. HbA1C: No results for input(s): HGBA1C in the last 72 hours. CBG: No results for input(s): GLUCAP in the last 168 hours. Lipid Profile:  No results for input(s): CHOL, HDL, LDLCALC, TRIG, CHOLHDL, LDLDIRECT in the last 72 hours. Thyroid Function Tests: No results for input(s): TSH, T4TOTAL, FREET4, T3FREE, THYROIDAB in the last 72 hours. Anemia Panel: No results for input(s): VITAMINB12, FOLATE, FERRITIN, TIBC, IRON, RETICCTPCT in the last 72 hours. Urine analysis: No results found for: COLORURINE, APPEARANCEUR, LABSPEC, PHURINE, GLUCOSEU, HGBUR, BILIRUBINUR, KETONESUR, PROTEINUR, UROBILINOGEN, NITRITE, LEUKOCYTESUR Sepsis Labs: @LABRCNTIP (procalcitonin:4,lacticidven:4) )No results found for this or any previous visit (from the past 240 hour(s)).   Radiological Exams on Admission: Dg Chest Port 1 View  Result Date: 12/28/2018 CLINICAL DATA:  Shortness of breath EXAM: PORTABLE CHEST 1 VIEW COMPARISON:  Four days ago FINDINGS: Small left pleural effusion. There is presumed adjacent atelectasis. No edema or air bronchogram. Normal heart size and mediastinal contours. Right IJ catheter with tip at the upper SVC. IMPRESSION: 1. Stable from study 4 days ago. 2. Small left pleural effusion. Electronically Signed   By:  Monte Fantasia M.D.   On: 12/28/2018 04:53     Assessment/Plan Principal Problem:   Hyponatremia Active Problems:   DLBCL (diffuse large B cell lymphoma) (HCC)   Malignant pleural effusion   Leucopenia   Crohn disease ([place])   Essential hypertension    1. Severe hyponatremia could be from poor oral intake and patient also taking hydrochlorothiazide.  Patient advised not to take hydrochlorothiazide.  Patient did receive 1 L fluid in the ER at [facility].  Will repeat metabolic panel before addressing any further fluids.  Check urine sodium urine osmolality. 2. Generalized weakness likely from dehydration chemotherapy. 3. Left pleural effusion recently had thoracentesis done.  Repeat chest x-ray shows only small left pleural effusion. 4. Stage IV lymphoma being followed by oncologist Dr. Maylon Peppers.  Had received chemotherapy last week. 5. Leukopenia patient is afebrile closely follow.  I did not start any antibiotics. 6. Hypertension we will continue amlodipine.  Holding of hydrochlorothiazide due to hyponatremia.  PRN IV hydralazine. 7. Hyperlipidemia on statins. 8. History of Crohn's disease on Lialda.   DVT prophylaxis: Lovenox. Code Status: Full code. Family Communication: Discussed with patient's daughter Ms. Ms. Macario Carls. Disposition Plan: Home. Consults called: None. Admission status: Observation.   Rise Patience MD [facility] Pager [phone].  If 7PM-7AM, please contact night-coverage [email or web address] Password [facility]  12/28/2018, 4:59 AM

## 2018-12-28 NOTE — Progress Notes (Signed)
CRITICAL VALUE ALERT  Critical Value:  WBC 0.4  Date & Time Notied:  12/28/18 @0600    Provider Notified: text paged on call provider   Orders Received/Actions taken: awaiting new orders.

## 2018-12-28 NOTE — Progress Notes (Signed)
HEMATOLOGY-ONCOLOGY PROGRESS NOTE  SUBJECTIVE: The patient was admitted secondary to weakness and hyponatremia.  Outside records from [facility] show sodium level of 121. History was obtained through her daughter, Georgiana Spinner.  The patient states that she had dryness in her mouth and was unable to really eat very well.  Denies mucositis.  She denies having any fevers or chills.  Reports that her shortness of breath improved following the thoracentesis on 12/24/2018.  She still has some mild dyspnea with exertion.  Denies cough.  Vomited x1 yesterday prior to admission.  She currently denies chest discomfort, nausea, vomiting, constipation, diarrhea. Feels better since receiving IV fluids.   Oncology History  DLBCL (diffuse large B cell lymphoma) ([place])  10/29/2018 Imaging   CT abdomen/pelvis: IMPRESSION: Abdominopelvic lymphadenopathy, including a dominant 3.2 cm short axis jejunal mesentery nodal mass encasing the SMA, partially necrotic. Overall appearance favors lymphoma, although nodal metastases are also possible.   Spleen is normal in size. However, hypoenhancing lesions are suspected on the portal venous phase, raising the possibility of lymphomatous involvement.   Suspected subcarinal nodal mass, incompletely evaluated. Consider CT chest or PET-CT for further evaluation.   Moderate left pleural effusion. Associated left lower lobe opacity, likely atelectasis.   11/10/2018 Imaging   PET: IMPRESSION: 1. 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 2. Nodal metastasis include the lower neck, mediastinum, mesentery, peritoneum and retroperitoneum, and iliac lymph nodes. 3. Spleen and bone marrow normal. 4. Moderate LEFT pleural effusion. 5. Target lymph nodes for sampling could include the LEFT external iliac lymph node, precordial lymph node in the LEFT upper quadrant, or super clavicular/LEFT sub pectoralis nodes.   12/03/2018 Bone Marrow  Biopsy   Bone Marrow, Aspirate,Biopsy, and Clot, left posterior iliac crest - NORMOCELLULAR MARROW WITH FOCAL SMALL LYMPHOID AGGREGATES. - SEE COMMENT. PERIPHERAL BLOOD: - BORDERLINE NORMOCYTIC ANEMIA. Diagnosis Note The marrow has only a few small lymphoid aggregates which are not overtly atypical and contain a mixture of B-cells and T-cells. Flow cytometry is negative for a monoclonal B-cell population. These findings are not diagnostic of a lymphoproliferative process.  Accession: [id number] Bone Marrow Flow Cytometry - NO MONOCLONAL B-CELL OR PHENOTYPICALLY ABERRANT T-CELL POPULATION IDENTIFIED.   12/03/2018 Imaging   TTE:  1. The left ventricle has normal systolic function with an ejection fraction of 60-65%. The cavity size was normal. Left ventricular diastolic Doppler parameters are consistent with impaired relaxation.  2. The right ventricle has normal systolic function. The cavity was normal. There is no increase in right ventricular wall thickness.  3. Large pleural effusion.  4. Clinical correlation suggested.  5. The mitral valve is grossly normal.  6. The tricuspid valve is grossly normal.  7. The aortic valve is grossly normal. Aortic valve regurgitation was not assessed by color flow Doppler.  8. The aortic root is normal in size and structure.   12/08/2018 Procedure   Left thoracentesis   12/08/2018 Pathology Results   Diagnosis PLEURAL FLUID, LEFT (SPECIMEN 1 OF 1 COLLECTED 12/08/18): - B-CELL LYMPHOMA - SEE COMMENT   12/09/2018 Procedure   Left supraclavicular LN bx    12/09/2018 Pathology Results   Accession: [id number] Lymph node for lymphoma, left deep cervical - DIFFUSE LARGE B-CELL LYMPHOMA - SEE COMMENT   12/21/2018 -  Chemotherapy   The patient had DOXOrubicin (ADRIAMYCIN) chemo injection 80 mg, 50 mg/m2 = 80 mg, Intravenous,  Once, 1 of 6 cycles Administration: 80 mg (12/21/2018) palonosetron (ALOXI) injection 0.25 mg, 0.25 mg, Intravenous,  Once, 1 of  6  cycles Administration: 0.25 mg (12/21/2018) pegfilgrastim-cbqv (UDENYCA) injection 6 mg, 6 mg, Subcutaneous, Once, 1 of 6 cycles Administration: 6 mg (12/23/2018) vinCRIStine (ONCOVIN) 2 mg in sodium chloride 0.9 % 50 mL chemo infusion, 2 mg, Intravenous,  Once, 1 of 6 cycles Administration: 2 mg (12/21/2018) riTUXimab (RITUXAN) 600 mg in sodium chloride 0.9 % 250 mL (1.9355 mg/mL) infusion, 375 mg/m2 = 600 mg, Intravenous,  Once, 1 of 1 cycle Administration: 600 mg (12/21/2018) cyclophosphamide (CYTOXAN) 1,200 mg in sodium chloride 0.9 % 250 mL chemo infusion, 750 mg/m2 = 1,200 mg, Intravenous,  Once, 1 of 6 cycles Administration: 1,200 mg (12/21/2018) riTUXimab (RITUXAN) 600 mg in sodium chloride 0.9 % 190 mL infusion, 375 mg/m2 = 600 mg (100 % of original dose 375 mg/m2), Intravenous,  Once, 0 of 5 cycles Dose modification: 375 mg/m2 (original dose 375 mg/m2, Cycle 2, Reason: Provider Judgment, Comment: VO to change to RIR) fosaprepitant (EMEND) 150 mg, dexamethasone (DECADRON) 12 mg in sodium chloride 0.9 % 145 mL IVPB, , Intravenous,  Once, 1 of 6 cycles Administration:  (12/21/2018)  for chemotherapy treatment.       REVIEW OF SYSTEMS:   Constitutional: Denies fevers, chills Eyes: Denies blurriness of vision Ears, nose, mouth, throat, and face: Reports dry mouth which is better.  Denies mucositis. Respiratory: Denies cough.  Reports mild dyspnea on exertion. Cardiovascular: Denies palpitation, chest discomfort Gastrointestinal:  Denies nausea, heartburn or change in bowel habits Skin: Denies abnormal skin rashes Lymphatics: Denies new lymphadenopathy or easy bruising Neurological:Denies numbness, tingling or new weaknesses Behavioral/Psych: Mood is stable, no new changes  Extremities: No lower extremity edema All other systems were reviewed with the patient and are negative.  I have reviewed the past medical history, past surgical history, social history and family history with the patient  and they are unchanged from previous note.   PHYSICAL EXAMINATION: ECOG PERFORMANCE STATUS: 1 - Symptomatic but completely ambulatory  Vitals:   12/28/18 0300 12/28/18 0800  BP: 115/90 (!) 144/80  Pulse: 92 79  Resp: 18 18  Temp: 98.5 F (36.9 C) 97.8 F (36.6 C)  SpO2: 99% 99%   Filed Weights   12/28/18 0300  Weight: 130 lb 11.7 oz (59.3 kg)    Intake/Output from previous day: No intake/output data recorded.  GENERAL:alert, no distress and comfortable SKIN: skin color, texture, turgor are normal, no rashes or significant lesions EYES: normal, Conjunctiva are pink and non-injected, sclera clear OROPHARYNX:no exudate, no erythema and lips, buccal mucosa, and tongue normal  NECK: supple, thyroid normal size, non-tender, without nodularity LYMPH:  no palpable lymphadenopathy in the cervical, axillary or inguinal LUNGS: clear to auscultation and percussion with normal breathing effort HEART: regular rate & rhythm and no murmurs and no lower extremity edema ABDOMEN:abdomen soft, non-tender and normal bowel sounds Musculoskeletal:no cyanosis of digits and no clubbing  NEURO: alert & oriented x 3 with fluent speech, no focal motor/sensory deficits  LABORATORY DATA:  I have reviewed the data as listed CMP Latest Ref Rng & Units 12/28/2018 12/28/2018 12/21/2018  Glucose 70 - 99 mg/dL 134(H) 100(H) 116(H)  BUN 6 - 20 mg/dL 11 8 11   Creatinine 0.44 - 1.00 mg/dL 0.58 0.48 1.21(H)  Sodium 135 - 145 mmol/L 131(L) 134(L) 133(L)  Potassium 3.5 - 5.1 mmol/L 3.3(L) 3.2(L) 3.9  Chloride 98 - 111 mmol/L 96(L) 98 96(L)  CO2 22 - 32 mmol/L 25 25 26   Calcium 8.9 - 10.3 mg/dL 8.3(L) 8.4(L) 8.3(L)  Total Protein 6.5 - 8.1  g/dL - 5.8(L) 6.1(L)  Total Bilirubin 0.3 - 1.2 mg/dL - 0.6 0.3  Alkaline Phos 38 - 126 U/L - 83 70  AST 15 - 41 U/L - 16 20  ALT 0 - 44 U/L - 53(H) 19    Lab Results  Component Value Date   WBC 0.4 (LL) 12/28/2018   HGB 11.4 (L) 12/28/2018   HCT 34.7 (L) 12/28/2018    MCV 79.6 (L) 12/28/2018   PLT 157 12/28/2018   NEUTROABS 0.1 (L) 12/28/2018    Dg Chest 1 View  Result Date: 12/24/2018 CLINICAL DATA:  Status post thoracentesis EXAM: CHEST  1 VIEW COMPARISON:  December 21, 2018 FINDINGS: No pneumothorax evident. Left pleural effusion is much smaller currently following thoracentesis. Small residual pleural effusion remains with left base atelectasis. Lungs elsewhere are clear. Heart is slightly enlarged with pulmonary vascularity normal. There is aortic atherosclerosis. Port-A-Cath tip is in the superior vena cava. There is mild degenerative change in each shoulder. IMPRESSION: No evident pneumothorax. Small residual left pleural effusion with left base atelectasis. Lungs elsewhere clear. Heart mildly enlarged. Aortic Atherosclerosis (ICD10-I70.0). Electronically Signed   By: Lowella Grip III M.D.   On: 12/24/2018 11:44   Dg Chest 1 View  Result Date: 12/08/2018 CLINICAL DATA:  Status post left thoracentesis EXAM: CHEST  1 VIEW COMPARISON:  PET-CT from 11/10/2018 FINDINGS: Cardiac shadow is stable. Right chest wall port is noted. Left basilar atelectasis is noted with tiny residual effusion following thoracentesis. No pneumothorax is noted. No acute bony abnormality is seen. IMPRESSION: Minimal residual left pleural effusion with mild atelectasis. No pneumothorax is noted. Electronically Signed   By: Inez Catalina M.D.   On: 12/08/2018 15:59   Dg Chest 2 View  Result Date: 12/22/2018 CLINICAL DATA:  Shortness of breath EXAM: CHEST - 2 VIEW COMPARISON:  12/08/2018 FINDINGS: Right Port-A-Cath remains in place, unchanged. Large left pleural effusion, enlarging since prior study. Left lower lobe atelectasis. Small right effusion with right base atelectasis. Heart is normal size. No acute bony abnormality. IMPRESSION: Large left pleural effusion and small right pleural effusion. Bibasilar atelectasis. Electronically Signed   By: Rolm Baptise M.D.   On: 12/22/2018 00:31    Dg Chest Port 1 View  Result Date: 12/28/2018 CLINICAL DATA:  Shortness of breath EXAM: PORTABLE CHEST 1 VIEW COMPARISON:  Four days ago FINDINGS: Small left pleural effusion. There is presumed adjacent atelectasis. No edema or air bronchogram. Normal heart size and mediastinal contours. Right IJ catheter with tip at the upper SVC. IMPRESSION: 1. Stable from study 4 days ago. 2. Small left pleural effusion. Electronically Signed   By: Monte Fantasia M.D.   On: 12/28/2018 04:53   Ir Imaging Guided Port Insertion  Result Date: 11/29/2018 INDICATION: 58 year old with lymphadenopathy and concern for a lymphoproliferative process or lymphoma. Request for Port-A-Cath placement. EXAM: FLUOROSCOPIC AND ULTRASOUND GUIDED PLACEMENT OF A SUBCUTANEOUS PORT COMPARISON:  None. MEDICATIONS: Ancef 2 g; The antibiotic was administered within an appropriate time interval prior to skin puncture. ANESTHESIA/SEDATION: Versed 2.0 mg IV; Fentanyl 100 mcg IV; Moderate Sedation Time:  40 minutes The patient was continuously monitored during the procedure by the interventional radiology nurse under my direct supervision. FLUOROSCOPY TIME:  54 seconds, 7 mGy COMPLICATIONS: None immediate. PROCEDURE: The procedure, risks, benefits, and alternatives were explained to the patient. Questions regarding the procedure were encouraged and answered. The patient understands and consents to the procedure. Patient was placed supine on the interventional table. Ultrasound confirmed a patent right  internal jugular vein. Ultrasound image was saved for documentation. The right chest and neck were cleaned with a skin antiseptic and a sterile drape was placed. Maximal barrier sterile technique was utilized including caps, mask, sterile gowns, sterile gloves, sterile drape, hand hygiene and skin antiseptic. The right neck was anesthetized with 1% lidocaine. Small incision was made in the right neck with a blade. Micropuncture set was placed in the  right internal jugular vein with ultrasound guidance. The micropuncture wire was used for measurement purposes. The right chest was anesthetized with 1% lidocaine. #15 blade was used to make an incision and a subcutaneous port pocket was formed. [place] was assembled. Subcutaneous tunnel was formed with a stiff tunneling device. The port catheter was brought through the subcutaneous tunnel. The port was placed in the subcutaneous pocket and sutured in place. The micropuncture set was exchanged for a peel-away sheath. The catheter was placed through the peel-away sheath and the tip was positioned in the lower SVC. Catheter placement was confirmed with fluoroscopy. The port was accessed and flushed with heparinized saline. The port pocket was closed using two layers of absorbable sutures and Dermabond. The vein skin site was closed using a single layer of absorbable suture and Dermabond. Sterile dressings were applied. Patient tolerated the procedure well without an immediate complication. Ultrasound and fluoroscopic images were taken and saved for this procedure. IMPRESSION: Placement of a subcutaneous port device. Catheter tip in the lower SVC. Persistent moderate sized left pleural effusion. Electronically Signed   By: Markus Daft M.D.   On: 11/29/2018 18:14   US Thoracentesis Asp Pleural Space W/img Guide  Result Date: 12/24/2018 INDICATION: Patient with history of lymphoma, recurrent pleural effusion. Request for therapeutic thoracentesis today in IR. EXAM: ULTRASOUND GUIDED LEFT THORACENTESIS MEDICATIONS: 10 mL 1% lidocaine. COMPLICATIONS: None immediate. PROCEDURE: An ultrasound guided thoracentesis was thoroughly discussed with the patient and questions answered. The benefits, risks, alternatives and complications were also discussed. The patient understands and wishes to proceed with the procedure. Written consent was obtained. Ultrasound was performed to localize and mark an adequate pocket of  fluid in the left chest. The area was then prepped and draped in the normal sterile fashion. 1% Lidocaine was used for local anesthesia. Under ultrasound guidance a 6 Fr Safe-T-Centesis catheter was introduced. Thoracentesis was performed. The catheter was removed and a dressing applied. FINDINGS: A total of approximately 1.6 L of cloudy yellow fluid was removed. IMPRESSION: Successful ultrasound guided left thoracentesis yielding 1.6 L of pleural fluid. Read by Candiss Norse, PA-C Electronically Signed   By: Corrie Mckusick D.O.   On: 12/24/2018 11:47   US Thoracentesis Asp Pleural Space W/img Guide  Result Date: 12/08/2018 INDICATION: Patient with history of lymphadenopathy concerning for lymphoma, dyspnea, left pleural effusion. Request made for diagnostic and therapeutic left thoracentesis. EXAM: ULTRASOUND GUIDED DIAGNOSTIC AND THERAPEUTIC LEFT THORACENTESIS MEDICATIONS: None COMPLICATIONS: None immediate. PROCEDURE: An ultrasound guided thoracentesis was thoroughly discussed with the patient and questions answered. The benefits, risks, alternatives and complications were also discussed. The patient understands and wishes to proceed with the procedure. Written consent was obtained. Ultrasound was performed to localize and mark an adequate pocket of fluid in the left chest. The area was then prepped and draped in the normal sterile fashion. 1% Lidocaine was used for local anesthesia. Under ultrasound guidance a 6 Fr Safe-T-Centesis catheter was introduced. Thoracentesis was performed. The catheter was removed and a dressing applied. FINDINGS: A total of approximately 1.2 liters of  turbid, yellow fluid was removed. Samples were sent to the laboratory as requested by the clinical team. Ordering MD was notified of above findings. IMPRESSION: Successful ultrasound guided diagnostic and therapeutic left thoracentesis yielding 1.2 liters of pleural fluid. Read by: Rowe Robert, PA-C Electronically Signed   By:  Jacqulynn Cadet M.D.   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  -Status post 1 cycle of R-CHOP on 12/21/2018 -Tolerated fairly well with exception of weakness, fatigue, mild nausea and vomiting, and neutropenia -Continue antiemetics and allopurinol -The patient will follow-up prior to her next cycle of chemotherapy  Malignant pleural effusion -S/p diagnostic and therapeutic left thoracentesis; fluid positive for B-cell lymphoma -From a symptom standpoint, her dyspnea is improving -I discussed with the patient that with initiation of chemotherapy, the pleural effusion should improve  -If she develops any recurrent progressive dyspnea, we can consider repeating thoracentesis until the chemotherapy has time to take effect   Hyponatremia -Outside sodium was 121 -Thought to be secondary to dehydration -Hydrochlorothiazide was held and she was given IV fluids with improvement of her sodium level -We will monitor closely   Neutropenia -Secondary to recent chemotherapy -She is already received Neulasta -The patient is afebrile and has no signs of infection -We will closely monitor -The patient was instructed to call the office for a fever of 100.4 or higher or any signs of infection  Normocytic anemia -Secondary to anemia of chronic disease and recent chemotherapy -Hgb 11.4 today, stable -Patient denies any symptoms of bleeding -We will monitor it for now   Intermittent nausea -Possibly due to underlying lymphoma and recent chemotherapy -Patient denies any symptoms of bowel obstruction -Continue PRN antiemetics    LOS: 0 days   Mikey Bussing, DNP, AGPCNP-BC, AOCNP 12/28/18

## 2018-12-28 NOTE — Progress Notes (Signed)
Received a voice mail from daughter stating that patient is feeling weak, loss of taste and some shortness of breath. Message was left yesterday at 4:55p.  Returned the call this morning. Patient went to the ED per the advice of the on-call service, and was found to be hyponatremic. She is admitted to the third floor at [facility].  Called the daughter, Ernst Breach, and spoke to her about how she followed the correct chain and was glad she brought her mom to the ED. Answered a few questions about the expectations while patient is in the hospital. Patient currently has a follow up appointment on Friday. If she is discharged, we will follow up with her then.  Dr Maylon Peppers notified of admission.

## 2018-12-31 ENCOUNTER — Inpatient Hospital Stay: Payer: Commercial Managed Care - PPO

## 2018-12-31 ENCOUNTER — Encounter: Payer: Self-pay | Admitting: *Deleted

## 2018-12-31 ENCOUNTER — Encounter: Payer: Self-pay | Admitting: Hematology

## 2018-12-31 ENCOUNTER — Inpatient Hospital Stay (HOSPITAL_BASED_OUTPATIENT_CLINIC_OR_DEPARTMENT_OTHER): Payer: Commercial Managed Care - PPO | Admitting: Hematology

## 2018-12-31 ENCOUNTER — Other Ambulatory Visit: Payer: Self-pay

## 2018-12-31 VITALS — BP 143/77 | HR 96 | Temp 97.1°F | Resp 18 | Ht 60.0 in | Wt 130.0 lb

## 2018-12-31 DIAGNOSIS — C8330 Diffuse large B-cell lymphoma, unspecified site: Secondary | ICD-10-CM

## 2018-12-31 DIAGNOSIS — E876 Hypokalemia: Secondary | ICD-10-CM

## 2018-12-31 DIAGNOSIS — Z5189 Encounter for other specified aftercare: Secondary | ICD-10-CM | POA: Diagnosis not present

## 2018-12-31 DIAGNOSIS — Z5111 Encounter for antineoplastic chemotherapy: Secondary | ICD-10-CM | POA: Diagnosis present

## 2018-12-31 DIAGNOSIS — B3731 Acute candidiasis of vulva and vagina: Secondary | ICD-10-CM

## 2018-12-31 DIAGNOSIS — D649 Anemia, unspecified: Secondary | ICD-10-CM | POA: Diagnosis not present

## 2018-12-31 DIAGNOSIS — B373 Candidiasis of vulva and vagina: Secondary | ICD-10-CM

## 2018-12-31 DIAGNOSIS — E871 Hypo-osmolality and hyponatremia: Secondary | ICD-10-CM | POA: Diagnosis not present

## 2018-12-31 DIAGNOSIS — C8339 Diffuse large B-cell lymphoma, extranodal and solid organ sites: Secondary | ICD-10-CM

## 2018-12-31 DIAGNOSIS — Z95828 Presence of other vascular implants and grafts: Secondary | ICD-10-CM

## 2018-12-31 DIAGNOSIS — C83398 Diffuse large b-cell lymphoma of other extranodal and solid organ sites: Secondary | ICD-10-CM

## 2018-12-31 DIAGNOSIS — E860 Dehydration: Secondary | ICD-10-CM | POA: Diagnosis not present

## 2018-12-31 DIAGNOSIS — J910 Malignant pleural effusion: Secondary | ICD-10-CM | POA: Diagnosis not present

## 2018-12-31 DIAGNOSIS — Z5112 Encounter for antineoplastic immunotherapy: Secondary | ICD-10-CM | POA: Diagnosis present

## 2018-12-31 LAB — CMP (CANCER CENTER ONLY)
ALT: 29 U/L (ref 0–44)
AST: 17 U/L (ref 15–41)
Albumin: 3.5 g/dL (ref 3.5–5.0)
Alkaline Phosphatase: 108 U/L (ref 38–126)
Anion gap: 7 (ref 5–15)
BUN: 10 mg/dL (ref 6–20)
CO2: 29 mmol/L (ref 22–32)
Calcium: 8.3 mg/dL — ABNORMAL LOW (ref 8.9–10.3)
Chloride: 94 mmol/L — ABNORMAL LOW (ref 98–111)
Creatinine: 0.72 mg/dL (ref 0.44–1.00)
GFR, Est AFR Am: >60 mL/min (ref >60)
GFR, Estimated: >60 mL/min (ref >60)
Glucose, Bld: 92 mg/dL (ref 70–99)
Potassium: 3.4 mmol/L — ABNORMAL LOW (ref 3.5–5.1)
Sodium: 130 mmol/L — ABNORMAL LOW (ref 135–145)
Total Bilirubin: 0.2 mg/dL — ABNORMAL LOW (ref 0.3–1.2)
Total Protein: 5.6 g/dL — ABNORMAL LOW (ref 6.5–8.1)

## 2018-12-31 LAB — CBC WITH DIFFERENTIAL (CANCER CENTER ONLY)
Abs Immature Granulocytes: 1.8 10*3/uL — ABNORMAL HIGH (ref 0.00–0.07)
Basophils Absolute: 0 10*3/uL (ref 0.0–0.1)
Basophils Relative: 0 %
Eosinophils Absolute: 0 10*3/uL (ref 0.0–0.5)
Eosinophils Relative: 0 %
HCT: 30.9 % — ABNORMAL LOW (ref 36.0–46.0)
Hemoglobin: 9.9 g/dL — ABNORMAL LOW (ref 12.0–15.0)
Immature Granulocytes: 14 %
Lymphocytes Relative: 8 %
Lymphs Abs: 1.1 10*3/uL (ref 0.7–4.0)
MCH: 25.7 pg — ABNORMAL LOW (ref 26.0–34.0)
MCHC: 32 g/dL (ref 30.0–36.0)
MCV: 80.3 fL (ref 80.0–100.0)
Monocytes Absolute: 1 10*3/uL (ref 0.1–1.0)
Monocytes Relative: 8 %
Neutro Abs: 9 10*3/uL — ABNORMAL HIGH (ref 1.7–7.7)
Neutrophils Relative %: 70 %
Platelet Count: 206 10*3/uL (ref 150–400)
RBC: 3.85 MIL/uL — ABNORMAL LOW (ref 3.87–5.11)
RDW: 17.2 % — ABNORMAL HIGH (ref 11.5–15.5)
WBC Count: 13 10*3/uL — ABNORMAL HIGH (ref 4.0–10.5)
nRBC: 0 % (ref 0.0–0.2)

## 2018-12-31 MED ORDER — HEPARIN SOD (PORK) LOCK FLUSH 100 UNIT/ML IV SOLN
500.0000 [IU] | Freq: Once | INTRAVENOUS | Status: AC
  Administered 2018-12-31: 500 [IU] via INTRAVENOUS
  Filled 2018-12-31: qty 5

## 2018-12-31 MED ORDER — FLUCONAZOLE 100 MG PO TABS: 100.0000 mg | ORAL_TABLET | Freq: Every day | ORAL | 0 refills | Status: AC

## 2018-12-31 MED ORDER — POTASSIUM CHLORIDE CRYS ER 20 MEQ PO TBCR: 20.0000 meq | EXTENDED_RELEASE_TABLET | Freq: Every day | ORAL | 5 refills | Status: DC

## 2018-12-31 MED ORDER — SODIUM CHLORIDE 0.9% FLUSH
10.0000 mL | INTRAVENOUS | Status: DC | PRN
  Administered 2018-12-31: 15:00:00 10 mL via INTRAVENOUS
  Filled 2018-12-31: qty 10

## 2018-12-31 NOTE — Progress Notes (Signed)
Patient here for follow up appointment after first treatment last week. She was admitted earlier this week for hyponatremia. She is doing well today with limited complaints. She feels like she is eating better. No current questions/concerns.   She knows to follow up with me as needed.

## 2018-12-31 NOTE — Patient Instructions (Signed)
Implanted Port Insertion, Care After This sheet gives you information about how to care for yourself after your procedure. Your health care provider may also give you more specific instructions. If you have problems or questions, contact your health care provider. What can I expect after the procedure? After the procedure, it is common to have:  Discomfort at the port insertion site.  Bruising on the skin over the port. This should improve over 3-4 days. Follow these instructions at home: [facility] care  After your port is placed, you will get a manufacturer's information card. The card has information about your port. Keep this card with you at all times.  Take care of the port as told by your health care provider. Ask your health care provider if you or a family member can get training for taking care of the port at home. A home health care nurse may also take care of the port.  Make sure to remember what type of port you have. Incision care      Follow instructions from your health care provider about how to take care of your port insertion site. Make sure you: ? Wash your hands with soap and water before and after you change your bandage (dressing). If soap and water are not available, use hand sanitizer. ? Change your dressing as told by your health care provider. ? Leave stitches (sutures), skin glue, or adhesive strips in place. These skin closures may need to stay in place for 2 weeks or longer. If adhesive strip edges start to loosen and curl up, you may trim the loose edges. Do not remove adhesive strips completely unless your health care provider tells you to do that.  Check your port insertion site every day for signs of infection. Check for: ? Redness, swelling, or pain. ? Fluid or blood. ? Warmth. ? Pus or a bad smell. Activity  Return to your normal activities as told by your health care provider. Ask your health care provider what activities are safe for you.  Do not  lift anything that is heavier than 10 lb (4.5 kg), or the limit that you are told, until your health care provider says that it is safe. General instructions  Take over-the-counter and prescription medicines only as told by your health care provider.  Do not take baths, swim, or use a hot tub until your health care provider approves. Ask your health care provider if you may take showers. You may only be allowed to take sponge baths.  Do not drive for 24 hours if you were given a sedative during your procedure.  Wear a medical alert bracelet in case of an emergency. This will tell any health care providers that you have a port.  Keep all follow-up visits as told by your health care provider. This is important. Contact a health care provider if:  You cannot flush your port with saline as directed, or you cannot draw blood from the port.  You have a fever or chills.  You have redness, swelling, or pain around your port insertion site.  You have fluid or blood coming from your port insertion site.  Your port insertion site feels warm to the touch.  You have pus or a bad smell coming from the port insertion site. Get help right away if:  You have chest pain or shortness of breath.  You have bleeding from your port that you cannot control. Summary  Take care of the port as told by your health  care provider. Keep the manufacturer's information card with you at all times.  Change your dressing as told by your health care provider.  Contact a health care provider if you have a fever or chills or if you have redness, swelling, or pain around your port insertion site.  Keep all follow-up visits as told by your health care provider. This information is not intended to replace advice given to you by your health care provider. Make sure you discuss any questions you have with your health care provider. Document Released: 02/23/2013 Document Revised: 12/01/2017 Document Reviewed: 12/01/2017  Elsevier Patient Education  [place].

## 2018-12-31 NOTE — Progress Notes (Signed)
[facility] OFFICE PROGRESS NOTE  Patient Care Team: [name], MD as PCP - General (Family Medicine)  HEME/ONC OVERVIEW: 1. Stage IV DBLCL, GCB subtype; R-IPI 2, good risk -10/2018: CT AP (for pain) showed abdominal lymphadenopathy, largest 3.2cm mesentery nodal mass encasing the SMA; PET showed widespread FDG-avid lymphadenopathy involving neck, chest and abdomen/pelvis  -11/2018:   Normal LVEF on TTE   Thoracentesis for large left pleural effusion. Cytology positive for B-cell lymphoma.  Bone marrow aspiration and bx: no lymphoma involvement  Cervical LN core bx non-diagnostic   Excisional left cervical LN bx: DLBCL, GCB subtype. Ki-67 30%. FISH pending. -12/2018 - present: R-CHOP (tentatively)  2. Port placement in 11/2018  TREATMENT REGIMEN:  12/21/2018 - present: R-CHOP, tentatively pending FISH panel; plan for 6 cycles   PERTINENT NON-HEM/ONC PROBLEMS: 1. Hx of Crohn's disease, complicated by partial SBO involving TI and right colon; on Lialda -Colonoscopy unremarkable in 2019   ASSESSMENT & PLAN:   Stage IV DLBCL, GCB subtype; good risk by R-IPI  -S/p 1 cycle  of R-CHOP -Patient had some dehydration after the 1st cycle and was admitted for hyponatremia that resolved with fluid resuscitation -Despite multiple attempts to reach out to [facility], we still do not yet have her FISH results; we will continue to follow the [facility] results closely, and adjust her treatment regimen as needed if it is a double-hit lymphoma, albeit less likely  -Cycle 2 scheduled on 01/11/2019  -PRN anti-emetics: Zofran, Compazine, Ativan and dexamethasone -Ppx: allopurinol   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 -S/p thoracentesis x 2 -Recent CXR showed improvement in left pleural effusion -Clinically, patient denies any recurrent dyspnea; no significant effusion on exam -We will monitor it for now   Normocytic anemia -Secondary to anemia of chronic  disease  -Hgb 11.3 today, stable -Patient denies any symptoms of bleeding -We will monitor it for now   Leukocytosis -Most likely secondary to G-CSF -WBC 13.0k, up from recent hospitalization -Patient denies any symptoms of infection -We will monitor it for now  Hyponatremia -Likely due to decreased salt intake -Na 130 today, stable since recent discharge  -Patient is currently drinking 5 to 6 cans of 16oz water today and avidly restricts her salt intake -I encouraged the patient to substitute free water for Pedialyte and also add salt to her diet  -We will monitor it close   Hypokalemia -K 3.4 today, slightly low -I have prescribed KCl 28mq daily  Vaginal yeast infection -Patient reports vaginal itching since starting chemotherapy -I have prescribed fluconazole 1067mdaily x 7 days -If no improvement, she is instructed to contact her PCP for further evaluation  No orders of the defined types were placed in this encounter.  All questions were answered. The patient knows to call the clinic with any problems, questions or concerns. No barriers to learning was detected.  Return on 01/11/2019 for labs, port flush, clinic appointment, and Cycle 2 of R-CHOP.  Denise MenMD 12/31/2018 3:04 PM  CHIEF COMPLAINT: "I am better"  INTERVAL HISTORY: Ms. SaCotrelleturns to clinic for toxicity check after cycle 1 of R-CHOP.  [facility] which [facility] with IV fluid resuscitation.  Patient reports that she drinks at least 5-6 bottles of 16 ounce water per day and occasionally some Pedialyte, but she does not put any salt in her diet.  She reports that since the first cycle of chemotherapy, her appetite has improved significantly.  Her breathing remains overall stable, and she denies any recurrent shortness of breath.  He also reports some vaginal itching since starting chemotherapy, but denies any discharge or apparent lesion.  She denies  any other complaint today.  SUMMARY OF ONCOLOGIC HISTORY: Oncology History  DLBCL (diffuse large B cell lymphoma) ([place])  10/29/2018 Imaging   CT abdomen/pelvis: IMPRESSION: Abdominopelvic lymphadenopathy, including a dominant 3.2 cm short axis jejunal mesentery nodal mass encasing the SMA, partially necrotic. Overall appearance favors lymphoma, although nodal metastases are also possible.   Spleen is normal in size. However, hypoenhancing lesions are suspected on the portal venous phase, raising the possibility of lymphomatous involvement.   Suspected subcarinal nodal mass, incompletely evaluated. Consider CT chest or PET-CT for further evaluation.   Moderate left pleural effusion. Associated left lower lobe opacity, likely atelectasis.   11/10/2018 Imaging   PET: IMPRESSION: 1. 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 2. Nodal metastasis include the lower neck, mediastinum, mesentery, peritoneum and retroperitoneum, and iliac lymph nodes. 3. Spleen and bone marrow normal. 4. Moderate LEFT pleural effusion. 5. Target lymph nodes for sampling could include the LEFT external iliac lymph node, precordial lymph node in the LEFT upper quadrant, or super clavicular/LEFT sub pectoralis nodes.   12/03/2018 Bone Marrow Biopsy   Bone Marrow, Aspirate,Biopsy, and Clot, left posterior iliac crest - NORMOCELLULAR MARROW WITH FOCAL SMALL LYMPHOID AGGREGATES. - SEE COMMENT. PERIPHERAL BLOOD: - BORDERLINE NORMOCYTIC ANEMIA. Diagnosis Note The marrow has only a few small lymphoid aggregates which are not overtly atypical and contain a mixture of B-cells and T-cells. Flow cytometry is negative for a monoclonal B-cell population. These findings are not diagnostic of a lymphoproliferative process.  Accession: [id number] Bone Marrow Flow Cytometry - NO MONOCLONAL B-CELL OR PHENOTYPICALLY ABERRANT T-CELL POPULATION IDENTIFIED.   12/03/2018 Imaging    TTE:  1. The left ventricle has normal systolic function with an ejection fraction of 60-65%. The cavity size was normal. Left ventricular diastolic Doppler parameters are consistent with impaired relaxation.  2. The right ventricle has normal systolic function. The cavity was normal. There is no increase in right ventricular wall thickness.  3. Large pleural effusion.  4. Clinical correlation suggested.  5. The mitral valve is grossly normal.  6. The tricuspid valve is grossly normal.  7. The aortic valve is grossly normal. Aortic valve regurgitation was not assessed by color flow Doppler.  8. The aortic root is normal in size and structure.   12/08/2018 Procedure   Left thoracentesis   12/08/2018 Pathology Results   Diagnosis PLEURAL FLUID, LEFT (SPECIMEN 1 OF 1 COLLECTED 12/08/18): - B-CELL LYMPHOMA - SEE COMMENT   12/09/2018 Procedure   Left supraclavicular LN bx    12/09/2018 Pathology Results   Accession: [id number] Lymph node for lymphoma, left deep cervical - DIFFUSE LARGE B-CELL LYMPHOMA - SEE COMMENT   12/21/2018 -  Chemotherapy   The patient had DOXOrubicin (ADRIAMYCIN) chemo injection 80 mg, 50 mg/m2 = 80 mg, Intravenous,  Once, 1 of 6 cycles Administration: 80 mg (12/21/2018) palonosetron (ALOXI) injection 0.25 mg, 0.25 mg, Intravenous,  Once, 1 of 6 cycles Administration: 0.25 mg (12/21/2018) pegfilgrastim-cbqv (UDENYCA) injection 6 mg, 6 mg, Subcutaneous, Once, 1 of 6 cycles Administration: 6 mg (12/23/2018) vinCRIStine (ONCOVIN) 2 mg in sodium chloride 0.9 % 50 mL chemo infusion, 2 mg, Intravenous,  Once, 1 of 6 cycles Administration: 2 mg (12/21/2018) riTUXimab (RITUXAN) 600 mg in sodium chloride 0.9 % 250 mL (1.9355 mg/mL) infusion, 375 mg/m2 = 600 mg, Intravenous,  Once, 1 of 1 cycle Administration: 600 mg (12/21/2018) cyclophosphamide (CYTOXAN) 1,200 mg in sodium chloride 0.9 % 250 mL chemo infusion, 750 mg/m2 = 1,200 mg, Intravenous,  Once, 1 of 6 cycles Administration:  1,200 mg (12/21/2018) riTUXimab (RITUXAN) 600 mg in sodium chloride 0.9 % 190 mL infusion, 375 mg/m2 = 600 mg (100 % of original dose 375 mg/m2), Intravenous,  Once, 0 of 5 cycles Dose modification: 375 mg/m2 (original dose 375 mg/m2, Cycle 2, Reason: Provider Judgment, Comment: VO to change to RIR) fosaprepitant (EMEND) 150 mg, dexamethasone (DECADRON) 12 mg in sodium chloride 0.9 % 145 mL IVPB, , Intravenous,  Once, 1 of 6 cycles Administration:  (12/21/2018)  for chemotherapy treatment.      REVIEW OF SYSTEMS:   Constitutional: ( - ) fevers, ( - )  chills , ( - ) night sweats Eyes: ( - ) blurriness of vision, ( - ) double vision, ( - ) watery eyes Ears, nose, mouth, throat, and face: ( - ) mucositis, ( - ) sore throat Respiratory: ( - ) cough, ( - ) dyspnea, ( - ) wheezes Cardiovascular: ( - ) palpitation, ( - ) chest discomfort, ( - ) lower extremity swelling Gastrointestinal:  ( - ) nausea, ( - ) heartburn, ( - ) change in bowel habits Skin: ( - ) abnormal skin rashes Lymphatics: ( - ) new lymphadenopathy, ( - ) easy bruising Neurological: ( - ) numbness, ( - ) tingling, ( - ) new weaknesses Behavioral/Psych: ( - ) mood change, ( - ) new changes  All other systems were reviewed with the patient and are negative.  I have reviewed the past medical history, past surgical history, social history and family history with the patient and they are unchanged from previous note.  ALLERGIES:  is allergic to nsaids.  MEDICATIONS:  Current Outpatient Medications  Medication Sig Dispense Refill  . allopurinol (ZYLOPRIM) 300 MG tablet Take 1 tablet (300 mg total) by mouth daily. 30 tablet 3  . amLODipine (NORVASC) 5 MG tablet Take 5 mg by mouth daily.     . calcium carbonate (TUMS - DOSED IN MG ELEMENTAL CALCIUM) 500 MG chewable tablet Chew 2 tablets by mouth daily as needed for indigestion or heartburn.    . cholecalciferol (VITAMIN D3) 25 MCG (1000 UT) tablet Take 1,000 Units by mouth daily.     Marland Kitchen HYDROcodone-acetaminophen (NORCO) 5-325 MG tablet Take 1-2 tablets by mouth every 6 (six) hours as needed for moderate pain or severe pain. 20 tablet 0  . lidocaine-prilocaine (EMLA) cream Apply to affected area once 30 g 3  . lovastatin (MEVACOR) 40 MG tablet Take 40 mg by mouth daily.     . Melatonin 3 MG TABS Take 3 mg by mouth at bedtime as needed (sleep).    . mesalamine (LIALDA) 1.2 g EC tablet Take 2.4 g by mouth daily.     . Nutritional Supplements (IMMUNE ENHANCE) TABS Take 1 tablet by mouth daily.    . ondansetron (ZOFRAN) 8 MG tablet Take 1 tablet (8 mg total) by mouth 2 (two) times daily as needed for refractory nausea / vomiting. Start on day 3 after cyclophosphamide chemotherapy. 30 tablet 1  . OVER THE COUNTER MEDICATION Take 2 tablets by mouth daily as needed (acid reflux). Soothing digestive relief otc    . polyethylene glycol (MIRALAX / GLYCOLAX) 17 g packet Take 17 g by mouth daily as needed for mild constipation.    . potassium chloride (KLOR-CON) 8 MEQ  tablet Take 8 mEq by mouth daily.     . prochlorperazine (COMPAZINE) 10 MG tablet Take 1 tablet (10 mg total) by mouth every 6 (six) hours as needed (Nausea or vomiting). 30 tablet 6  . traMADol (ULTRAM) 50 MG tablet Take 1 tablet (50 mg total) by mouth every 8 (eight) hours as needed. (Patient taking differently: Take 50 mg by mouth every 8 (eight) hours as needed for moderate pain. ) 45 tablet 1  . traZODone (DESYREL) 50 MG tablet Take 1 tablet (50 mg total) by mouth at bedtime as needed for sleep. 30 tablet 3   No current facility-administered medications for this visit.     PHYSICAL EXAMINATION: ECOG PERFORMANCE STATUS: 1 - Symptomatic but completely ambulatory  Today's Vitals   12/31/18 1451  PainSc: 0-No pain   There is no height or weight on file to calculate BMI.  There were no vitals filed for this visit.  GENERAL: alert, no distress and comfortable SKIN: skin color, texture, turgor are normal, no rashes or  significant lesions; did not perform any vaginal exam  EYES: conjunctiva are pink and non-injected, sclera clear OROPHARYNX: no exudate, no erythema; lips, buccal mucosa, and tongue normal  NECK: supple, non-tender LYMPH:  no palpable lymphadenopathy in the cervical LUNGS: clear to auscultation with normal breathing effort HEART: regular rate & rhythm and no murmurs and no lower extremity edema ABDOMEN: soft, non-tender, non-distended, normal bowel sounds Musculoskeletal: no cyanosis of digits and no clubbing  PSYCH: alert & oriented x 3, fluent speech NEURO: no focal motor/sensory deficits  LABORATORY DATA:  I have reviewed the data as listed    Component Value Date/Time   NA 129 (L) 12/28/2018 1322   K 3.6 12/28/2018 1322   CL 94 (L) 12/28/2018 1322   CO2 23 12/28/2018 1322   GLUCOSE 124 (H) 12/28/2018 1322   BUN 11 12/28/2018 1322   CREATININE 0.66 12/28/2018 1322   CREATININE 1.21 (H) 12/21/2018 0900   CALCIUM 8.4 (L) 12/28/2018 1322   PROT 5.8 (L) 12/28/2018 0516   ALBUMIN 3.1 (L) 12/28/2018 0516   AST 16 12/28/2018 0516   AST 20 12/21/2018 0900   ALT 53 (H) 12/28/2018 0516   ALT 19 12/21/2018 0900   ALKPHOS 83 12/28/2018 0516   BILITOT 0.6 12/28/2018 0516   BILITOT 0.3 12/21/2018 0900   GFRNONAA >60 12/28/2018 1322   GFRNONAA 49 (L) 12/21/2018 0900   GFRAA >60 12/28/2018 1322   GFRAA 57 (L) 12/21/2018 0900    No results found for: SPEP, UPEP  Lab Results  Component Value Date   WBC 0.4 (LL) 12/28/2018   NEUTROABS 0.1 (L) 12/28/2018   HGB 11.4 (L) 12/28/2018   HCT 34.7 (L) 12/28/2018   MCV 79.6 (L) 12/28/2018   PLT 157 12/28/2018      Chemistry      Component Value Date/Time   NA 129 (L) 12/28/2018 1322   K 3.6 12/28/2018 1322   CL 94 (L) 12/28/2018 1322   CO2 23 12/28/2018 1322   BUN 11 12/28/2018 1322   CREATININE 0.66 12/28/2018 1322   CREATININE 1.21 (H) 12/21/2018 0900      Component Value Date/Time   CALCIUM 8.4 (L) 12/28/2018 1322   ALKPHOS  83 12/28/2018 0516   AST 16 12/28/2018 0516   AST 20 12/21/2018 0900   ALT 53 (H) 12/28/2018 0516   ALT 19 12/21/2018 0900   BILITOT 0.6 12/28/2018 0516   BILITOT 0.3 12/21/2018 0900  RADIOGRAPHIC STUDIES: I have personally reviewed the radiological images as listed below and agreed with the findings in the report. Dg Chest 1 View  Result Date: 12/24/2018 CLINICAL DATA:  Status post thoracentesis EXAM: CHEST  1 VIEW COMPARISON:  December 21, 2018 FINDINGS: No pneumothorax evident. Left pleural effusion is much smaller currently following thoracentesis. Small residual pleural effusion remains with left base atelectasis. Lungs elsewhere are clear. Heart is slightly enlarged with pulmonary vascularity normal. There is aortic atherosclerosis. Port-A-Cath tip is in the superior vena cava. There is mild degenerative change in each shoulder. IMPRESSION: No evident pneumothorax. Small residual left pleural effusion with left base atelectasis. Lungs elsewhere clear. Heart mildly enlarged. Aortic Atherosclerosis (ICD10-I70.0). Electronically Signed   By: Lowella Grip III M.D.   On: 12/24/2018 11:44   Dg Chest 1 View  Result Date: 12/08/2018 CLINICAL DATA:  Status post left thoracentesis EXAM: CHEST  1 VIEW COMPARISON:  PET-CT from 11/10/2018 FINDINGS: Cardiac shadow is stable. Right chest wall port is noted. Left basilar atelectasis is noted with tiny residual effusion following thoracentesis. No pneumothorax is noted. No acute bony abnormality is seen. IMPRESSION: Minimal residual left pleural effusion with mild atelectasis. No pneumothorax is noted. Electronically Signed   By: Inez Catalina M.D.   On: 12/08/2018 15:59   Dg Chest 2 View  Result Date: 12/22/2018 CLINICAL DATA:  Shortness of breath EXAM: CHEST - 2 VIEW COMPARISON:  12/08/2018 FINDINGS: Right Port-A-Cath remains in place, unchanged. Large left pleural effusion, enlarging since prior study. Left lower lobe atelectasis. Small right  effusion with right base atelectasis. Heart is normal size. No acute bony abnormality. IMPRESSION: Large left pleural effusion and small right pleural effusion. Bibasilar atelectasis. Electronically Signed   By: Rolm Baptise M.D.   On: 12/22/2018 00:31   Dg Chest Port 1 View  Result Date: 12/28/2018 CLINICAL DATA:  Shortness of breath EXAM: PORTABLE CHEST 1 VIEW COMPARISON:  Four days ago FINDINGS: Small left pleural effusion. There is presumed adjacent atelectasis. No edema or air bronchogram. Normal heart size and mediastinal contours. Right IJ catheter with tip at the upper SVC. IMPRESSION: 1. Stable from study 4 days ago. 2. Small left pleural effusion. Electronically Signed   By: Monte Fantasia M.D.   On: 12/28/2018 04:53   US Thoracentesis Asp Pleural Space W/img Guide  Result Date: 12/24/2018 INDICATION: Patient with history of lymphoma, recurrent pleural effusion. Request for therapeutic thoracentesis today in IR. EXAM: ULTRASOUND GUIDED LEFT THORACENTESIS MEDICATIONS: 10 mL 1% lidocaine. COMPLICATIONS: None immediate. PROCEDURE: An ultrasound guided thoracentesis was thoroughly discussed with the patient and questions answered. The benefits, risks, alternatives and complications were also discussed. The patient understands and wishes to proceed with the procedure. Written consent was obtained. Ultrasound was performed to localize and mark an adequate pocket of fluid in the left chest. The area was then prepped and draped in the normal sterile fashion. 1% Lidocaine was used for local anesthesia. Under ultrasound guidance a 6 Fr Safe-T-Centesis catheter was introduced. Thoracentesis was performed. The catheter was removed and a dressing applied. FINDINGS: A total of approximately 1.6 L of cloudy yellow fluid was removed. IMPRESSION: Successful ultrasound guided left thoracentesis yielding 1.6 L of pleural fluid. Read by Candiss Norse, PA-C Electronically Signed   By: Corrie Mckusick D.O.   On:  12/24/2018 11:47   US Thoracentesis Asp Pleural Space W/img Guide  Result Date: 12/08/2018 INDICATION: Patient with history of lymphadenopathy concerning for lymphoma, dyspnea, left pleural effusion. Request made for diagnostic  and therapeutic left thoracentesis. EXAM: ULTRASOUND GUIDED DIAGNOSTIC AND THERAPEUTIC LEFT THORACENTESIS MEDICATIONS: None COMPLICATIONS: None immediate. PROCEDURE: An ultrasound guided thoracentesis was thoroughly discussed with the patient and questions answered. The benefits, risks, alternatives and complications were also discussed. The patient understands and wishes to proceed with the procedure. Written consent was obtained. Ultrasound was performed to localize and mark an adequate pocket of fluid in the left chest. The area was then prepped and draped in the normal sterile fashion. 1% Lidocaine was used for local anesthesia. Under ultrasound guidance a 6 Fr Safe-T-Centesis catheter was introduced. Thoracentesis was performed. The catheter was removed and a dressing applied. FINDINGS: A total of approximately 1.2 liters of turbid, yellow fluid was removed. Samples were sent to the laboratory as requested by the clinical team. Ordering MD was notified of above findings. IMPRESSION: Successful ultrasound guided diagnostic and therapeutic left thoracentesis yielding 1.2 liters of pleural fluid. Read by: Rowe Robert, PA-C Electronically Signed   By: Jacqulynn Cadet M.D.   On: 12/08/2018 16:28

## 2019-01-03 ENCOUNTER — Telehealth: Payer: Self-pay | Admitting: *Deleted

## 2019-01-03 ENCOUNTER — Telehealth: Payer: Self-pay | Admitting: Hematology

## 2019-01-03 ENCOUNTER — Encounter (HOSPITAL_COMMUNITY): Payer: Self-pay | Admitting: General Surgery

## 2019-01-03 ENCOUNTER — Other Ambulatory Visit: Payer: Self-pay | Admitting: Hematology

## 2019-01-03 NOTE — Telephone Encounter (Signed)
No change in appts per 8/14 los

## 2019-01-03 NOTE — Telephone Encounter (Signed)
Called bed placement for pt preadmit 8/24 for Inpt chemotherapy x 4-5 days. Admitting will call pt to confirm.

## 2019-01-03 NOTE — Progress Notes (Signed)
START ON PATHWAY REGIMEN - Lymphoma and CLL     A cycle is every 21 days:     Prednisone      Rituximab-xxxx      Etoposide      Doxorubicin      Vincristine      Cyclophosphamide      Filgrastim-xxxx   **Always confirm dose/schedule in your pharmacy ordering system**  Patient Characteristics: Double Hit Lymphoma, First Line Disease Type: Not Applicable Disease Type: Double Hit Lymphoma Disease Type: Not Applicable Line of therapy: First Line Intent of Therapy: Curative Intent, Discussed with Patient

## 2019-01-03 NOTE — Progress Notes (Signed)
ALERT: A disease instance has been permanently removed from this patient's pathway record and replaced with a new disease instance. Information on the new disease instance will be transmitted in a separate message.  Disease Being Removed: Lymphoma and CLL  Reason for Removal: Reason not listed

## 2019-01-04 ENCOUNTER — Encounter: Payer: Self-pay | Admitting: *Deleted

## 2019-01-04 ENCOUNTER — Inpatient Hospital Stay: Payer: Commercial Managed Care - PPO

## 2019-01-04 ENCOUNTER — Telehealth: Payer: Self-pay | Admitting: Hematology

## 2019-01-04 ENCOUNTER — Other Ambulatory Visit: Payer: Self-pay | Admitting: Family

## 2019-01-04 ENCOUNTER — Encounter: Payer: Self-pay | Admitting: Hematology

## 2019-01-04 ENCOUNTER — Other Ambulatory Visit: Payer: Self-pay

## 2019-01-04 ENCOUNTER — Inpatient Hospital Stay (HOSPITAL_BASED_OUTPATIENT_CLINIC_OR_DEPARTMENT_OTHER): Payer: Commercial Managed Care - PPO | Admitting: Hematology

## 2019-01-04 VITALS — BP 129/70 | HR 93 | Temp 98.0°F | Resp 17

## 2019-01-04 DIAGNOSIS — C83398 Diffuse large b-cell lymphoma of other extranodal and solid organ sites: Secondary | ICD-10-CM

## 2019-01-04 DIAGNOSIS — C8339 Diffuse large B-cell lymphoma, extranodal and solid organ sites: Secondary | ICD-10-CM

## 2019-01-04 DIAGNOSIS — D72829 Elevated white blood cell count, unspecified: Secondary | ICD-10-CM

## 2019-01-04 DIAGNOSIS — C8330 Diffuse large B-cell lymphoma, unspecified site: Secondary | ICD-10-CM

## 2019-01-04 DIAGNOSIS — D649 Anemia, unspecified: Secondary | ICD-10-CM | POA: Diagnosis not present

## 2019-01-04 DIAGNOSIS — T451X5A Adverse effect of antineoplastic and immunosuppressive drugs, initial encounter: Secondary | ICD-10-CM

## 2019-01-04 DIAGNOSIS — J910 Malignant pleural effusion: Secondary | ICD-10-CM | POA: Diagnosis not present

## 2019-01-04 DIAGNOSIS — R110 Nausea: Secondary | ICD-10-CM | POA: Diagnosis not present

## 2019-01-04 DIAGNOSIS — Z5112 Encounter for antineoplastic immunotherapy: Secondary | ICD-10-CM | POA: Diagnosis not present

## 2019-01-04 LAB — CBC WITH DIFFERENTIAL (CANCER CENTER ONLY)
Abs Immature Granulocytes: 1 10*3/uL — ABNORMAL HIGH (ref 0.00–0.07)
Basophils Absolute: 0.1 10*3/uL (ref 0.0–0.1)
Basophils Relative: 1 %
Eosinophils Absolute: 0 10*3/uL (ref 0.0–0.5)
Eosinophils Relative: 0 %
HCT: 32.2 % — ABNORMAL LOW (ref 36.0–46.0)
Hemoglobin: 10.3 g/dL — ABNORMAL LOW (ref 12.0–15.0)
Immature Granulocytes: 9 %
Lymphocytes Relative: 11 %
Lymphs Abs: 1.2 10*3/uL (ref 0.7–4.0)
MCH: 26.1 pg (ref 26.0–34.0)
MCHC: 32 g/dL (ref 30.0–36.0)
MCV: 81.7 fL (ref 80.0–100.0)
Monocytes Absolute: 1 10*3/uL (ref 0.1–1.0)
Monocytes Relative: 9 %
Neutro Abs: 7.9 10*3/uL — ABNORMAL HIGH (ref 1.7–7.7)
Neutrophils Relative %: 70 %
Platelet Count: 235 10*3/uL (ref 150–400)
RBC: 3.94 MIL/uL (ref 3.87–5.11)
RDW: 18 % — ABNORMAL HIGH (ref 11.5–15.5)
WBC Count: 11.2 10*3/uL — ABNORMAL HIGH (ref 4.0–10.5)
nRBC: 0.3 % — ABNORMAL HIGH (ref 0.0–0.2)

## 2019-01-04 LAB — CMP (CANCER CENTER ONLY)
ALT: 23 U/L (ref 0–44)
AST: 16 U/L (ref 15–41)
Albumin: 3.7 g/dL (ref 3.5–5.0)
Alkaline Phosphatase: 102 U/L (ref 38–126)
Anion gap: 10 (ref 5–15)
BUN: 7 mg/dL (ref 6–20)
CO2: 28 mmol/L (ref 22–32)
Calcium: 8.5 mg/dL — ABNORMAL LOW (ref 8.9–10.3)
Chloride: 94 mmol/L — ABNORMAL LOW (ref 98–111)
Creatinine: 0.67 mg/dL (ref 0.44–1.00)
GFR, Est AFR Am: >60 mL/min (ref >60)
GFR, Estimated: >60 mL/min (ref >60)
Glucose, Bld: 92 mg/dL (ref 70–99)
Potassium: 4.1 mmol/L (ref 3.5–5.1)
Sodium: 132 mmol/L — ABNORMAL LOW (ref 135–145)
Total Bilirubin: 0.2 mg/dL — ABNORMAL LOW (ref 0.3–1.2)
Total Protein: 6 g/dL — ABNORMAL LOW (ref 6.5–8.1)

## 2019-01-04 NOTE — Progress Notes (Signed)
[place] OFFICE PROGRESS NOTE  Patient Care Team: [name], MD as PCP - General (Family Medicine)  HEME/ONC OVERVIEW: 1. Stage IV DBLCL, GCB subtype; R-IPI 2; Myc, BCL2 rearrangement positive  -10/2018: CT AP (for pain) showed abdominal lymphadenopathy, largest 3.2cm mesentery nodal mass encasing the SMA; PET showed widespread FDG-avid lymphadenopathy involving neck, chest and abdomen/pelvis  -11/2018:   Normal LVEF on TTE   Thoracentesis for large left pleural effusion. Cytology positive for B-cell lymphoma.  Bone marrow aspiration and bx: no lymphoma involvement  Cervical LN core bx non-diagnostic   Excisional left cervical LN bx: DLBCL, GCB subtype. Ki-67 30%. Myc, BCL2 rearrangement positive -12/2018 - present: R-CHOP x 1 cycle; changed to R-EPOCH due to on double hit lymphoma  2. Port placement in 11/2018  TREATMENT REGIMEN:  12/21/2018: R-CHOP w/ G-CSF x 1   01/10/2019 - present: R-EPOCH w/ G-CSF, plan for 5-6 cycles, due to double hit lymphoma  PERTINENT NON-HEM/ONC PROBLEMS: 1. 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 The intent is for cure. -We discussed some of the risks, benefits and side-effects of Rituximab, Etoposide, Vincristine, Adriamycin, Cytoxan, Prednisone. The regimen requires inpatient admission due to continuous chemotherapy infusion  -Some of the short term side-effects included, though not limited to, risk of  fatigue, weight loss, tumor lysis syndrome, risk of allergic reactions, pancytopenia, life-threatening infections, need for transfusions of blood products, nausea, vomiting, change in bowel habits, hair loss, risk of congestive heart failure, admission to hospital for various reasons, and risks of death.  -Long term side-effects are also discussed including permanent damage to nerve function, chronic fatigue, and rare secondary malignancy including bone marrow disorders.  -The patient is aware that the response rates discussed earlier is not guaranteed.   -After a long discussion, patient made an informed decision to proceed with the prescribed plan of care.  -The 1st cycle of EPOCH is scheduled for 01/10/2019 (complete on 01/14/2019); she will return to clinic on 01/17/2019 for outpatient Rituximab and Udenyca  -Due to the logistical challenges, patient's care will be transitioned to Dr. 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 -S/p thoracentesis x 2 -Recent CXR showed improvement in left pleural effusion -Clinically, patient denies any recurrent dyspnea; no significant effusion on exam -We will monitor it for now   Normocytic anemia -Secondary to anemia of chronic disease  -Hgb 10.3, stable  -Patient denies any symptoms of bleeding -We will monitor it for now   Leukocytosis -Most likely secondary to G-CSF -WBC 11.2k, improving -Patient denies any symptoms of infection -We will monitor it for now  Hyponatremia -Na 132, improving -Continue Pedialyte and gentle salt addition to the diet, and maintain adequate hydration  -We will monitor it close ly  Hypokalemia -K 4.1 today, improving -Continue KCl 5mq daily   Chemotherapy-associated nausea  -Secondary to chemotherapy -Symptoms relatively well  controlled  -Continue  PRN-anti-emetics   No orders of the defined types were placed in this encounter.  All questions were answered. The patient knows to call the clinic with any problems, questions or concerns. No barriers to learning was detected.  EPOCH scheduled on 01/10/2019. Rituximab and Udenyca on 01/17/2019. Return to clinic for labs, port flush and clinic appt with Dr. Irene Limbo on 01/24/2019.   Tish Men, MD 01/04/2019 3:11 PM  CHIEF COMPLAINT: "I am doing better"  INTERVAL HISTORY: Ms. Matt returns to clinic for follow-up of DLBCL s/p 1 cycle of R-CHOP.  Patient reports that since the last visit, she has been eating much better and her appetite has improved significantly.  She only had 1 day of nausea after the recent chemotherapy, but did not have any vomiting.  The anti-emetics helped relieve her symptoms.  She has not had any recurrent shortness of breath or chest discomfort since the last thoracentesis.  She denies any other complaint today.  SUMMARY OF ONCOLOGIC HISTORY: Oncology History  DLBCL (diffuse large B cell lymphoma) ([place])  10/29/2018 Imaging   CT abdomen/pelvis: IMPRESSION: Abdominopelvic lymphadenopathy, including a dominant 3.2 cm short axis jejunal mesentery nodal mass encasing the SMA, partially necrotic. Overall appearance favors lymphoma, although nodal metastases are also possible.   Spleen is normal in size. However, hypoenhancing lesions are suspected on the portal venous phase, raising the possibility of lymphomatous involvement.   Suspected subcarinal nodal mass, incompletely evaluated. Consider CT chest or PET-CT for further evaluation.   Moderate left pleural effusion. Associated left lower lobe opacity, likely atelectasis.   11/10/2018 Imaging   PET: IMPRESSION: 1. 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 2. Nodal metastasis include the lower neck, mediastinum, mesentery, peritoneum and retroperitoneum, and iliac lymph  nodes. 3. Spleen and bone marrow normal. 4. Moderate LEFT pleural effusion. 5. Target lymph nodes for sampling could include the LEFT external iliac lymph node, precordial lymph node in the LEFT upper quadrant, or super clavicular/LEFT sub pectoralis nodes.   12/03/2018 Bone Marrow Biopsy   Bone Marrow, Aspirate,Biopsy, and Clot, left posterior iliac crest - NORMOCELLULAR MARROW WITH FOCAL SMALL LYMPHOID AGGREGATES. - SEE COMMENT. PERIPHERAL BLOOD: - BORDERLINE NORMOCYTIC ANEMIA. Diagnosis Note The marrow has only a few small lymphoid aggregates which are not overtly atypical and contain a mixture of B-cells and T-cells. Flow cytometry is negative for a monoclonal B-cell population. These findings are not diagnostic of a lymphoproliferative process.  Accession: [id number] Bone Marrow Flow Cytometry - NO MONOCLONAL B-CELL OR PHENOTYPICALLY ABERRANT T-CELL POPULATION IDENTIFIED.   12/03/2018 Imaging   TTE:  1. The left ventricle has normal systolic function with an ejection fraction of 60-65%. The cavity size was normal. Left ventricular diastolic Doppler parameters are consistent with impaired relaxation.  2. The right ventricle has normal systolic function. The cavity was normal. There is no increase in right ventricular wall thickness.  3. Large pleural effusion.  4. Clinical correlation suggested.  5. The mitral valve is grossly normal.  6. The tricuspid valve is grossly normal.  7. The aortic valve is grossly normal. Aortic valve regurgitation was not assessed by color flow Doppler.  8. The aortic root is normal in size and structure.   12/08/2018 Procedure   Left thoracentesis   12/08/2018 Pathology Results   Diagnosis PLEURAL FLUID, LEFT (SPECIMEN 1 OF 1 COLLECTED 12/08/18): - B-CELL LYMPHOMA - SEE COMMENT   12/09/2018 Procedure   Left supraclavicular LN bx    12/09/2018 Pathology Results   Accession:  [id number] Lymph node for lymphoma, left deep cervical - DIFFUSE LARGE  B-CELL LYMPHOMA - SEE COMMENT   12/21/2018 - 01/10/2019 Chemotherapy   The patient had DOXOrubicin (ADRIAMYCIN) chemo injection 80 mg, 50 mg/m2 = 80 mg, Intravenous,  Once, 1 of 6 cycles Administration: 80 mg (12/21/2018) palonosetron (ALOXI) injection 0.25 mg, 0.25 mg, Intravenous,  Once, 1 of 6 cycles Administration: 0.25 mg (12/21/2018) pegfilgrastim-cbqv (UDENYCA) injection 6 mg, 6 mg, Subcutaneous, Once, 1 of 6 cycles Administration: 6 mg (12/23/2018) vinCRIStine (ONCOVIN) 2 mg in sodium chloride 0.9 % 50 mL chemo infusion, 2 mg, Intravenous,  Once, 1 of 6 cycles Administration: 2 mg (12/21/2018) riTUXimab (RITUXAN) 600 mg in sodium chloride 0.9 % 250 mL (1.9355 mg/mL) infusion, 375 mg/m2 = 600 mg, Intravenous,  Once, 1 of 1 cycle Administration: 600 mg (12/21/2018) cyclophosphamide (CYTOXAN) 1,200 mg in sodium chloride 0.9 % 250 mL chemo infusion, 750 mg/m2 = 1,200 mg, Intravenous,  Once, 1 of 6 cycles Administration: 1,200 mg (12/21/2018) riTUXimab (RITUXAN) 600 mg in sodium chloride 0.9 % 190 mL infusion, 375 mg/m2 = 600 mg (100 % of original dose 375 mg/m2), Intravenous,  Once, 0 of 5 cycles Dose modification: 375 mg/m2 (original dose 375 mg/m2, Cycle 2, Reason: Provider Judgment, Comment: VO to change to RIR) fosaprepitant (EMEND) 150 mg, dexamethasone (DECADRON) 12 mg in sodium chloride 0.9 % 145 mL IVPB, , Intravenous,  Once, 1 of 6 cycles Administration:  (12/21/2018)  for chemotherapy treatment.    01/10/2019 -  Chemotherapy   The patient had DOXOrubicin (ADRIAMYCIN) 16 mg, etoposide (VEPESID) 80 mg, vinCRIStine (ONCOVIN) 0.6 mg in sodium chloride 0.9 % 1,000 mL chemo infusion, , Intravenous, Once, 0 of 4 cycles ondansetron (ZOFRAN) 8 mg, dexamethasone (DECADRON) 10 mg in sodium chloride 0.9 % 50 mL IVPB, , Intravenous,  Once, 0 of 4 cycles cyclophosphamide (CYTOXAN) 1,180 mg in sodium chloride 0.9 % 250 mL chemo infusion, 750 mg/m2 = 1,180 mg, Intravenous,  Once, 0 of 4 cycles  for  chemotherapy treatment.    01/17/2019 -  Chemotherapy   The patient had pegfilgrastim-cbqv (UDENYCA) injection 6 mg, 6 mg, Subcutaneous, Once, 0 of 4 cycles riTUXimab (RITUXAN) 600 mg in sodium chloride 0.9 % 250 mL (1.9355 mg/mL) infusion, 375 mg/m2 = 600 mg, Intravenous,  Once, 0 of 4 cycles  for chemotherapy treatment.      REVIEW OF SYSTEMS:   Constitutional: ( - ) fevers, ( - )  chills , ( - ) night sweats Eyes: ( - ) blurriness of vision, ( - ) double vision, ( - ) watery eyes Ears, nose, mouth, throat, and face: ( - ) mucositis, ( - ) sore throat Respiratory: ( - ) cough, ( - ) dyspnea, ( - ) wheezes Cardiovascular: ( - ) palpitation, ( - ) chest discomfort, ( - ) lower extremity swelling Gastrointestinal:  ( - ) nausea, ( - ) heartburn, ( - ) change in bowel habits Skin: ( - ) abnormal skin rashes Lymphatics: ( - ) new lymphadenopathy, ( - ) easy bruising Neurological: ( - ) numbness, ( - ) tingling, ( - ) new weaknesses Behavioral/Psych: ( - ) mood change, ( - ) new changes  All other systems were reviewed with the patient and are negative.  I have reviewed the past medical history, past surgical history, social history and family history with the patient and they are unchanged from previous note.  ALLERGIES:  is allergic to nsaids.  MEDICATIONS:  Current Outpatient Medications  Medication  Sig Dispense Refill  . allopurinol (ZYLOPRIM) 300 MG tablet Take 1 tablet (300 mg total) by mouth daily. 30 tablet 3  . amLODipine (NORVASC) 5 MG tablet Take 5 mg by mouth daily.     . calcium carbonate (TUMS - DOSED IN MG ELEMENTAL CALCIUM) 500 MG chewable tablet Chew 2 tablets by mouth daily as needed for indigestion or heartburn.    . cholecalciferol (VITAMIN D3) 25 MCG (1000 UT) tablet Take 1,000 Units by mouth daily.    . fluconazole (DIFLUCAN) 100 MG tablet Take 1 tablet (100 mg total) by mouth daily for 7 days. 7 tablet 0  . HYDROcodone-acetaminophen (NORCO) 5-325 MG tablet Take 1-2  tablets by mouth every 6 (six) hours as needed for moderate pain or severe pain. 20 tablet 0  . lidocaine-prilocaine (EMLA) cream Apply to affected area once 30 g 3  . lovastatin (MEVACOR) 40 MG tablet Take 40 mg by mouth daily.     . Melatonin 3 MG TABS Take 3 mg by mouth at bedtime as needed (sleep).    . mesalamine (LIALDA) 1.2 g EC tablet Take 2.4 g by mouth daily.     . Nutritional Supplements (IMMUNE ENHANCE) TABS Take 1 tablet by mouth daily.    . ondansetron (ZOFRAN) 8 MG tablet Take 1 tablet (8 mg total) by mouth 2 (two) times daily as needed for refractory nausea / vomiting. Start on day 3 after cyclophosphamide chemotherapy. 30 tablet 1  . OVER THE COUNTER MEDICATION Take 2 tablets by mouth daily as needed (acid reflux). Soothing digestive relief otc    . polyethylene glycol (MIRALAX / GLYCOLAX) 17 g packet Take 17 g by mouth daily as needed for mild constipation.    . potassium chloride SA (K-DUR) 20 MEQ tablet Take 1 tablet (20 mEq total) by mouth daily. 30 tablet 5  . prochlorperazine (COMPAZINE) 10 MG tablet Take 1 tablet (10 mg total) by mouth every 6 (six) hours as needed (Nausea or vomiting). 30 tablet 6  . traMADol (ULTRAM) 50 MG tablet Take 1 tablet (50 mg total) by mouth every 8 (eight) hours as needed. (Patient taking differently: Take 50 mg by mouth every 8 (eight) hours as needed for moderate pain. ) 45 tablet 1  . traZODone (DESYREL) 50 MG tablet Take 1 tablet (50 mg total) by mouth at bedtime as needed for sleep. 30 tablet 3   No current facility-administered medications for this visit.     PHYSICAL EXAMINATION: ECOG PERFORMANCE STATUS: 1 - Symptomatic but completely ambulatory  Today's Vitals   01/04/19 1507  BP: 129/70  Pulse: 93  Resp: 17  Temp: 98 F (36.7 C)  TempSrc: Oral  SpO2: 100%   There is no height or weight on file to calculate BMI.  There were no vitals filed for this visit.  GENERAL: alert, no distress and comfortable SKIN: skin color,  texture, turgor are normal, no rashes or significant lesions EYES: conjunctiva are pink and non-injected, sclera clear OROPHARYNX: no exudate, no erythema; lips, buccal mucosa, and tongue normal  NECK: supple, non-tender LUNGS: slightly decreased air movement in the left lung base, air movement relatively normal throughout, no wheezing or rhonchi  HEART: regular rate & rhythm and no murmurs and trace bilateral lower extremity edema ABDOMEN: soft, non-tender, non-distended, normal bowel sounds Musculoskeletal: no cyanosis of digits and no clubbing  PSYCH: alert & oriented x 3, fluent speech NEURO: no focal motor/sensory deficits  LABORATORY DATA:  I have reviewed the data as listed  Component Value Date/Time   NA 130 (L) 12/31/2018 1430   K 3.4 (L) 12/31/2018 1430   CL 94 (L) 12/31/2018 1430   CO2 29 12/31/2018 1430   GLUCOSE 92 12/31/2018 1430   BUN 10 12/31/2018 1430   CREATININE 0.72 12/31/2018 1430   CALCIUM 8.3 (L) 12/31/2018 1430   PROT 5.6 (L) 12/31/2018 1430   ALBUMIN 3.5 12/31/2018 1430   AST 17 12/31/2018 1430   ALT 29 12/31/2018 1430   ALKPHOS 108 12/31/2018 1430   BILITOT 0.2 (L) 12/31/2018 1430   GFRNONAA >60 12/31/2018 1430   GFRAA >60 12/31/2018 1430    No results found for: SPEP, UPEP  Lab Results  Component Value Date   WBC 11.2 (H) 01/04/2019   NEUTROABS PENDING 01/04/2019   HGB 10.3 (L) 01/04/2019   HCT 32.2 (L) 01/04/2019   MCV 81.7 01/04/2019   PLT 235 01/04/2019      Chemistry      Component Value Date/Time   NA 130 (L) 12/31/2018 1430   K 3.4 (L) 12/31/2018 1430   CL 94 (L) 12/31/2018 1430   CO2 29 12/31/2018 1430   BUN 10 12/31/2018 1430   CREATININE 0.72 12/31/2018 1430      Component Value Date/Time   CALCIUM 8.3 (L) 12/31/2018 1430   ALKPHOS 108 12/31/2018 1430   AST 17 12/31/2018 1430   ALT 29 12/31/2018 1430   BILITOT 0.2 (L) 12/31/2018 1430       RADIOGRAPHIC STUDIES: I have personally reviewed the radiological images as  listed below and agreed with the findings in the report. Dg Chest 1 View  Result Date: 12/24/2018 CLINICAL DATA:  Status post thoracentesis EXAM: CHEST  1 VIEW COMPARISON:  December 21, 2018 FINDINGS: No pneumothorax evident. Left pleural effusion is much smaller currently following thoracentesis. Small residual pleural effusion remains with left base atelectasis. Lungs elsewhere are clear. Heart is slightly enlarged with pulmonary vascularity normal. There is aortic atherosclerosis. Port-A-Cath tip is in the superior vena cava. There is mild degenerative change in each shoulder. IMPRESSION: No evident pneumothorax. Small residual left pleural effusion with left base atelectasis. Lungs elsewhere clear. Heart mildly enlarged. Aortic Atherosclerosis (ICD10-I70.0). Electronically Signed   By: Lowella Grip III M.D.   On: 12/24/2018 11:44   Dg Chest 1 View  Result Date: 12/08/2018 CLINICAL DATA:  Status post left thoracentesis EXAM: CHEST  1 VIEW COMPARISON:  PET-CT from 11/10/2018 FINDINGS: Cardiac shadow is stable. Right chest wall port is noted. Left basilar atelectasis is noted with tiny residual effusion following thoracentesis. No pneumothorax is noted. No acute bony abnormality is seen. IMPRESSION: Minimal residual left pleural effusion with mild atelectasis. No pneumothorax is noted. Electronically Signed   By: Inez Catalina M.D.   On: 12/08/2018 15:59   Dg Chest 2 View  Result Date: 12/22/2018 CLINICAL DATA:  Shortness of breath EXAM: CHEST - 2 VIEW COMPARISON:  12/08/2018 FINDINGS: Right Port-A-Cath remains in place, unchanged. Large left pleural effusion, enlarging since prior study. Left lower lobe atelectasis. Small right effusion with right base atelectasis. Heart is normal size. No acute bony abnormality. IMPRESSION: Large left pleural effusion and small right pleural effusion. Bibasilar atelectasis. Electronically Signed   By: Rolm Baptise M.D.   On: 12/22/2018 00:31   Dg Chest Port 1  View  Result Date: 12/28/2018 CLINICAL DATA:  Shortness of breath EXAM: PORTABLE CHEST 1 VIEW COMPARISON:  Four days ago FINDINGS: Small left pleural effusion. There is presumed adjacent atelectasis. No edema or  air bronchogram. Normal heart size and mediastinal contours. Right IJ catheter with tip at the upper SVC. IMPRESSION: 1. Stable from study 4 days ago. 2. Small left pleural effusion. Electronically Signed   By: Monte Fantasia M.D.   On: 12/28/2018 04:53   US Thoracentesis Asp Pleural Space W/img Guide  Result Date: 12/24/2018 INDICATION: Patient with history of lymphoma, recurrent pleural effusion. Request for therapeutic thoracentesis today in IR. EXAM: ULTRASOUND GUIDED LEFT THORACENTESIS MEDICATIONS: 10 mL 1% lidocaine. COMPLICATIONS: None immediate. PROCEDURE: An ultrasound guided thoracentesis was thoroughly discussed with the patient and questions answered. The benefits, risks, alternatives and complications were also discussed. The patient understands and wishes to proceed with the procedure. Written consent was obtained. Ultrasound was performed to localize and mark an adequate pocket of fluid in the left chest. The area was then prepped and draped in the normal sterile fashion. 1% Lidocaine was used for local anesthesia. Under ultrasound guidance a 6 Fr Safe-T-Centesis catheter was introduced. Thoracentesis was performed. The catheter was removed and a dressing applied. FINDINGS: A total of approximately 1.6 L of cloudy yellow fluid was removed. IMPRESSION: Successful ultrasound guided left thoracentesis yielding 1.6 L of pleural fluid. Read by Candiss Norse, PA-C Electronically Signed   By: Corrie Mckusick D.O.   On: 12/24/2018 11:47   US Thoracentesis Asp Pleural Space W/img Guide  Result Date: 12/08/2018 INDICATION: Patient with history of lymphadenopathy concerning for lymphoma, dyspnea, left pleural effusion. Request made for diagnostic and therapeutic left thoracentesis. EXAM:  ULTRASOUND GUIDED DIAGNOSTIC AND THERAPEUTIC LEFT THORACENTESIS MEDICATIONS: None COMPLICATIONS: None immediate. PROCEDURE: An ultrasound guided thoracentesis was thoroughly discussed with the patient and questions answered. The benefits, risks, alternatives and complications were also discussed. The patient understands and wishes to proceed with the procedure. Written consent was obtained. Ultrasound was performed to localize and mark an adequate pocket of fluid in the left chest. The area was then prepped and draped in the normal sterile fashion. 1% Lidocaine was used for local anesthesia. Under ultrasound guidance a 6 Fr Safe-T-Centesis catheter was introduced. Thoracentesis was performed. The catheter was removed and a dressing applied. FINDINGS: A total of approximately 1.2 liters of turbid, yellow fluid was removed. Samples were sent to the laboratory as requested by the clinical team. Ordering MD was notified of above findings. IMPRESSION: Successful ultrasound guided diagnostic and therapeutic left thoracentesis yielding 1.2 liters of pleural fluid. Read by: Rowe Robert, PA-C Electronically Signed   By: Jacqulynn Cadet M.D.   On: 12/08/2018 16:28

## 2019-01-04 NOTE — Telephone Encounter (Signed)
Appts as scheduled  Per 8/18 los

## 2019-01-04 NOTE — Progress Notes (Signed)
Due to patient FISH results and change in treatment plan she will be transferring care to Dr Irene Limbo in our [place] location.   Called patient's daughter Denise Walls and let her know that if she or the patient needed me, they could continue to reach out, as there is not a lymphoma navigator at [facility]. She appreciated the information and will reach out as needed.   Dr Irene Limbo also notified.

## 2019-01-05 ENCOUNTER — Encounter: Payer: Self-pay | Admitting: *Deleted

## 2019-01-05 ENCOUNTER — Other Ambulatory Visit: Payer: Self-pay | Admitting: *Deleted

## 2019-01-05 NOTE — Progress Notes (Signed)
Patient is going to be admitted to [facility] for chemo on Monday. She requires pre-procedure COVID testing.   After several challenges, patient has been scheduled for COVID testing tomorrow. Spoke with [place] and gave her all the specific instructions including date, time, location/address. She knows to call me if there are any further needs.

## 2019-01-06 ENCOUNTER — Other Ambulatory Visit (HOSPITAL_COMMUNITY)
Admission: RE | Admit: 2019-01-06 | Discharge: 2019-01-06 | Disposition: A | Discharge status: Home / Self Care | Payer: Commercial Managed Care - PPO | Source: Ambulatory Visit | Attending: Hematology | Admitting: Hematology

## 2019-01-06 DIAGNOSIS — Z20828 Contact with and (suspected) exposure to other viral communicable diseases: Secondary | ICD-10-CM | POA: Insufficient documentation

## 2019-01-06 DIAGNOSIS — Z01812 Encounter for preprocedural laboratory examination: Secondary | ICD-10-CM | POA: Diagnosis present

## 2019-01-07 LAB — SARS CORONAVIRUS 2 (TAT 6-24 HRS): SARS Coronavirus 2: NEGATIVE

## 2019-01-10 ENCOUNTER — Inpatient Hospital Stay (HOSPITAL_COMMUNITY)
Admission: RE | Admit: 2019-01-10 | Discharge: 2019-01-14 | DRG: 847 | Disposition: A | Discharge status: Home / Self Care | Payer: Commercial Managed Care - PPO | Attending: Hematology | Admitting: Hematology

## 2019-01-10 ENCOUNTER — Encounter (HOSPITAL_COMMUNITY): Payer: Self-pay

## 2019-01-10 ENCOUNTER — Other Ambulatory Visit: Payer: Self-pay

## 2019-01-10 DIAGNOSIS — Z7902 Long term (current) use of antithrombotics/antiplatelets: Secondary | ICD-10-CM | POA: Diagnosis not present

## 2019-01-10 DIAGNOSIS — Z20828 Contact with and (suspected) exposure to other viral communicable diseases: Secondary | ICD-10-CM | POA: Diagnosis present

## 2019-01-10 DIAGNOSIS — T451X5A Adverse effect of antineoplastic and immunosuppressive drugs, initial encounter: Secondary | ICD-10-CM | POA: Diagnosis not present

## 2019-01-10 DIAGNOSIS — J910 Malignant pleural effusion: Secondary | ICD-10-CM | POA: Diagnosis present

## 2019-01-10 DIAGNOSIS — C8330 Diffuse large B-cell lymphoma, unspecified site: Secondary | ICD-10-CM

## 2019-01-10 DIAGNOSIS — Z5111 Encounter for antineoplastic chemotherapy: Principal | ICD-10-CM

## 2019-01-10 DIAGNOSIS — I10 Essential (primary) hypertension: Secondary | ICD-10-CM | POA: Diagnosis present

## 2019-01-10 DIAGNOSIS — K5090 Crohn's disease, unspecified, without complications: Secondary | ICD-10-CM | POA: Diagnosis present

## 2019-01-10 DIAGNOSIS — E871 Hypo-osmolality and hyponatremia: Secondary | ICD-10-CM | POA: Diagnosis present

## 2019-01-10 DIAGNOSIS — Z79899 Other long term (current) drug therapy: Secondary | ICD-10-CM

## 2019-01-10 DIAGNOSIS — E876 Hypokalemia: Secondary | ICD-10-CM | POA: Diagnosis present

## 2019-01-10 DIAGNOSIS — Z8719 Personal history of other diseases of the digestive system: Secondary | ICD-10-CM

## 2019-01-10 DIAGNOSIS — D72829 Elevated white blood cell count, unspecified: Secondary | ICD-10-CM | POA: Diagnosis present

## 2019-01-10 DIAGNOSIS — Z8619 Personal history of other infectious and parasitic diseases: Secondary | ICD-10-CM | POA: Diagnosis not present

## 2019-01-10 DIAGNOSIS — D630 Anemia in neoplastic disease: Secondary | ICD-10-CM | POA: Diagnosis present

## 2019-01-10 DIAGNOSIS — R110 Nausea: Secondary | ICD-10-CM | POA: Diagnosis not present

## 2019-01-10 DIAGNOSIS — C8338 Diffuse large B-cell lymphoma, lymph nodes of multiple sites: Secondary | ICD-10-CM | POA: Diagnosis not present

## 2019-01-10 DIAGNOSIS — G4700 Insomnia, unspecified: Secondary | ICD-10-CM | POA: Diagnosis present

## 2019-01-10 DIAGNOSIS — K5900 Constipation, unspecified: Secondary | ICD-10-CM

## 2019-01-10 DIAGNOSIS — D649 Anemia, unspecified: Secondary | ICD-10-CM | POA: Diagnosis present

## 2019-01-10 DIAGNOSIS — C8339 Diffuse large B-cell lymphoma, extranodal and solid organ sites: Secondary | ICD-10-CM | POA: Diagnosis present

## 2019-01-10 DIAGNOSIS — K1230 Oral mucositis (ulcerative), unspecified: Secondary | ICD-10-CM

## 2019-01-10 LAB — CBC WITH DIFFERENTIAL/PLATELET
Abs Immature Granulocytes: 0.1 10*3/uL — ABNORMAL HIGH (ref 0.00–0.07)
Basophils Absolute: 0.2 10*3/uL — ABNORMAL HIGH (ref 0.0–0.1)
Basophils Relative: 2 %
Eosinophils Absolute: 0 10*3/uL (ref 0.0–0.5)
Eosinophils Relative: 0 %
HCT: 32.3 % — ABNORMAL LOW (ref 36.0–46.0)
Hemoglobin: 10.3 g/dL — ABNORMAL LOW (ref 12.0–15.0)
Immature Granulocytes: 1 %
Lymphocytes Relative: 12 %
Lymphs Abs: 0.9 10*3/uL (ref 0.7–4.0)
MCH: 27.5 pg (ref 26.0–34.0)
MCHC: 31.9 g/dL (ref 30.0–36.0)
MCV: 86.4 fL (ref 80.0–100.0)
Monocytes Absolute: 0.7 10*3/uL (ref 0.1–1.0)
Monocytes Relative: 9 %
Neutro Abs: 6 10*3/uL (ref 1.7–7.7)
Neutrophils Relative %: 76 %
Platelets: 388 10*3/uL (ref 150–400)
RBC: 3.74 MIL/uL — ABNORMAL LOW (ref 3.87–5.11)
RDW: 20.3 % — ABNORMAL HIGH (ref 11.5–15.5)
WBC: 7.9 10*3/uL (ref 4.0–10.5)
nRBC: 0 % (ref 0.0–0.2)

## 2019-01-10 LAB — PHOSPHORUS
Phosphorus: 4.4 mg/dL (ref 2.5–4.6)
Phosphorus: 4.4 mg/dL (ref 2.5–4.6)

## 2019-01-10 LAB — COMPREHENSIVE METABOLIC PANEL
ALT: 27 U/L (ref 0–44)
AST: 21 U/L (ref 15–41)
Albumin: 3.5 g/dL (ref 3.5–5.0)
Alkaline Phosphatase: 68 U/L (ref 38–126)
Anion gap: 11 (ref 5–15)
BUN: 14 mg/dL (ref 6–20)
CO2: 25 mmol/L (ref 22–32)
Calcium: 8.6 mg/dL — ABNORMAL LOW (ref 8.9–10.3)
Chloride: 98 mmol/L (ref 98–111)
Creatinine, Ser: 0.64 mg/dL (ref 0.44–1.00)
GFR calc Af Amer: >60 mL/min (ref >60)
GFR calc non Af Amer: >60 mL/min (ref >60)
Glucose, Bld: 86 mg/dL (ref 70–99)
Potassium: 3.6 mmol/L (ref 3.5–5.1)
Sodium: 134 mmol/L — ABNORMAL LOW (ref 135–145)
Total Bilirubin: 0.5 mg/dL (ref 0.3–1.2)
Total Protein: 6 g/dL — ABNORMAL LOW (ref 6.5–8.1)

## 2019-01-10 LAB — MAGNESIUM: Magnesium: 1.9 mg/dL (ref 1.7–2.4)

## 2019-01-10 LAB — TSH: TSH: 3.306 u[IU]/mL (ref 0.350–4.500)

## 2019-01-10 LAB — URIC ACID: Uric Acid, Serum: 2.6 mg/dL (ref 2.5–7.1)

## 2019-01-10 MED ORDER — POLYETHYLENE GLYCOL 3350 17 G PO PACK: 17.0000 g | PACK | Freq: Every day | ORAL | Status: DC | PRN

## 2019-01-10 MED ORDER — SODIUM CHLORIDE 0.9 % IV SOLN
Freq: Once | INTRAVENOUS | Status: AC
  Administered 2019-01-10: 18 mg via INTRAVENOUS
  Filled 2019-01-10: qty 4

## 2019-01-10 MED ORDER — HYDROCODONE-ACETAMINOPHEN 5-325 MG PO TABS: 1.0000 | ORAL_TABLET | Freq: Four times a day (QID) | ORAL | Status: DC | PRN

## 2019-01-10 MED ORDER — AMLODIPINE BESYLATE 5 MG PO TABS: 5.0000 mg | ORAL_TABLET | Freq: Every day | ORAL | Status: DC

## 2019-01-10 MED ORDER — PREDNISONE 50 MG PO TABS
60.0000 mg | ORAL_TABLET | Freq: Every day | ORAL | Status: AC
  Administered 2019-01-10 – 2019-01-14 (×5): 60 mg via ORAL
  Filled 2019-01-10 (×5): qty 1

## 2019-01-10 MED ORDER — SODIUM CHLORIDE 0.9% FLUSH
10.0000 mL | Freq: Two times a day (BID) | INTRAVENOUS | Status: DC
  Administered 2019-01-10 – 2019-01-14 (×4): 10 mL

## 2019-01-10 MED ORDER — VINCRISTINE SULFATE CHEMO INJECTION 1 MG/ML
Freq: Once | INTRAVENOUS | Status: AC
  Administered 2019-01-10: 15:00:00 via INTRAVENOUS
  Filled 2019-01-10: qty 8

## 2019-01-10 MED ORDER — ENOXAPARIN SODIUM 40 MG/0.4ML ~~LOC~~ SOLN: 40.0000 mg | SUBCUTANEOUS | Status: DC

## 2019-01-10 MED ORDER — SODIUM CHLORIDE 0.9 % IV SOLN
INTRAVENOUS | Status: DC
  Administered 2019-01-10 – 2019-01-12 (×2): via INTRAVENOUS

## 2019-01-10 MED ORDER — ENOXAPARIN SODIUM 40 MG/0.4ML ~~LOC~~ SOLN
40.0000 mg | SUBCUTANEOUS | Status: DC
  Administered 2019-01-10 – 2019-01-14 (×5): 40 mg via SUBCUTANEOUS
  Filled 2019-01-10 (×5): qty 0.4

## 2019-01-10 MED ORDER — MESALAMINE 1.2 G PO TBEC
2.4000 g | DELAYED_RELEASE_TABLET | Freq: Every day | ORAL | Status: DC
  Administered 2019-01-10 – 2019-01-14 (×5): 2.4 g via ORAL
  Filled 2019-01-10 (×5): qty 2

## 2019-01-10 MED ORDER — TRAZODONE HCL 50 MG PO TABS
25.0000 mg | ORAL_TABLET | Freq: Every evening | ORAL | Status: DC | PRN
  Filled 2019-01-10: qty 1

## 2019-01-10 MED ORDER — SODIUM CHLORIDE 0.9% FLUSH
10.0000 mL | INTRAVENOUS | Status: DC | PRN
  Administered 2019-01-14: 14:00:00 10 mL
  Filled 2019-01-10: qty 40

## 2019-01-10 MED ORDER — ALLOPURINOL 300 MG PO TABS
300.0000 mg | ORAL_TABLET | Freq: Every day | ORAL | Status: DC
  Administered 2019-01-10: 15:00:00 300 mg via ORAL
  Filled 2019-01-10: qty 1

## 2019-01-10 MED ORDER — TRAZODONE HCL 50 MG PO TABS: 50.0000 mg | ORAL_TABLET | Freq: Every evening | ORAL | Status: DC | PRN

## 2019-01-10 NOTE — H&P (Addendum)
[facility]  [phone] Fax:([phone]   MEDICAL ONCOLOGY ADMISSION HISTORY & PHYSICAL  Chief complaint: Admission for cycle 1R-EPOCH for diffuse large B-cell lymphoma  HPI: Denise Walls is a 58 year old female with a past medical history significant for anxiety, Crohn's disease, hypertension, hyperlipidemia, migraine headaches.  The patient's history is obtained through a [place] interpreter via video, Denise Walls [phone].  The patient reports that she has history of Crohn's disease, for which she has been taking mesalamine with very good control of her symptoms.  She was previously followed by [name] of gastroenterology and to transition her care to a different gastroenterologist after [name] moved to [place].  Her new gastroenterologist stopped her mesalamine some time in 2019, and soon after that, she began to develop new onset left lower quadrant discomfort/pain, nonradiating, dull, intermittent, mild to moderate in intensity, exacerbated by eating and improved with defecation.  She underwent colonoscopy, which was unremarkable, and was recommended to modify her diet without any improvement.  She then re-established her care with [name], who ordered CT abdomen/pelvis that showed diffuse abdominal lymphadenopathy.  PET showed diffuse lymph node involvement in the neck, chest, abdomen and the pelvis, and patient was referred to oncology for further evaluation.  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 The patient initially received 1 cycle of R-CHOP  which she tolerated fairly well with exception of hyponatremia and neutropenia.  Prior to her second planned cycle of chemotherapy, she was found to be positive for Myc and BCL-2 rearrangement.  She is now being switched to [facility] and is here for cycle #1 of his chemotherapy.  Today, the patient reports that she is feeling well  overall.  She reports a very good appetite and has been eating a lot at home.  She reports that her breathing is stable and maybe even slightly improved.  She denies any dizziness, headaches, chest discomfort, cough.  Denies abdominal pain, nausea, vomiting, constipation, diarrhea.  Denies bleeding.  The patient is here for admission for cycle #1 of R-EPOCH.    Past Medical History:  Diagnosis Date  . Acute cystitis   . Anxiety   . Arthritis   . Crohn disease ([place])    dx 2004, history of small bowel obstruction 02/2007 treated conservatively  . History of colon polyps   . History of shingles   . HTN (hypertension)   . Hypercholesterolemia   . Insomnia   . Migraine   . Uterine fibroid   :  Past Surgical History:  Procedure Laterality Date  . COLONOSCOPY  09/11/2014   Small internal hemorrhoids. Otherwise normal colonoscopy to terminal ileum  . COLONOSCOPY  10/02/2017  . ESOPHAGOGASTRODUODENOSCOPY  04/05/2007   Mild gastritis. Otherwise, normal esophagogastroduodenoscopy  . IR IMAGING GUIDED PORT INSERTION  11/29/2018  . LYMPH NODE BIOPSY Left 12/09/2018   Procedure: LEFT DEEP CERVICAL LYMPH NODE BIOPSY;  Surgeon: Fanny Skates, MD;  Location: [facility];  Service: General;  Laterality: Left;  . TUBAL LIGATION    :  Current Facility-Administered Medications  Medication Dose Route Frequency Provider Last Rate Last Dose  . allopurinol (ZYLOPRIM) tablet 300 mg  300 mg Oral Daily [name], MD      . amLODipine (NORVASC) tablet 5 mg  5 mg Oral Daily [name], MD      . enoxaparin (LOVENOX) injection 40 mg  40 mg Subcutaneous Q24H [name], MD      .  HYDROcodone-acetaminophen (NORCO/VICODIN) 5-325 MG per tablet 1-2 tablet  1-2 tablet Oral Q6H PRN [name], MD      . mesalamine (LIALDA) EC tablet 2.4 g  2.4 g Oral Daily Irene Limbo, Cloria Spring, MD      . polyethylene glycol (MIRALAX / GLYCOLAX) packet 17 g  17 g Oral Daily PRN [name], MD      . sodium chloride flush (NS) 0.9 % injection 10-40 mL  10-40 mL Intracatheter Q12H Kale, Cloria Spring, MD      . sodium chloride flush (NS) 0.9 % injection 10-40 mL  10-40 mL Intracatheter PRN [name], MD      . traZODone (DESYREL) tablet 25 mg  25 mg Oral QHS PRN Maryanna Shape, NP         Allergies  Allergen Reactions  . Nsaids Other (See Comments)    Abdominal pain, GI Bleeding   :  Family History  Problem Relation Age of Onset  . Colon cancer Neg Hx   :  Social History   Socioeconomic History  . Marital status: Married    Spouse name: Not on file  . Number of children: Not on file  . Years of education: Not on file  . Highest education level: Not on file  Occupational History  . Not on file  Social Needs  . Financial resource strain: Not on file  . Food insecurity    Worry: Not on file    Inability: Not on file  . Transportation needs    Medical: Not on file    Non-medical: Not on file  Tobacco Use  . Smoking status: Never Smoker  . Smokeless tobacco: Never Used  Substance and Sexual Activity  . Alcohol use: Never    Frequency: Never  . Drug use: Never  . Sexual activity: Yes    Birth control/protection: Post-menopausal  Lifestyle  . Physical activity    Days per week: Not on file    Minutes per session: Not on file  . Stress: Not on file  Relationships  . Social Herbalist on phone: Not on file    Gets together: Not on file    Attends religious service: Not on file    Active member of club or organization: Not on file    Attends meetings of clubs or organizations: Not on file    Relationship status: Not on file  . Intimate partner violence    Fear of current or ex partner: Not on file    Emotionally abused: Not on file    Physically abused: Not on file    Forced sexual activity: Not on file  Other Topics Concern  . Not on file  Social History Narrative  . Not on file  :   Review of systems: A  comprehensive 14 point review of systems was negative except as noted in the HPI.  Exam: Patient Vitals for the past 24 hrs:  BP Temp Temp src Pulse Resp SpO2  01/10/19 0900 123/81 98.7 F (37.1 C) Oral 90 19 97 %    General:  well-nourished in no acute distress.   Eyes:  no scleral icterus.   ENT:  There were no oropharyngeal lesions.   Neck was without thyromegaly.   Lymphatics:  Negative cervical, supraclavicular or axillary adenopathy.   Respiratory: Diminished in the left base, otherwise clear.   Cardiovascular:  Regular rate and rhythm, S1/S2, without murmur, rub or gallop.  There was  no pedal edema.   GI:  abdomen was soft, flat, nontender, nondistended, without organomegaly.  Muscoloskeletal:  no spinal tenderness of palpation of vertebral spine.   Skin exam was without echymosis, petichae.   Neuro exam was nonfocal.  Patient was alert and oriented.  Attention was good.   Language was appropriate.  Mood was normal without depression.  Speech was not pressured.  Thought content was not tangential.     Lab Results  Component Value Date   WBC 11.2 (H) 01/04/2019   HGB 10.3 (L) 01/04/2019   HCT 32.2 (L) 01/04/2019   PLT 235 01/04/2019   GLUCOSE 92 01/04/2019   ALT 23 01/04/2019   AST 16 01/04/2019   NA 132 (L) 01/04/2019   K 4.1 01/04/2019   CL 94 (L) 01/04/2019   CREATININE 0.67 01/04/2019   BUN 7 01/04/2019   CO2 28 01/04/2019    Dg Chest 1 View  Result Date: 12/24/2018 CLINICAL DATA:  Status post thoracentesis EXAM: CHEST  1 VIEW COMPARISON:  December 21, 2018 FINDINGS: No pneumothorax evident. Left pleural effusion is much smaller currently following thoracentesis. Small residual pleural effusion remains with left base atelectasis. Lungs elsewhere are clear. Heart is slightly enlarged with pulmonary vascularity normal. There is aortic atherosclerosis. Port-A-Cath tip is in the superior vena cava. There is mild degenerative change in each shoulder. IMPRESSION: No evident  pneumothorax. Small residual left pleural effusion with left base atelectasis. Lungs elsewhere clear. Heart mildly enlarged. Aortic Atherosclerosis (ICD10-I70.0). Electronically Signed   By: Lowella Grip III M.D.   On: 12/24/2018 11:44   Dg Chest 2 View  Result Date: 12/22/2018 CLINICAL DATA:  Shortness of breath EXAM: CHEST - 2 VIEW COMPARISON:  12/08/2018 FINDINGS: Right Port-A-Cath remains in place, unchanged. Large left pleural effusion, enlarging since prior study. Left lower lobe atelectasis. Small right effusion with right base atelectasis. Heart is normal size. No acute bony abnormality. IMPRESSION: Large left pleural effusion and small right pleural effusion. Bibasilar atelectasis. Electronically Signed   By: Rolm Baptise M.D.   On: 12/22/2018 00:31   Dg Chest Port 1 View  Result Date: 12/28/2018 CLINICAL DATA:  Shortness of breath EXAM: PORTABLE CHEST 1 VIEW COMPARISON:  Four days ago FINDINGS: Small left pleural effusion. There is presumed adjacent atelectasis. No edema or air bronchogram. Normal heart size and mediastinal contours. Right IJ catheter with tip at the upper SVC. IMPRESSION: 1. Stable from study 4 days ago. 2. Small left pleural effusion. Electronically Signed   By: Monte Fantasia M.D.   On: 12/28/2018 04:53   US Thoracentesis Asp Pleural Space W/img Guide  Result Date: 12/24/2018 INDICATION: Patient with history of lymphoma, recurrent pleural effusion. Request for therapeutic thoracentesis today in IR. EXAM: ULTRASOUND GUIDED LEFT THORACENTESIS MEDICATIONS: 10 mL 1% lidocaine. COMPLICATIONS: None immediate. PROCEDURE: An ultrasound guided thoracentesis was thoroughly discussed with the patient and questions answered. The benefits, risks, alternatives and complications were also discussed. The patient understands and wishes to proceed with the procedure. Written consent was obtained. Ultrasound was performed to localize and mark an adequate pocket of fluid in the left chest.  The area was then prepped and draped in the normal sterile fashion. 1% Lidocaine was used for local anesthesia. Under ultrasound guidance a 6 Fr Safe-T-Centesis catheter was introduced. Thoracentesis was performed. The catheter was removed and a dressing applied. FINDINGS: A total of approximately 1.6 L of cloudy yellow fluid was removed. IMPRESSION: Successful ultrasound guided left thoracentesis yielding 1.6 L of pleural fluid.  Read by Candiss Norse, PA-C Electronically Signed   By: Corrie Mckusick D.O.   On: 12/24/2018 11:47    Dg Chest 1 View  Result Date: 12/24/2018 CLINICAL DATA:  Status post thoracentesis EXAM: CHEST  1 VIEW COMPARISON:  December 21, 2018 FINDINGS: No pneumothorax evident. Left pleural effusion is much smaller currently following thoracentesis. Small residual pleural effusion remains with left base atelectasis. Lungs elsewhere are clear. Heart is slightly enlarged with pulmonary vascularity normal. There is aortic atherosclerosis. Port-A-Cath tip is in the superior vena cava. There is mild degenerative change in each shoulder. IMPRESSION: No evident pneumothorax. Small residual left pleural effusion with left base atelectasis. Lungs elsewhere clear. Heart mildly enlarged. Aortic Atherosclerosis (ICD10-I70.0). Electronically Signed   By: Lowella Grip III M.D.   On: 12/24/2018 11:44   Dg Chest 2 View  Result Date: 12/22/2018 CLINICAL DATA:  Shortness of breath EXAM: CHEST - 2 VIEW COMPARISON:  12/08/2018 FINDINGS: Right Port-A-Cath remains in place, unchanged. Large left pleural effusion, enlarging since prior study. Left lower lobe atelectasis. Small right effusion with right base atelectasis. Heart is normal size. No acute bony abnormality. IMPRESSION: Large left pleural effusion and small right pleural effusion. Bibasilar atelectasis. Electronically Signed   By: Rolm Baptise M.D.   On: 12/22/2018 00:31   Dg Chest Port 1 View  Result Date: 12/28/2018 CLINICAL DATA:  Shortness  of breath EXAM: PORTABLE CHEST 1 VIEW COMPARISON:  Four days ago FINDINGS: Small left pleural effusion. There is presumed adjacent atelectasis. No edema or air bronchogram. Normal heart size and mediastinal contours. Right IJ catheter with tip at the upper SVC. IMPRESSION: 1. Stable from study 4 days ago. 2. Small left pleural effusion. Electronically Signed   By: Monte Fantasia M.D.   On: 12/28/2018 04:53   US Thoracentesis Asp Pleural Space W/img Guide  Result Date: 12/24/2018 INDICATION: Patient with history of lymphoma, recurrent pleural effusion. Request for therapeutic thoracentesis today in IR. EXAM: ULTRASOUND GUIDED LEFT THORACENTESIS MEDICATIONS: 10 mL 1% lidocaine. COMPLICATIONS: None immediate. PROCEDURE: An ultrasound guided thoracentesis was thoroughly discussed with the patient and questions answered. The benefits, risks, alternatives and complications were also discussed. The patient understands and wishes to proceed with the procedure. Written consent was obtained. Ultrasound was performed to localize and mark an adequate pocket of fluid in the left chest. The area was then prepped and draped in the normal sterile fashion. 1% Lidocaine was used for local anesthesia. Under ultrasound guidance a 6 Fr Safe-T-Centesis catheter was introduced. Thoracentesis was performed. The catheter was removed and a dressing applied. FINDINGS: A total of approximately 1.6 L of cloudy yellow fluid was removed. IMPRESSION: Successful ultrasound guided left thoracentesis yielding 1.6 L of pleural fluid. Read by Candiss Norse, PA-C Electronically Signed   By: Corrie Mckusick D.O.   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 The intent is for cure. -We discussed some of the risks, benefits and side-effects of Rituximab, Etoposide, Vincristine, Adriamycin, Cytoxan, Prednisone. The regimen requires inpatient admission due to continuous chemotherapy infusion  -Some of the short term side-effects included,  though not limited to, risk of fatigue, weight loss, tumor lysis syndrome, risk of allergic reactions, pancytopenia, life-threatening infections, need for transfusions of blood products, nausea, vomiting, change in bowel habits, hair loss, risk of congestive heart failure, admission to hospital for various reasons, and risks of death.  -Long term side-effects are also discussed including permanent damage to nerve function, chronic fatigue, and rare secondary malignancy including bone marrow disorders.  -The patient is aware that the response rates discussed earlier is not guaranteed.   -After a long discussion, patient made an informed decision to proceed with the prescribed plan of care.  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 -S/p thoracentesis x 2 -Recent CXR showed improvement in left pleural effusion -Clinically, patient denies any recurrent dyspnea; no significant effusion on exam -We will monitor it for now   Normocytic anemia -Secondary to anemia of chronic disease  -Hgb  on 8/18 was 10.3, pending today -Patient denies any symptoms of bleeding -We will monitor it for now   Leukocytosis -Most likely secondary to G-CSF -WBC 11.2k on 8/18, pending today -Patient denies any symptoms of infection -We will monitor it for now  Hyponatremia -Na 132 on 8/18,  pending today -Continue Pedialyte and gentle salt addition to the diet, and maintain adequate hydration  -We will monitor it closely  Hypokalemia -K 4.1 on 8/18, pending today -Has been on potassium chloride 20 mEq daily as an outpatient.  Will await labs from today before ordering.   Chemotherapy-associated nausea  -Secondary to chemotherapy -Symptoms relatively well controlled  -Continue PRN-anti-emetics   All questions were answered. The patient knows to call the clinic with any problems, questions or concerns. No barriers to learning was detected.  EPOCH scheduled on 01/10/2019. Rituximab and Udenyca on 01/17/2019. Return to clinic for labs, port flush and clinic appt with Dr. Irene Limbo on 01/24/2019.   Mikey Bussing, DNP, AGPCNP-BC, AOCNP   ADDENDUM  .Patient was Personally and independently interviewed, examined and relevant elements of the history of present illness were reviewed in details and an assessment and plan was created. All elements of the patient's history of present illness , assessment and plan were discussed in details with Mikey Bussing, DNP. The above documentation reflects our combined findings assessment and plan.  Sullivan Lone MD MS

## 2019-01-10 NOTE — Progress Notes (Signed)
Chemo education noted on 12/20/2018. Chemo education provided today for Etoposide chemotherapy via interpreter including side effects. Pt verbalized understanding. Chemo consent signed and placed in the chart.

## 2019-01-10 NOTE — Progress Notes (Signed)
Chemothearpy dosing independently checked with Laural Benes, RN for Adriamycin, Etoposide, and Vincristine.  Chemotherapy dosing checked based on patient's BSA and normal dosing.

## 2019-01-11 ENCOUNTER — Ambulatory Visit: Payer: Commercial Managed Care - PPO | Admitting: Family

## 2019-01-11 ENCOUNTER — Ambulatory Visit: Payer: Commercial Managed Care - PPO

## 2019-01-11 ENCOUNTER — Other Ambulatory Visit: Payer: Commercial Managed Care - PPO

## 2019-01-11 DIAGNOSIS — Z5111 Encounter for antineoplastic chemotherapy: Secondary | ICD-10-CM

## 2019-01-11 DIAGNOSIS — C8338 Diffuse large B-cell lymphoma, lymph nodes of multiple sites: Secondary | ICD-10-CM

## 2019-01-11 LAB — MAGNESIUM: Magnesium: 2 mg/dL (ref 1.7–2.4)

## 2019-01-11 LAB — CBC WITH DIFFERENTIAL/PLATELET
Abs Immature Granulocytes: 0.06 10*3/uL (ref 0.00–0.07)
Basophils Absolute: 0 10*3/uL (ref 0.0–0.1)
Basophils Relative: 0 %
Eosinophils Absolute: 0 10*3/uL (ref 0.0–0.5)
Eosinophils Relative: 0 %
HCT: 32.4 % — ABNORMAL LOW (ref 36.0–46.0)
Hemoglobin: 10.2 g/dL — ABNORMAL LOW (ref 12.0–15.0)
Immature Granulocytes: 1 %
Lymphocytes Relative: 5 %
Lymphs Abs: 0.5 10*3/uL — ABNORMAL LOW (ref 0.7–4.0)
MCH: 26.8 pg (ref 26.0–34.0)
MCHC: 31.5 g/dL (ref 30.0–36.0)
MCV: 85.3 fL (ref 80.0–100.0)
Monocytes Absolute: 0.1 10*3/uL (ref 0.1–1.0)
Monocytes Relative: 1 %
Neutro Abs: 8.5 10*3/uL — ABNORMAL HIGH (ref 1.7–7.7)
Neutrophils Relative %: 93 %
Platelets: 406 10*3/uL — ABNORMAL HIGH (ref 150–400)
RBC: 3.8 MIL/uL — ABNORMAL LOW (ref 3.87–5.11)
RDW: 20.2 % — ABNORMAL HIGH (ref 11.5–15.5)
WBC: 9.1 10*3/uL (ref 4.0–10.5)
nRBC: 0 % (ref 0.0–0.2)

## 2019-01-11 LAB — SARS CORONAVIRUS 2 (TAT 6-24 HRS): SARS Coronavirus 2: NEGATIVE

## 2019-01-11 LAB — PHOSPHORUS: Phosphorus: 4.3 mg/dL (ref 2.5–4.6)

## 2019-01-11 LAB — COMPREHENSIVE METABOLIC PANEL
ALT: 26 U/L (ref 0–44)
AST: 18 U/L (ref 15–41)
Albumin: 3.5 g/dL (ref 3.5–5.0)
Alkaline Phosphatase: 72 U/L (ref 38–126)
Anion gap: 8 (ref 5–15)
BUN: 17 mg/dL (ref 6–20)
CO2: 26 mmol/L (ref 22–32)
Calcium: 9 mg/dL (ref 8.9–10.3)
Chloride: 103 mmol/L (ref 98–111)
Creatinine, Ser: 0.6 mg/dL (ref 0.44–1.00)
GFR calc Af Amer: >60 mL/min (ref >60)
GFR calc non Af Amer: >60 mL/min (ref >60)
Glucose, Bld: 134 mg/dL — ABNORMAL HIGH (ref 70–99)
Potassium: 3.5 mmol/L (ref 3.5–5.1)
Sodium: 137 mmol/L (ref 135–145)
Total Bilirubin: 0.6 mg/dL (ref 0.3–1.2)
Total Protein: 6.1 g/dL — ABNORMAL LOW (ref 6.5–8.1)

## 2019-01-11 LAB — URIC ACID: Uric Acid, Serum: 2.5 mg/dL (ref 2.5–7.1)

## 2019-01-11 MED ORDER — SODIUM BICARBONATE/SODIUM CHLORIDE MOUTHWASH
Freq: Four times a day (QID) | OROMUCOSAL | Status: DC
  Administered 2019-01-11 – 2019-01-14 (×10): via OROMUCOSAL
  Filled 2019-01-11: qty 1000

## 2019-01-11 MED ORDER — SENNOSIDES-DOCUSATE SODIUM 8.6-50 MG PO TABS
2.0000 | ORAL_TABLET | Freq: Every day | ORAL | Status: DC
  Administered 2019-01-11 – 2019-01-13 (×3): 2 via ORAL
  Filled 2019-01-11 (×3): qty 2

## 2019-01-11 MED ORDER — SODIUM CHLORIDE 0.9 % IV SOLN
Freq: Once | INTRAVENOUS | Status: AC
  Administered 2019-01-11: 8 mg via INTRAVENOUS
  Filled 2019-01-11: qty 4

## 2019-01-11 MED ORDER — POTASSIUM CHLORIDE CRYS ER 20 MEQ PO TBCR
20.0000 meq | EXTENDED_RELEASE_TABLET | Freq: Every day | ORAL | Status: DC
  Administered 2019-01-11 – 2019-01-13 (×3): 20 meq via ORAL
  Filled 2019-01-11 (×3): qty 1

## 2019-01-11 MED ORDER — VINCRISTINE SULFATE CHEMO INJECTION 1 MG/ML
Freq: Once | INTRAVENOUS | Status: AC
  Administered 2019-01-11: 13:00:00 via INTRAVENOUS
  Filled 2019-01-11: qty 8

## 2019-01-11 NOTE — Progress Notes (Addendum)
HEMATOLOGY-ONCOLOGY PROGRESS NOTE  SUBJECTIVE: The patient was seen today with the assistance of a video interpreter.  The patient reports that she tolerated her first day of chemotherapy well overall.  She denies mucositis, abdominal pain, nausea, vomiting.  Denies chest discomfort, worsening shortness of breath, cough.  She remains afebrile and other vital signs are stable.  Oncology History  DLBCL (diffuse large B cell lymphoma) ([place])  10/29/2018 Imaging   CT abdomen/pelvis: IMPRESSION: Abdominopelvic lymphadenopathy, including a dominant 3.2 cm short axis jejunal mesentery nodal mass encasing the SMA, partially necrotic. Overall appearance favors lymphoma, although nodal metastases are also possible.   Spleen is normal in size. However, hypoenhancing lesions are suspected on the portal venous phase, raising the possibility of lymphomatous involvement.   Suspected subcarinal nodal mass, incompletely evaluated. Consider CT chest or PET-CT for further evaluation.   Moderate left pleural effusion. Associated left lower lobe opacity, likely atelectasis.   11/10/2018 Imaging   PET: IMPRESSION: 1. 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 2. Nodal metastasis include the lower neck, mediastinum, mesentery, peritoneum and retroperitoneum, and iliac lymph nodes. 3. Spleen and bone marrow normal. 4. Moderate LEFT pleural effusion. 5. Target lymph nodes for sampling could include the LEFT external iliac lymph node, precordial lymph node in the LEFT upper quadrant, or super clavicular/LEFT sub pectoralis nodes.   12/03/2018 Bone Marrow Biopsy   Bone Marrow, Aspirate,Biopsy, and Clot, left posterior iliac crest - NORMOCELLULAR MARROW WITH FOCAL SMALL LYMPHOID AGGREGATES. - SEE COMMENT. PERIPHERAL BLOOD: - BORDERLINE NORMOCYTIC ANEMIA. Diagnosis Note The marrow has only a few small lymphoid aggregates which are not overtly atypical and contain a  mixture of B-cells and T-cells. Flow cytometry is negative for a monoclonal B-cell population. These findings are not diagnostic of a lymphoproliferative process.  Accession: [id number] Bone Marrow Flow Cytometry - NO MONOCLONAL B-CELL OR PHENOTYPICALLY ABERRANT T-CELL POPULATION IDENTIFIED.   12/03/2018 Imaging   TTE:  1. The left ventricle has normal systolic function with an ejection fraction of 60-65%. The cavity size was normal. Left ventricular diastolic Doppler parameters are consistent with impaired relaxation.  2. The right ventricle has normal systolic function. The cavity was normal. There is no increase in right ventricular wall thickness.  3. Large pleural effusion.  4. Clinical correlation suggested.  5. The mitral valve is grossly normal.  6. The tricuspid valve is grossly normal.  7. The aortic valve is grossly normal. Aortic valve regurgitation was not assessed by color flow Doppler.  8. The aortic root is normal in size and structure.   12/08/2018 Procedure   Left thoracentesis   12/08/2018 Pathology Results   Diagnosis PLEURAL FLUID, LEFT (SPECIMEN 1 OF 1 COLLECTED 12/08/18): - B-CELL LYMPHOMA - SEE COMMENT   12/09/2018 Procedure   Left supraclavicular LN bx    12/09/2018 Pathology Results   Accession: [id number] Lymph node for lymphoma, left deep cervical - DIFFUSE LARGE B-CELL LYMPHOMA - SEE COMMENT   12/21/2018 - 01/10/2019 Chemotherapy   The patient had DOXOrubicin (ADRIAMYCIN) chemo injection 80 mg, 50 mg/m2 = 80 mg, Intravenous,  Once, 1 of 6 cycles Administration: 80 mg (12/21/2018) palonosetron (ALOXI) injection 0.25 mg, 0.25 mg, Intravenous,  Once, 1 of 6 cycles Administration: 0.25 mg (12/21/2018) pegfilgrastim-cbqv (UDENYCA) injection 6 mg, 6 mg, Subcutaneous, Once, 1 of 6 cycles Administration: 6 mg (12/23/2018) vinCRIStine (ONCOVIN) 2 mg in sodium chloride 0.9 % 50 mL chemo infusion, 2 mg, Intravenous,  Once, 1 of 6 cycles Administration: 2 mg  (12/21/2018) riTUXimab (RITUXAN) 600  mg in sodium chloride 0.9 % 250 mL (1.9355 mg/mL) infusion, 375 mg/m2 = 600 mg, Intravenous,  Once, 1 of 1 cycle Administration: 600 mg (12/21/2018) cyclophosphamide (CYTOXAN) 1,200 mg in sodium chloride 0.9 % 250 mL chemo infusion, 750 mg/m2 = 1,200 mg, Intravenous,  Once, 1 of 6 cycles Administration: 1,200 mg (12/21/2018) riTUXimab (RITUXAN) 600 mg in sodium chloride 0.9 % 190 mL infusion, 375 mg/m2 = 600 mg (100 % of original dose 375 mg/m2), Intravenous,  Once, 0 of 5 cycles Dose modification: 375 mg/m2 (original dose 375 mg/m2, Cycle 2, Reason: Provider Judgment, Comment: VO to change to RIR) fosaprepitant (EMEND) 150 mg, dexamethasone (DECADRON) 12 mg in sodium chloride 0.9 % 145 mL IVPB, , Intravenous,  Once, 1 of 6 cycles Administration:  (12/21/2018)  for chemotherapy treatment.    01/10/2019 -  Chemotherapy   The patient had DOXOrubicin (ADRIAMYCIN) 16 mg, etoposide (VEPESID) 80 mg, vinCRIStine (ONCOVIN) 0.6 mg in sodium chloride 0.9 % 1,000 mL chemo infusion, , Intravenous, Once, 1 of 4 cycles Administration:  (01/10/2019) ondansetron (ZOFRAN) 8 mg, dexamethasone (DECADRON) 10 mg in sodium chloride 0.9 % 50 mL IVPB, , Intravenous,  Once, 1 of 4 cycles Administration: 18 mg (01/10/2019) cyclophosphamide (CYTOXAN) 1,180 mg in sodium chloride 0.9 % 250 mL chemo infusion, 750 mg/m2 = 1,180 mg, Intravenous,  Once, 1 of 4 cycles  for chemotherapy treatment.    01/17/2019 -  Chemotherapy   The patient had pegfilgrastim-cbqv (UDENYCA) injection 6 mg, 6 mg, Subcutaneous, Once, 0 of 4 cycles riTUXimab (RITUXAN) 600 mg in sodium chloride 0.9 % 250 mL (1.9355 mg/mL) infusion, 375 mg/m2 = 600 mg, Intravenous,  Once, 0 of 4 cycles  for chemotherapy treatment.       REVIEW OF SYSTEMS:   Constitutional: Denies fevers, chills Ears, nose, mouth, throat, and face: Denies mucositis or sore throat Respiratory: Denies cough, dyspnea or wheezes Cardiovascular: Denies  palpitation, chest discomfort Gastrointestinal:  Denies nausea, heartburn or change in bowel habits Skin: Denies abnormal skin rashes Lymphatics: Denies new lymphadenopathy or easy bruising Neurological:Denies numbness, tingling or new weaknesses Behavioral/Psych: Mood is stable, no new changes  Extremities: No lower extremity edema All other systems were reviewed with the patient and are negative.  I have reviewed the past medical history, past surgical history, social history and family history with the patient and they are unchanged from previous note.   PHYSICAL EXAMINATION: ECOG PERFORMANCE STATUS: 0 - Asymptomatic  Vitals:   01/10/19 1945 01/11/19 0533  BP: 127/80 133/86  Pulse: 85 69  Resp: 17 17  Temp: 98.7 F (37.1 C) 97.9 F (36.6 C)  SpO2:  99%   Filed Weights   01/10/19 1735  Weight: 132 lb 1.6 oz (59.9 kg)    Intake/Output from previous day: 08/24 0701 - 08/25 0700 In: 1374.9 [P.O.:720; I.V.:115.1; IV Piggyback:539.7] Out: -   GENERAL:alert, no distress and comfortable SKIN: skin color, texture, turgor are normal, no rashes or significant lesions EYES: normal, Conjunctiva are pink and non-injected, sclera clear OROPHARYNX:no exudate, no erythema and lips, buccal mucosa, and tongue normal  NECK: supple, thyroid normal size, non-tender, without nodularity LYMPH:  no palpable lymphadenopathy in the cervical, axillary or inguinal LUNGS: Diminished left base, otherwise clear. HEART: regular rate & rhythm and no murmurs and no lower extremity edema ABDOMEN:abdomen soft, non-tender and normal bowel sounds Musculoskeletal:no cyanosis of digits and no clubbing  NEURO: alert & oriented x 3 with fluent speech, no focal motor/sensory deficits  LABORATORY DATA:  I have  reviewed the data as listed CMP Latest Ref Rng & Units 01/11/2019 01/10/2019 01/04/2019  Glucose 70 - 99 mg/dL 134(H) 86 92  BUN 6 - 20 mg/dL 17 14 7   Creatinine 0.44 - 1.00 mg/dL 0.60 0.64 0.67   Sodium 135 - 145 mmol/L 137 134(L) 132(L)  Potassium 3.5 - 5.1 mmol/L 3.5 3.6 4.1  Chloride 98 - 111 mmol/L 103 98 94(L)  CO2 22 - 32 mmol/L 26 25 28   Calcium 8.9 - 10.3 mg/dL 9.0 8.6(L) 8.5(L)  Total Protein 6.5 - 8.1 g/dL 6.1(L) 6.0(L) 6.0(L)  Total Bilirubin 0.3 - 1.2 mg/dL 0.6 0.5 0.2(L)  Alkaline Phos 38 - 126 U/L 72 68 102  AST 15 - 41 U/L 18 21 16   ALT 0 - 44 U/L 26 27 23     Lab Results  Component Value Date   WBC 9.1 01/11/2019   HGB 10.2 (L) 01/11/2019   HCT 32.4 (L) 01/11/2019   MCV 85.3 01/11/2019   PLT 406 (H) 01/11/2019   NEUTROABS 8.5 (H) 01/11/2019    Dg Chest 1 View  Result Date: 12/24/2018 CLINICAL DATA:  Status post thoracentesis EXAM: CHEST  1 VIEW COMPARISON:  December 21, 2018 FINDINGS: No pneumothorax evident. Left pleural effusion is much smaller currently following thoracentesis. Small residual pleural effusion remains with left base atelectasis. Lungs elsewhere are clear. Heart is slightly enlarged with pulmonary vascularity normal. There is aortic atherosclerosis. Port-A-Cath tip is in the superior vena cava. There is mild degenerative change in each shoulder. IMPRESSION: No evident pneumothorax. Small residual left pleural effusion with left base atelectasis. Lungs elsewhere clear. Heart mildly enlarged. Aortic Atherosclerosis (ICD10-I70.0). Electronically Signed   By: Lowella Grip III M.D.   On: 12/24/2018 11:44   Dg Chest 2 View  Result Date: 12/22/2018 CLINICAL DATA:  Shortness of breath EXAM: CHEST - 2 VIEW COMPARISON:  12/08/2018 FINDINGS: Right Port-A-Cath remains in place, unchanged. Large left pleural effusion, enlarging since prior study. Left lower lobe atelectasis. Small right effusion with right base atelectasis. Heart is normal size. No acute bony abnormality. IMPRESSION: Large left pleural effusion and small right pleural effusion. Bibasilar atelectasis. Electronically Signed   By: Rolm Baptise M.D.   On: 12/22/2018 00:31   Dg Chest Port 1  View  Result Date: 12/28/2018 CLINICAL DATA:  Shortness of breath EXAM: PORTABLE CHEST 1 VIEW COMPARISON:  Four days ago FINDINGS: Small left pleural effusion. There is presumed adjacent atelectasis. No edema or air bronchogram. Normal heart size and mediastinal contours. Right IJ catheter with tip at the upper SVC. IMPRESSION: 1. Stable from study 4 days ago. 2. Small left pleural effusion. Electronically Signed   By: Monte Fantasia M.D.   On: 12/28/2018 04:53   US Thoracentesis Asp Pleural Space W/img Guide  Result Date: 12/24/2018 INDICATION: Patient with history of lymphoma, recurrent pleural effusion. Request for therapeutic thoracentesis today in IR. EXAM: ULTRASOUND GUIDED LEFT THORACENTESIS MEDICATIONS: 10 mL 1% lidocaine. COMPLICATIONS: None immediate. PROCEDURE: An ultrasound guided thoracentesis was thoroughly discussed with the patient and questions answered. The benefits, risks, alternatives and complications were also discussed. The patient understands and wishes to proceed with the procedure. Written consent was obtained. Ultrasound was performed to localize and mark an adequate pocket of fluid in the left chest. The area was then prepped and draped in the normal sterile fashion. 1% Lidocaine was used for local anesthesia. Under ultrasound guidance a 6 Fr Safe-T-Centesis catheter was introduced. Thoracentesis was performed. The catheter was removed and a dressing applied.  FINDINGS: A total of approximately 1.6 L of cloudy yellow fluid was removed. IMPRESSION: Successful ultrasound guided left thoracentesis yielding 1.6 L of pleural fluid. Read by Candiss Norse, PA-C Electronically Signed   By: Corrie Mckusick D.O.   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 The intent is for cure. -We discussed some of the risks, benefitsandside-effects of Rituximab,Etoposide, Vincristine, Adriamycin,Cytoxan,Prednisone.The regimen requires inpatient admission due to continuous chemotherapy infusion  -Some of the short term side-effects included, though not limited to, risk of fatigue, weight loss,tumor lysis syndrome, risk of allergic reactions,pancytopenia, life-threatening infections, need for transfusions of blood products, nausea, vomiting, change in bowel habits,hairloss, risk of congestive heart failure, admission to hospital for various reasons, and risks of death.  -Long term side-effects are also discussed includingpermanent damage to nerve function, chronic fatigue, and rare secondary malignancy including bone marrow disorders. -The patient is aware that the response rates discussed earlier is not guaranteed. -After a long discussion, patient made an informed decision to proceed with the prescribed plan of care. -She tolerated day 1 of cycle 1 of her chemotherapy well overall.  Labs have been reviewed.  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 -S/p thoracentesis x 2 -Recent CXR showed improvement in left pleural effusion -Clinically, patient denies any recurrent dyspnea; no significant effusion on exam -We will monitor it for now   Normocytic anemia -Secondary to anemia of chronic disease  -Hgb  is 10.2 today which is stable -Patient denies any symptoms of bleeding -We will monitor it for now; continue daily CBC with  differential  Leukocytosis -Most likely secondary to G-CSF -WBChas normalized -We will monitor it for now  Hyponatremia -Sodium level has normalized -Continue Pedialyte and gentle salt addition to the diet, and maintain adequate hydration -We will monitor it closely  Hypokalemia -Potassium is down slightly to 3.5 today -Has been on potassium chloride 20 mEq daily as an outpatient, but not currently taking -We will resume potassium chloride 20 mEq daily -Check CMET daily and adjust potassium as needed  Chemotherapy-associated nausea  -Secondary to chemotherapy -Symptoms relatively well controlled  -Continue PRN-anti-emetics  All questions were answered. The patient knows to call the clinic with any problems, questions or concerns. No barriers to learning was detected.  EPOCH scheduled on 01/10/2019. Rituximab and Udenyca on 01/17/2019. Return to clinic for labs, port flush and clinic appt with Dr. Irene Limbo on 01/24/2019.    LOS: 1 day   Mikey Bussing, DNP, AGPCNP-BC, AOCNP 01/11/19  ADDENDUM  .Patient was Personally and independently interviewed, examined and relevant elements of the history of present illness were reviewed in details and an assessment and plan was created. All elements of the patient's history of present illness , assessment and plan were discussed in details with Mikey Bussing, DNP. The above documentation reflects our combined findings assessment and plan.  Sullivan Lone MD MS

## 2019-01-12 LAB — COMPREHENSIVE METABOLIC PANEL
ALT: 23 U/L (ref 0–44)
AST: 17 U/L (ref 15–41)
Albumin: 3.4 g/dL — ABNORMAL LOW (ref 3.5–5.0)
Alkaline Phosphatase: 62 U/L (ref 38–126)
Anion gap: 7 (ref 5–15)
BUN: 18 mg/dL (ref 6–20)
CO2: 24 mmol/L (ref 22–32)
Calcium: 8.6 mg/dL — ABNORMAL LOW (ref 8.9–10.3)
Chloride: 105 mmol/L (ref 98–111)
Creatinine, Ser: 0.56 mg/dL (ref 0.44–1.00)
GFR calc Af Amer: >60 mL/min (ref >60)
GFR calc non Af Amer: >60 mL/min (ref >60)
Glucose, Bld: 120 mg/dL — ABNORMAL HIGH (ref 70–99)
Potassium: 3.6 mmol/L (ref 3.5–5.1)
Sodium: 136 mmol/L (ref 135–145)
Total Bilirubin: 0.5 mg/dL (ref 0.3–1.2)
Total Protein: 5.9 g/dL — ABNORMAL LOW (ref 6.5–8.1)

## 2019-01-12 LAB — CBC WITH DIFFERENTIAL/PLATELET
Abs Immature Granulocytes: 0.1 10*3/uL — ABNORMAL HIGH (ref 0.00–0.07)
Basophils Absolute: 0 10*3/uL (ref 0.0–0.1)
Basophils Relative: 0 %
Eosinophils Absolute: 0 10*3/uL (ref 0.0–0.5)
Eosinophils Relative: 0 %
HCT: 31.4 % — ABNORMAL LOW (ref 36.0–46.0)
Hemoglobin: 9.8 g/dL — ABNORMAL LOW (ref 12.0–15.0)
Immature Granulocytes: 1 %
Lymphocytes Relative: 4 %
Lymphs Abs: 0.4 10*3/uL — ABNORMAL LOW (ref 0.7–4.0)
MCH: 26.8 pg (ref 26.0–34.0)
MCHC: 31.2 g/dL (ref 30.0–36.0)
MCV: 85.8 fL (ref 80.0–100.0)
Monocytes Absolute: 0.3 10*3/uL (ref 0.1–1.0)
Monocytes Relative: 3 %
Neutro Abs: 10.7 10*3/uL — ABNORMAL HIGH (ref 1.7–7.7)
Neutrophils Relative %: 92 %
Platelets: 401 10*3/uL — ABNORMAL HIGH (ref 150–400)
RBC: 3.66 MIL/uL — ABNORMAL LOW (ref 3.87–5.11)
RDW: 20.4 % — ABNORMAL HIGH (ref 11.5–15.5)
WBC: 11.5 10*3/uL — ABNORMAL HIGH (ref 4.0–10.5)
nRBC: 0 % (ref 0.0–0.2)

## 2019-01-12 LAB — MAGNESIUM: Magnesium: 2.1 mg/dL (ref 1.7–2.4)

## 2019-01-12 LAB — PHOSPHORUS: Phosphorus: 3.8 mg/dL (ref 2.5–4.6)

## 2019-01-12 LAB — URIC ACID: Uric Acid, Serum: 3.2 mg/dL (ref 2.5–7.1)

## 2019-01-12 MED ORDER — VINCRISTINE SULFATE CHEMO INJECTION 1 MG/ML
Freq: Once | INTRAVENOUS | Status: AC
  Administered 2019-01-12: 12:00:00 via INTRAVENOUS
  Filled 2019-01-12: qty 8

## 2019-01-12 MED ORDER — SODIUM CHLORIDE 0.9 % IV SOLN
Freq: Once | INTRAVENOUS | Status: AC
  Administered 2019-01-12: 18 mg via INTRAVENOUS
  Filled 2019-01-12: qty 4

## 2019-01-12 NOTE — Progress Notes (Addendum)
HEMATOLOGY-ONCOLOGY PROGRESS NOTE  SUBJECTIVE: The patient was seen today with the assistance of a video interpreter.  The patient continues to tolerate her chemotherapy well overall.  Bowels moved yesterday.  She denies chest pain, shortness of breath, cough.  Denies nausea vomiting.  Denies mucositis.  Oncology History  DLBCL (diffuse large B cell lymphoma) ([place])  10/29/2018 Imaging   CT abdomen/pelvis: IMPRESSION: Abdominopelvic lymphadenopathy, including a dominant 3.2 cm short axis jejunal mesentery nodal mass encasing the SMA, partially necrotic. Overall appearance favors lymphoma, although nodal metastases are also possible.   Spleen is normal in size. However, hypoenhancing lesions are suspected on the portal venous phase, raising the possibility of lymphomatous involvement.   Suspected subcarinal nodal mass, incompletely evaluated. Consider CT chest or PET-CT for further evaluation.   Moderate left pleural effusion. Associated left lower lobe opacity, likely atelectasis.   11/10/2018 Imaging   PET: IMPRESSION: 1. 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 2. Nodal metastasis include the lower neck, mediastinum, mesentery, peritoneum and retroperitoneum, and iliac lymph nodes. 3. Spleen and bone marrow normal. 4. Moderate LEFT pleural effusion. 5. Target lymph nodes for sampling could include the LEFT external iliac lymph node, precordial lymph node in the LEFT upper quadrant, or super clavicular/LEFT sub pectoralis nodes.   12/03/2018 Bone Marrow Biopsy   Bone Marrow, Aspirate,Biopsy, and Clot, left posterior iliac crest - NORMOCELLULAR MARROW WITH FOCAL SMALL LYMPHOID AGGREGATES. - SEE COMMENT. PERIPHERAL BLOOD: - BORDERLINE NORMOCYTIC ANEMIA. Diagnosis Note The marrow has only a few small lymphoid aggregates which are not overtly atypical and contain a mixture of B-cells and T-cells. Flow cytometry is negative for a  monoclonal B-cell population. These findings are not diagnostic of a lymphoproliferative process.  Accession: [id number] Bone Marrow Flow Cytometry - NO MONOCLONAL B-CELL OR PHENOTYPICALLY ABERRANT T-CELL POPULATION IDENTIFIED.   12/03/2018 Imaging   TTE:  1. The left ventricle has normal systolic function with an ejection fraction of 60-65%. The cavity size was normal. Left ventricular diastolic Doppler parameters are consistent with impaired relaxation.  2. The right ventricle has normal systolic function. The cavity was normal. There is no increase in right ventricular wall thickness.  3. Large pleural effusion.  4. Clinical correlation suggested.  5. The mitral valve is grossly normal.  6. The tricuspid valve is grossly normal.  7. The aortic valve is grossly normal. Aortic valve regurgitation was not assessed by color flow Doppler.  8. The aortic root is normal in size and structure.   12/08/2018 Procedure   Left thoracentesis   12/08/2018 Pathology Results   Diagnosis PLEURAL FLUID, LEFT (SPECIMEN 1 OF 1 COLLECTED 12/08/18): - B-CELL LYMPHOMA - SEE COMMENT   12/09/2018 Procedure   Left supraclavicular LN bx    12/09/2018 Pathology Results   Accession: [id number] Lymph node for lymphoma, left deep cervical - DIFFUSE LARGE B-CELL LYMPHOMA - SEE COMMENT   12/21/2018 - 01/10/2019 Chemotherapy   The patient had DOXOrubicin (ADRIAMYCIN) chemo injection 80 mg, 50 mg/m2 = 80 mg, Intravenous,  Once, 1 of 6 cycles Administration: 80 mg (12/21/2018) palonosetron (ALOXI) injection 0.25 mg, 0.25 mg, Intravenous,  Once, 1 of 6 cycles Administration: 0.25 mg (12/21/2018) pegfilgrastim-cbqv (UDENYCA) injection 6 mg, 6 mg, Subcutaneous, Once, 1 of 6 cycles Administration: 6 mg (12/23/2018) vinCRIStine (ONCOVIN) 2 mg in sodium chloride 0.9 % 50 mL chemo infusion, 2 mg, Intravenous,  Once, 1 of 6 cycles Administration: 2 mg (12/21/2018) riTUXimab (RITUXAN) 600 mg in sodium chloride 0.9 % 250 mL (1.9355  mg/mL) infusion,  375 mg/m2 = 600 mg, Intravenous,  Once, 1 of 1 cycle Administration: 600 mg (12/21/2018) cyclophosphamide (CYTOXAN) 1,200 mg in sodium chloride 0.9 % 250 mL chemo infusion, 750 mg/m2 = 1,200 mg, Intravenous,  Once, 1 of 6 cycles Administration: 1,200 mg (12/21/2018) riTUXimab (RITUXAN) 600 mg in sodium chloride 0.9 % 190 mL infusion, 375 mg/m2 = 600 mg (100 % of original dose 375 mg/m2), Intravenous,  Once, 0 of 5 cycles Dose modification: 375 mg/m2 (original dose 375 mg/m2, Cycle 2, Reason: Provider Judgment, Comment: VO to change to RIR) fosaprepitant (EMEND) 150 mg, dexamethasone (DECADRON) 12 mg in sodium chloride 0.9 % 145 mL IVPB, , Intravenous,  Once, 1 of 6 cycles Administration:  (12/21/2018)  for chemotherapy treatment.    01/10/2019 -  Chemotherapy   The patient had DOXOrubicin (ADRIAMYCIN) 16 mg, etoposide (VEPESID) 80 mg, vinCRIStine (ONCOVIN) 0.6 mg in sodium chloride 0.9 % 1,000 mL chemo infusion, , Intravenous, Once, 1 of 4 cycles Administration:  (01/10/2019),  (01/11/2019) ondansetron (ZOFRAN) 8 mg, dexamethasone (DECADRON) 10 mg in sodium chloride 0.9 % 50 mL IVPB, , Intravenous,  Once, 1 of 4 cycles Administration: 18 mg (01/10/2019), 8 mg (01/11/2019) cyclophosphamide (CYTOXAN) 1,180 mg in sodium chloride 0.9 % 250 mL chemo infusion, 750 mg/m2 = 1,180 mg, Intravenous,  Once, 1 of 4 cycles  for chemotherapy treatment.    01/17/2019 -  Chemotherapy   The patient had pegfilgrastim-cbqv (UDENYCA) injection 6 mg, 6 mg, Subcutaneous, Once, 0 of 4 cycles riTUXimab (RITUXAN) 600 mg in sodium chloride 0.9 % 250 mL (1.9355 mg/mL) infusion, 375 mg/m2 = 600 mg, Intravenous,  Once, 0 of 4 cycles  for chemotherapy treatment.       REVIEW OF SYSTEMS:   Constitutional: Denies fevers, chills Ears, nose, mouth, throat, and face: Denies mucositis or sore throat Respiratory: Denies cough, dyspnea or wheezes Cardiovascular: Denies palpitation, chest discomfort Gastrointestinal:   Denies nausea, heartburn or change in bowel habits Skin: Denies abnormal skin rashes Lymphatics: Denies new lymphadenopathy or easy bruising Neurological:Denies numbness, tingling or new weaknesses Behavioral/Psych: Mood is stable, no new changes  Extremities: No lower extremity edema All other systems were reviewed with the patient and are negative.  I have reviewed the past medical history, past surgical history, social history and family history with the patient and they are unchanged from previous note.   PHYSICAL EXAMINATION: ECOG PERFORMANCE STATUS: 0 - Asymptomatic  Vitals:   01/11/19 2156 01/12/19 0459  BP: 128/77 123/81  Pulse: 67 65  Resp: 17 17  Temp: 98.1 F (36.7 C) 100.2 F (37.9 C)  SpO2: 95% 97%   Filed Weights   01/10/19 1735  Weight: 132 lb 1.6 oz (59.9 kg)    Intake/Output from previous day: 08/25 0701 - 08/26 0700 In: 938.2 [I.V.:246.7; IV Piggyback:691.5] Out: -   GENERAL:alert, no distress and comfortable SKIN: skin color, texture, turgor are normal, no rashes or significant lesions EYES: normal, Conjunctiva are pink and non-injected, sclera clear OROPHARYNX:no exudate, no erythema and lips, buccal mucosa, and tongue normal  NECK: supple, thyroid normal size, non-tender, without nodularity LYMPH:  no palpable lymphadenopathy in the cervical, axillary or inguinal LUNGS: Diminished left base, otherwise clear. HEART: regular rate & rhythm and no murmurs and no lower extremity edema ABDOMEN:abdomen soft, non-tender and normal bowel sounds Musculoskeletal:no cyanosis of digits and no clubbing  NEURO: alert & oriented x 3 with fluent speech, no focal motor/sensory deficits  LABORATORY DATA:  I have reviewed the data as listed CMP Latest  Ref Rng & Units 01/12/2019 01/11/2019 01/10/2019  Glucose 70 - 99 mg/dL 120(H) 134(H) 86  BUN 6 - 20 mg/dL _0 Creatinine 0.44 - 1.00 mg/dL 0.56 0.60 0.64  Sodium 135 - 145 mmol/L 136 137 134(L)  Potassium 3.5 -  5.1 mmol/L 3.6 3.5 3.6  Chloride 98 - 111 mmol/L 105 103 98  CO2 22 - 32 mmol/L _1 Calcium 8.9 - 10.3 mg/dL 8.6(L) 9.0 8.6(L)  Total Protein 6.5 - 8.1 g/dL 5.9(L) 6.1(L) 6.0(L)  Total Bilirubin 0.3 - 1.2 mg/dL 0.5 0.6 0.5  Alkaline Phos 38 - 126 U/L 62 72 68  AST 15 - 41 U/L _2 ALT 0 - 44 U/L _3 Lab Results  Component Value Date   WBC 11.5 (H) 01/12/2019   HGB 9.8 (L) 01/12/2019   HCT 31.4 (L) 01/12/2019   MCV 85.8 01/12/2019   PLT 401 (H) 01/12/2019   NEUTROABS 10.7 (H) 01/12/2019    Dg Chest 1 View  Result Date: 12/24/2018 CLINICAL DATA:  Status post thoracentesis EXAM: CHEST  1 VIEW COMPARISON:  December 21, 2018 FINDINGS: No pneumothorax evident. Left pleural effusion is much smaller currently following thoracentesis. Small residual pleural effusion remains with left base atelectasis. Lungs elsewhere are clear. Heart is slightly enlarged with pulmonary vascularity normal. There is aortic atherosclerosis. Port-A-Cath tip is in the superior vena cava. There is mild degenerative change in each shoulder. IMPRESSION: No evident pneumothorax. Small residual left pleural effusion with left base atelectasis. Lungs elsewhere clear. Heart mildly enlarged. Aortic Atherosclerosis (ICD10-I70.0). Electronically Signed   By: Lowella Grip III M.D.   On: 12/24/2018 11:44   Dg Chest 2 View  Result Date: 12/22/2018 CLINICAL DATA:  Shortness of breath EXAM: CHEST - 2 VIEW COMPARISON:  12/08/2018 FINDINGS: Right Port-A-Cath remains in place, unchanged. Large left pleural effusion, enlarging since prior study. Left lower lobe atelectasis. Small right effusion with right base atelectasis. Heart is normal size. No acute bony abnormality. IMPRESSION: Large left pleural effusion and small right pleural effusion. Bibasilar atelectasis. Electronically Signed   By: Rolm Baptise M.D.   On: 12/22/2018 00:31   Dg Chest Port 1 View  Result Date: 12/28/2018 CLINICAL DATA:  Shortness of breath  EXAM: PORTABLE CHEST 1 VIEW COMPARISON:  Four days ago FINDINGS: Small left pleural effusion. There is presumed adjacent atelectasis. No edema or air bronchogram. Normal heart size and mediastinal contours. Right IJ catheter with tip at the upper SVC. IMPRESSION: 1. Stable from study 4 days ago. 2. Small left pleural effusion. Electronically Signed   By: Monte Fantasia M.D.   On: 12/28/2018 04:53   US Thoracentesis Asp Pleural Space W/img Guide  Result Date: 12/24/2018 INDICATION: Patient with history of lymphoma, recurrent pleural effusion. Request for therapeutic thoracentesis today in IR. EXAM: ULTRASOUND GUIDED LEFT THORACENTESIS MEDICATIONS: 10 mL 1% lidocaine. COMPLICATIONS: None immediate. PROCEDURE: An ultrasound guided thoracentesis was thoroughly discussed with the patient and questions answered. The benefits, risks, alternatives and complications were also discussed. The patient understands and wishes to proceed with the procedure. Written consent was obtained. Ultrasound was performed to localize and mark an adequate pocket of fluid in the left chest. The area was then prepped and draped in the normal sterile fashion. 1% Lidocaine was used for local anesthesia. Under ultrasound guidance a 6 Fr Safe-T-Centesis catheter was introduced. Thoracentesis was performed. The catheter was removed and a dressing applied. FINDINGS: A total of approximately 1.6  L of cloudy yellow fluid was removed. IMPRESSION: Successful ultrasound guided left thoracentesis yielding 1.6 L of pleural fluid. Read by Candiss Norse, PA-C Electronically Signed   By: Corrie Mckusick D.O.   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 The intent is for cure. -We discussed some of the risks, benefitsandside-effects of Rituximab,Etoposide, Vincristine, Adriamycin,Cytoxan,Prednisone.The regimen requires inpatient admission due to continuous chemotherapy infusion  -Some of the short term side-effects included, though not limited to, risk of fatigue, weight loss,tumor lysis syndrome, risk of allergic reactions,pancytopenia, life-threatening infections, need for transfusions of blood products, nausea, vomiting, change in bowel habits,hairloss, risk of congestive heart failure, admission to hospital for various reasons, and risks of death.  -Long term side-effects are also discussed includingpermanent damage to nerve function, chronic fatigue, and rare secondary malignancy including bone marrow disorders. -The patient is aware that the response rates discussed earlier is not guaranteed. -After a long discussion, patient made an informed decision to proceed with the prescribed plan of care. -She tolerated day 2 of cycle 1 of her chemotherapy well overall.  Labs have been reviewed.  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 -S/p thoracentesis x 2 -Recent CXR showed improvement in left pleural effusion -Clinically, patient denies any recurrent dyspnea; no significant effusion on exam -We will monitor it for now   Normocytic anemia -Secondary to anemia of chronic disease  -Hgb is 9.8 today which is stable -Patient denies any symptoms of bleeding -We will monitor it for now; continue daily CBC with differential  Leukocytosis -Most likely secondary to G-CSF -White blood cell count is elevated  at 11.5 likely due to steroids -We will monitor it for now  Hyponatremia -Sodium level has normalized -Continue Pedialyte and gentle salt addition to the diet, and maintain adequate hydration -We will monitor it closely  Hypokalemia -Potassium stable at 3.6 -Continue potassium chloride 20 mEq daily -Check CMET daily and adjust potassium as needed  Chemotherapy-associated nausea  -Secondary to chemotherapy -Symptoms relatively well controlled  -Continue PRN-anti-emetics  The patient had many questions today regarding work.  She states that she needs additional information sent to her employer, but is unsure what they need.  I was given a telephone number to [place] [facility], [phone], and was given permission by the patient to speak to her regarding any additional documentation.  I left a voicemail for [employer] with my return telephone number.  I also called the patient's daughter, Yasmin, and notified her of above.  She will also assist [place] in trying to determine what the patient needs with regards to her work..  All questions were answered. The patient knows to call the clinic with any problems, questions or concerns. No barriers to learning was detected.  EPOCH scheduled on 01/10/2019. Rituximab and Udenyca on 01/17/2019. Return to clinic for labs, port flush and clinic appt with Dr. Irene Limbo on 01/24/2019.    LOS: 2 days   Mikey Bussing,  DNP, AGPCNP-BC, AOCNP 01/12/19   ADDENDUM  .Patient was Personally and independently interviewed, examined and relevant elements of the history of present illness were reviewed in details and an assessment and plan was created. All elements of the patient's history of present illness , assessment and plan were discussed in details with Mikey Bussing, DNP. The above documentation reflects our combined findings assessment and plan.  Sullivan Lone MD MS

## 2019-01-13 ENCOUNTER — Ambulatory Visit: Payer: Commercial Managed Care - PPO

## 2019-01-13 LAB — CBC WITH DIFFERENTIAL/PLATELET
Abs Immature Granulocytes: 0.03 10*3/uL (ref 0.00–0.07)
Basophils Absolute: 0 10*3/uL (ref 0.0–0.1)
Basophils Relative: 0 %
Eosinophils Absolute: 0 10*3/uL (ref 0.0–0.5)
Eosinophils Relative: 0 %
HCT: 32 % — ABNORMAL LOW (ref 36.0–46.0)
Hemoglobin: 10.2 g/dL — ABNORMAL LOW (ref 12.0–15.0)
Immature Granulocytes: 1 %
Lymphocytes Relative: 9 %
Lymphs Abs: 0.5 10*3/uL — ABNORMAL LOW (ref 0.7–4.0)
MCH: 27.3 pg (ref 26.0–34.0)
MCHC: 31.9 g/dL (ref 30.0–36.0)
MCV: 85.6 fL (ref 80.0–100.0)
Monocytes Absolute: 0.5 10*3/uL (ref 0.1–1.0)
Monocytes Relative: 8 %
Neutro Abs: 5.1 10*3/uL (ref 1.7–7.7)
Neutrophils Relative %: 82 %
Platelets: 359 10*3/uL (ref 150–400)
RBC: 3.74 MIL/uL — ABNORMAL LOW (ref 3.87–5.11)
RDW: 20.7 % — ABNORMAL HIGH (ref 11.5–15.5)
WBC: 6.2 10*3/uL (ref 4.0–10.5)
nRBC: 0 % (ref 0.0–0.2)

## 2019-01-13 LAB — MAGNESIUM: Magnesium: 2.2 mg/dL (ref 1.7–2.4)

## 2019-01-13 LAB — COMPREHENSIVE METABOLIC PANEL
ALT: 24 U/L (ref 0–44)
AST: 18 U/L (ref 15–41)
Albumin: 3.2 g/dL — ABNORMAL LOW (ref 3.5–5.0)
Alkaline Phosphatase: 61 U/L (ref 38–126)
Anion gap: 9 (ref 5–15)
BUN: 17 mg/dL (ref 6–20)
CO2: 21 mmol/L — ABNORMAL LOW (ref 22–32)
Calcium: 8.5 mg/dL — ABNORMAL LOW (ref 8.9–10.3)
Chloride: 106 mmol/L (ref 98–111)
Creatinine, Ser: 0.62 mg/dL (ref 0.44–1.00)
GFR calc Af Amer: >60 mL/min (ref >60)
GFR calc non Af Amer: >60 mL/min (ref >60)
Glucose, Bld: 93 mg/dL (ref 70–99)
Potassium: 3.3 mmol/L — ABNORMAL LOW (ref 3.5–5.1)
Sodium: 136 mmol/L (ref 135–145)
Total Bilirubin: 0.5 mg/dL (ref 0.3–1.2)
Total Protein: 5.5 g/dL — ABNORMAL LOW (ref 6.5–8.1)

## 2019-01-13 LAB — URIC ACID: Uric Acid, Serum: 4 mg/dL (ref 2.5–7.1)

## 2019-01-13 LAB — PHOSPHORUS: Phosphorus: 3.5 mg/dL (ref 2.5–4.6)

## 2019-01-13 MED ORDER — SODIUM CHLORIDE 0.9 % IV SOLN
750.0000 mg/m2 | Freq: Once | INTRAVENOUS | Status: AC
  Administered 2019-01-14: 11:00:00 1180 mg via INTRAVENOUS
  Filled 2019-01-13: qty 59

## 2019-01-13 MED ORDER — VINCRISTINE SULFATE CHEMO INJECTION 1 MG/ML
Freq: Once | INTRAVENOUS | Status: AC
  Administered 2019-01-13: 11:00:00 via INTRAVENOUS
  Filled 2019-01-13: qty 8

## 2019-01-13 MED ORDER — POTASSIUM CHLORIDE CRYS ER 20 MEQ PO TBCR
20.0000 meq | EXTENDED_RELEASE_TABLET | Freq: Two times a day (BID) | ORAL | Status: DC
  Administered 2019-01-13 – 2019-01-14 (×2): 20 meq via ORAL
  Filled 2019-01-13 (×2): qty 1

## 2019-01-13 MED ORDER — SODIUM CHLORIDE 0.9 % IV SOLN
Freq: Once | INTRAVENOUS | Status: AC
  Administered 2019-01-14: 10:00:00 36 mg via INTRAVENOUS
  Filled 2019-01-13: qty 8

## 2019-01-13 MED ORDER — PROCHLORPERAZINE EDISYLATE 10 MG/2ML IJ SOLN: 10.0000 mg | Freq: Four times a day (QID) | INTRAMUSCULAR | Status: DC | PRN

## 2019-01-13 MED ORDER — SODIUM CHLORIDE 0.9 % IV SOLN
Freq: Once | INTRAVENOUS | Status: AC
  Administered 2019-01-13: 09:00:00 18 mg via INTRAVENOUS
  Filled 2019-01-13: qty 4

## 2019-01-13 NOTE — Progress Notes (Signed)
Rapid Infusion Rituximab Pharmacist Evaluation  Patients may be eligible for Rapid Infusion Rituximab (RIR) if they have no significant cardiac disease, no risk for Tumor Lysis Syndrome (TLS), received rituximab within the last 6 months, and tolerated those infusions per standard protocol without grade 3-4 infusion reactions. A pharmacist has verified the patient tolerated rituximab infusions per the [facility] standard infusion protocol without grade 3-4 infusion reactions. The treatment plan will be updated to reflect RIR if the patient qualifies per the checklist below.   Denise Walls is a 58 y.o. female being treated with rituximab for DLBCL. This patient may be considered for RIR.    Age > 49 years old Yes   Stable renal, hepatic, and hematologic function Yes   Recent Pertinent Lab Values  Lab Results  Component Value Date   CREATININE 0.62 01/13/2019   BILITOT 0.5 01/13/2019   Lab Results  Component Value Date   WBC 6.2 01/13/2019   LYMPHSABS 0.5 (L) 01/13/2019   PLT 359 01/13/2019     Prior documented reaction to rituximab No   Previous rituximab infusion within 6 months No   Physician approval of RIR Will f/u after 1st inf  Treatment Plan updated orders to reflect RIR Will f/u after 1st inf    Denise Walls 01/13/19 8:30 AM

## 2019-01-13 NOTE — Progress Notes (Addendum)
HEMATOLOGY-ONCOLOGY PROGRESS NOTE  SUBJECTIVE: The patient was seen today with the assistance of a video interpreter.  Reports increased fatigue and mild nausea this morning.  She has not had any vomiting.  Bowels are moving well.  No chest pain or shortness of breath.  Oncology History  DLBCL (diffuse large B cell lymphoma) ([place])  10/29/2018 Imaging   CT abdomen/pelvis: IMPRESSION: Abdominopelvic lymphadenopathy, including a dominant 3.2 cm short axis jejunal mesentery nodal mass encasing the SMA, partially necrotic. Overall appearance favors lymphoma, although nodal metastases are also possible.   Spleen is normal in size. However, hypoenhancing lesions are suspected on the portal venous phase, raising the possibility of lymphomatous involvement.   Suspected subcarinal nodal mass, incompletely evaluated. Consider CT chest or PET-CT for further evaluation.   Moderate left pleural effusion. Associated left lower lobe opacity, likely atelectasis.   11/10/2018 Imaging   PET: IMPRESSION: 1. 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 2. Nodal metastasis include the lower neck, mediastinum, mesentery, peritoneum and retroperitoneum, and iliac lymph nodes. 3. Spleen and bone marrow normal. 4. Moderate LEFT pleural effusion. 5. Target lymph nodes for sampling could include the LEFT external iliac lymph node, precordial lymph node in the LEFT upper quadrant, or super clavicular/LEFT sub pectoralis nodes.   12/03/2018 Bone Marrow Biopsy   Bone Marrow, Aspirate,Biopsy, and Clot, left posterior iliac crest - NORMOCELLULAR MARROW WITH FOCAL SMALL LYMPHOID AGGREGATES. - SEE COMMENT. PERIPHERAL BLOOD: - BORDERLINE NORMOCYTIC ANEMIA. Diagnosis Note The marrow has only a few small lymphoid aggregates which are not overtly atypical and contain a mixture of B-cells and T-cells. Flow cytometry is negative for a monoclonal B-cell population. These  findings are not diagnostic of a lymphoproliferative process.  Accession: [id number] Bone Marrow Flow Cytometry - NO MONOCLONAL B-CELL OR PHENOTYPICALLY ABERRANT T-CELL POPULATION IDENTIFIED.   12/03/2018 Imaging   TTE:  1. The left ventricle has normal systolic function with an ejection fraction of 60-65%. The cavity size was normal. Left ventricular diastolic Doppler parameters are consistent with impaired relaxation.  2. The right ventricle has normal systolic function. The cavity was normal. There is no increase in right ventricular wall thickness.  3. Large pleural effusion.  4. Clinical correlation suggested.  5. The mitral valve is grossly normal.  6. The tricuspid valve is grossly normal.  7. The aortic valve is grossly normal. Aortic valve regurgitation was not assessed by color flow Doppler.  8. The aortic root is normal in size and structure.   12/08/2018 Procedure   Left thoracentesis   12/08/2018 Pathology Results   Diagnosis PLEURAL FLUID, LEFT (SPECIMEN 1 OF 1 COLLECTED 12/08/18): - B-CELL LYMPHOMA - SEE COMMENT   12/09/2018 Procedure   Left supraclavicular LN bx    12/09/2018 Pathology Results   Accession: [id number] Lymph node for lymphoma, left deep cervical - DIFFUSE LARGE B-CELL LYMPHOMA - SEE COMMENT   12/21/2018 - 01/10/2019 Chemotherapy   The patient had DOXOrubicin (ADRIAMYCIN) chemo injection 80 mg, 50 mg/m2 = 80 mg, Intravenous,  Once, 1 of 6 cycles Administration: 80 mg (12/21/2018) palonosetron (ALOXI) injection 0.25 mg, 0.25 mg, Intravenous,  Once, 1 of 6 cycles Administration: 0.25 mg (12/21/2018) pegfilgrastim-cbqv (UDENYCA) injection 6 mg, 6 mg, Subcutaneous, Once, 1 of 6 cycles Administration: 6 mg (12/23/2018) vinCRIStine (ONCOVIN) 2 mg in sodium chloride 0.9 % 50 mL chemo infusion, 2 mg, Intravenous,  Once, 1 of 6 cycles Administration: 2 mg (12/21/2018) riTUXimab (RITUXAN) 600 mg in sodium chloride 0.9 % 250 mL (1.9355 mg/mL) infusion, 375 mg/m2 =  600  mg, Intravenous,  Once, 1 of 1 cycle Administration: 600 mg (12/21/2018) cyclophosphamide (CYTOXAN) 1,200 mg in sodium chloride 0.9 % 250 mL chemo infusion, 750 mg/m2 = 1,200 mg, Intravenous,  Once, 1 of 6 cycles Administration: 1,200 mg (12/21/2018) riTUXimab (RITUXAN) 600 mg in sodium chloride 0.9 % 190 mL infusion, 375 mg/m2 = 600 mg (100 % of original dose 375 mg/m2), Intravenous,  Once, 0 of 5 cycles Dose modification: 375 mg/m2 (original dose 375 mg/m2, Cycle 2, Reason: Provider Judgment, Comment: VO to change to RIR) fosaprepitant (EMEND) 150 mg, dexamethasone (DECADRON) 12 mg in sodium chloride 0.9 % 145 mL IVPB, , Intravenous,  Once, 1 of 6 cycles Administration:  (12/21/2018)  for chemotherapy treatment.    01/10/2019 -  Chemotherapy   The patient had DOXOrubicin (ADRIAMYCIN) 16 mg, etoposide (VEPESID) 80 mg, vinCRIStine (ONCOVIN) 0.6 mg in sodium chloride 0.9 % 1,000 mL chemo infusion, , Intravenous, Once, 1 of 4 cycles Administration:  (01/10/2019),  (01/11/2019),  (01/12/2019) ondansetron (ZOFRAN) 8 mg, dexamethasone (DECADRON) 10 mg in sodium chloride 0.9 % 50 mL IVPB, , Intravenous,  Once, 1 of 4 cycles Administration: 18 mg (01/10/2019), 8 mg (01/11/2019), 18 mg (01/12/2019) cyclophosphamide (CYTOXAN) 1,180 mg in sodium chloride 0.9 % 250 mL chemo infusion, 750 mg/m2 = 1,180 mg, Intravenous,  Once, 1 of 4 cycles  for chemotherapy treatment.    01/17/2019 -  Chemotherapy   The patient had pegfilgrastim-cbqv (UDENYCA) injection 6 mg, 6 mg, Subcutaneous, Once, 0 of 4 cycles riTUXimab (RITUXAN) 600 mg in sodium chloride 0.9 % 250 mL (1.9355 mg/mL) infusion, 375 mg/m2 = 600 mg, Intravenous,  Once, 0 of 4 cycles  for chemotherapy treatment.       REVIEW OF SYSTEMS:   Constitutional: Denies fevers, chills.  Reports more fatigue this morning. Ears, nose, mouth, throat, and face: Denies mucositis or sore throat Respiratory: Denies cough, dyspnea or wheezes Cardiovascular: Denies palpitation,  chest discomfort Gastrointestinal: Reports mild nausea, no vomiting.  Bowels moving well. Skin: Denies abnormal skin rashes Lymphatics: Denies new lymphadenopathy or easy bruising Neurological:Denies numbness, tingling or new weaknesses Behavioral/Psych: Mood is stable, no new changes  Extremities: No lower extremity edema All other systems were reviewed with the patient and are negative.  I have reviewed the past medical history, past surgical history, social history and family history with the patient and they are unchanged from previous note.   PHYSICAL EXAMINATION: ECOG PERFORMANCE STATUS: 0 - Asymptomatic  Vitals:   01/12/19 2027 01/13/19 0511  BP: 140/72 (!) 152/82  Pulse: 60 63  Resp: 17 16  Temp: 98.1 F (36.7 C) 98.2 F (36.8 C)  SpO2: 99% 100%   Filed Weights   01/10/19 1735  Weight: 132 lb 1.6 oz (59.9 kg)    Intake/Output from previous day: 08/26 0701 - 08/27 0700 In: 720 [P.O.:720] Out: -   GENERAL:alert, no distress and comfortable SKIN: skin color, texture, turgor are normal, no rashes or significant lesions EYES: normal, Conjunctiva are pink and non-injected, sclera clear OROPHARYNX:no exudate, no erythema and lips, buccal mucosa, and tongue normal  NECK: supple, thyroid normal size, non-tender, without nodularity LYMPH:  no palpable lymphadenopathy in the cervical, axillary or inguinal LUNGS: Diminished left base, otherwise clear. HEART: regular rate & rhythm and no murmurs and no lower extremity edema ABDOMEN:abdomen soft, non-tender and normal bowel sounds Musculoskeletal:no cyanosis of digits and no clubbing  NEURO: alert & oriented x 3 with fluent speech, no focal motor/sensory deficits  LABORATORY DATA:  I  have reviewed the data as listed CMP Latest Ref Rng & Units 01/13/2019 01/12/2019 01/11/2019  Glucose 70 - 99 mg/dL 93 120(H) 134(H)  BUN 6 - 20 mg/dL _0 Creatinine 0.44 - 1.00 mg/dL 0.62 0.56 0.60  Sodium 135 - 145 mmol/L 136 136 137   Potassium 3.5 - 5.1 mmol/L 3.3(L) 3.6 3.5  Chloride 98 - 111 mmol/L 106 105 103  CO2 22 - 32 mmol/L 21(L) 24 26  Calcium 8.9 - 10.3 mg/dL 8.5(L) 8.6(L) 9.0  Total Protein 6.5 - 8.1 g/dL 5.5(L) 5.9(L) 6.1(L)  Total Bilirubin 0.3 - 1.2 mg/dL 0.5 0.5 0.6  Alkaline Phos 38 - 126 U/L 61 62 72  AST 15 - 41 U/L _1 ALT 0 - 44 U/L _2 Lab Results  Component Value Date   WBC 6.2 01/13/2019   HGB 10.2 (L) 01/13/2019   HCT 32.0 (L) 01/13/2019   MCV 85.6 01/13/2019   PLT 359 01/13/2019   NEUTROABS 5.1 01/13/2019    Dg Chest 1 View  Result Date: 12/24/2018 CLINICAL DATA:  Status post thoracentesis EXAM: CHEST  1 VIEW COMPARISON:  December 21, 2018 FINDINGS: No pneumothorax evident. Left pleural effusion is much smaller currently following thoracentesis. Small residual pleural effusion remains with left base atelectasis. Lungs elsewhere are clear. Heart is slightly enlarged with pulmonary vascularity normal. There is aortic atherosclerosis. Port-A-Cath tip is in the superior vena cava. There is mild degenerative change in each shoulder. IMPRESSION: No evident pneumothorax. Small residual left pleural effusion with left base atelectasis. Lungs elsewhere clear. Heart mildly enlarged. Aortic Atherosclerosis (ICD10-I70.0). Electronically Signed   By: Lowella Grip III M.D.   On: 12/24/2018 11:44   Dg Chest 2 View  Result Date: 12/22/2018 CLINICAL DATA:  Shortness of breath EXAM: CHEST - 2 VIEW COMPARISON:  12/08/2018 FINDINGS: Right Port-A-Cath remains in place, unchanged. Large left pleural effusion, enlarging since prior study. Left lower lobe atelectasis. Small right effusion with right base atelectasis. Heart is normal size. No acute bony abnormality. IMPRESSION: Large left pleural effusion and small right pleural effusion. Bibasilar atelectasis. Electronically Signed   By: Rolm Baptise M.D.   On: 12/22/2018 00:31   Dg Chest Port 1 View  Result Date: 12/28/2018 CLINICAL DATA:   Shortness of breath EXAM: PORTABLE CHEST 1 VIEW COMPARISON:  Four days ago FINDINGS: Small left pleural effusion. There is presumed adjacent atelectasis. No edema or air bronchogram. Normal heart size and mediastinal contours. Right IJ catheter with tip at the upper SVC. IMPRESSION: 1. Stable from study 4 days ago. 2. Small left pleural effusion. Electronically Signed   By: Monte Fantasia M.D.   On: 12/28/2018 04:53   US Thoracentesis Asp Pleural Space W/img Guide  Result Date: 12/24/2018 INDICATION: Patient with history of lymphoma, recurrent pleural effusion. Request for therapeutic thoracentesis today in IR. EXAM: ULTRASOUND GUIDED LEFT THORACENTESIS MEDICATIONS: 10 mL 1% lidocaine. COMPLICATIONS: None immediate. PROCEDURE: An ultrasound guided thoracentesis was thoroughly discussed with the patient and questions answered. The benefits, risks, alternatives and complications were also discussed. The patient understands and wishes to proceed with the procedure. Written consent was obtained. Ultrasound was performed to localize and mark an adequate pocket of fluid in the left chest. The area was then prepped and draped in the normal sterile fashion. 1% Lidocaine was used for local anesthesia. Under ultrasound guidance a 6 Fr Safe-T-Centesis catheter was introduced. Thoracentesis was performed. The catheter was removed and a dressing applied. FINDINGS:  A total of approximately 1.6 L of cloudy yellow fluid was removed. IMPRESSION: Successful ultrasound guided left thoracentesis yielding 1.6 L of pleural fluid. Read by Candiss Norse, PA-C Electronically Signed   By: Corrie Mckusick D.O.   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 The intent is for cure. -We discussed some of the risks, benefitsandside-effects of Rituximab,Etoposide, Vincristine, Adriamycin,Cytoxan,Prednisone.The regimen requires inpatient admission due to continuous chemotherapy infusion  -Some of the short term side-effects included, though not limited to, risk of fatigue, weight loss,tumor lysis syndrome, risk of allergic reactions,pancytopenia, life-threatening infections, need for transfusions of blood products, nausea, vomiting, change in bowel habits,hairloss, risk of congestive heart failure, admission to hospital for various reasons, and risks of death.  -Long term side-effects are also discussed includingpermanent damage to nerve function, chronic fatigue, and rare secondary malignancy including bone marrow disorders. -The patient is aware that the response rates discussed earlier is not guaranteed. -After a long discussion, patient made an informed decision to proceed with the prescribed plan of care. -She tolerated day 2 of cycle 1 of her chemotherapy well overall.  Labs have been reviewed.  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 -S/p thoracentesis x 2 -Recent CXR showed improvement in left pleural effusion -Clinically, patient denies any recurrent dyspnea; no significant effusion on exam -We will monitor it for now   Normocytic anemia -Secondary to anemia of chronic disease  -Hgb is 10.2 today which is stable -Patient denies any symptoms of bleeding -We will monitor it for now; continue daily CBC with differential  Leukocytosis -Most likely secondary to G-CSF -White blood  cell count is normal this morning -We will monitor it for now  Hyponatremia -Sodium level has normalized -Continue Pedialyte and gentle salt addition to the diet, and maintain adequate hydration -We will monitor it closely  Hypokalemia -Potassium stable at 3.3 -Increase potassium chloride to 20 mEq twice a day -Check CMET daily and adjust potassium as needed  Chemotherapy-associated nausea  -Secondary to chemotherapy -Symptoms relatively well controlled  -Continue PRN-anti-emetics  All questions were answered. The patient knows to call the clinic with any problems, questions or concerns. No barriers to learning was detected.  EPOCH scheduled on 01/10/2019. Rituximab and Udenyca on 01/17/2019. Return to clinic for labs, port flush and clinic appt with Dr. Irene Limbo on 01/24/2019.    LOS: 3 days   Mikey Bussing, DNP, AGPCNP-BC, AOCNP 01/13/19   ADDENDUM  .Patient was Personally and independently interviewed, examined and relevant elements of the history of present illness were reviewed in details and an assessment and plan was created. All elements of the patient's history of present illness , assessment and plan were discussed in details with Mikey Bussing, DNP,. The above documentation reflects our combined findings assessment and plan.  Sullivan Lone MD MS

## 2019-01-14 LAB — COMPREHENSIVE METABOLIC PANEL
ALT: 31 U/L (ref 0–44)
AST: 20 U/L (ref 15–41)
Albumin: 3.3 g/dL — ABNORMAL LOW (ref 3.5–5.0)
Alkaline Phosphatase: 56 U/L (ref 38–126)
Anion gap: 6 (ref 5–15)
BUN: 15 mg/dL (ref 6–20)
CO2: 25 mmol/L (ref 22–32)
Calcium: 8.6 mg/dL — ABNORMAL LOW (ref 8.9–10.3)
Chloride: 106 mmol/L (ref 98–111)
Creatinine, Ser: 0.62 mg/dL (ref 0.44–1.00)
GFR calc Af Amer: >60 mL/min (ref >60)
GFR calc non Af Amer: >60 mL/min (ref >60)
Glucose, Bld: 85 mg/dL (ref 70–99)
Potassium: 3.3 mmol/L — ABNORMAL LOW (ref 3.5–5.1)
Sodium: 137 mmol/L (ref 135–145)
Total Bilirubin: 0.6 mg/dL (ref 0.3–1.2)
Total Protein: 5.6 g/dL — ABNORMAL LOW (ref 6.5–8.1)

## 2019-01-14 LAB — CBC WITH DIFFERENTIAL/PLATELET
Abs Immature Granulocytes: 0.01 10*3/uL (ref 0.00–0.07)
Basophils Absolute: 0 10*3/uL (ref 0.0–0.1)
Basophils Relative: 0 %
Eosinophils Absolute: 0 10*3/uL (ref 0.0–0.5)
Eosinophils Relative: 0 %
HCT: 31.3 % — ABNORMAL LOW (ref 36.0–46.0)
Hemoglobin: 10.1 g/dL — ABNORMAL LOW (ref 12.0–15.0)
Immature Granulocytes: 0 %
Lymphocytes Relative: 13 %
Lymphs Abs: 0.5 10*3/uL — ABNORMAL LOW (ref 0.7–4.0)
MCH: 26.9 pg (ref 26.0–34.0)
MCHC: 32.3 g/dL (ref 30.0–36.0)
MCV: 83.5 fL (ref 80.0–100.0)
Monocytes Absolute: 0.2 10*3/uL (ref 0.1–1.0)
Monocytes Relative: 6 %
Neutro Abs: 2.7 10*3/uL (ref 1.7–7.7)
Neutrophils Relative %: 81 %
Platelets: 368 10*3/uL (ref 150–400)
RBC: 3.75 MIL/uL — ABNORMAL LOW (ref 3.87–5.11)
RDW: 19.6 % — ABNORMAL HIGH (ref 11.5–15.5)
WBC: 3.4 10*3/uL — ABNORMAL LOW (ref 4.0–10.5)
nRBC: 0 % (ref 0.0–0.2)

## 2019-01-14 LAB — URIC ACID: Uric Acid, Serum: 3.8 mg/dL (ref 2.5–7.1)

## 2019-01-14 LAB — PHOSPHORUS: Phosphorus: 3.2 mg/dL (ref 2.5–4.6)

## 2019-01-14 LAB — MAGNESIUM: Magnesium: 2.1 mg/dL (ref 1.7–2.4)

## 2019-01-14 MED ORDER — HEPARIN SOD (PORK) LOCK FLUSH 100 UNIT/ML IV SOLN
500.0000 [IU] | INTRAVENOUS | Status: AC | PRN
  Administered 2019-01-14: 500 [IU]

## 2019-01-14 NOTE — Discharge Summary (Signed)
.  [facility]  [phone] [phone]    Physician Discharge Summary     Patient ID: Denise Walls MRN: [id number] DOB/AGE: [birth year] 59 y.o.  Admit date: 01/10/2019 Discharge date: 01/14/2019  Primary Care Physician:  [name], MD   Discharge Diagnoses:    Present on Admission:  DLBCL (diffuse large B cell lymphoma) ([place])  Normocytic anemia  Malignant pleural effusion  Diffuse large B cell lymphoma ([place])   Discharge Medications:  Allergies as of 01/14/2019      Reactions   Nsaids Anaphylaxis, Other (See Comments)   Abdominal pain, GI Bleeding      Medication List    STOP taking these medications   allopurinol 300 MG tablet Commonly known as: ZYLOPRIM   amLODipine 5 MG tablet Commonly known as: NORVASC   lovastatin 40 MG tablet Commonly known as: MEVACOR   prochlorperazine 10 MG tablet Commonly known as: COMPAZINE   traMADol 50 MG tablet Commonly known as: ULTRAM     TAKE these medications   calcium carbonate 500 MG chewable tablet Commonly known as: TUMS - dosed in mg elemental calcium Chew 2 tablets by mouth daily as needed for indigestion or heartburn.   cholecalciferol 25 MCG (1000 UT) tablet Commonly known as: VITAMIN D3 Take 1,000 Units by mouth daily.   HYDROcodone-acetaminophen 5-325 MG tablet Commonly known as: Norco Take 1-2 tablets by mouth every 6 (six) hours as needed for moderate pain or severe pain.   Immune Enhance Tabs Take 1 tablet by mouth daily.   lidocaine-prilocaine cream Commonly known as: EMLA Apply to affected area once   Melatonin 3 MG Tabs Take 3 mg by mouth at bedtime as needed (sleep).   mesalamine 1.2 g EC tablet Commonly known as: LIALDA Take 2.4 g by mouth daily.   multivitamin-prenatal 27-0.8 MG Tabs tablet Take 1 tablet by mouth daily at 12 noon.   ondansetron 8 MG tablet Commonly known as: Zofran Take 1 tablet (8 mg total) by mouth 2  (two) times daily as needed for refractory nausea / vomiting. Start on day 3 after cyclophosphamide chemotherapy.   OVER THE COUNTER MEDICATION Take 2 tablets by mouth daily as needed (acid reflux). Soothing digestive relief otc   polyethylene glycol 17 g packet Commonly known as: MIRALAX / GLYCOLAX Take 17 g by mouth daily as needed for mild constipation.   potassium chloride SA 20 MEQ tablet Commonly known as: K-DUR Take 1 tablet (20 mEq total) by mouth daily.   traZODone 50 MG tablet Commonly known as: DESYREL Take 1 tablet (50 mg total) by mouth at bedtime as needed for sleep. What changed: how much to take        Disposition and Follow-up:   Significant Diagnostic Studies:  Dg Chest 1 View  Result Date: 12/24/2018 CLINICAL DATA:  Status post thoracentesis EXAM: CHEST  1 VIEW COMPARISON:  December 21, 2018 FINDINGS: No pneumothorax evident. Left pleural effusion is much smaller currently following thoracentesis. Small residual pleural effusion remains with left base atelectasis. Lungs elsewhere are clear. Heart is slightly enlarged with pulmonary vascularity normal. There is aortic atherosclerosis. Port-A-Cath tip is in the superior vena cava. There is mild degenerative change in each shoulder. IMPRESSION: No evident pneumothorax. Small residual left pleural effusion with left base atelectasis. Lungs elsewhere clear. Heart mildly enlarged. Aortic Atherosclerosis (ICD10-I70.0). Electronically Signed   By: Lowella Grip III M.D.   On: 12/24/2018 11:44   Dg Chest 2 View  Result  Date: 12/22/2018 CLINICAL DATA:  Shortness of breath EXAM: CHEST - 2 VIEW COMPARISON:  12/08/2018 FINDINGS: Right Port-A-Cath remains in place, unchanged. Large left pleural effusion, enlarging since prior study. Left lower lobe atelectasis. Small right effusion with right base atelectasis. Heart is normal size. No acute bony abnormality. IMPRESSION: Large left pleural effusion and small right pleural effusion.  Bibasilar atelectasis. Electronically Signed   By: Rolm Baptise M.D.   On: 12/22/2018 00:31   Dg Chest Port 1 View  Result Date: 12/28/2018 CLINICAL DATA:  Shortness of breath EXAM: PORTABLE CHEST 1 VIEW COMPARISON:  Four days ago FINDINGS: Small left pleural effusion. There is presumed adjacent atelectasis. No edema or air bronchogram. Normal heart size and mediastinal contours. Right IJ catheter with tip at the upper SVC. IMPRESSION: 1. Stable from study 4 days ago. 2. Small left pleural effusion. Electronically Signed   By: Monte Fantasia M.D.   On: 12/28/2018 04:53   US Thoracentesis Asp Pleural Space W/img Guide  Result Date: 12/24/2018 INDICATION: Patient with history of lymphoma, recurrent pleural effusion. Request for therapeutic thoracentesis today in IR. EXAM: ULTRASOUND GUIDED LEFT THORACENTESIS MEDICATIONS: 10 mL 1% lidocaine. COMPLICATIONS: None immediate. PROCEDURE: An ultrasound guided thoracentesis was thoroughly discussed with the patient and questions answered. The benefits, risks, alternatives and complications were also discussed. The patient understands and wishes to proceed with the procedure. Written consent was obtained. Ultrasound was performed to localize and mark an adequate pocket of fluid in the left chest. The area was then prepped and draped in the normal sterile fashion. 1% Lidocaine was used for local anesthesia. Under ultrasound guidance a 6 Fr Safe-T-Centesis catheter was introduced. Thoracentesis was performed. The catheter was removed and a dressing applied. FINDINGS: A total of approximately 1.6 L of cloudy yellow fluid was removed. IMPRESSION: Successful ultrasound guided left thoracentesis yielding 1.6 L of pleural fluid. Read by Candiss Norse, PA-C Electronically Signed   By: Corrie Mckusick D.O.   On: 12/24/2018 11:47    Discharge Laboratory Values:  . CBC Latest Ref Rng & Units 01/14/2019 01/13/2019 01/12/2019  WBC 4.0 - 10.5 K/uL 3.4(L) 6.2 11.5(H)    Hemoglobin 12.0 - 15.0 g/dL 10.1(L) 10.2(L) 9.8(L)  Hematocrit 36.0 - 46.0 % 31.3(L) 32.0(L) 31.4(L)  Platelets 150 - 400 K/uL 368 359 401(H)    . CMP Latest Ref Rng & Units 01/14/2019 01/13/2019 01/12/2019  Glucose 70 - 99 mg/dL 85 93 120(H)  BUN 6 - 20 mg/dL 15 17 18   Creatinine 0.44 - 1.00 mg/dL 0.62 0.62 0.56  Sodium 135 - 145 mmol/L 137 136 136  Potassium 3.5 - 5.1 mmol/L 3.3(L) 3.3(L) 3.6  Chloride 98 - 111 mmol/L 106 106 105  CO2 22 - 32 mmol/L 25 21(L) 24  Calcium 8.9 - 10.3 mg/dL 8.6(L) 8.5(L) 8.6(L)  Total Protein 6.5 - 8.1 g/dL 5.6(L) 5.5(L) 5.9(L)  Total Bilirubin 0.3 - 1.2 mg/dL 0.6 0.5 0.5  Alkaline Phos 38 - 126 U/L 56 61 62  AST 15 - 41 U/L 20 18 17   ALT 0 - 44 U/L 31 24 23      Brief H and P: For complete details please refer to admission H and P, but in brief, Ms. Petitjean is a 58 year old female with a past medical history significant for anxiety, Crohn's disease, hypertension, hyperlipidemia, migraine headaches.  The patient's history is obtained through a [place] interpreter via video, Lenell Antu [phone].  The patient reports that she has history of Crohn's disease, for which she has been taking  mesalamine with very good control of her symptoms. She was previously followed by Dr. Kathy Breach gastroenterology and to transition her care to a different gastroenterologist after Dr[facility]am[facility] to [facility]. Her new gastroenterologist stopped her mesalamine some time in 2019,and soon after that, she began to develop new onset left lower quadrant discomfort/pain,nonradiating, dull, intermittent, mild to moderate in intensity, exacerbated by eating and improved with defecation. She underwent colonoscopy, which was unremarkable, and was recommended to modify her diet without any improvement. She then re-established her care with Dr. Etter Sjogren ordered CT abdomen/pelvis that showed diffuse abdominal lymphadenopathy. PET showed diffuse lymph node involvement in the neck, chest,  abdomen and the pelvis,and patient was referred to oncology for further evaluation.  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 The patient initially received 1 cycle of R-CHOP which she tolerated fairly well with exception of hyponatremia and neutropenia.  Prior to her second planned cycle of chemotherapy, she was found to be positive for Myc and BCL-2 rearrangement.  She is now being switched to [facility] and is here for cycle #1 of his chemotherapy.  Today, the patient reports that she is feeling well overall.  She reports a very good appetite and has been eating a lot at home.  She reports that her breathing is stable and maybe even slightly improved.  She denies any dizziness, headaches, chest discomfort, cough.  Denies abdominal pain, nausea, vomiting, constipation, diarrhea.  Denies bleeding.  The patient is here for admission for cycle #1 of R-EPOCH.  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 The intent is for cure. -We discussed some of the risks, benefitsandside-effects of Rituximab,Etoposide, Vincristine, Adriamycin,Cytoxan,Prednisone.The regimen requires inpatient admission due to continuous chemotherapy infusion  -Some of the short term side-effects included, though not limited to, risk of fatigue, weight loss,tumor lysis syndrome, risk of allergic reactions,pancytopenia, life-threatening infections, need for transfusions of blood products, nausea, vomiting, change in bowel habits,hairloss,  risk of congestive heart failure, admission to hospital for various reasons, and risks of death.  -Long term side-effects are also discussed includingpermanent damage to nerve function, chronic fatigue, and rare secondary malignancy including bone marrow disorders. -The patient is aware that the response rates discussed earlier is not guaranteed. -After a long discussion, patient made an informed decision to proceed with the prescribed plan of care. -She tolerated C2 of her chemotherapy well overall.  Labs remained stable. -She has antiemetics available to her as needed on discharge. -will stop allopurinol due to low risk of TLS at this time -f/u for Rituximab and Udenyca on 01/17/2019. Return to clinic for labs, port flush and clinic appt with Dr. Irene Limbo on 01/24/2019.   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 -S/p thoracentesis x 2 -Recent CXR showed improvement in left pleural effusion -Clinically, patient denies any recurrent dyspnea; no significant effusion on exam -We will monitor it for now   Normocytic anemia -Secondary to anemia of chronic disease  -Hgb is 10.1 today which is stable -Patient denies any symptoms of bleeding  Leukocytosis -Most likely secondary to G-CSF -White blood cell count is normal this  morning -We will monitor it for now for popssible neutropenia -Udenyca scheduled for 8/31 for neutropenia prophylaxis  Hyponatremia -Sodium level has normalized -Continue Pedialyte and gentle salt addition to the diet, and maintain adequate hydration -We will monitor it closely  Hypokalemia -Potassium stable at 3.3 -likely from high dose steroids -continue po potassium on discharge.  Chemotherapy-associated nausea  -Secondary to chemotherapy -Symptoms relatively well controlled  -Continue PRN-anti-emetics   Physical Exam at Discharge: BP (!) 146/90 (BP Location: Left Arm)    Pulse 64    Temp (!) 97.5 F (36.4 C) (Oral)    Resp 16    Ht  5' (1.524 m) Comment: per previous documentation   Wt 132 lb 1.6 oz (59.9 kg)    LMP  (LMP Unknown)    SpO2 96%    BMI 25.80 kg/m  . GENERAL:alert, in no acute distress and comfortable SKIN: no acute rashes, no significant lesions EYES: conjunctiva are pink and non-injected, sclera anicteric OROPHARYNX: MMM, no exudates, no oropharyngeal erythema or ulceration NECK: supple, no JVD LYMPH:  no palpable lymphadenopathy in the cervical, axillary or inguinal regions LUNGS: clear to auscultation b/l with normal respiratory effort HEART: regular rate & rhythm ABDOMEN:  normoactive bowel sounds , non tender, not distended. Extremity: no pedal edema PSYCH: alert & oriented x 3 with fluent speech NEURO: no focal motor/sensory deficits   Hospital Course:  Active Problems:   DLBCL (diffuse large B cell lymphoma) ([facility])   Normocytic anemia   Malignant pleural effusion   Diffuse large B cell lymphoma ([facility])   Encounter for antineoplastic chemotherapy   Diet:  Regular diet. Encouraged hydration and potassium rich diet  Activity:  Regular. Infection precautions, COVID precautions   Condition at Discharge:   Stable  Signed: Dr. Sullivan Lone MD [place] [phone]  01/14/2019, 11:30 AM  TT spent discharging patient>15mns

## 2019-01-17 ENCOUNTER — Other Ambulatory Visit: Payer: Self-pay

## 2019-01-17 ENCOUNTER — Inpatient Hospital Stay: Payer: Commercial Managed Care - PPO

## 2019-01-17 ENCOUNTER — Encounter: Payer: Self-pay | Admitting: *Deleted

## 2019-01-17 VITALS — BP 123/84 | HR 79 | Temp 98.7°F | Resp 17 | Ht 60.0 in | Wt 132.2 lb

## 2019-01-17 DIAGNOSIS — E860 Dehydration: Secondary | ICD-10-CM | POA: Diagnosis not present

## 2019-01-17 DIAGNOSIS — Z5189 Encounter for other specified aftercare: Secondary | ICD-10-CM | POA: Diagnosis not present

## 2019-01-17 DIAGNOSIS — Z5112 Encounter for antineoplastic immunotherapy: Secondary | ICD-10-CM | POA: Diagnosis not present

## 2019-01-17 DIAGNOSIS — C8339 Diffuse large B-cell lymphoma, extranodal and solid organ sites: Secondary | ICD-10-CM | POA: Diagnosis present

## 2019-01-17 DIAGNOSIS — C8330 Diffuse large B-cell lymphoma, unspecified site: Secondary | ICD-10-CM

## 2019-01-17 DIAGNOSIS — E871 Hypo-osmolality and hyponatremia: Secondary | ICD-10-CM | POA: Diagnosis not present

## 2019-01-17 DIAGNOSIS — Z5111 Encounter for antineoplastic chemotherapy: Secondary | ICD-10-CM | POA: Diagnosis present

## 2019-01-17 MED ORDER — PEGFILGRASTIM-CBQV 6 MG/0.6ML ~~LOC~~ SOSY
PREFILLED_SYRINGE | SUBCUTANEOUS | Status: AC
  Filled 2019-01-17: qty 0.6

## 2019-01-17 MED ORDER — SODIUM CHLORIDE 0.9 % IV SOLN
Freq: Once | INTRAVENOUS | Status: AC
  Administered 2019-01-17: 12:00:00 via INTRAVENOUS
  Filled 2019-01-17: qty 250

## 2019-01-17 MED ORDER — ACETAMINOPHEN 325 MG PO TABS
650.0000 mg | ORAL_TABLET | Freq: Once | ORAL | Status: AC
  Administered 2019-01-17: 12:00:00 650 mg via ORAL

## 2019-01-17 MED ORDER — PEGFILGRASTIM-CBQV 6 MG/0.6ML ~~LOC~~ SOSY
6.0000 mg | PREFILLED_SYRINGE | Freq: Once | SUBCUTANEOUS | Status: AC
  Administered 2019-01-17: 13:00:00 6 mg via SUBCUTANEOUS

## 2019-01-17 MED ORDER — ACETAMINOPHEN 325 MG PO TABS
ORAL_TABLET | ORAL | Status: AC
  Filled 2019-01-17: qty 2

## 2019-01-17 MED ORDER — SODIUM CHLORIDE 0.9% FLUSH
10.0000 mL | INTRAVENOUS | Status: DC | PRN
  Administered 2019-01-17: 15:00:00 10 mL
  Filled 2019-01-17: qty 10

## 2019-01-17 MED ORDER — DIPHENHYDRAMINE HCL 25 MG PO CAPS
ORAL_CAPSULE | ORAL | Status: AC
  Filled 2019-01-17: qty 2

## 2019-01-17 MED ORDER — SODIUM CHLORIDE 0.9 % IV SOLN: 375.0000 mg/m2 | Freq: Once | INTRAVENOUS | Status: DC

## 2019-01-17 MED ORDER — DIPHENHYDRAMINE HCL 25 MG PO CAPS
50.0000 mg | ORAL_CAPSULE | Freq: Once | ORAL | Status: AC
  Administered 2019-01-17: 12:00:00 50 mg via ORAL

## 2019-01-17 MED ORDER — SODIUM CHLORIDE 0.9 % IV SOLN
375.0000 mg/m2 | Freq: Once | INTRAVENOUS | Status: AC
  Administered 2019-01-17: 13:00:00 600 mg via INTRAVENOUS
  Filled 2019-01-17: qty 10

## 2019-01-17 MED ORDER — HEPARIN SOD (PORK) LOCK FLUSH 100 UNIT/ML IV SOLN
500.0000 [IU] | Freq: Once | INTRAVENOUS | Status: AC | PRN
  Administered 2019-01-17: 15:00:00 500 [IU]
  Filled 2019-01-17: qty 5

## 2019-01-17 NOTE — Patient Instructions (Signed)
Rituximab injection Qu es este medicamento? El RITUXIMAB es un anticuerpo monoclonal. Se utiliza para tratar ciertos tipos de cncer, tales como el linfoma no Hodgkin y la leucemia linfoctica crnica. Tambin se [place] para tratar la artritis reumatoide, la poliangitis granulomatosa (o granulomatosis de Wegener), la poliangitis microscpica y el pnfigo vulgar. Este medicamento puede ser utilizado para otros usos; si tiene alguna pregunta consulte con su proveedor de atencin mdica o con su farmacutico. MARCAS COMUNES: Rituxan, RUXIENCE Qu le debo informar a mi profesional de la salud antes de tomar este medicamento? Necesitan saber si usted presenta alguno de los siguientes problemas o situaciones: enfermedad cardiaca infeccin (especialmente infecciones virales, como hepatitis B, varicela, fuegos labiales o herpes) problemas del sistema inmunolgico ritmo cardiaco irregular enfermedad renal recuentos sanguneos bajos, como baja cantidad de glbulos blancos, plaquetas o glbulos rojos enfermedad pulmonar o respiratoria, como asma recientemente recibi o tiene programado recibir una vacuna una reaccin alrgica o inusual al rituximab, a otros medicamentos, alimentos, colorantes o conservantes si est embarazada o buscando quedar embarazada si est amamantando a un beb Cmo debo utilizar este medicamento? Este medicamento se administra mediante infusin por va intravenosa. Lo administra un profesional de Technical sales engineer calificado en un hospital o en un entorno clnico. Su farmacutico le dar una [place] del medicamento especial con cada receta y relleno. Asegrese de leer esta informacin cada vez cuidadosamente. Hable con su pediatra para informarse acerca del uso de este medicamento en nios. Este medicamento no est aprobado para uso en nios. Sobredosis: Pngase en contacto inmediatamente con un centro toxicolgico o una sala de urgencia si usted cree que haya tomado demasiado medicamento. ATENCIN:  [employer] es solo para usted. No comparta este medicamento con nadie. Qu sucede si me olvido de una dosis? Es importante no olvidar ninguna dosis. Informe a su mdico o a su profesional de la salud si no puede asistir a Photographer. Qu puede interactuar con este medicamento? cisplatino vacunas de virus vivos Puede ser que esta lista no menciona todas las posibles interacciones. Informe a su profesional de KB Home	[place] de [employer] productos a base de hierbas, medicamentos de [place] o suplementos nutritivos que est tomando. Si usted fuma, consume bebidas alcohlicas o si utiliza drogas ilegales, indqueselo tambin a su profesional de KB Home	[place]. Algunas sustancias pueden interactuar con su medicamento. A qu debo estar atento al usar Coca-Cola? Se supervisar su estado de salud atentamente mientras reciba este medicamento. Usted podra necesitar realizarse [employer] de sangre mientras est usando [place]. Este medicamento puede causar Chief of Staff graves. Para reducir su riesgo, es posible que necesite tomar un medicamento antes del tratamiento con este medicamento. Tome su medicamento segn las instrucciones. En algunos pacientes, este medicamento puede causar una infeccin grave en el cerebro que puede llevar a la muerte. Si tiene cualquier problema para ver, pensar, hablar, caminar o pararse, infrmeselo a su profesional de KB Home	[place] de inmediato. Si no puede ponerse en contacto con su profesional de la salud, busque otra fuente de atencin mdica de [place] urgente. Consulte a su mdico o a su profesional de la salud si tiene fiebre, escalofros o dolor de garganta, o cualquier otro sntoma de resfro o gripe. No se trate usted mismo. Este medicamento reduce la capacidad del cuerpo para combatir infecciones. Trate de no acercarse a personas que estn enfermas. No debe quedar [employer] est [employer], o al menos por 12 meses despus de dejar de  usarlo. Las mujeres deben informar a su  mdico si estn buscando quedar embarazadas o si creen que podran estar embarazadas. Existe la posibilidad de efectos secundarios graves en un beb sin nacer. Para obtener ms informacin, hable con su profesional de la salud o su farmacutico. No debe Economist a un beb mientras est tomando este medicamento, o al menos por 6 meses despus de dejar de usarlo. Qu efectos secundarios puedo tener al [employer] este medicamento? Efectos secundarios que debe informar a su mdico o a Barrister's clerk de la salud tan pronto como sea posible: Chief of Staff, como erupcin cutnea, comezn/picazn o urticaria; hinchazn de la cara, los labios o la lengua problemas respiratorios dolor en el pecho cambios en la visin diarrea dolor de cabeza con fiebre, rigidez del cuello, sensibilidad a la luz, nuseas, o confusin ritmo cardiaco rpido, irregular prdida de memoria recuentos sanguneos bajos: este medicamento podra reducir la cantidad de glbulos blancos, glbulos rojos y plaquetas. Su riesgo de infeccin y [place]. llagas en la boca problemas de equilibrio, del habla o al caminar enrojecimiento, formacin de ampollas, descamacin o distensin de la piel, incluso dentro de la boca signos de infeccin: fiebre o escalofros, tos, dolor de garganta, dolor o dificultad para orinar signos y sntomas de lesin al rin, tales como dificultad para orinar o cambios en la cantidad de orina signos y sntomas de lesin al hgado, como orina amarilla oscura o [place]; sensacin general de estar enfermo o sntomas gripales; heces claras; prdida del apetito; nuseas; dolor en la regin abdominal superior derecha; cansancio o debilidad inusual; color amarillento de los ojos o la piel signos y sntomas de presin sangunea baja, tales como [place], sensacin de [place] o aturdimiento, cadas, cansancio o debilidad inusual dolor estomacal hinchazn de tobillos, pies, manos  sangrado o moretones inusuales vmito Efectos secundarios que generalmente no requieren atencin mdica (infrmelos a su mdico o a Barrister's clerk de la salud si persisten o si son molestos): dolor de Special educational needs teacher en las articulaciones calambres o dolores musculares nuseas cansancio Puede ser que esta lista no menciona todos los posibles efectos secundarios. Comunquese a su mdico por asesoramiento mdico [employer]. Usted puede informar los efectos secundarios a la FDA por telfono al 1-800-FDA-1088. Dnde debo guardar mi medicina? Este medicamento se administra en hospitales o clnicas y no necesitar guardarlo en su domicilio. ATENCIN: Este folleto es un resumen. Puede ser que no cubra toda la posible informacin. Si usted tiene preguntas acerca de esta medicina, consulte con su mdico, su farmacutico o su profesional de Technical sales engineer.  2020 Elsevier/Gold Standard (2018-08-23 00:00:00)  Pegfilgrastim injection Qu es este medicamento? El PEGFILGRASTIM es un factor estimulante de colonias de granulocitos de accin prolongada que estimula el crecimiento de los neutrfilos, un tipo de glbulo blanco importante en la lucha del cuerpo contra la infeccin. Se utiliza para reducir la incidencia de la fiebre y la infeccin en pacientes con ciertos tipos de cncer que reciben quimioterapia que afecta la mdula sea, y para aumentar la supervivencia despus de estar expuesto a altas dosis de radiacin. Este medicamento puede ser utilizado para otros usos; si tiene alguna pregunta consulte con su proveedor de atencin mdica o con su farmacutico. MARCAS COMUNES: Fulphila, Neulasta, UDENYCA, Ziextenzo Qu le debo informar a mi profesional de la salud antes de tomar este medicamento? Necesita saber si usted presenta alguno de los siguientes problemas o situaciones:  enfermedad renal  alergia al ltex  si actualmente recibe radioterapia  anemia drepanoctica  reacciones cutneas  a adhesivos acrlicos (  On-Body Injector solamente, su nombre en ingls)  una reaccin alrgica o inusual al pegfilgrastim, al filgrastim, a otros medicamentos, alimentos, colorantes o conservantes  si est embarazada o buscando quedar embarazada  si est amamantando a un beb Cmo debo utilizar este medicamento? Este medicamento se administra mediante una inyeccin por va subcutnea. Si recibe Coca-Cola en su casa, le ensearn cmo preparar y Architectural technologist la jeringa prellenada o cmo usar el inyector en el cuerpo (su nombre en ingls, On-body Injector). Consulte las instrucciones de uso para el paciente para obtener instrucciones detalladas. selo exactamente como se le indique. Informe de inmediato a su proveedor de atencin de la salud si sospecha que su inyector en el cuerpo (en ingls "On-body Injector") puede haber funcionado incorrectamente o si sospecha que el uso de su inyector en el cuerpo hizo que usted recibiera una dosis parcial o no recibiera una dosis. Es importante que deseche las agujas y las jeringas usadas en un recipiente resistente a los pinchazos. No los deseche en la basura. Si no tiene un recipiente resistente a los pinchazos, consulte a Midwife o proveedor de atencin de la salud para obtenerlo. Hable con su pediatra para informarse acerca del uso de este medicamento en nios. Aunque este medicamento se puede Advertising account executive, existen precauciones que deben tomarse. Sobredosis: Pngase en contacto inmediatamente con un centro toxicolgico o una sala de urgencia si usted cree que haya tomado demasiado medicamento. ATENCIN: [employer] es solo para usted. No comparta este medicamento con nadie. Qu sucede si me olvido de una dosis? Es importante no olvidar ninguna dosis. Comunquese con su mdico o profesional de la salud si se olvida una dosis. Si se olvida una dosis debido a un fallo del Journalist, newspaper cuerpo (su nombre en ingls, On-body  Injector). o fugas, una nueva dosis debe ser administrada tan pronto como sea posible usando una sola [place] prellenada para uso manual. Qu puede interactuar con este medicamento? No se han estudiado las interacciones. Puede ser que esta lista no menciona todas las posibles interacciones. Informe a su profesional de KB Home	[place] de [employer] Corporation productos a base de hierbas, medicamentos de [place] o suplementos nutritivos que est tomando. Si usted fuma, consume bebidas alcohlicas o si utiliza drogas ilegales, indqueselo tambin a su profesional de KB Home	[place]. Algunas sustancias pueden interactuar con su medicamento. A qu debo estar atento al usar Coca-Cola? Usted podr Psychologist, prison and probation services de sangre mientras est usando este medicamento. Si va a someterse a un IRM (MRI), tomografa computarizada u otro procedimiento, informe a su mdico que est usando este medicamento (su nombre en ingls, On-body Injector solamente). Qu efectos secundarios puedo tener al [employer] este medicamento? Efectos secundarios que debe informar a su mdico o a Barrister's clerk de la salud tan pronto como sea posible: Chief of Staff, como erupcin cutnea, comezn/picazn o urticaria, e hinchazn de la cara, los labios o la Holiday representative de espalda mareos fiebre Social research officer, government, enrojecimiento o Actor de la inyeccin puntos rojos en la piel orina roja o Forensic psychologist oscuro falta de aire o problemas respiratorios dolor de Paramedic o del costado, o Social research officer, government en el hombro hinchazn cansancio dificultad para Garment/textile technologist o cambios en el volumen de orina Efectos secundarios que generalmente no requieren atencin mdica (infrmelos a su mdico o a Barrister's clerk de la salud si persisten o si son molestos): dolor de Surveyor, quantity Puede ser que esta lista no menciona todos los posibles efectos secundarios. Comunquese a  su mdico por asesoramiento mdico [employer]. Usted puede informar los  efectos secundarios a la FDA por telfono al 1-800-FDA-1088. Dnde debo guardar mi medicina? Mantenga fuera del alcance de los nios. Si est News Corporation en su casa, le indicarn cmo guardarlo. Deseche todo el medicamento que no haya utilizado despus de la fecha de vencimiento indicada en la etiqueta. ATENCIN: Este folleto es un resumen. Puede ser que no cubra toda la posible informacin. Si usted tiene preguntas acerca de esta medicina, consulte con su mdico, su farmacutico o su profesional de Technical sales engineer.  2020 Elsevier/Gold Standard (2017-10-19 00:00:00)  Coronavirus (COVID-19) Are you at risk?  Are you at risk for the Coronavirus (COVID-19)?  To be considered HIGH RISK for Coronavirus (COVID-19), you have to meet the following criteria:  . Traveled to [place], [place], [place], [place] or [place]; or in the [place] to [place], [place], [place], or [place]; and have fever, cough, and shortness of breath within the last 2 weeks of travel OR . Been in close contact with a person diagnosed with COVID-19 within the last 2 weeks and have fever, cough, and shortness of breath . IF YOU DO NOT MEET THESE CRITERIA, YOU ARE CONSIDERED LOW RISK FOR COVID-19.  What to do if you are HIGH RISK for COVID-19?  Marland Kitchen If you are having a medical emergency, call 911. . Seek medical care right away. Before you go to a doctor's office, urgent care or emergency department, call ahead and tell them about your recent travel, contact with someone diagnosed with COVID-19, and your symptoms. You should receive instructions from your physician's office regarding next steps of care.  . When you arrive at healthcare provider, tell the healthcare staff immediately you have returned from visiting [place], [place], [place], [place] or [place]; or traveled in the [place] to [place], [place], [place], or [place]; in the last two weeks or you have been in close contact with a person diagnosed  with COVID-19 in the last 2 weeks.   . Tell the health care staff about your symptoms: fever, cough and shortness of breath. . After you have been seen by a medical provider, you will be either: o Tested for (COVID-19) and discharged home on quarantine except to seek medical care if symptoms worsen, and asked to  - Stay home and avoid contact with others until you get your results (4-5 days)  - Avoid travel on public transportation if possible (such as bus, train, or airplane) or o Sent to the Emergency Department by EMS for evaluation, COVID-19 testing, and possible admission depending on your condition and test results.  What to do if you are LOW RISK for COVID-19?  Reduce your risk of any infection by using the same precautions used for avoiding the common cold or flu:  Marland Kitchen Wash your hands often with soap and warm water for at least 20 seconds.  If soap and water are not readily available, use an alcohol-based hand sanitizer with at least 60% alcohol.  . If coughing or sneezing, cover your mouth and nose by coughing or sneezing into the elbow areas of your shirt or coat, into a tissue or into your sleeve (not your hands). . Avoid shaking hands with others and consider head nods or verbal greetings only. . Avoid touching your eyes, nose, or mouth with unwashed hands.  . Avoid close contact with people who are sick. . Avoid places or events with large  numbers of people in one location, like concerts or sporting events. . Carefully consider travel plans you have or are making. . If you are planning any travel outside or inside the [place], visit the CDC's Travelers' Health webpage for the latest health notices. . If you have some symptoms but not all symptoms, continue to monitor at home and seek medical attention if your symptoms worsen. . If you are having a medical emergency, call 911.   [place] / e-Visit:  [email or web address]         [facility]: [facility]: [phone]                   [facility]: [phone]

## 2019-01-17 NOTE — Progress Notes (Signed)
Received a call from patient's daughter stating she has asked the [place] office to fill out and complete short term disability paperwork. She believes she got a message today from that office stating that they don't complete those form.  I reached out to Dr Grier Mitts nurse, Erskine Emery, and she doesn't have a record of patient making this request. Paperwork was received this past Monday, but it was for [employer]. She requested that I give Yasmin the number to [facility], who filled out this prior paperwork and follow up with her.   Called Yasmin back and gave her Lori's phone number - [phone].

## 2019-01-19 ENCOUNTER — Telehealth: Payer: Self-pay | Admitting: Hematology

## 2019-01-19 NOTE — Telephone Encounter (Signed)
Faxed medical records to: Russ Halo: [phone] P: [phone]  for   [place] [employer] #[id number] #[id number] 10-01-1960 10/18/2018 - 01/17/2019      COPY SCANNED

## 2019-01-20 ENCOUNTER — Other Ambulatory Visit: Payer: Self-pay | Admitting: Oncology

## 2019-01-27 ENCOUNTER — Other Ambulatory Visit: Payer: Self-pay

## 2019-01-27 ENCOUNTER — Inpatient Hospital Stay: Payer: Commercial Managed Care - PPO | Admitting: Hematology

## 2019-01-27 ENCOUNTER — Emergency Department (HOSPITAL_COMMUNITY): Payer: Commercial Managed Care - PPO

## 2019-01-27 ENCOUNTER — Encounter (HOSPITAL_COMMUNITY): Payer: Self-pay | Admitting: Emergency Medicine

## 2019-01-27 ENCOUNTER — Inpatient Hospital Stay: Payer: Commercial Managed Care - PPO

## 2019-01-27 ENCOUNTER — Emergency Department (HOSPITAL_COMMUNITY)
Admission: EM | Admit: 2019-01-27 | Discharge: 2019-01-27 | Disposition: A | Discharge status: Home / Self Care | Payer: Commercial Managed Care - PPO | Attending: Emergency Medicine | Admitting: Emergency Medicine

## 2019-01-27 ENCOUNTER — Telehealth: Payer: Self-pay | Admitting: *Deleted

## 2019-01-27 DIAGNOSIS — R42 Dizziness and giddiness: Secondary | ICD-10-CM | POA: Insufficient documentation

## 2019-01-27 DIAGNOSIS — Z20828 Contact with and (suspected) exposure to other viral communicable diseases: Secondary | ICD-10-CM | POA: Insufficient documentation

## 2019-01-27 DIAGNOSIS — I10 Essential (primary) hypertension: Secondary | ICD-10-CM | POA: Diagnosis not present

## 2019-01-27 DIAGNOSIS — R112 Nausea with vomiting, unspecified: Secondary | ICD-10-CM | POA: Diagnosis not present

## 2019-01-27 DIAGNOSIS — Z79899 Other long term (current) drug therapy: Secondary | ICD-10-CM | POA: Diagnosis not present

## 2019-01-27 LAB — COMPREHENSIVE METABOLIC PANEL
ALT: 26 U/L (ref 0–44)
AST: 19 U/L (ref 15–41)
Albumin: 4 g/dL (ref 3.5–5.0)
Alkaline Phosphatase: 124 U/L (ref 38–126)
Anion gap: 12 (ref 5–15)
BUN: 12 mg/dL (ref 6–20)
CO2: 23 mmol/L (ref 22–32)
Calcium: 8.5 mg/dL — ABNORMAL LOW (ref 8.9–10.3)
Chloride: 102 mmol/L (ref 98–111)
Creatinine, Ser: 0.58 mg/dL (ref 0.44–1.00)
GFR calc Af Amer: >60 mL/min (ref >60)
GFR calc non Af Amer: >60 mL/min (ref >60)
Glucose, Bld: 138 mg/dL — ABNORMAL HIGH (ref 70–99)
Potassium: 3.4 mmol/L — ABNORMAL LOW (ref 3.5–5.1)
Sodium: 137 mmol/L (ref 135–145)
Total Bilirubin: 0.6 mg/dL (ref 0.3–1.2)
Total Protein: 6.8 g/dL (ref 6.5–8.1)

## 2019-01-27 LAB — URINALYSIS, ROUTINE W REFLEX MICROSCOPIC
Bilirubin Urine: NEGATIVE
Glucose, UA: NEGATIVE mg/dL
Hgb urine dipstick: NEGATIVE
Ketones, ur: NEGATIVE mg/dL
Leukocytes,Ua: NEGATIVE
Nitrite: NEGATIVE
Protein, ur: NEGATIVE mg/dL
Specific Gravity, Urine: 1.01 (ref 1.005–1.030)
pH: 7 (ref 5.0–8.0)

## 2019-01-27 LAB — CBC
HCT: 32.5 % — ABNORMAL LOW (ref 36.0–46.0)
Hemoglobin: 10.3 g/dL — ABNORMAL LOW (ref 12.0–15.0)
MCH: 28.3 pg (ref 26.0–34.0)
MCHC: 31.7 g/dL (ref 30.0–36.0)
MCV: 89.3 fL (ref 80.0–100.0)
Platelets: 154 10*3/uL (ref 150–400)
RBC: 3.64 MIL/uL — ABNORMAL LOW (ref 3.87–5.11)
RDW: 22.9 % — ABNORMAL HIGH (ref 11.5–15.5)
WBC: 14.5 10*3/uL — ABNORMAL HIGH (ref 4.0–10.5)
nRBC: 0.2 % (ref 0.0–0.2)

## 2019-01-27 LAB — SARS CORONAVIRUS 2 (TAT 6-24 HRS): SARS Coronavirus 2: NEGATIVE

## 2019-01-27 LAB — LIPASE, BLOOD: Lipase: 22 U/L (ref 11–51)

## 2019-01-27 MED ORDER — CHLORHEXIDINE GLUCONATE CLOTH 2 % EX PADS: 6.0000 | MEDICATED_PAD | Freq: Every day | CUTANEOUS | Status: DC

## 2019-01-27 MED ORDER — DIAZEPAM 2 MG PO TABS
2.0000 mg | ORAL_TABLET | Freq: Once | ORAL | Status: AC
  Administered 2019-01-27: 2 mg via ORAL
  Filled 2019-01-27: qty 1

## 2019-01-27 MED ORDER — MECLIZINE HCL 25 MG PO TABS: 25.0000 mg | ORAL_TABLET | Freq: Three times a day (TID) | ORAL | 0 refills | Status: DC | PRN

## 2019-01-27 MED ORDER — HEPARIN SOD (PORK) LOCK FLUSH 100 UNIT/ML IV SOLN
500.0000 [IU] | Freq: Once | INTRAVENOUS | Status: AC
  Administered 2019-01-27: 500 [IU]
  Filled 2019-01-27: qty 5

## 2019-01-27 MED ORDER — ONDANSETRON 4 MG PO TBDP
4.0000 mg | ORAL_TABLET | Freq: Once | ORAL | Status: AC | PRN
  Administered 2019-01-27: 12:00:00 4 mg via ORAL
  Filled 2019-01-27: qty 1

## 2019-01-27 MED ORDER — SODIUM CHLORIDE 0.9% FLUSH: 10.0000 mL | INTRAVENOUS | Status: DC | PRN

## 2019-01-27 MED ORDER — GADOBUTROL 1 MMOL/ML IV SOLN
6.0000 mL | Freq: Once | INTRAVENOUS | Status: AC | PRN
  Administered 2019-01-27: 18:00:00 6 mL via INTRAVENOUS

## 2019-01-27 MED ORDER — SODIUM CHLORIDE 0.9% FLUSH
3.0000 mL | Freq: Once | INTRAVENOUS | Status: AC
  Administered 2019-01-27: 3 mL via INTRAVENOUS

## 2019-01-27 MED ORDER — MECLIZINE HCL 25 MG PO TABS
25.0000 mg | ORAL_TABLET | Freq: Once | ORAL | Status: AC
  Administered 2019-01-27: 25 mg via ORAL
  Filled 2019-01-27: qty 1

## 2019-01-27 NOTE — ED Notes (Signed)
Pt reports that she had chemo last Aug. 24.

## 2019-01-27 NOTE — Telephone Encounter (Signed)
Patient daughter Hart Rochester called - mother in [facility] ED with sweating and nausea since earlier. She will not be here for appt. Dr. Irene Limbo informed. Per Dr. Irene Limbo, advise daughter that patient needs to be evaluated for current symptoms. Today's appts will be cancelled. Scheduling will contact to make additional appts.  Daughter informed, verbalized understanding.

## 2019-01-27 NOTE — ED Notes (Signed)
Patient ambulated with assistance by writer and patients daughter to the restroom and back to the stretcher. Patient states she is mildly dizzy, but thinks she will be safe at home with spouse.

## 2019-01-27 NOTE — ED Provider Notes (Signed)
Received patient at signout from [facility].  Refer to provider note for full history and physical examination.  Briefly, patient is a 58 year old female with history of Crohn's disease, diffuse large B-cell lymphoma presenting for evaluation of dizziness and nausea which began today, felt her ear pop yesterday.  Had one episode of nonbloody nonbilious emesis in the ED today, none since then.  Currently pending brain MRI and COVID test.  Dizziness sounds peripherally vertiginous in nature.  Will ambulate patient.  If MRI unremarkable and patient ambulatory without difficulty she will likely be stable for discharge home.  Physical Exam  BP (!) 148/99   Pulse 78   Temp 97.7 F (36.5 C) (Oral)   Resp 16   Ht 5' (1.524 m)   Wt 59.9 kg   LMP  (LMP Unknown)   SpO2 100%   BMI 25.78 kg/m   Physical Exam Vitals signs and nursing note reviewed.  Constitutional:      General: She is not in acute distress.    Appearance: She is well-developed.     Comments: Resting comfortably in bed, appears older than stated age  HENT:     Head: Normocephalic and atraumatic.  Eyes:     General:        Right eye: No discharge.        Left eye: No discharge.     Conjunctiva/sclera: Conjunctivae normal.  Neck:     Vascular: No JVD.     Trachea: No tracheal deviation.  Cardiovascular:     Rate and Rhythm: Normal rate.  Pulmonary:     Effort: Pulmonary effort is normal.  Abdominal:     General: There is no distension.  Skin:    Findings: No erythema.  Neurological:     Mental Status: She is alert.  Psychiatric:        Behavior: Behavior normal.      MDM  Mr Jeri Cos Wo Contrast  Result Date: 01/27/2019 CLINICAL DATA:  Vertigo episodic. History of lymphoma. Malignant pleural effusion. Crohn's disease. EXAM: MRI HEAD WITHOUT AND WITH CONTRAST TECHNIQUE: Multiplanar, multiecho pulse sequences of the brain and surrounding structures were obtained without and with intravenous contrast. CONTRAST:  13m  GADAVIST GADOBUTROL 1 MMOL/ML IV SOLN COMPARISON:  None. FINDINGS: Brain: On the initial scan, the tech inadvertently did not perform coronal postcontrast imaging. The patient returned for postcontrast sagittal coronal and axial imaging as there was question of enhancing lesion in the left anterior putamen on the initial study. This area is not seen on repeat imaging and is felt to be artifact on the initial study. No enhancing mass lesion identified. Ventricle size normal. Cerebral volume normal. Negative for acute infarct. Mild periventricular white matter hyperintensity bilaterally appears chronic. No hemorrhage mass or edema. Vascular: Normal arterial flow voids. Skull and upper cervical spine: Negative Sinuses/Orbits: Mucosal edema paranasal sinuses.  Normal orbit Other: None IMPRESSION: No acute abnormality.  No acute infarct or mass Repeat imaging fails to confirm a small enhancing lesion left anterior putamen which is felt to be an artifact on the initial axial sequence postcontrast. Electronically Signed   By: CFranchot GalloM.D.   On: 01/27/2019 19:22   Imaging reassuring.  Patient ambulated in the ED without much difficulty after administration of meclizine reports she is feeling better.  She feels comfortable with discharge home.  Will discharge with course of meclizine, advised of side effects.  Recommend follow-up with PCP for reevaluation of symptoms.  Discussed strict ED return precautions.  Patient and daughter verbalized understanding of and agreement with plan and patient stable for discharge home at this time.  Daughter served as Optometrist throughout Cabin crew.       Debroah Baller 01/27/19 2049    Virgel Manifold, MD 01/30/19 1356

## 2019-01-27 NOTE — ED Notes (Signed)
Attempted to ambulate pt, Pt reports that she was feeling as if she was drunk, and continued to have dizziness.

## 2019-01-27 NOTE — ED Provider Notes (Signed)
[facility] DEPT Provider Note   CSN: [id number] Arrival date & time: 01/27/19  0845     History   Chief Complaint Chief Complaint  Patient presents with   Nausea   Dizziness   Emesis    HPI Margaretha Yuette Putnam is a 58 y.o. female.     58 y.o female with a PMH of Chron's Disease, Diffuse Large B cell lymphoma presents to the ED with a chief complaint of dizziness x this morning. Patient reports she woke up this morning to ambulate to the bathroom when she felt the rooms spinning, states she laid down and the symptoms improved. Patient attempted to go back to bed when she stood up once again her symptoms return, she reports the dizziness is worse with movement of her head, improved by lying down.  She has not tried any medication for improvement in symptoms.  Patient states "I feel like I had a couple beers and it may be drunk ".  She also endorses nausea, vomiting.  Patient also reports taking a shower yesterday and began to feel ringing of her left ear.  She states her symptoms somewhat improve upon arrival into the ED. She denies any headaches, shortness of breath, or chest pain.   The history is provided by the patient and medical records. No language interpreter was used.  Dizziness Quality:  Head spinning, vertigo and room spinning Severity:  Moderate Onset quality:  Sudden Duration:  3 hours Timing:  Intermittent Progression:  Unchanged Chronicity:  New Context: ear pain and head movement   Relieved by:  Being still and closing eyes Associated symptoms: nausea, tinnitus and vomiting   Associated symptoms: no shortness of breath   Emesis Associated symptoms: no abdominal pain, no fever and no sore throat     Past Medical History:  Diagnosis Date   Acute cystitis    Anxiety    Arthritis    Crohn disease ([place])    dx 2004, history of small bowel obstruction 02/2007 treated conservatively   History of colon polyps    History  of shingles    HTN (hypertension)    Hypercholesterolemia    Insomnia    Migraine    Uterine fibroid     Patient Active Problem List   Diagnosis Date Noted   Encounter for antineoplastic chemotherapy    Diffuse large B cell lymphoma ([place]) 01/10/2019   Chemotherapy-induced nausea 01/04/2019   Hyponatremia 12/28/2018   Neutropenia due to and not concurrent with chemotherapy ([place]) 12/28/2018   Crohn disease ([place]) 12/28/2018   Essential hypertension 12/28/2018   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 Otherwise normal colonoscopy to terminal ileum   COLONOSCOPY  10/02/2017   ESOPHAGOGASTRODUODENOSCOPY  04/05/2007   Mild gastritis. Otherwise, normal esophagogastroduodenoscopy   IR IMAGING GUIDED PORT INSERTION  11/29/2018   LYMPH NODE BIOPSY Left 12/09/2018   Procedure: LEFT DEEP CERVICAL LYMPH NODE BIOPSY;  Surgeon: Fanny Skates, MD;  Location: [facility];  Service: General;  Laterality: Left;   TUBAL LIGATION       OB History   No obstetric history on file.      Home Medications    Prior to Admission medications   Medication Sig Start Date End Date Taking? Authorizing Provider  calcium carbonate (TUMS - DOSED IN MG ELEMENTAL  CALCIUM) 500 MG chewable tablet Chew 2 tablets by mouth daily as needed for indigestion or heartburn.   Yes [provider]  cholecalciferol (VITAMIN D3) 25 MCG (1000 UT) tablet Take 1,000 Units by mouth daily.   Yes [provider]  HYDROcodone-acetaminophen (NORCO) 5-325 MG tablet Take 1-2 tablets by mouth every 6 (six) hours as needed for moderate pain or severe pain. 12/09/18  Yes Fanny Skates, MD  lidocaine-prilocaine (EMLA) cream Apply to affected area once Patient taking differently: Apply 1 application topically as  needed (For port-a-cath.).  12/14/18  Yes Tish Men, MD  mesalamine (LIALDA) 1.2 g EC tablet Take 2.4 g by mouth daily.  05/15/17  Yes [provider]  Nutritional Supplements (IMMUNE ENHANCE) TABS Take 1 tablet by mouth daily.   Yes [provider]  ondansetron (ZOFRAN) 8 MG tablet Take 1 tablet (8 mg total) by mouth 2 (two) times daily as needed for refractory nausea / vomiting. Start on day 3 after cyclophosphamide chemotherapy. 12/14/18  Yes Tish Men, MD  OVER THE COUNTER MEDICATION Take 2 tablets by mouth daily as needed (acid reflux). Soothing digestive relief otc   Yes [provider]  polyethylene glycol (MIRALAX / GLYCOLAX) 17 g packet Take 17 g by mouth daily as needed for mild constipation.   Yes [provider]  potassium chloride SA (K-DUR) 20 MEQ tablet Take 1 tablet (20 mEq total) by mouth daily. 12/31/18 01/30/19 Yes Tish Men, MD  Prenatal Vit-Fe Fumarate-FA (MULTIVITAMIN-PRENATAL) 27-0.8 MG TABS tablet Take 1 tablet by mouth daily at 12 noon.   Yes [provider]  traZODone (DESYREL) 50 MG tablet Take 1 tablet (50 mg total) by mouth at bedtime as needed for sleep. Patient taking differently: Take 25 mg by mouth at bedtime as needed for sleep.  12/21/18 01/27/19 Yes Tish Men, MD  Melatonin 3 MG TABS Take 3 mg by mouth at bedtime as needed (sleep).    [provider]    Family History Family History  Problem Relation Age of Onset   Colon cancer Neg Hx     Social History Social History   Tobacco Use   Smoking status: Never Smoker   Smokeless tobacco: Never Used  Substance Use Topics   Alcohol use: Never    Frequency: Never   Drug use: Never     Allergies   Nsaids   Review of Systems Review of Systems  Constitutional: Negative for fever.  HENT: Positive for tinnitus. Negative for ear discharge, ear pain, facial swelling, rhinorrhea, sinus pressure, sinus pain, sneezing and sore throat.   Respiratory: Negative  for shortness of breath.   Gastrointestinal: Positive for nausea and vomiting. Negative for abdominal pain.  Genitourinary: Negative for flank pain.  Neurological: Positive for dizziness. Negative for light-headedness.     Physical Exam Updated Vital Signs BP (!) 148/99    Pulse 78    Temp 97.7 F (36.5 C) (Oral)    Resp 16    Ht 5' (1.524 m)    Wt 59.9 kg    LMP  (LMP Unknown)    SpO2 100%    BMI 25.78 kg/m   Physical Exam Vitals signs and nursing note reviewed.  Constitutional:      Appearance: Normal appearance.  HENT:     Head: Normocephalic and atraumatic.     Right Ear: Tympanic membrane and ear canal normal.     Left Ear: Tympanic membrane and ear canal normal.     Mouth/Throat:  Mouth: Mucous membranes are dry.  Eyes:     Extraocular Movements: Extraocular movements intact.     Right eye: No nystagmus.     Left eye: No nystagmus.     Conjunctiva/sclera: Conjunctivae normal.     Right eye: Right conjunctiva is not injected.     Left eye: Left conjunctiva is not injected.     Pupils: Pupils are equal, round, and reactive to light.  Neck:     Musculoskeletal: Normal range of motion and neck supple.  Cardiovascular:     Rate and Rhythm: Normal rate.     Heart sounds: No murmur.  Pulmonary:     Effort: Pulmonary effort is normal.     Breath sounds: Normal breath sounds. No wheezing or rhonchi.     Comments: Lungs are clear to auscultation.  Abdominal:     General: Bowel sounds are normal. There is no distension.     Tenderness: There is no abdominal tenderness. There is no right CVA tenderness or left CVA tenderness.     Hernia: No hernia is present.     Comments: Abdomen is soft, non tender with present bowel sounds.   Musculoskeletal:     Right lower leg: No edema.     Left lower leg: No edema.  Neurological:     Mental Status: She is alert.      ED Treatments / Results  Labs (all labs ordered are listed, but only abnormal results are displayed) Labs  Reviewed  COMPREHENSIVE METABOLIC PANEL - Abnormal; Notable for the following components:      Result Value   Potassium 3.4 (*)    Glucose, Bld 138 (*)    Calcium 8.5 (*)    All other components within normal limits  CBC - Abnormal; Notable for the following components:   WBC 14.5 (*)    RBC 3.64 (*)    Hemoglobin 10.3 (*)    HCT 32.5 (*)    RDW 22.9 (*)    All other components within normal limits  SARS CORONAVIRUS 2 (TAT 6-24 HRS)  LIPASE, BLOOD  URINALYSIS, ROUTINE W REFLEX MICROSCOPIC    EKG None  Radiology No results found.  Procedures Procedures (including critical care time)  Medications Ordered in ED Medications  meclizine (ANTIVERT) tablet 25 mg (has no administration in time range)  sodium chloride flush (NS) 0.9 % injection 3 mL (3 mLs Intravenous Given 01/27/19 1150)  ondansetron (ZOFRAN-ODT) disintegrating tablet 4 mg (4 mg Oral Given 01/27/19 1149)  diazepam (VALIUM) tablet 2 mg (2 mg Oral Given 01/27/19 1149)     Initial Impression / Assessment and Plan / ED Course  I have reviewed the triage vital signs and the nursing notes.  Pertinent labs & imaging results that were available during my care of the patient were reviewed by me and considered in my medical decision making (see chart for details).      Echo history of CAD presents to the ED with complaints of sudden onset of dizziness this morning while standing up to go to the restroom, she reports the dizziness is worse when she moves her head, sits up, stands up or tries to ambulate.  She denies any previous history of vertigo.  Denies any headache, she does endorse some left ear pain that began last night after her shower.  CMP showed slight hypokalemia, LFTs are within normal limits.  CBC is remarkable for leukocytosis of 14.5, this is increased from patient's previous visit.  UA is negative for  any nitrites, leukocytes she denies any urinary symptoms at this time.  Patient does not appointment with Dr.  Irene Limbo oncologist this evening to further evaluate her treatment options. Patient received 2 mg of Valium p.o., upon reassessment patient does not report much improvement.  She was given Zofran to help with the nausea, does report nausea has somewhat improved.  Attempted to contact patient's oncologist Dr. Irene Limbo without success, will obtain Kopit testing as her appointment was canceled this evening and she likely will not be able to get it rescheduled without a Kopit test.   On my evaluation there is no nystagmus, patient is neurovascularly intact.  Low suspicion for central etiology.  However, patient has a complicated medical history, will order MRI to further evaluate her symptoms.  She will also be given meclizine 25 mg to help with her symptoms.  4:47 PM Nursing staff attempted to walk patient, she reports "estoy borracha", this translating English means that she likely feels drunk.  She does report she feels the room continues to spin.  Case was discussed with Dr. Francia Greaves who agrees with patient having MRI brain at this time.  Patient was explained full plan in Spanish, she agrees with management.  Care will be signed out to incoming provider Rembrandt PA pending MRI brain.  Suspect of MRI brain is negative, and patient can ambulate with a steady gait she likely will be going home for disposition.   Portions of this note were generated with [employer]. Dictation errors may occur despite best attempts at proofreading.  Final Clinical Impressions(s) / ED Diagnoses   Final diagnoses:  Dizziness    ED Discharge Orders    None       Janeece Fitting, PA-C 01/27/19 1708    Daleen Bo, MD 01/27/19 1735

## 2019-01-27 NOTE — Discharge Instructions (Addendum)
Los [employer] de su laboratorio estuvieron normales hoy. Hicimos un MRI de su cerebro para asegurarnos que no habia algun infarcto o derrame.  Obtenie un sample de su nariz para hacerle la prueba de covid 19, ya que no podra volver a su oncologo sin ella.   Si su prueba del MRI es positiva se va a quedar en el hospital, hasta que sus simptomas mejoren.

## 2019-01-28 ENCOUNTER — Telehealth: Payer: Self-pay

## 2019-01-28 NOTE — Telephone Encounter (Signed)
Per Dr. Irene Limbo, patient needs to be set up for inpatient chemo admission for 5 days starting Monday 9/14. Spoke to [name] in bed placement and made request for inpatient chemo bed. Also spoke to patient's daughter Denise Walls to make her aware of upcoming admission and  to expect a phone call from bed placement Monday morning. She verbalized understanding. Message also sent to inpatient chemotherapy group to make everyone aware.

## 2019-01-31 ENCOUNTER — Other Ambulatory Visit: Payer: Self-pay

## 2019-01-31 ENCOUNTER — Inpatient Hospital Stay (HOSPITAL_COMMUNITY)
Admission: AD | Admit: 2019-01-31 | Discharge: 2019-02-04 | DRG: 847 | Disposition: A | Discharge status: Home / Self Care | Payer: Commercial Managed Care - PPO | Source: Ambulatory Visit | Attending: Hematology | Admitting: Hematology

## 2019-01-31 ENCOUNTER — Encounter (HOSPITAL_COMMUNITY): Payer: Self-pay | Admitting: Oncology

## 2019-01-31 DIAGNOSIS — E876 Hypokalemia: Secondary | ICD-10-CM | POA: Diagnosis present

## 2019-01-31 DIAGNOSIS — E871 Hypo-osmolality and hyponatremia: Secondary | ICD-10-CM | POA: Diagnosis present

## 2019-01-31 DIAGNOSIS — Z20828 Contact with and (suspected) exposure to other viral communicable diseases: Secondary | ICD-10-CM | POA: Diagnosis present

## 2019-01-31 DIAGNOSIS — D649 Anemia, unspecified: Secondary | ICD-10-CM | POA: Diagnosis not present

## 2019-01-31 DIAGNOSIS — C8330 Diffuse large B-cell lymphoma, unspecified site: Secondary | ICD-10-CM | POA: Diagnosis present

## 2019-01-31 DIAGNOSIS — K5900 Constipation, unspecified: Secondary | ICD-10-CM | POA: Diagnosis present

## 2019-01-31 DIAGNOSIS — D638 Anemia in other chronic diseases classified elsewhere: Secondary | ICD-10-CM | POA: Diagnosis present

## 2019-01-31 DIAGNOSIS — T451X5A Adverse effect of antineoplastic and immunosuppressive drugs, initial encounter: Secondary | ICD-10-CM | POA: Diagnosis present

## 2019-01-31 DIAGNOSIS — T380X5A Adverse effect of glucocorticoids and synthetic analogues, initial encounter: Secondary | ICD-10-CM | POA: Diagnosis present

## 2019-01-31 DIAGNOSIS — I10 Essential (primary) hypertension: Secondary | ICD-10-CM | POA: Diagnosis present

## 2019-01-31 DIAGNOSIS — E785 Hyperlipidemia, unspecified: Secondary | ICD-10-CM | POA: Diagnosis present

## 2019-01-31 DIAGNOSIS — C8339 Diffuse large B-cell lymphoma, extranodal and solid organ sites: Secondary | ICD-10-CM | POA: Diagnosis not present

## 2019-01-31 DIAGNOSIS — D72829 Elevated white blood cell count, unspecified: Secondary | ICD-10-CM | POA: Diagnosis present

## 2019-01-31 DIAGNOSIS — Z5111 Encounter for antineoplastic chemotherapy: Principal | ICD-10-CM

## 2019-01-31 DIAGNOSIS — D6481 Anemia due to antineoplastic chemotherapy: Secondary | ICD-10-CM | POA: Diagnosis present

## 2019-01-31 DIAGNOSIS — E7800 Pure hypercholesterolemia, unspecified: Secondary | ICD-10-CM | POA: Diagnosis present

## 2019-01-31 LAB — COMPREHENSIVE METABOLIC PANEL
ALT: 18 U/L (ref 0–44)
AST: 15 U/L (ref 15–41)
Albumin: 3.6 g/dL (ref 3.5–5.0)
Alkaline Phosphatase: 85 U/L (ref 38–126)
Anion gap: 11 (ref 5–15)
BUN: 11 mg/dL (ref 6–20)
CO2: 24 mmol/L (ref 22–32)
Calcium: 8.7 mg/dL — ABNORMAL LOW (ref 8.9–10.3)
Chloride: 99 mmol/L (ref 98–111)
Creatinine, Ser: 0.68 mg/dL (ref 0.44–1.00)
GFR calc Af Amer: >60 mL/min (ref >60)
GFR calc non Af Amer: >60 mL/min (ref >60)
Glucose, Bld: 140 mg/dL — ABNORMAL HIGH (ref 70–99)
Potassium: 3.6 mmol/L (ref 3.5–5.1)
Sodium: 134 mmol/L — ABNORMAL LOW (ref 135–145)
Total Bilirubin: 0.6 mg/dL (ref 0.3–1.2)
Total Protein: 6.5 g/dL (ref 6.5–8.1)

## 2019-01-31 LAB — CBC WITH DIFFERENTIAL/PLATELET
Abs Immature Granulocytes: 0.05 10*3/uL (ref 0.00–0.07)
Basophils Absolute: 0.1 10*3/uL (ref 0.0–0.1)
Basophils Relative: 1 %
Eosinophils Absolute: 0 10*3/uL (ref 0.0–0.5)
Eosinophils Relative: 0 %
HCT: 31.1 % — ABNORMAL LOW (ref 36.0–46.0)
Hemoglobin: 9.7 g/dL — ABNORMAL LOW (ref 12.0–15.0)
Immature Granulocytes: 1 %
Lymphocytes Relative: 8 %
Lymphs Abs: 0.7 10*3/uL (ref 0.7–4.0)
MCH: 28.1 pg (ref 26.0–34.0)
MCHC: 31.2 g/dL (ref 30.0–36.0)
MCV: 90.1 fL (ref 80.0–100.0)
Monocytes Absolute: 0.6 10*3/uL (ref 0.1–1.0)
Monocytes Relative: 7 %
Neutro Abs: 7.1 10*3/uL (ref 1.7–7.7)
Neutrophils Relative %: 83 %
Platelets: 373 10*3/uL (ref 150–400)
RBC: 3.45 MIL/uL — ABNORMAL LOW (ref 3.87–5.11)
RDW: 22 % — ABNORMAL HIGH (ref 11.5–15.5)
WBC: 8.1 10*3/uL (ref 4.0–10.5)
nRBC: 0 % (ref 0.0–0.2)

## 2019-01-31 LAB — URIC ACID: Uric Acid, Serum: 3.9 mg/dL (ref 2.5–7.1)

## 2019-01-31 LAB — PHOSPHORUS: Phosphorus: 4.9 mg/dL — ABNORMAL HIGH (ref 2.5–4.6)

## 2019-01-31 LAB — MAGNESIUM: Magnesium: 2 mg/dL (ref 1.7–2.4)

## 2019-01-31 MED ORDER — HYDROCODONE-ACETAMINOPHEN 5-325 MG PO TABS: 1.0000 | ORAL_TABLET | Freq: Four times a day (QID) | ORAL | Status: DC | PRN

## 2019-01-31 MED ORDER — ENOXAPARIN SODIUM 40 MG/0.4ML ~~LOC~~ SOLN
40.0000 mg | SUBCUTANEOUS | Status: DC
  Administered 2019-01-31 – 2019-02-04 (×5): 40 mg via SUBCUTANEOUS
  Filled 2019-01-31 (×5): qty 0.4

## 2019-01-31 MED ORDER — MECLIZINE HCL 25 MG PO TABS: 25.0000 mg | ORAL_TABLET | Freq: Three times a day (TID) | ORAL | Status: DC | PRN

## 2019-01-31 MED ORDER — VITAMIN D 25 MCG (1000 UNIT) PO TABS
1000.0000 [IU] | ORAL_TABLET | Freq: Every day | ORAL | Status: DC
  Administered 2019-02-01 – 2019-02-04 (×4): 1000 [IU] via ORAL
  Filled 2019-01-31 (×4): qty 1

## 2019-01-31 MED ORDER — TRAZODONE HCL 50 MG PO TABS
25.0000 mg | ORAL_TABLET | Freq: Every evening | ORAL | Status: DC | PRN
  Filled 2019-01-31: qty 1

## 2019-01-31 MED ORDER — VINCRISTINE SULFATE CHEMO INJECTION 1 MG/ML
Freq: Once | INTRAVENOUS | Status: AC
  Administered 2019-01-31: 19:00:00 via INTRAVENOUS
  Filled 2019-01-31: qty 8

## 2019-01-31 MED ORDER — SODIUM CHLORIDE 0.9% FLUSH
10.0000 mL | Freq: Two times a day (BID) | INTRAVENOUS | Status: DC
  Administered 2019-01-31 – 2019-02-04 (×4): 10 mL

## 2019-01-31 MED ORDER — PREDNISONE 20 MG PO TABS
60.0000 mg | ORAL_TABLET | Freq: Every day | ORAL | Status: AC
  Administered 2019-01-31 – 2019-02-04 (×5): 60 mg via ORAL
  Filled 2019-01-31 (×5): qty 3

## 2019-01-31 MED ORDER — MELATONIN 3 MG PO TABS
3.0000 mg | ORAL_TABLET | Freq: Every evening | ORAL | Status: DC | PRN
  Filled 2019-01-31: qty 1

## 2019-01-31 MED ORDER — PROCHLORPERAZINE EDISYLATE 10 MG/2ML IJ SOLN: 10.0000 mg | Freq: Four times a day (QID) | INTRAMUSCULAR | Status: DC | PRN

## 2019-01-31 MED ORDER — LIDOCAINE-PRILOCAINE 2.5-2.5 % EX CREA: 1.0000 "application " | TOPICAL_CREAM | CUTANEOUS | Status: DC | PRN

## 2019-01-31 MED ORDER — MESALAMINE 1.2 G PO TBEC
2.4000 g | DELAYED_RELEASE_TABLET | Freq: Every day | ORAL | Status: DC
  Administered 2019-01-31 – 2019-02-04 (×5): 2.4 g via ORAL
  Filled 2019-01-31 (×5): qty 2

## 2019-01-31 MED ORDER — CHLORHEXIDINE GLUCONATE CLOTH 2 % EX PADS
6.0000 | MEDICATED_PAD | Freq: Every day | CUTANEOUS | Status: DC
  Administered 2019-01-31: 17:00:00 6 via TOPICAL

## 2019-01-31 MED ORDER — SODIUM CHLORIDE 0.9 % IV SOLN
Freq: Once | INTRAVENOUS | Status: AC
  Administered 2019-01-31: 17:00:00 8 mg via INTRAVENOUS
  Filled 2019-01-31: qty 4

## 2019-01-31 MED ORDER — PRENATAL PLUS 27-1 MG PO TABS
1.0000 | ORAL_TABLET | Freq: Every day | ORAL | Status: DC
  Administered 2019-02-01 – 2019-02-04 (×4): 1 via ORAL
  Filled 2019-01-31 (×4): qty 1

## 2019-01-31 MED ORDER — POLYETHYLENE GLYCOL 3350 17 G PO PACK: 17.0000 g | PACK | Freq: Every day | ORAL | Status: DC | PRN

## 2019-01-31 MED ORDER — SODIUM CHLORIDE 0.9 % IV SOLN
INTRAVENOUS | Status: DC
  Administered 2019-01-31: 17:00:00 20 mL/h via INTRAVENOUS

## 2019-01-31 MED ORDER — CALCIUM CARBONATE ANTACID 500 MG PO CHEW: 2.0000 | CHEWABLE_TABLET | Freq: Every day | ORAL | Status: DC | PRN

## 2019-01-31 MED ORDER — SODIUM CHLORIDE 0.9% FLUSH: 10.0000 mL | INTRAVENOUS | Status: DC | PRN

## 2019-01-31 NOTE — Progress Notes (Signed)
Patient due to begin chemotherapy infusion.  Aldean Baker is currently the only chemo competent nurse on the department.  She called me to assist with chemo verification.  I logged into patient's chart and verified patient's BSA and chemo doses.  I was able to visualize a picture of patient's chemo bag and verified patient identifiers, chemo drugs, doses, and VTBI.  Also, verified expiration date and time and 2 pharmacy signatures.  Current rate of infusion on label is 51 mL/hr.  My calculation shows it should be a rate of 46 mL/hr.  Rollene Fare confirmed this.  She spoke with pharmacist and adjustment was made.  Rollene Fare will hang chemo and this will be cosigned at bedside with Marta Lamas, RN.  Zandra Abts [facility]  01/31/2019  5:33 PM

## 2019-01-31 NOTE — H&P (Addendum)
[place]  [phone] [phone]   MEDICAL ONCOLOGY ADMISSION HISTORY & PHYSICAL  Chief complaint: Admission for cycle 2R-EPOCH for diffuse large B-cell lymphoma  HPI: Ms. Goodgame is a 58 year old female with a past medical history significant for anxiety, Crohn's disease, hypertension, hyperlipidemia, migraine headaches.  The patient's history is obtained through a Spanish interpreter via video, [place].  The patient reports that she has history of Crohn's disease, for which she has been taking mesalamine with very good control of her symptoms.  She was previously followed by [name] of gastroenterology and to transition her care to a different gastroenterologist after [name] moved to [place].  Her new gastroenterologist stopped her mesalamine some time in 2019, and soon after that, she began to develop new onset left lower quadrant discomfort/pain, nonradiating, dull, intermittent, mild to moderate in intensity, exacerbated by eating and improved with defecation.  She underwent colonoscopy, which was unremarkable, and was recommended to modify her diet without any improvement.  She then re-established her care with [name], who ordered CT abdomen/pelvis that showed diffuse abdominal lymphadenopathy.  PET showed diffuse lymph node involvement in the neck, chest, abdomen and the pelvis, and patient was referred to oncology for further evaluation.  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 The patient initially received 1 cycle of R-CHOP  which she tolerated fairly well with exception of hyponatremia and neutropenia.  Prior to her second planned cycle of chemotherapy, she was found to be positive for Myc and BCL-2 rearrangement.  She is now receiving  R-EPOCH and is here for cycle #2 of his chemotherapy.  Today, the patient reports that she is feeling well overall.   She reports a very good appetite and has been eating a lot at home.  She reports that her breathing is stable and maybe even slightly improved.  Had an episode of dizziness and vomiting and was seen in the ER last week. Symptoms have now resolved. Today, she denies any dizziness, headaches, chest discomfort, cough.  Denies abdominal pain, nausea, vomiting, constipation, diarrhea.  Denies bleeding.  The patient is here for admission for cycle #2 of R-EPOCH.     Past Medical History:  Diagnosis Date  . Acute cystitis   . Anxiety   . Arthritis   . Crohn disease ([place])    dx 2004, history of small bowel obstruction 02/2007 treated conservatively  . History of colon polyps   . History of shingles   . HTN (hypertension)   . Hypercholesterolemia   . Insomnia   . Migraine   . Uterine fibroid   :   Past Surgical History:  Procedure Laterality Date  . COLONOSCOPY  09/11/2014   Small internal hemorrhoids. Otherwise normal colonoscopy to terminal ileum  . COLONOSCOPY  10/02/2017  . ESOPHAGOGASTRODUODENOSCOPY  04/05/2007   Mild gastritis. Otherwise, normal esophagogastroduodenoscopy  . IR IMAGING GUIDED PORT INSERTION  11/29/2018  . LYMPH NODE BIOPSY Left 12/09/2018   Procedure: LEFT DEEP CERVICAL LYMPH NODE BIOPSY;  Surgeon: Fanny Skates, MD;  Location: [facility];  Service: General;  Laterality: Left;  . TUBAL LIGATION    :   Current Facility-Administered Medications  Medication Dose Route Frequency Provider Last Rate Last Dose  . Chlorhexidine Gluconate Cloth 2 % PADS 6 each  6 each Topical Daily Curcio, Kristin R, NP      . enoxaparin (LOVENOX) injection 40 mg  40 mg Subcutaneous Q24H  Maryanna Shape, NP   40 mg at 01/31/19 1230  . sodium chloride flush (NS) 0.9 % injection 10-40 mL  10-40 mL Intracatheter Q12H Curcio, Kristin R, NP   10 mL at 01/31/19 1231  . sodium chloride flush (NS) 0.9 % injection 10-40 mL  10-40 mL Intracatheter PRN Curcio, Roselie Awkward, NP          Allergies   Allergen Reactions  . Nsaids Anaphylaxis and Other (See Comments)    Abdominal pain, GI Bleeding   :   Family History  Problem Relation Age of Onset  . Colon cancer Neg Hx   :   Social History   Socioeconomic History  . Marital status: Married    Spouse name: Not on file  . Number of children: Not on file  . Years of education: Not on file  . Highest education level: Not on file  Occupational History  . Not on file  Social Needs  . Financial resource strain: Not on file  . Food insecurity    Worry: Not on file    Inability: Not on file  . Transportation needs    Medical: Not on file    Non-medical: Not on file  Tobacco Use  . Smoking status: Never Smoker  . Smokeless tobacco: Never Used  Substance and Sexual Activity  . Alcohol use: Never    Frequency: Never  . Drug use: Never  . Sexual activity: Yes    Birth control/protection: Post-menopausal  Lifestyle  . Physical activity    Days per week: Not on file    Minutes per session: Not on file  . Stress: Not on file  Relationships  . Social Herbalist on phone: Not on file    Gets together: Not on file    Attends religious service: Not on file    Active member of club or organization: Not on file    Attends meetings of clubs or organizations: Not on file    Relationship status: Not on file  . Intimate partner violence    Fear of current or ex partner: Not on file    Emotionally abused: Not on file    Physically abused: Not on file    Forced sexual activity: Not on file  Other Topics Concern  . Not on file  Social History Narrative  . Not on file  :   Review of systems: A comprehensive 14 point review of systems was negative except as noted in the HPI.  Exam: Patient Vitals for the past 24 hrs:  BP Temp Temp src Pulse Resp SpO2  01/31/19 1016 100/74 98.4 F (36.9 C) Oral 90 16 99 %    General:  well-nourished in no acute distress.   Eyes:  no scleral icterus.   ENT:  There were no  oropharyngeal lesions.   Neck was without thyromegaly.   Lymphatics:  Negative cervical, supraclavicular or axillary adenopathy.   Respiratory: Diminished in the left base, otherwise clear.   Cardiovascular:  Regular rate and rhythm, S1/S2, without murmur, rub or gallop.  There was no pedal edema.   GI:  abdomen was soft, flat, nontender, nondistended, without organomegaly.  Muscoloskeletal:  no spinal tenderness of palpation of vertebral spine.   Skin exam was without echymosis, petichae.   Neuro exam was nonfocal.  Patient was alert and oriented.  Attention was good.   Language was appropriate.  Mood was normal without depression.  Speech was not pressured.  Thought content  was not tangential.     Lab Results  Component Value Date   WBC 8.1 01/31/2019   HGB 9.7 (L) 01/31/2019   HCT 31.1 (L) 01/31/2019   PLT 373 01/31/2019   GLUCOSE 140 (H) 01/31/2019   ALT 18 01/31/2019   AST 15 01/31/2019   NA 134 (L) 01/31/2019   K 3.6 01/31/2019   CL 99 01/31/2019   CREATININE 0.68 01/31/2019   BUN 11 01/31/2019   CO2 24 01/31/2019    Mr Brain W DD Contrast  Result Date: 01/27/2019 CLINICAL DATA:  Vertigo episodic. History of lymphoma. Malignant pleural effusion. Crohn's disease. EXAM: MRI HEAD WITHOUT AND WITH CONTRAST TECHNIQUE: Multiplanar, multiecho pulse sequences of the brain and surrounding structures were obtained without and with intravenous contrast. CONTRAST:  43m GADAVIST GADOBUTROL 1 MMOL/ML IV SOLN COMPARISON:  None. FINDINGS: Brain: On the initial scan, the tech inadvertently did not perform coronal postcontrast imaging. The patient returned for postcontrast sagittal coronal and axial imaging as there was question of enhancing lesion in the left anterior putamen on the initial study. This area is not seen on repeat imaging and is felt to be artifact on the initial study. No enhancing mass lesion identified. Ventricle size normal. Cerebral volume normal. Negative for acute infarct.  Mild periventricular white matter hyperintensity bilaterally appears chronic. No hemorrhage mass or edema. Vascular: Normal arterial flow voids. Skull and upper cervical spine: Negative Sinuses/Orbits: Mucosal edema paranasal sinuses.  Normal orbit Other: None IMPRESSION: No acute abnormality.  No acute infarct or mass Repeat imaging fails to confirm a small enhancing lesion left anterior putamen which is felt to be an artifact on the initial axial sequence postcontrast. Electronically Signed   By: CFranchot GalloM.D.   On: 01/27/2019 19:22    Mr BJeri CosWUKContrast  Result Date: 01/27/2019 CLINICAL DATA:  Vertigo episodic. History of lymphoma. Malignant pleural effusion. Crohn's disease. EXAM: MRI HEAD WITHOUT AND WITH CONTRAST TECHNIQUE: Multiplanar, multiecho pulse sequences of the brain and surrounding structures were obtained without and with intravenous contrast. CONTRAST:  625mGADAVIST GADOBUTROL 1 MMOL/ML IV SOLN COMPARISON:  None. FINDINGS: Brain: On the initial scan, the tech inadvertently did not perform coronal postcontrast imaging. The patient returned for postcontrast sagittal coronal and axial imaging as there was question of enhancing lesion in the left anterior putamen on the initial study. This area is not seen on repeat imaging and is felt to be artifact on the initial study. No enhancing mass lesion identified. Ventricle size normal. Cerebral volume normal. Negative for acute infarct. Mild periventricular white matter hyperintensity bilaterally appears chronic. No hemorrhage mass or edema. Vascular: Normal arterial flow voids. Skull and upper cervical spine: Negative Sinuses/Orbits: Mucosal edema paranasal sinuses.  Normal orbit Other: None IMPRESSION: No acute abnormality.  No acute infarct or mass Repeat imaging fails to confirm a small enhancing lesion left anterior putamen which is felt to be an artifact on the initial axial sequence postcontrast. Electronically Signed   By: ChFranchot Gallo.D.   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 The intent is  for cure. -We discussed some of the risks, benefits and side-effects of Rituximab, Etoposide, Vincristine, Adriamycin, Cytoxan, Prednisone. The regimen requires inpatient admission due to continuous chemotherapy infusion  -Some of the short term side-effects included, though not limited to, risk of fatigue, weight loss, tumor lysis syndrome, risk of allergic reactions, pancytopenia, life-threatening infections, need for transfusions of blood products, nausea, vomiting, change in bowel habits, hair loss, risk of congestive heart failure, admission to hospital for various reasons, and risks of death.  -Long term side-effects are also discussed including permanent damage to nerve function, chronic fatigue, and rare secondary malignancy including bone marrow disorders.  -The patient is aware that the response rates discussed earlier is not guaranteed.   -After a long discussion, patient made an informed decision to proceed with the prescribed plan of care.  -She will proceed with cycle #2 of EPOCH today as scheduled.  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 -S/p thoracentesis x 2 -Recent CXR showed improvement in left pleural effusion -Clinically, patient denies any recurrent dyspnea; no significant effusion on exam -We will monitor it for now   Normocytic anemia -Secondary to anemia of chronic disease  -Hgb is 9.7 today -Patient denies any symptoms of  bleeding -We will monitor it for now   Leukocytosis -WBC is normal today -We will monitor it for now  Hyponatremia -Na 134 today -Continue Pedialyte and gentle salt addition to the diet, and maintain adequate hydration  -We will monitor it closely  Hypokalemia -K 3.6 today -Has been on potassium chloride 20 mEq daily as an outpatient in the past. Will hold off on ordering for now. Monitor K+ daily.   Chemotherapy-associated nausea  -Secondary to chemotherapy -Symptoms relatively well controlled  -Continue PRN-anti-emetics   All questions were answered. The patient knows to call the clinic with any problems, questions or concerns. No barriers to learning was detected.  Mikey Bussing, DNP, AGPCNP-BC, AOCNP   ADDENDUM  .Patient was Personally and independently interviewed, examined and relevant elements of the history of present illness were reviewed in details and an assessment and plan was created. All elements of the patient's history of present illness , assessment and plan were discussed in details with Mikey Bussing, DNP. The above documentation reflects our combined findings assessment and plan.  Labs stable -- patient proceeding with C2 of EPOCH-R Discussed getting PET/CT prior to next cycle of treatment Discussed consideration of IT Methotrexate for CNS prophylaxis given double hit lymphoma.  Sullivan Lone MD MS

## 2019-02-01 ENCOUNTER — Ambulatory Visit: Payer: Commercial Managed Care - PPO

## 2019-02-01 ENCOUNTER — Other Ambulatory Visit: Payer: Self-pay | Admitting: Hematology

## 2019-02-01 ENCOUNTER — Other Ambulatory Visit: Payer: Commercial Managed Care - PPO

## 2019-02-01 ENCOUNTER — Other Ambulatory Visit: Payer: Self-pay | Admitting: Oncology

## 2019-02-01 ENCOUNTER — Ambulatory Visit: Payer: Commercial Managed Care - PPO | Admitting: Hematology

## 2019-02-01 DIAGNOSIS — C8330 Diffuse large B-cell lymphoma, unspecified site: Secondary | ICD-10-CM

## 2019-02-01 LAB — COMPREHENSIVE METABOLIC PANEL
ALT: 18 U/L (ref 0–44)
AST: 13 U/L — ABNORMAL LOW (ref 15–41)
Albumin: 3.5 g/dL (ref 3.5–5.0)
Alkaline Phosphatase: 84 U/L (ref 38–126)
Anion gap: 6 (ref 5–15)
BUN: 12 mg/dL (ref 6–20)
CO2: 27 mmol/L (ref 22–32)
Calcium: 9.1 mg/dL (ref 8.9–10.3)
Chloride: 104 mmol/L (ref 98–111)
Creatinine, Ser: 0.51 mg/dL (ref 0.44–1.00)
GFR calc Af Amer: >60 mL/min (ref >60)
GFR calc non Af Amer: >60 mL/min (ref >60)
Glucose, Bld: 134 mg/dL — ABNORMAL HIGH (ref 70–99)
Potassium: 3.8 mmol/L (ref 3.5–5.1)
Sodium: 137 mmol/L (ref 135–145)
Total Bilirubin: 0.4 mg/dL (ref 0.3–1.2)
Total Protein: 6.5 g/dL (ref 6.5–8.1)

## 2019-02-01 LAB — SARS CORONAVIRUS 2 (TAT 6-24 HRS): SARS Coronavirus 2: NEGATIVE

## 2019-02-01 LAB — CBC WITH DIFFERENTIAL/PLATELET
Abs Immature Granulocytes: 0.03 10*3/uL (ref 0.00–0.07)
Basophils Absolute: 0 10*3/uL (ref 0.0–0.1)
Basophils Relative: 0 %
Eosinophils Absolute: 0 10*3/uL (ref 0.0–0.5)
Eosinophils Relative: 0 %
HCT: 31.4 % — ABNORMAL LOW (ref 36.0–46.0)
Hemoglobin: 10 g/dL — ABNORMAL LOW (ref 12.0–15.0)
Immature Granulocytes: 0 %
Lymphocytes Relative: 3 %
Lymphs Abs: 0.2 10*3/uL — ABNORMAL LOW (ref 0.7–4.0)
MCH: 28.2 pg (ref 26.0–34.0)
MCHC: 31.8 g/dL (ref 30.0–36.0)
MCV: 88.7 fL (ref 80.0–100.0)
Monocytes Absolute: 0.1 10*3/uL (ref 0.1–1.0)
Monocytes Relative: 1 %
Neutro Abs: 6.9 10*3/uL (ref 1.7–7.7)
Neutrophils Relative %: 96 %
Platelets: 408 10*3/uL — ABNORMAL HIGH (ref 150–400)
RBC: 3.54 MIL/uL — ABNORMAL LOW (ref 3.87–5.11)
RDW: 21.2 % — ABNORMAL HIGH (ref 11.5–15.5)
WBC: 7.3 10*3/uL (ref 4.0–10.5)
nRBC: 0 % (ref 0.0–0.2)

## 2019-02-01 LAB — URIC ACID: Uric Acid, Serum: 3.4 mg/dL (ref 2.5–7.1)

## 2019-02-01 LAB — MAGNESIUM: Magnesium: 2 mg/dL (ref 1.7–2.4)

## 2019-02-01 LAB — PHOSPHORUS: Phosphorus: 4.2 mg/dL (ref 2.5–4.6)

## 2019-02-01 MED ORDER — SODIUM CHLORIDE 0.9 % IV SOLN
Freq: Once | INTRAVENOUS | Status: AC
  Administered 2019-02-01: 18:00:00 18 mg via INTRAVENOUS
  Filled 2019-02-01: qty 4

## 2019-02-01 MED ORDER — VINCRISTINE SULFATE CHEMO INJECTION 1 MG/ML
Freq: Once | INTRAVENOUS | Status: AC
  Administered 2019-02-01: 18:00:00 via INTRAVENOUS
  Filled 2019-02-01: qty 8

## 2019-02-01 NOTE — Progress Notes (Signed)
Doxorubicin, Etoposide and Vincristine IVCI rate adjusted for overfill in bag and true 22 hour infusion.  Hardie Pulley, PharmD, BCPS, BCOP

## 2019-02-01 NOTE — Progress Notes (Addendum)
HEMATOLOGY-ONCOLOGY PROGRESS NOTE  SUBJECTIVE: Tolerated day 1 of chemotherapy overall.  Denies mucositis, nausea, vomiting.  Bowels moved yesterday.  Denies chest pain, shortness of breath, cough.  Oncology History  DLBCL (diffuse large B cell lymphoma) ([facility])  10/29/2018 Imaging   CT abdomen/pelvis: IMPRESSION: Abdominopelvic lymphadenopathy, including a dominant 3.2 cm short axis jejunal mesentery nodal mass encasing the SMA, partially necrotic. Overall appearance favors lymphoma, although nodal metastases are also possible.   Spleen is normal in size. However, hypoenhancing lesions are suspected on the portal venous phase, raising the possibility of lymphomatous involvement.   Suspected subcarinal nodal mass, incompletely evaluated. Consider CT chest or PET-CT for further evaluation.   Moderate left pleural effusion. Associated left lower lobe opacity, likely atelectasis.   11/10/2018 Imaging   PET: IMPRESSION: 1. 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 2. Nodal metastasis include the lower neck, mediastinum, mesentery, peritoneum and retroperitoneum, and iliac lymph nodes. 3. Spleen and bone marrow normal. 4. Moderate LEFT pleural effusion. 5. Target lymph nodes for sampling could include the LEFT external iliac lymph node, precordial lymph node in the LEFT upper quadrant, or super clavicular/LEFT sub pectoralis nodes.   12/03/2018 Bone Marrow Biopsy   Bone Marrow, Aspirate,Biopsy, and Clot, left posterior iliac crest - NORMOCELLULAR MARROW WITH FOCAL SMALL LYMPHOID AGGREGATES. - SEE COMMENT. PERIPHERAL BLOOD: - BORDERLINE NORMOCYTIC ANEMIA. Diagnosis Note The marrow has only a few small lymphoid aggregates which are not overtly atypical and contain a mixture of B-cells and T-cells. Flow cytometry is negative for a monoclonal B-cell population. These findings are not diagnostic of a lymphoproliferative process.  Accession:  [id number] Bone Marrow Flow Cytometry - NO MONOCLONAL B-CELL OR PHENOTYPICALLY ABERRANT T-CELL POPULATION IDENTIFIED.   12/03/2018 Imaging   TTE:  1. The left ventricle has normal systolic function with an ejection fraction of 60-65%. The cavity size was normal. Left ventricular diastolic Doppler parameters are consistent with impaired relaxation.  2. The right ventricle has normal systolic function. The cavity was normal. There is no increase in right ventricular wall thickness.  3. Large pleural effusion.  4. Clinical correlation suggested.  5. The mitral valve is grossly normal.  6. The tricuspid valve is grossly normal.  7. The aortic valve is grossly normal. Aortic valve regurgitation was not assessed by color flow Doppler.  8. The aortic root is normal in size and structure.   12/08/2018 Procedure   Left thoracentesis   12/08/2018 Pathology Results   Diagnosis PLEURAL FLUID, LEFT (SPECIMEN 1 OF 1 COLLECTED 12/08/18): - B-CELL LYMPHOMA - SEE COMMENT   12/09/2018 Procedure   Left supraclavicular LN bx    12/09/2018 Pathology Results   Accession: [id number] Lymph node for lymphoma, left deep cervical - DIFFUSE LARGE B-CELL LYMPHOMA - SEE COMMENT   12/21/2018 - 01/10/2019 Chemotherapy   The patient had DOXOrubicin (ADRIAMYCIN) chemo injection 80 mg, 50 mg/m2 = 80 mg, Intravenous,  Once, 1 of 6 cycles Administration: 80 mg (12/21/2018) palonosetron (ALOXI) injection 0.25 mg, 0.25 mg, Intravenous,  Once, 1 of 6 cycles Administration: 0.25 mg (12/21/2018) pegfilgrastim-cbqv (UDENYCA) injection 6 mg, 6 mg, Subcutaneous, Once, 1 of 6 cycles Administration: 6 mg (12/23/2018) vinCRIStine (ONCOVIN) 2 mg in sodium chloride 0.9 % 50 mL chemo infusion, 2 mg, Intravenous,  Once, 1 of 6 cycles Administration: 2 mg (12/21/2018) riTUXimab (RITUXAN) 600 mg in sodium chloride 0.9 % 250 mL (1.9355 mg/mL) infusion, 375 mg/m2 = 600 mg, Intravenous,  Once, 1 of 1 cycle Administration: 600 mg  (12/21/2018) cyclophosphamide (CYTOXAN) 1,200  mg in sodium chloride 0.9 % 250 mL chemo infusion, 750 mg/m2 = 1,200 mg, Intravenous,  Once, 1 of 6 cycles Administration: 1,200 mg (12/21/2018) riTUXimab (RITUXAN) 600 mg in sodium chloride 0.9 % 190 mL infusion, 375 mg/m2 = 600 mg (100 % of original dose 375 mg/m2), Intravenous,  Once, 0 of 5 cycles Dose modification: 375 mg/m2 (original dose 375 mg/m2, Cycle 2, Reason: Provider Judgment, Comment: VO to change to RIR) fosaprepitant (EMEND) 150 mg, dexamethasone (DECADRON) 12 mg in sodium chloride 0.9 % 145 mL IVPB, , Intravenous,  Once, 1 of 6 cycles Administration:  (12/21/2018)  for chemotherapy treatment.    01/10/2019 -  Chemotherapy   The patient had DOXOrubicin (ADRIAMYCIN) 16 mg, etoposide (VEPESID) 80 mg, vinCRIStine (ONCOVIN) 0.6 mg in sodium chloride 0.9 % 1,000 mL chemo infusion, , Intravenous, Once, 2 of 5 cycles Administration:  (01/10/2019),  (01/11/2019),  (01/12/2019),  (01/13/2019) ondansetron (ZOFRAN) 8 mg, dexamethasone (DECADRON) 10 mg in sodium chloride 0.9 % 50 mL IVPB, , Intravenous,  Once, 2 of 5 cycles Administration: 18 mg (01/10/2019), 8 mg (01/11/2019), 36 mg (01/14/2019), 18 mg (01/12/2019), 18 mg (01/13/2019) cyclophosphamide (CYTOXAN) 1,180 mg in sodium chloride 0.9 % 250 mL chemo infusion, 750 mg/m2 = 1,180 mg, Intravenous,  Once, 2 of 5 cycles Administration: 1,180 mg (01/14/2019)  for chemotherapy treatment.    01/17/2019 -  Chemotherapy   The patient had pegfilgrastim-cbqv [facility]) injection 6 mg, 6 mg, Subcutaneous, Once, 1 of 4 cycles Administration: 6 mg (01/17/2019) riTUXimab (RITUXAN) 600 mg in sodium chloride 0.9 % 250 mL (1.9355 mg/mL) infusion, 375 mg/m2 = 600 mg, Intravenous,  Once, 1 of 4 cycles  for chemotherapy treatment.       REVIEW OF SYSTEMS:   Constitutional: Denies fevers, chills.  Reports mild fatigue. Ears, nose, mouth, throat, and face: Denies mucositis or sore throat Respiratory: Denies cough, dyspnea  or wheezes Cardiovascular: Denies palpitation, chest discomfort Gastrointestinal: Reports mild nausea, no vomiting.  Bowels moving well. Skin: Denies abnormal skin rashes Lymphatics: Denies new lymphadenopathy or easy bruising Neurological:Denies numbness, tingling or new weaknesses Behavioral/Psych: Mood is stable, no new changes  Extremities: No lower extremity edema All other systems were reviewed with the patient and are negative.  I have reviewed the past medical history, past surgical history, social history and family history with the patient and they are unchanged from previous note.   PHYSICAL EXAMINATION: ECOG PERFORMANCE STATUS: 0 - Asymptomatic  Vitals:   01/31/19 2155 02/01/19 0617  BP: 123/76 132/73  Pulse: 81 67  Resp: 17 18  Temp: 98 F (36.7 C) 97.8 F (36.6 C)  SpO2: 97% 98%   Filed Weights   01/31/19 1701 02/01/19 0617  Weight: 130 lb 14.4 oz (59.4 kg) 133 lb 13.1 oz (60.7 kg)    Intake/Output from previous day: 09/14 0701 - 09/15 0700 In: 1001.9 [P.O.:480; I.V.:97.8; IV Piggyback:424.2] Out: 2700 [Urine:2700]  GENERAL:alert, no distress and comfortable SKIN: skin color, texture, turgor are normal, no rashes or significant lesions EYES: normal, Conjunctiva are pink and non-injected, sclera clear OROPHARYNX:no exudate, no erythema and lips, buccal mucosa, and tongue normal  NECK: supple, thyroid normal size, non-tender, without nodularity LYMPH:  no palpable lymphadenopathy in the cervical, axillary or inguinal LUNGS: Diminished left base, otherwise clear. HEART: regular rate & rhythm and no murmurs and no lower extremity edema ABDOMEN:abdomen soft, non-tender and normal bowel sounds Musculoskeletal:no cyanosis of digits and no clubbing  NEURO: alert & oriented x 3 with fluent speech, no focal  motor/sensory deficits  LABORATORY DATA:  I have reviewed the data as listed CMP Latest Ref Rng & Units 02/01/2019 01/31/2019 01/27/2019  Glucose 70 - 99 mg/dL  134(H) 140(H) 138(H)  BUN 6 - 20 mg/dL _0 Creatinine 0.44 - 1.00 mg/dL 0.51 0.68 0.58  Sodium 135 - 145 mmol/L 137 134(L) 137  Potassium 3.5 - 5.1 mmol/L 3.8 3.6 3.4(L)  Chloride 98 - 111 mmol/L 104 99 102  CO2 22 - 32 mmol/L _1 Calcium 8.9 - 10.3 mg/dL 9.1 8.7(L) 8.5(L)  Total Protein 6.5 - 8.1 g/dL 6.5 6.5 6.8  Total Bilirubin 0.3 - 1.2 mg/dL 0.4 0.6 0.6  Alkaline Phos 38 - 126 U/L 84 85 124  AST 15 - 41 U/L 13(L) 15 19  ALT 0 - 44 U/L _2 Lab Results  Component Value Date   WBC 7.3 02/01/2019   HGB 10.0 (L) 02/01/2019   HCT 31.4 (L) 02/01/2019   MCV 88.7 02/01/2019   PLT 408 (H) 02/01/2019   NEUTROABS 6.9 02/01/2019    Mr Brain W OB Contrast  Result Date: 01/27/2019 CLINICAL DATA:  Vertigo episodic. History of lymphoma. Malignant pleural effusion. Crohn's disease. EXAM: MRI HEAD WITHOUT AND WITH CONTRAST TECHNIQUE: Multiplanar, multiecho pulse sequences of the brain and surrounding structures were obtained without and with intravenous contrast. CONTRAST:  55m GADAVIST GADOBUTROL 1 MMOL/ML IV SOLN COMPARISON:  None. FINDINGS: Brain: On the initial scan, the tech inadvertently did not perform coronal postcontrast imaging. The patient returned for postcontrast sagittal coronal and axial imaging as there was question of enhancing lesion in the left anterior putamen on the initial study. This area is not seen on repeat imaging and is felt to be artifact on the initial study. No enhancing mass lesion identified. Ventricle size normal. Cerebral volume normal. Negative for acute infarct. Mild periventricular white matter hyperintensity bilaterally appears chronic. No hemorrhage mass or edema. Vascular: Normal arterial flow voids. Skull and upper cervical spine: Negative Sinuses/Orbits: Mucosal edema paranasal sinuses.  Normal orbit Other: None IMPRESSION: No acute abnormality.  No acute infarct or mass Repeat imaging fails to confirm a small enhancing lesion left anterior  putamen which is felt to be an artifact on the initial axial sequence postcontrast. Electronically Signed   By: CFranchot GalloM.D.   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 The intent is for cure. -We discussed some of the risks, benefitsandside-effects of Rituximab,Etoposide, Vincristine, Adriamycin,Cytoxan,Prednisone.The regimen requires inpatient admission due to continuous chemotherapy infusion  -Some of the short term side-effects included, though not limited to, risk of fatigue, weight loss,tumor lysis syndrome, risk of allergic reactions,pancytopenia, life-threatening infections, need for transfusions of blood products, nausea, vomiting, change in bowel habits,hairloss, risk of congestive heart failure, admission to hospital for various reasons, and risks of death.  -Long term side-effects are also discussed includingpermanent damage to nerve function, chronic fatigue, and rare secondary malignancy including bone marrow disorders. -The patient is aware that the response rates discussed earlier is not guaranteed. -After a long discussion, patient made an informed decision to proceed with  the prescribed plan of care. -She tolerated day 1 of cycle 2 of her chemotherapy well overall.  Labs have been reviewed.  She will proceed with day 2 cycle 2 of EPOCH today as scheduled.; she will return to clinic on 02/07/2019 for outpatient Rituximab and Udenyca  -We will plan for restaging PET scan following cycle 2 of her chemotherapy. -She has antiemetics available to  her. -We will consider IT methotrexate for CNS prophylaxis given double hit lymphoma with next cycle.  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 -S/p thoracentesis x 2 -Recent CXR showed improvement in left pleural effusion -Clinically, patient denies any recurrent dyspnea; no significant effusion on exam -We will monitor it for now   Normocytic anemia -Secondary to anemia of chronic disease and chemotherapy -Hgb is 10.0 today which is stable -Patient denies any symptoms of bleeding -We will monitor it for now; continue daily CBC with differential  Leukocytosis -Most likely secondary to G-CSF  -White blood cell count is normal this morning -We will monitor it for now  Hyponatremia -Sodium level has normalized -Continue Pedialyte and gentle salt addition to the diet, and maintain adequate hydration -We will monitor it closely  Hypokalemia -Potassium stable at 3.8 -Not currently on potassium supplementation -Check CMET daily - may need to add KDur if K+ drops  Chemotherapy-associated nausea  -Secondary to chemotherapy -Symptoms relatively well controlled  -Continue PRN-anti-emetics  All questions were answered. The patient knows to call the clinic with any problems, questions or concerns. No barriers to learning was detected.   LOS: 1 day   Mikey Bussing, DNP, AGPCNP-BC, AOCNP 02/01/19  ADDENDUM  .Patient was Personally and independently interviewed, examined and relevant elements of the history of present illness were reviewed in details and an assessment and plan was created. All elements of the patient's history of present illness , assessment and plan were discussed in details with Mikey Bussing, DNP. The above documentation reflects our combined findings assessment and plan.  Patient with no prohibitive toxicity and tolerating EPOCH D2 well today. Continue treatment as per plan Sullivan Lone MD MS

## 2019-02-02 DIAGNOSIS — C8339 Diffuse large B-cell lymphoma, extranodal and solid organ sites: Secondary | ICD-10-CM

## 2019-02-02 LAB — CBC WITH DIFFERENTIAL/PLATELET
Abs Immature Granulocytes: 0.04 10*3/uL (ref 0.00–0.07)
Basophils Absolute: 0 10*3/uL (ref 0.0–0.1)
Basophils Relative: 0 %
Eosinophils Absolute: 0 10*3/uL (ref 0.0–0.5)
Eosinophils Relative: 0 %
HCT: 29.2 % — ABNORMAL LOW (ref 36.0–46.0)
Hemoglobin: 9.4 g/dL — ABNORMAL LOW (ref 12.0–15.0)
Immature Granulocytes: 1 %
Lymphocytes Relative: 4 %
Lymphs Abs: 0.3 10*3/uL — ABNORMAL LOW (ref 0.7–4.0)
MCH: 28.7 pg (ref 26.0–34.0)
MCHC: 32.2 g/dL (ref 30.0–36.0)
MCV: 89.3 fL (ref 80.0–100.0)
Monocytes Absolute: 0.2 10*3/uL (ref 0.1–1.0)
Monocytes Relative: 3 %
Neutro Abs: 7.6 10*3/uL (ref 1.7–7.7)
Neutrophils Relative %: 92 %
Platelets: 395 10*3/uL (ref 150–400)
RBC: 3.27 MIL/uL — ABNORMAL LOW (ref 3.87–5.11)
RDW: 21.3 % — ABNORMAL HIGH (ref 11.5–15.5)
WBC: 8.2 10*3/uL (ref 4.0–10.5)
nRBC: 0 % (ref 0.0–0.2)

## 2019-02-02 LAB — PHOSPHORUS: Phosphorus: 4.6 mg/dL (ref 2.5–4.6)

## 2019-02-02 LAB — COMPREHENSIVE METABOLIC PANEL
ALT: 16 U/L (ref 0–44)
AST: 13 U/L — ABNORMAL LOW (ref 15–41)
Albumin: 3.2 g/dL — ABNORMAL LOW (ref 3.5–5.0)
Alkaline Phosphatase: 70 U/L (ref 38–126)
Anion gap: 10 (ref 5–15)
BUN: 15 mg/dL (ref 6–20)
CO2: 23 mmol/L (ref 22–32)
Calcium: 9.1 mg/dL (ref 8.9–10.3)
Chloride: 105 mmol/L (ref 98–111)
Creatinine, Ser: 0.59 mg/dL (ref 0.44–1.00)
GFR calc Af Amer: >60 mL/min (ref >60)
GFR calc non Af Amer: >60 mL/min (ref >60)
Glucose, Bld: 126 mg/dL — ABNORMAL HIGH (ref 70–99)
Potassium: 3.7 mmol/L (ref 3.5–5.1)
Sodium: 138 mmol/L (ref 135–145)
Total Bilirubin: 0.4 mg/dL (ref 0.3–1.2)
Total Protein: 5.7 g/dL — ABNORMAL LOW (ref 6.5–8.1)

## 2019-02-02 LAB — URIC ACID: Uric Acid, Serum: 3.8 mg/dL (ref 2.5–7.1)

## 2019-02-02 LAB — MAGNESIUM: Magnesium: 2 mg/dL (ref 1.7–2.4)

## 2019-02-02 MED ORDER — VINCRISTINE SULFATE CHEMO INJECTION 1 MG/ML
Freq: Once | INTRAVENOUS | Status: AC
  Administered 2019-02-02: 18:00:00 via INTRAVENOUS
  Filled 2019-02-02: qty 8

## 2019-02-02 MED ORDER — POLYETHYLENE GLYCOL 3350 17 G PO PACK
17.0000 g | PACK | Freq: Once | ORAL | Status: AC
  Administered 2019-02-02: 17 g via ORAL
  Filled 2019-02-02: qty 1

## 2019-02-02 MED ORDER — SODIUM CHLORIDE 0.9 % IV SOLN
Freq: Once | INTRAVENOUS | Status: AC
  Administered 2019-02-02: 17:00:00 18 mg via INTRAVENOUS
  Filled 2019-02-02: qty 4

## 2019-02-02 NOTE — Progress Notes (Addendum)
HEMATOLOGY-ONCOLOGY PROGRESS NOTE  SUBJECTIVE: Continues to tolerate chemotherapy well overall.  Denies mucositis, nausea, vomiting.  Bowels moving, but reports that she is only moving a small amount.  Would like medication for constipation today.  Denies chest pain, shortness of breath, cough.  Oncology History  DLBCL (diffuse large B cell lymphoma) ([place])  10/29/2018 Imaging   CT abdomen/pelvis: IMPRESSION: Abdominopelvic lymphadenopathy, including a dominant 3.2 cm short axis jejunal mesentery nodal mass encasing the SMA, partially necrotic. Overall appearance favors lymphoma, although nodal metastases are also possible.   Spleen is normal in size. However, hypoenhancing lesions are suspected on the portal venous phase, raising the possibility of lymphomatous involvement.   Suspected subcarinal nodal mass, incompletely evaluated. Consider CT chest or PET-CT for further evaluation.   Moderate left pleural effusion. Associated left lower lobe opacity, likely atelectasis.   11/10/2018 Imaging   PET: IMPRESSION: 1. 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 2. Nodal metastasis include the lower neck, mediastinum, mesentery, peritoneum and retroperitoneum, and iliac lymph nodes. 3. Spleen and bone marrow normal. 4. Moderate LEFT pleural effusion. 5. Target lymph nodes for sampling could include the LEFT external iliac lymph node, precordial lymph node in the LEFT upper quadrant, or super clavicular/LEFT sub pectoralis nodes.   12/03/2018 Bone Marrow Biopsy   Bone Marrow, Aspirate,Biopsy, and Clot, left posterior iliac crest - NORMOCELLULAR MARROW WITH FOCAL SMALL LYMPHOID AGGREGATES. - SEE COMMENT. PERIPHERAL BLOOD: - BORDERLINE NORMOCYTIC ANEMIA. Diagnosis Note The marrow has only a few small lymphoid aggregates which are not overtly atypical and contain a mixture of B-cells and T-cells. Flow cytometry is negative for a monoclonal  B-cell population. These findings are not diagnostic of a lymphoproliferative process.  Accession: [id number] Bone Marrow Flow Cytometry - NO MONOCLONAL B-CELL OR PHENOTYPICALLY ABERRANT T-CELL POPULATION IDENTIFIED.   12/03/2018 Imaging   TTE:  1. The left ventricle has normal systolic function with an ejection fraction of 60-65%. The cavity size was normal. Left ventricular diastolic Doppler parameters are consistent with impaired relaxation.  2. The right ventricle has normal systolic function. The cavity was normal. There is no increase in right ventricular wall thickness.  3. Large pleural effusion.  4. Clinical correlation suggested.  5. The mitral valve is grossly normal.  6. The tricuspid valve is grossly normal.  7. The aortic valve is grossly normal. Aortic valve regurgitation was not assessed by color flow Doppler.  8. The aortic root is normal in size and structure.   12/08/2018 Procedure   Left thoracentesis   12/08/2018 Pathology Results   Diagnosis PLEURAL FLUID, LEFT (SPECIMEN 1 OF 1 COLLECTED 12/08/18): - B-CELL LYMPHOMA - SEE COMMENT   12/09/2018 Procedure   Left supraclavicular LN bx    12/09/2018 Pathology Results   Accession: [id number] Lymph node for lymphoma, left deep cervical - DIFFUSE LARGE B-CELL LYMPHOMA - SEE COMMENT   12/21/2018 - 01/10/2019 Chemotherapy   The patient had DOXOrubicin (ADRIAMYCIN) chemo injection 80 mg, 50 mg/m2 = 80 mg, Intravenous,  Once, 1 of 6 cycles Administration: 80 mg (12/21/2018) palonosetron (ALOXI) injection 0.25 mg, 0.25 mg, Intravenous,  Once, 1 of 6 cycles Administration: 0.25 mg (12/21/2018) pegfilgrastim-cbqv (UDENYCA) injection 6 mg, 6 mg, Subcutaneous, Once, 1 of 6 cycles Administration: 6 mg (12/23/2018) vinCRIStine (ONCOVIN) 2 mg in sodium chloride 0.9 % 50 mL chemo infusion, 2 mg, Intravenous,  Once, 1 of 6 cycles Administration: 2 mg (12/21/2018) riTUXimab (RITUXAN) 600 mg in sodium chloride 0.9 % 250 mL (1.9355 mg/mL)  infusion, 375 mg/m2 =  600 mg, Intravenous,  Once, 1 of 1 cycle Administration: 600 mg (12/21/2018) cyclophosphamide (CYTOXAN) 1,200 mg in sodium chloride 0.9 % 250 mL chemo infusion, 750 mg/m2 = 1,200 mg, Intravenous,  Once, 1 of 6 cycles Administration: 1,200 mg (12/21/2018) riTUXimab (RITUXAN) 600 mg in sodium chloride 0.9 % 190 mL infusion, 375 mg/m2 = 600 mg (100 % of original dose 375 mg/m2), Intravenous,  Once, 0 of 5 cycles Dose modification: 375 mg/m2 (original dose 375 mg/m2, Cycle 2, Reason: Provider Judgment, Comment: VO to change to RIR) fosaprepitant (EMEND) 150 mg, dexamethasone (DECADRON) 12 mg in sodium chloride 0.9 % 145 mL IVPB, , Intravenous,  Once, 1 of 6 cycles Administration:  (12/21/2018)  for chemotherapy treatment.    01/10/2019 -  Chemotherapy   The patient had DOXOrubicin (ADRIAMYCIN) 16 mg, etoposide (VEPESID) 80 mg, vinCRIStine (ONCOVIN) 0.6 mg in sodium chloride 0.9 % 1,000 mL chemo infusion, , Intravenous, Once, 2 of 5 cycles Administration:  (01/10/2019),  (01/11/2019),  (01/31/2019),  (02/01/2019),  (01/12/2019),  (01/13/2019) ondansetron (ZOFRAN) 8 mg, dexamethasone (DECADRON) 10 mg in sodium chloride 0.9 % 50 mL IVPB, , Intravenous,  Once, 2 of 5 cycles Administration: 18 mg (01/10/2019), 8 mg (01/11/2019), 36 mg (01/14/2019), 8 mg (01/31/2019), 18 mg (02/01/2019), 18 mg (01/12/2019), 18 mg (01/13/2019) cyclophosphamide (CYTOXAN) 1,180 mg in sodium chloride 0.9 % 250 mL chemo infusion, 750 mg/m2 = 1,180 mg, Intravenous,  Once, 2 of 5 cycles Administration: 1,180 mg (01/14/2019)  for chemotherapy treatment.    01/17/2019 -  Chemotherapy   The patient had pegfilgrastim-cbqv [facility]) injection 6 mg, 6 mg, Subcutaneous, Once, 1 of 4 cycles Administration: 6 mg (01/17/2019) riTUXimab (RITUXAN) 600 mg in sodium chloride 0.9 % 250 mL (1.9355 mg/mL) infusion, 375 mg/m2 = 600 mg, Intravenous,  Once, 1 of 4 cycles  for chemotherapy treatment.       REVIEW OF SYSTEMS:   Constitutional:  Denies fevers, chills.  Reports mild fatigue. Ears, nose, mouth, throat, and face: Denies mucositis or sore throat Respiratory: Denies cough, dyspnea or wheezes Cardiovascular: Denies palpitation, chest discomfort Gastrointestinal: Reports mild nausea, no vomiting.  Reports mild constipation. Skin: Denies abnormal skin rashes Lymphatics: Denies new lymphadenopathy or easy bruising Neurological:Denies numbness, tingling or new weaknesses Behavioral/Psych: Mood is stable, no new changes  Extremities: No lower extremity edema All other systems were reviewed with the patient and are negative.  I have reviewed the past medical history, past surgical history, social history and family history with the patient and they are unchanged from previous note.   PHYSICAL EXAMINATION: ECOG PERFORMANCE STATUS: 0 - Asymptomatic  Vitals:   02/01/19 2115 02/02/19 0633  BP: 116/70 135/77  Pulse: 79 69  Resp: 17 17  Temp: 98.9 F (37.2 C) 98 F (36.7 C)  SpO2: 97% 95%   Filed Weights   01/31/19 1701 02/01/19 0617 02/02/19 0700  Weight: 130 lb 14.4 oz (59.4 kg) 133 lb 13.1 oz (60.7 kg) 132 lb 1.6 oz (59.9 kg)    Intake/Output from previous day: 09/15 0701 - 09/16 0700 In: 720 [P.O.:720] Out: 400 [Urine:400]  GENERAL:alert, no distress and comfortable SKIN: skin color, texture, turgor are normal, no rashes or significant lesions EYES: normal, Conjunctiva are pink and non-injected, sclera clear OROPHARYNX:no exudate, no erythema and lips, buccal mucosa, and tongue normal  NECK: supple, thyroid normal size, non-tender, without nodularity LYMPH:  no palpable lymphadenopathy in the cervical, axillary or inguinal LUNGS: Diminished left base, otherwise clear. HEART: regular rate & rhythm and no murmurs  and no lower extremity edema ABDOMEN:abdomen soft, non-tender and normal bowel sounds Musculoskeletal:no cyanosis of digits and no clubbing  NEURO: alert & oriented x 3 with fluent speech, no focal  motor/sensory deficits  LABORATORY DATA:  I have reviewed the data as listed CMP Latest Ref Rng & Units 02/02/2019 02/01/2019 01/31/2019  Glucose 70 - 99 mg/dL 126(H) 134(H) 140(H)  BUN 6 - 20 mg/dL 15 12 11   Creatinine 0.44 - 1.00 mg/dL 0.59 0.51 0.68  Sodium 135 - 145 mmol/L 138 137 134(L)  Potassium 3.5 - 5.1 mmol/L 3.7 3.8 3.6  Chloride 98 - 111 mmol/L 105 104 99  CO2 22 - 32 mmol/L 23 27 24   Calcium 8.9 - 10.3 mg/dL 9.1 9.1 8.7(L)  Total Protein 6.5 - 8.1 g/dL 5.7(L) 6.5 6.5  Total Bilirubin 0.3 - 1.2 mg/dL 0.4 0.4 0.6  Alkaline Phos 38 - 126 U/L 70 84 85  AST 15 - 41 U/L 13(L) 13(L) 15  ALT 0 - 44 U/L 16 18 18     Lab Results  Component Value Date   WBC 8.2 02/02/2019   HGB 9.4 (L) 02/02/2019   HCT 29.2 (L) 02/02/2019   MCV 89.3 02/02/2019   PLT 395 02/02/2019   NEUTROABS 7.6 02/02/2019    Mr Brain W XN Contrast  Result Date: 01/27/2019 CLINICAL DATA:  Vertigo episodic. History of lymphoma. Malignant pleural effusion. Crohn's disease. EXAM: MRI HEAD WITHOUT AND WITH CONTRAST TECHNIQUE: Multiplanar, multiecho pulse sequences of the brain and surrounding structures were obtained without and with intravenous contrast. CONTRAST:  33m GADAVIST GADOBUTROL 1 MMOL/ML IV SOLN COMPARISON:  None. FINDINGS: Brain: On the initial scan, the tech inadvertently did not perform coronal postcontrast imaging. The patient returned for postcontrast sagittal coronal and axial imaging as there was question of enhancing lesion in the left anterior putamen on the initial study. This area is not seen on repeat imaging and is felt to be artifact on the initial study. No enhancing mass lesion identified. Ventricle size normal. Cerebral volume normal. Negative for acute infarct. Mild periventricular white matter hyperintensity bilaterally appears chronic. No hemorrhage mass or edema. Vascular: Normal arterial flow voids. Skull and upper cervical spine: Negative Sinuses/Orbits: Mucosal edema paranasal sinuses.   Normal orbit Other: None IMPRESSION: No acute abnormality.  No acute infarct or mass Repeat imaging fails to confirm a small enhancing lesion left anterior putamen which is felt to be an artifact on the initial axial sequence postcontrast. Electronically Signed   By: CFranchot GalloM.D.   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 The intent is for cure. -We discussed some of the risks, benefitsandside-effects of Rituximab,Etoposide, Vincristine, Adriamycin,Cytoxan,Prednisone.The regimen requires inpatient admission due to continuous chemotherapy infusion  -Some of the short term side-effects included, though not limited to, risk of fatigue, weight loss,tumor lysis syndrome, risk of allergic reactions,pancytopenia, life-threatening infections, need for transfusions of blood products, nausea, vomiting, change in bowel habits,hairloss, risk of congestive heart failure, admission to hospital for various reasons, and risks of death.  -Long term side-effects are also discussed includingpermanent damage to nerve function, chronic fatigue, and rare secondary  malignancy including bone marrow disorders. -The patient is aware that the response rates discussed earlier is not guaranteed. -After a long discussion, patient made an informed decision to proceed with the prescribed plan of care. -She tolerated day 2 of cycle 2 of her chemotherapy well overall.  Labs have been reviewed.  She will proceed with day 3 cycle 2 of EPOCH today as scheduled.; she will return to clinic on 02/07/2019  for outpatient Rituximab and Udenyca  -We will plan for restaging PET scan following cycle 2 of her chemotherapy. -She has antiemetics available to her. -We will consider IT methotrexate for CNS prophylaxis given double hit lymphoma with next cycle.  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  This remains overall stable. -Patient denies any symptoms of bleeding -We will monitor it for now; continue daily CBC with differential  Leukocytosis -Most likely secondary to G-CSF  -White blood cell count is normal this morning -We will monitor it for now  Hyponatremia -Sodium level has normalized -Continue Pedialyte and gentle salt addition to the diet, and maintain adequate hydration -We will monitor it closely  Hypokalemia -Potassium stable at 3.7 -Not currently on potassium supplementation -Check CMET daily - may need to add KDur if K+ drops  Chemotherapy-associated nausea  -Secondary to chemotherapy -Symptoms relatively well controlled  -Continue PRN-anti-emetics  Constipation -We will give her a dose of MiraLAX today.  All questions were answered. The patient knows to call the clinic with any problems, questions or concerns. No barriers to learning was detected.   LOS: 2 days   Mikey Bussing, DNP, AGPCNP-BC, AOCNP 02/02/19  ADDENDUM  .Patient was Personally and independently interviewed, examined and relevant elements of the history of present illness were reviewed in details and an assessment and plan was created. All elements of the patient's history of present illness , assessment and plan were discussed in details with Mikey Bussing, DNP The above documentation  reflects our combined findings assessment and plan.  . CBC    Component Value Date/Time   WBC 8.2 02/02/2019 0626   RBC 3.27 (L) 02/02/2019 0626   HGB 9.4 (L) 02/02/2019 0626   HGB 10.3 (L) 01/04/2019 1420   HCT 29.2 (L) 02/02/2019 0626   PLT 395 02/02/2019 0626   PLT 235 01/04/2019 1420   MCV 89.3 02/02/2019 0626   MCH 28.7 02/02/2019 0626   MCHC 32.2 02/02/2019 0626   RDW 21.3 (H) 02/02/2019 0626   LYMPHSABS 0.3 (L) 02/02/2019 0626   MONOABS 0.2 02/02/2019 0626   EOSABS 0.0 02/02/2019 0626   BASOSABS 0.0 02/02/2019 0626   . CMP Latest Ref Rng & Units 02/02/2019 02/01/2019 01/31/2019  Glucose 70 - 99 mg/dL 126(H) 134(H) 140(H)  BUN 6 - 20 mg/dL 15 12 11   Creatinine 0.44 - 1.00 mg/dL 0.59 0.51 0.68  Sodium 135 - 145 mmol/L 138 137 134(L)  Potassium 3.5 - 5.1 mmol/L 3.7 3.8 3.6  Chloride 98 - 111 mmol/L 105 104 99  CO2 22 - 32 mmol/L 23 27 24   Calcium 8.9 - 10.3 mg/dL 9.1 9.1 8.7(L)  Total Protein 6.5 - 8.1 g/dL 5.7(L) 6.5 6.5  Total Bilirubin 0.3 - 1.2 mg/dL 0.4 0.4 0.6  Alkaline Phos 38 - 126 U/L 70 84  85  AST 15 - 41 U/L 13(L) 13(L) 15  ALT 0 - 44 U/L 16 18 18    Patient tolerating C2 EPOCH D3-- continue treatment as per orders  Sullivan Lone MD MS

## 2019-02-03 ENCOUNTER — Ambulatory Visit: Payer: Commercial Managed Care - PPO

## 2019-02-03 LAB — COMPREHENSIVE METABOLIC PANEL
ALT: 16 U/L (ref 0–44)
AST: 13 U/L — ABNORMAL LOW (ref 15–41)
Albumin: 3.3 g/dL — ABNORMAL LOW (ref 3.5–5.0)
Alkaline Phosphatase: 68 U/L (ref 38–126)
Anion gap: 9 (ref 5–15)
BUN: 16 mg/dL (ref 6–20)
CO2: 25 mmol/L (ref 22–32)
Calcium: 8.7 mg/dL — ABNORMAL LOW (ref 8.9–10.3)
Chloride: 103 mmol/L (ref 98–111)
Creatinine, Ser: 0.62 mg/dL (ref 0.44–1.00)
GFR calc Af Amer: >60 mL/min (ref >60)
GFR calc non Af Amer: >60 mL/min (ref >60)
Glucose, Bld: 113 mg/dL — ABNORMAL HIGH (ref 70–99)
Potassium: 3.4 mmol/L — ABNORMAL LOW (ref 3.5–5.1)
Sodium: 137 mmol/L (ref 135–145)
Total Bilirubin: 0.4 mg/dL (ref 0.3–1.2)
Total Protein: 5.8 g/dL — ABNORMAL LOW (ref 6.5–8.1)

## 2019-02-03 LAB — CBC WITH DIFFERENTIAL/PLATELET
Abs Immature Granulocytes: 0.03 10*3/uL (ref 0.00–0.07)
Basophils Absolute: 0 10*3/uL (ref 0.0–0.1)
Basophils Relative: 0 %
Eosinophils Absolute: 0 10*3/uL (ref 0.0–0.5)
Eosinophils Relative: 0 %
HCT: 29.8 % — ABNORMAL LOW (ref 36.0–46.0)
Hemoglobin: 9.6 g/dL — ABNORMAL LOW (ref 12.0–15.0)
Immature Granulocytes: 1 %
Lymphocytes Relative: 5 %
Lymphs Abs: 0.2 10*3/uL — ABNORMAL LOW (ref 0.7–4.0)
MCH: 28.6 pg (ref 26.0–34.0)
MCHC: 32.2 g/dL (ref 30.0–36.0)
MCV: 88.7 fL (ref 80.0–100.0)
Monocytes Absolute: 0.3 10*3/uL (ref 0.1–1.0)
Monocytes Relative: 5 %
Neutro Abs: 4.2 10*3/uL (ref 1.7–7.7)
Neutrophils Relative %: 89 %
Platelets: 418 10*3/uL — ABNORMAL HIGH (ref 150–400)
RBC: 3.36 MIL/uL — ABNORMAL LOW (ref 3.87–5.11)
RDW: 21.1 % — ABNORMAL HIGH (ref 11.5–15.5)
WBC: 4.7 10*3/uL (ref 4.0–10.5)
nRBC: 0 % (ref 0.0–0.2)

## 2019-02-03 LAB — MAGNESIUM: Magnesium: 2 mg/dL (ref 1.7–2.4)

## 2019-02-03 LAB — URIC ACID: Uric Acid, Serum: 4 mg/dL (ref 2.5–7.1)

## 2019-02-03 LAB — PHOSPHORUS: Phosphorus: 3.8 mg/dL (ref 2.5–4.6)

## 2019-02-03 MED ORDER — SODIUM CHLORIDE 0.9 % IV SOLN
750.0000 mg/m2 | Freq: Once | INTRAVENOUS | Status: AC
  Administered 2019-02-04: 1180 mg via INTRAVENOUS
  Filled 2019-02-03: qty 59

## 2019-02-03 MED ORDER — SODIUM CHLORIDE 0.9 % IV SOLN
Freq: Once | INTRAVENOUS | Status: AC
  Administered 2019-02-03: 15:00:00 18 mg via INTRAVENOUS
  Filled 2019-02-03: qty 4

## 2019-02-03 MED ORDER — SODIUM CHLORIDE 0.9 % IV SOLN
Freq: Once | INTRAVENOUS | Status: AC
  Administered 2019-02-04: 12:00:00 36 mg via INTRAVENOUS
  Filled 2019-02-03: qty 8

## 2019-02-03 MED ORDER — POTASSIUM CHLORIDE CRYS ER 20 MEQ PO TBCR
20.0000 meq | EXTENDED_RELEASE_TABLET | Freq: Every day | ORAL | Status: DC
  Administered 2019-02-03 – 2019-02-04 (×2): 20 meq via ORAL
  Filled 2019-02-03 (×2): qty 1

## 2019-02-03 MED ORDER — VINCRISTINE SULFATE CHEMO INJECTION 1 MG/ML
Freq: Once | INTRAVENOUS | Status: AC
  Administered 2019-02-03: 16:00:00 via INTRAVENOUS
  Filled 2019-02-03: qty 8

## 2019-02-03 NOTE — Progress Notes (Addendum)
HEMATOLOGY-ONCOLOGY PROGRESS NOTE  SUBJECTIVE: Continues to tolerate her treatment very well.  Denies mucositis, nausea, vomiting.  Bowels are moving better after receiving MiraLAX yesterday.  Denies chest pain, shortness of breath, cough.  Oncology History  DLBCL (diffuse large B cell lymphoma) ([place])  10/29/2018 Imaging   CT abdomen/pelvis: IMPRESSION: Abdominopelvic lymphadenopathy, including a dominant 3.2 cm short axis jejunal mesentery nodal mass encasing the SMA, partially necrotic. Overall appearance favors lymphoma, although nodal metastases are also possible.   Spleen is normal in size. However, hypoenhancing lesions are suspected on the portal venous phase, raising the possibility of lymphomatous involvement.   Suspected subcarinal nodal mass, incompletely evaluated. Consider CT chest or PET-CT for further evaluation.   Moderate left pleural effusion. Associated left lower lobe opacity, likely atelectasis.   11/10/2018 Imaging   PET: IMPRESSION: 1. 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 2. Nodal metastasis include the lower neck, mediastinum, mesentery, peritoneum and retroperitoneum, and iliac lymph nodes. 3. Spleen and bone marrow normal. 4. Moderate LEFT pleural effusion. 5. Target lymph nodes for sampling could include the LEFT external iliac lymph node, precordial lymph node in the LEFT upper quadrant, or super clavicular/LEFT sub pectoralis nodes.   12/03/2018 Bone Marrow Biopsy   Bone Marrow, Aspirate,Biopsy, and Clot, left posterior iliac crest - NORMOCELLULAR MARROW WITH FOCAL SMALL LYMPHOID AGGREGATES. - SEE COMMENT. PERIPHERAL BLOOD: - BORDERLINE NORMOCYTIC ANEMIA. Diagnosis Note The marrow has only a few small lymphoid aggregates which are not overtly atypical and contain a mixture of B-cells and T-cells. Flow cytometry is negative for a monoclonal B-cell population. These findings are not diagnostic of  a lymphoproliferative process.  Accession: [id number] Bone Marrow Flow Cytometry - NO MONOCLONAL B-CELL OR PHENOTYPICALLY ABERRANT T-CELL POPULATION IDENTIFIED.   12/03/2018 Imaging   TTE:  1. The left ventricle has normal systolic function with an ejection fraction of 60-65%. The cavity size was normal. Left ventricular diastolic Doppler parameters are consistent with impaired relaxation.  2. The right ventricle has normal systolic function. The cavity was normal. There is no increase in right ventricular wall thickness.  3. Large pleural effusion.  4. Clinical correlation suggested.  5. The mitral valve is grossly normal.  6. The tricuspid valve is grossly normal.  7. The aortic valve is grossly normal. Aortic valve regurgitation was not assessed by color flow Doppler.  8. The aortic root is normal in size and structure.   12/08/2018 Procedure   Left thoracentesis   12/08/2018 Pathology Results   Diagnosis PLEURAL FLUID, LEFT (SPECIMEN 1 OF 1 COLLECTED 12/08/18): - B-CELL LYMPHOMA - SEE COMMENT   12/09/2018 Procedure   Left supraclavicular LN bx    12/09/2018 Pathology Results   Accession: [id number] Lymph node for lymphoma, left deep cervical - DIFFUSE LARGE B-CELL LYMPHOMA - SEE COMMENT   12/21/2018 - 01/10/2019 Chemotherapy   The patient had DOXOrubicin (ADRIAMYCIN) chemo injection 80 mg, 50 mg/m2 = 80 mg, Intravenous,  Once, 1 of 6 cycles Administration: 80 mg (12/21/2018) palonosetron (ALOXI) injection 0.25 mg, 0.25 mg, Intravenous,  Once, 1 of 6 cycles Administration: 0.25 mg (12/21/2018) pegfilgrastim-cbqv (UDENYCA) injection 6 mg, 6 mg, Subcutaneous, Once, 1 of 6 cycles Administration: 6 mg (12/23/2018) vinCRIStine (ONCOVIN) 2 mg in sodium chloride 0.9 % 50 mL chemo infusion, 2 mg, Intravenous,  Once, 1 of 6 cycles Administration: 2 mg (12/21/2018) riTUXimab (RITUXAN) 600 mg in sodium chloride 0.9 % 250 mL (1.9355 mg/mL) infusion, 375 mg/m2 = 600 mg, Intravenous,  Once, 1 of 1  cycle Administration:  600 mg (12/21/2018) cyclophosphamide (CYTOXAN) 1,200 mg in sodium chloride 0.9 % 250 mL chemo infusion, 750 mg/m2 = 1,200 mg, Intravenous,  Once, 1 of 6 cycles Administration: 1,200 mg (12/21/2018) riTUXimab (RITUXAN) 600 mg in sodium chloride 0.9 % 190 mL infusion, 375 mg/m2 = 600 mg (100 % of original dose 375 mg/m2), Intravenous,  Once, 0 of 5 cycles Dose modification: 375 mg/m2 (original dose 375 mg/m2, Cycle 2, Reason: Provider Judgment, Comment: VO to change to RIR) fosaprepitant (EMEND) 150 mg, dexamethasone (DECADRON) 12 mg in sodium chloride 0.9 % 145 mL IVPB, , Intravenous,  Once, 1 of 6 cycles Administration:  (12/21/2018)  for chemotherapy treatment.    01/10/2019 -  Chemotherapy   The patient had DOXOrubicin (ADRIAMYCIN) 16 mg, etoposide (VEPESID) 80 mg, vinCRIStine (ONCOVIN) 0.6 mg in sodium chloride 0.9 % 1,000 mL chemo infusion, , Intravenous, Once, 2 of 5 cycles Administration:  (01/10/2019),  (01/11/2019),  (01/31/2019),  (02/01/2019),  (01/12/2019),  (01/13/2019),  (02/02/2019) ondansetron (ZOFRAN) 8 mg, dexamethasone (DECADRON) 10 mg in sodium chloride 0.9 % 50 mL IVPB, , Intravenous,  Once, 2 of 5 cycles Administration: 18 mg (01/10/2019), 8 mg (01/11/2019), 36 mg (01/14/2019), 8 mg (01/31/2019), 18 mg (02/01/2019), 18 mg (01/12/2019), 18 mg (01/13/2019), 18 mg (02/02/2019) cyclophosphamide (CYTOXAN) 1,180 mg in sodium chloride 0.9 % 250 mL chemo infusion, 750 mg/m2 = 1,180 mg, Intravenous,  Once, 2 of 5 cycles Administration: 1,180 mg (01/14/2019)  for chemotherapy treatment.    01/17/2019 -  Chemotherapy   The patient had pegfilgrastim-cbqv [facility]) injection 6 mg, 6 mg, Subcutaneous, Once, 1 of 4 cycles Administration: 6 mg (01/17/2019) riTUXimab (RITUXAN) 600 mg in sodium chloride 0.9 % 250 mL (1.9355 mg/mL) infusion, 375 mg/m2 = 600 mg, Intravenous,  Once, 1 of 4 cycles  for chemotherapy treatment.       REVIEW OF SYSTEMS:   Constitutional: Denies fevers, chills.   Reports mild fatigue. Ears, nose, mouth, throat, and face: Denies mucositis or sore throat Respiratory: Denies cough, dyspnea or wheezes Cardiovascular: Denies palpitation, chest discomfort Gastrointestinal: Reports mild nausea, no vomiting.  Constipation improved after receiving MiraLAX. Skin: Denies abnormal skin rashes Lymphatics: Denies new lymphadenopathy or easy bruising Neurological:Denies numbness, tingling or new weaknesses Behavioral/Psych: Mood is stable, no new changes  Extremities: No lower extremity edema All other systems were reviewed with the patient and are negative.  I have reviewed the past medical history, past surgical history, social history and family history with the patient and they are unchanged from previous note.   PHYSICAL EXAMINATION: ECOG PERFORMANCE STATUS: 0 - Asymptomatic  Vitals:   02/02/19 2213 02/03/19 0635  BP: 115/73 (!) 148/84  Pulse: (!) 58 62  Resp: 19 18  Temp: 98 F (36.7 C) 98.2 F (36.8 C)  SpO2: 97% 99%   Filed Weights   02/01/19 0617 02/02/19 0700 02/03/19 0635  Weight: 133 lb 13.1 oz (60.7 kg) 132 lb 1.6 oz (59.9 kg) 134 lb 7.7 oz (61 kg)    Intake/Output from previous day: 09/16 0701 - 09/17 0700 In: 1440 [P.O.:1440] Out: -   GENERAL:alert, no distress and comfortable SKIN: skin color, texture, turgor are normal, no rashes or significant lesions EYES: normal, Conjunctiva are pink and non-injected, sclera clear OROPHARYNX:no exudate, no erythema and lips, buccal mucosa, and tongue normal  NECK: supple, thyroid normal size, non-tender, without nodularity LYMPH:  no palpable lymphadenopathy in the cervical, axillary or inguinal LUNGS: Diminished left base, otherwise clear. HEART: regular rate & rhythm and no murmurs and  no lower extremity edema ABDOMEN:abdomen soft, non-tender and normal bowel sounds Musculoskeletal:no cyanosis of digits and no clubbing  NEURO: alert & oriented x 3 with fluent speech, no focal  motor/sensory deficits  LABORATORY DATA:  I have reviewed the data as listed CMP Latest Ref Rng & Units 02/03/2019 02/02/2019 02/01/2019  Glucose 70 - 99 mg/dL 113(H) 126(H) 134(H)  BUN 6 - 20 mg/dL 16 15 12   Creatinine 0.44 - 1.00 mg/dL 0.62 0.59 0.51  Sodium 135 - 145 mmol/L 137 138 137  Potassium 3.5 - 5.1 mmol/L 3.4(L) 3.7 3.8  Chloride 98 - 111 mmol/L 103 105 104  CO2 22 - 32 mmol/L 25 23 27   Calcium 8.9 - 10.3 mg/dL 8.7(L) 9.1 9.1  Total Protein 6.5 - 8.1 g/dL 5.8(L) 5.7(L) 6.5  Total Bilirubin 0.3 - 1.2 mg/dL 0.4 0.4 0.4  Alkaline Phos 38 - 126 U/L 68 70 84  AST 15 - 41 U/L 13(L) 13(L) 13(L)  ALT 0 - 44 U/L 16 16 18     Lab Results  Component Value Date   WBC 4.7 02/03/2019   HGB 9.6 (L) 02/03/2019   HCT 29.8 (L) 02/03/2019   MCV 88.7 02/03/2019   PLT 418 (H) 02/03/2019   NEUTROABS 4.2 02/03/2019    Mr Brain W KG Contrast  Result Date: 01/27/2019 CLINICAL DATA:  Vertigo episodic. History of lymphoma. Malignant pleural effusion. Crohn's disease. EXAM: MRI HEAD WITHOUT AND WITH CONTRAST TECHNIQUE: Multiplanar, multiecho pulse sequences of the brain and surrounding structures were obtained without and with intravenous contrast. CONTRAST:  73m GADAVIST GADOBUTROL 1 MMOL/ML IV SOLN COMPARISON:  None. FINDINGS: Brain: On the initial scan, the tech inadvertently did not perform coronal postcontrast imaging. The patient returned for postcontrast sagittal coronal and axial imaging as there was question of enhancing lesion in the left anterior putamen on the initial study. This area is not seen on repeat imaging and is felt to be artifact on the initial study. No enhancing mass lesion identified. Ventricle size normal. Cerebral volume normal. Negative for acute infarct. Mild periventricular white matter hyperintensity bilaterally appears chronic. No hemorrhage mass or edema. Vascular: Normal arterial flow voids. Skull and upper cervical spine: Negative Sinuses/Orbits: Mucosal edema paranasal  sinuses.  Normal orbit Other: None IMPRESSION: No acute abnormality.  No acute infarct or mass Repeat imaging fails to confirm a small enhancing lesion left anterior putamen which is felt to be an artifact on the initial axial sequence postcontrast. Electronically Signed   By: CFranchot GalloM.D.   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 The intent is for cure. -We discussed some of the risks, benefitsandside-effects of Rituximab,Etoposide, Vincristine, Adriamycin,Cytoxan,Prednisone.The regimen requires inpatient admission due to continuous chemotherapy infusion  -Some of the short term side-effects included, though not limited to, risk of fatigue, weight loss,tumor lysis syndrome, risk of allergic reactions,pancytopenia, life-threatening infections, need for transfusions of blood products, nausea, vomiting, change in bowel habits,hairloss, risk of congestive heart failure, admission to hospital for various reasons, and risks of death.  -Long term side-effects are also discussed includingpermanent damage to nerve function, chronic fatigue, and rare secondary  malignancy including bone marrow disorders. -The patient is aware that the response rates discussed earlier is not guaranteed. -After a long discussion, patient made an informed decision to proceed with the prescribed plan of care. -She tolerated day 3 of cycle 2 of her chemotherapy well overall.  Labs have been reviewed.  She will proceed with day 4 cycle 2 of EPOCH today as scheduled.; she will return to clinic on  02/07/2019 for outpatient Rituximab and Udenyca  -We will plan for restaging PET scan following cycle 2 of her chemotherapy. -She has antiemetics available to her. -We will consider IT methotrexate for CNS prophylaxis given double hit lymphoma with next cycle.  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  This remains overall stable. -Patient denies any symptoms of bleeding -We will monitor it for now; continue daily CBC with differential  Leukocytosis -Most likely secondary to G-CSF  -White blood cell count is normal this morning -We will monitor it for now  Hyponatremia -Sodium level has normalized -Continue Pedialyte and gentle salt addition to the diet, and maintain adequate hydration -We will monitor it closely  Hypokalemia -Potassium down to 3.4 this morning -Not currently on potassium supplementation -we will start her on potassium chloride 20 mEq daily. -Check CMET daily   Chemotherapy-associated nausea  -Secondary to chemotherapy -Symptoms relatively well controlled  -Continue PRN-anti-emetics  Constipation -Improved after receiving MiraLAX yesterday.  She has PRN MiraLAX available to her.  All questions were answered. The patient knows to call the clinic with any problems, questions or concerns. No barriers to learning was detected.   LOS: 3 days   Mikey Bussing, DNP, AGPCNP-BC, AOCNP 02/03/19    ADDENDUM  .Patient was Personally and independently interviewed, examined and relevant elements of the history of present illness were reviewed in details and an assessment and plan was created. All elements of the patient's history of present illness , assessment  and plan were discussed in details with Mikey Bussing, DNP. The above documentation reflects our combined findings assessment and plan.  Sullivan Lone MD MS

## 2019-02-04 ENCOUNTER — Telehealth: Payer: Self-pay | Admitting: Hematology

## 2019-02-04 DIAGNOSIS — E876 Hypokalemia: Secondary | ICD-10-CM

## 2019-02-04 LAB — CBC WITH DIFFERENTIAL/PLATELET
Abs Immature Granulocytes: 0.05 10*3/uL (ref 0.00–0.07)
Basophils Absolute: 0 10*3/uL (ref 0.0–0.1)
Basophils Relative: 0 %
Eosinophils Absolute: 0 10*3/uL (ref 0.0–0.5)
Eosinophils Relative: 0 %
HCT: 29 % — ABNORMAL LOW (ref 36.0–46.0)
Hemoglobin: 9.3 g/dL — ABNORMAL LOW (ref 12.0–15.0)
Immature Granulocytes: 2 %
Lymphocytes Relative: 8 %
Lymphs Abs: 0.2 10*3/uL — ABNORMAL LOW (ref 0.7–4.0)
MCH: 27.9 pg (ref 26.0–34.0)
MCHC: 32.1 g/dL (ref 30.0–36.0)
MCV: 87.1 fL (ref 80.0–100.0)
Monocytes Absolute: 0.2 10*3/uL (ref 0.1–1.0)
Monocytes Relative: 5 %
Neutro Abs: 2.7 10*3/uL (ref 1.7–7.7)
Neutrophils Relative %: 85 %
Platelets: 398 10*3/uL (ref 150–400)
RBC: 3.33 MIL/uL — ABNORMAL LOW (ref 3.87–5.11)
RDW: 20.3 % — ABNORMAL HIGH (ref 11.5–15.5)
WBC: 3.1 10*3/uL — ABNORMAL LOW (ref 4.0–10.5)
nRBC: 0 % (ref 0.0–0.2)

## 2019-02-04 LAB — COMPREHENSIVE METABOLIC PANEL
ALT: 18 U/L (ref 0–44)
AST: 14 U/L — ABNORMAL LOW (ref 15–41)
Albumin: 3.3 g/dL — ABNORMAL LOW (ref 3.5–5.0)
Alkaline Phosphatase: 60 U/L (ref 38–126)
Anion gap: 7 (ref 5–15)
BUN: 17 mg/dL (ref 6–20)
CO2: 25 mmol/L (ref 22–32)
Calcium: 8.6 mg/dL — ABNORMAL LOW (ref 8.9–10.3)
Chloride: 106 mmol/L (ref 98–111)
Creatinine, Ser: 0.63 mg/dL (ref 0.44–1.00)
GFR calc Af Amer: >60 mL/min (ref >60)
GFR calc non Af Amer: >60 mL/min (ref >60)
Glucose, Bld: 107 mg/dL — ABNORMAL HIGH (ref 70–99)
Potassium: 3.3 mmol/L — ABNORMAL LOW (ref 3.5–5.1)
Sodium: 138 mmol/L (ref 135–145)
Total Bilirubin: 0.7 mg/dL (ref 0.3–1.2)
Total Protein: 5.6 g/dL — ABNORMAL LOW (ref 6.5–8.1)

## 2019-02-04 LAB — URIC ACID: Uric Acid, Serum: 3.9 mg/dL (ref 2.5–7.1)

## 2019-02-04 LAB — PHOSPHORUS: Phosphorus: 3.5 mg/dL (ref 2.5–4.6)

## 2019-02-04 LAB — MAGNESIUM: Magnesium: 2.3 mg/dL (ref 1.7–2.4)

## 2019-02-04 MED ORDER — HEPARIN SOD (PORK) LOCK FLUSH 100 UNIT/ML IV SOLN
500.0000 [IU] | INTRAVENOUS | Status: DC | PRN
  Filled 2019-02-04: qty 5

## 2019-02-04 MED ORDER — POTASSIUM CHLORIDE CRYS ER 20 MEQ PO TBCR: 20.0000 meq | EXTENDED_RELEASE_TABLET | Freq: Every day | ORAL | 1 refills | Status: DC

## 2019-02-04 MED ORDER — VITAMIN D 25 MCG (1000 UNIT) PO TABS: 1000.0000 [IU] | ORAL_TABLET | Freq: Every day | ORAL | 3 refills | Status: AC

## 2019-02-04 MED ORDER — HEPARIN SOD (PORK) LOCK FLUSH 100 UNIT/ML IV SOLN: 500.0000 [IU] | INTRAVENOUS | Status: DC

## 2019-02-04 NOTE — Telephone Encounter (Signed)
Scheduled appt per 9/15 sch message - pt daughter Yasmin is aware of appts

## 2019-02-04 NOTE — Progress Notes (Signed)
Pt discharged home in stable condition after completion of chemo. Discharge instructions given. Scripts sent to pharmacy of choice. No immediate questions or concerns at this time. Discharged from unit via wheelchair.

## 2019-02-04 NOTE — Progress Notes (Signed)
Checked Cytoxan independently with Laural Benes, RN based on patient's BSA and normal dosing.  Expiration date is fine.

## 2019-02-04 NOTE — Discharge Summary (Signed)
.  [place]  [phone] [phone]    Physician Discharge Summary     Patient ID: Denise Walls MRN: [id number] DOB/AGE: Feb 12, 1961 58 y.o.  Admit date: 01/31/2019 Discharge date: 02/04/2019  Primary Care Physician:  [name], MD   Discharge Diagnoses:    Present on Admission:  DLBCL (diffuse large B cell lymphoma) [facility])   Discharge Medications:  Allergies as of 02/04/2019      Reactions   Nsaids Anaphylaxis, Other (See Comments)   Abdominal pain, GI Bleeding      Medication List    TAKE these medications   calcium carbonate 500 MG chewable tablet Commonly known as: TUMS - dosed in mg elemental calcium Chew 2 tablets by mouth daily as needed for indigestion or heartburn.   cholecalciferol 25 MCG (1000 UT) tablet Commonly known as: VITAMIN D3 Take 1 tablet (1,000 Units total) by mouth daily.   lidocaine-prilocaine cream Commonly known as: EMLA Apply to affected area once What changed:   how much to take  how to take this  when to take this  reasons to take this  additional instructions   meclizine 25 MG tablet Commonly known as: ANTIVERT Take 1 tablet (25 mg total) by mouth 3 (three) times daily as needed for dizziness.   Melatonin 3 MG Tabs Take 3 mg by mouth at bedtime as needed (sleep).   mesalamine 1.2 g EC tablet Commonly known as: LIALDA Take 2.4 g by mouth daily.   multivitamin-prenatal 27-0.8 MG Tabs tablet Take 1 tablet by mouth daily at 12 noon.   ondansetron 8 MG tablet Commonly known as: Zofran Take 1 tablet (8 mg total) by mouth 2 (two) times daily as needed for refractory nausea / vomiting. Start on day 3 after cyclophosphamide chemotherapy.   polyethylene glycol 17 g packet Commonly known as: MIRALAX / GLYCOLAX Take 17 g by mouth daily as needed for mild constipation.   potassium chloride SA 20 MEQ tablet Commonly known as: K-DUR Take 1 tablet (20 mEq total)  by mouth daily.   traZODone 50 MG tablet Commonly known as: DESYREL Take 1 tablet (50 mg total) by mouth at bedtime as needed for sleep. What changed: how much to take        Disposition and Follow-up:   Significant Diagnostic Studies:  Mr Jeri Cos Wo Contrast  Result Date: 01/27/2019 CLINICAL DATA:  Vertigo episodic. History of lymphoma. Malignant pleural effusion. Crohn's disease. EXAM: MRI HEAD WITHOUT AND WITH CONTRAST TECHNIQUE: Multiplanar, multiecho pulse sequences of the brain and surrounding structures were obtained without and with intravenous contrast. CONTRAST:  58m GADAVIST GADOBUTROL 1 MMOL/ML IV SOLN COMPARISON:  None. FINDINGS: Brain: On the initial scan, the tech inadvertently did not perform coronal postcontrast imaging. The patient returned for postcontrast sagittal coronal and axial imaging as there was question of enhancing lesion in the left anterior putamen on the initial study. This area is not seen on repeat imaging and is felt to be artifact on the initial study. No enhancing mass lesion identified. Ventricle size normal. Cerebral volume normal. Negative for acute infarct. Mild periventricular white matter hyperintensity bilaterally appears chronic. No hemorrhage mass or edema. Vascular: Normal arterial flow voids. Skull and upper cervical spine: Negative Sinuses/Orbits: Mucosal edema paranasal sinuses.  Normal orbit Other: None IMPRESSION: No acute abnormality.  No acute infarct or mass Repeat imaging fails to confirm a small enhancing lesion left anterior putamen which is felt to be an artifact on the  initial axial sequence postcontrast. Electronically Signed   By: Franchot Gallo M.D.   On: 01/27/2019 19:22    Discharge Laboratory Values: . CBC Latest Ref Rng & Units 02/04/2019 02/03/2019 02/02/2019  WBC 4.0 - 10.5 K/uL 3.1(L) 4.7 8.2  Hemoglobin 12.0 - 15.0 g/dL 9.3(L) 9.6(L) 9.4(L)  Hematocrit 36.0 - 46.0 % 29.0(L) 29.8(L) 29.2(L)  Platelets 150 - 400 K/uL 398  418(H) 395   . CMP Latest Ref Rng & Units 02/04/2019 02/03/2019 02/02/2019  Glucose 70 - 99 mg/dL 107(H) 113(H) 126(H)  BUN 6 - 20 mg/dL 17 16 15   Creatinine 0.44 - 1.00 mg/dL 0.63 0.62 0.59  Sodium 135 - 145 mmol/L 138 137 138  Potassium 3.5 - 5.1 mmol/L 3.3(L) 3.4(L) 3.7  Chloride 98 - 111 mmol/L 106 103 105  CO2 22 - 32 mmol/L 25 25 23   Calcium 8.9 - 10.3 mg/dL 8.6(L) 8.7(L) 9.1  Total Protein 6.5 - 8.1 g/dL 5.6(L) 5.8(L) 5.7(L)  Total Bilirubin 0.3 - 1.2 mg/dL 0.7 0.4 0.4  Alkaline Phos 38 - 126 U/L 60 68 70  AST 15 - 41 U/L 14(L) 13(L) 13(L)  ALT 0 - 44 U/L 18 16 16     Brief H and P: For complete details please refer to admission H and P, but in brief,  Denise Walls is a 58 year old female with a past medical history significant for anxiety, Crohn's disease, hypertension, hyperlipidemia, migraine headaches.  The patient's history is obtained through a Spanish interpreter via video.  The patient reports that she has history of Crohn's disease, for which she has been taking mesalamine with very good control of her symptoms. She was previously followed by Dr. Kathy Breach gastroenterology and to transition her care to a different gastroenterologist after Dr.[facility]ptamoved to [facility]. Her new gastroenterologist stopped her mesalamine some time in 2019,and soon after that, she began to develop new onset left lower quadrant discomfort/pain,nonradiating, dull, intermittent, mild to moderate in intensity, exacerbated by eating and improved with defecation. She underwent colonoscopy, which was unremarkable, and was recommended to modify her diet without any improvement. She then re-established her care with Dr. Etter Sjogren ordered CT abdomen/pelvis that showed diffuse abdominal lymphadenopathy. PET showed diffuse lymph node involvement in the neck, chest, abdomen and the pelvis,and patient was referred to oncology for further evaluation.  The patient has a malignant left pleural effusion secondary  to her lymphoma and has under gone to ultrasound-guided thoracenteses. The patient initially received 1 cycle of R-CHOP which she tolerated fairly well with exception of hyponatremia and neutropenia.  Prior to her second planned cycle of chemotherapy, she was found to be positive for Myc and BCL-2 rearrangement.  She is now receiving  R-EPOCH and is here for cycle #2 of his chemotherapy.  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  Has had some low potassium levels related to high-dose steroids which have been replaced and has had some chemotherapy related anemia which is stable and has not needed transfusions at this time. -  She will proceed with day 4 cycle 2 ofEPOCHtoday as scheduled.; she will return to clinic on 02/07/2019 for outpatient Rituximab and Udenyca -She will follow-up with medical oncology clinic with labs in about 10 days -We will plan for restaging PET scan following cycle 2 of EPOCH-R (I.e 3 cycles of  chemotherapy) of her chemotherapy.  This has been ordered -She has antiemetics available to her. -We will plan to proceed with IT methotrexate for CNS prophylaxis given double hit lymphoma with next cycle.  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  This remains overall stable. -Patient denies any symptoms of bleeding -We will monitor it for now; continue daily CBC with differential  Hyponatremia -Sodium level has normalized -We will monitor it  closely  Hypokalemia-likely due to high-dose steroids -Potassium down to 3.3 this morning -Needed potassium replacement in the hospital and will continue on discharge  Chemotherapy-associated nausea  -Secondary to chemotherapy -Symptoms relatively well controlled  -Continue PRN-anti-emetics  Constipation -Improved after receiving MiraLAX yesterday.  She has PRN MiraLAX available to her.  Physical Exam at Discharge: BP 126/73 (BP Location: Left Arm)    Pulse 74    Temp 97.6 F (36.4 C) (Oral)    Resp 18    Ht 4' 11"  (1.499 m)    Wt 134 lb 7.7 oz (61 kg)    LMP  (LMP Unknown)    SpO2 97%    BMI 27.16 kg/m  . GENERAL:alert, in no acute distress and comfortable SKIN: no acute rashes, no significant lesions EYES: conjunctiva are pink and non-injected, sclera anicteric OROPHARYNX: MMM, no exudates, no oropharyngeal erythema or ulceration NECK: supple, no JVD LYMPH:  no palpable lymphadenopathy in the cervical, axillary or inguinal regions LUNGS: clear to auscultation b/l with normal respiratory effort HEART: regular rate & rhythm ABDOMEN:  normoactive bowel sounds , non tender, not distended. Extremity: no pedal edema PSYCH: alert & oriented x 3 with fluent speech NEURO: no focal motor/sensory deficits  Hospital Course:  Active Problems:   DLBCL (diffuse large B cell lymphoma) (HCC)   Anemia   Diet: Regular diet  Activity: May continue daily activities.  Counseled extensively on infection prevention strategies and appropriate COVID-19 specific infection prevention recommendations  Condition at Discharge:   Stable  Signed: Dr. Sullivan Lone MD [place] [phone]  02/04/2019, 3:42 PM Total time spent discharging patient more than 30 minutes

## 2019-02-07 ENCOUNTER — Other Ambulatory Visit: Payer: Self-pay

## 2019-02-07 ENCOUNTER — Inpatient Hospital Stay: Payer: Commercial Managed Care - PPO | Attending: Hematology

## 2019-02-07 ENCOUNTER — Other Ambulatory Visit: Payer: Self-pay | Admitting: Hematology

## 2019-02-07 VITALS — BP 120/77 | HR 83 | Temp 99.2°F | Resp 17 | Wt 132.5 lb

## 2019-02-07 DIAGNOSIS — Z5189 Encounter for other specified aftercare: Secondary | ICD-10-CM | POA: Diagnosis not present

## 2019-02-07 DIAGNOSIS — C8330 Diffuse large B-cell lymphoma, unspecified site: Secondary | ICD-10-CM

## 2019-02-07 DIAGNOSIS — C8339 Diffuse large B-cell lymphoma, extranodal and solid organ sites: Secondary | ICD-10-CM | POA: Insufficient documentation

## 2019-02-07 DIAGNOSIS — Z5112 Encounter for antineoplastic immunotherapy: Secondary | ICD-10-CM | POA: Insufficient documentation

## 2019-02-07 MED ORDER — PEGFILGRASTIM-CBQV 6 MG/0.6ML ~~LOC~~ SOSY
PREFILLED_SYRINGE | SUBCUTANEOUS | Status: AC
  Filled 2019-02-07: qty 0.6

## 2019-02-07 MED ORDER — DIPHENHYDRAMINE HCL 25 MG PO CAPS
ORAL_CAPSULE | ORAL | Status: AC
  Filled 2019-02-07: qty 2

## 2019-02-07 MED ORDER — PEGFILGRASTIM-CBQV 6 MG/0.6ML ~~LOC~~ SOSY
6.0000 mg | PREFILLED_SYRINGE | Freq: Once | SUBCUTANEOUS | Status: AC
  Administered 2019-02-07: 6 mg via SUBCUTANEOUS

## 2019-02-07 MED ORDER — ACETAMINOPHEN 325 MG PO TABS
650.0000 mg | ORAL_TABLET | Freq: Once | ORAL | Status: AC
  Administered 2019-02-07: 650 mg via ORAL

## 2019-02-07 MED ORDER — ACETAMINOPHEN 325 MG PO TABS
ORAL_TABLET | ORAL | Status: AC
  Filled 2019-02-07: qty 2

## 2019-02-07 MED ORDER — SODIUM CHLORIDE 0.9 % IV SOLN
Freq: Once | INTRAVENOUS | Status: AC
  Administered 2019-02-07: 08:00:00 via INTRAVENOUS
  Filled 2019-02-07: qty 250

## 2019-02-07 MED ORDER — HEPARIN SOD (PORK) LOCK FLUSH 100 UNIT/ML IV SOLN
500.0000 [IU] | Freq: Once | INTRAVENOUS | Status: AC | PRN
  Administered 2019-02-07: 500 [IU]
  Filled 2019-02-07: qty 5

## 2019-02-07 MED ORDER — DIPHENHYDRAMINE HCL 25 MG PO CAPS
50.0000 mg | ORAL_CAPSULE | Freq: Once | ORAL | Status: AC
  Administered 2019-02-07: 50 mg via ORAL

## 2019-02-07 MED ORDER — SODIUM CHLORIDE 0.9% FLUSH
10.0000 mL | INTRAVENOUS | Status: DC | PRN
  Administered 2019-02-07: 12:00:00 10 mL
  Filled 2019-02-07: qty 10

## 2019-02-07 MED ORDER — SODIUM CHLORIDE 0.9 % IV SOLN
375.0000 mg/m2 | Freq: Once | INTRAVENOUS | Status: AC
  Administered 2019-02-07: 600 mg via INTRAVENOUS
  Filled 2019-02-07: qty 50

## 2019-02-07 NOTE — Patient Instructions (Addendum)
[facility] Discharge Instructions for Patients Receiving Chemotherapy  Today you received the following chemotherapy agents: Rituxan.  To help prevent nausea and vomiting after your treatment, we encourage you to take your nausea medication as directed.   If you develop nausea and vomiting that is not controlled by your nausea medication, call the clinic.   BELOW ARE SYMPTOMS THAT SHOULD BE REPORTED IMMEDIATELY:  *FEVER GREATER THAN 100.5 F  *CHILLS WITH OR WITHOUT FEVER  NAUSEA AND VOMITING THAT IS NOT CONTROLLED WITH YOUR NAUSEA MEDICATION  *UNUSUAL SHORTNESS OF BREATH  *UNUSUAL BRUISING OR BLEEDING  TENDERNESS IN MOUTH AND THROAT WITH OR WITHOUT PRESENCE OF ULCERS  *URINARY PROBLEMS  *BOWEL PROBLEMS  UNUSUAL RASH Items with * indicate a potential emergency and should be followed up as soon as possible.  Feel free to call the clinic should you have any questions or concerns. The clinic phone number is [phone].  Please show the [place] at check-in to the Emergency Department and triage nurse.  Rituximab injection Qu es este medicamento? El RITUXIMAB es un anticuerpo monoclonal. Se utiliza para tratar ciertos tipos de cncer, tales como el linfoma no Hodgkin y la leucemia linfoctica crnica. Tambin se [place] para tratar la artritis reumatoide, la poliangitis granulomatosa (o granulomatosis de Wegener), la poliangitis microscpica y el pnfigo vulgar. Este medicamento puede ser utilizado para otros usos; si tiene alguna pregunta consulte con su proveedor de atencin mdica o con su farmacutico. MARCAS COMUNES: Rituxan, RUXIENCE Qu le debo informar a mi profesional de la salud antes de tomar este medicamento? Necesitan saber si usted presenta alguno de los siguientes problemas o situaciones: enfermedad cardiaca infeccin (especialmente infecciones virales, como hepatitis B, varicela, fuegos labiales o herpes) problemas del sistema inmunolgico  ritmo cardiaco irregular enfermedad renal recuentos sanguneos bajos, como baja cantidad de glbulos blancos, plaquetas o glbulos rojos enfermedad pulmonar o respiratoria, como asma recientemente recibi o tiene programado recibir una vacuna una reaccin alrgica o inusual al rituximab, a otros medicamentos, alimentos, colorantes o conservantes si est embarazada o buscando quedar embarazada si est amamantando a un beb Cmo debo utilizar este medicamento? Este medicamento se administra mediante infusin por va intravenosa. Lo administra un profesional de Technical sales engineer calificado en un hospital o en un entorno clnico. Su farmacutico le dar una [place] del medicamento especial con cada receta y relleno. Asegrese de leer esta informacin cada vez cuidadosamente. Hable con su pediatra para informarse acerca del uso de este medicamento en nios. Este medicamento no est aprobado para uso en nios. Sobredosis: Pngase en contacto inmediatamente con un centro toxicolgico o una sala de urgencia si usted cree que haya tomado demasiado medicamento. ATENCIN: [employer] es solo para usted. No comparta este medicamento con nadie. Qu sucede si me olvido de una dosis? Es importante no olvidar ninguna dosis. Informe a su mdico o a su profesional de la salud si no puede asistir a Photographer. Qu puede interactuar con este medicamento? cisplatino vacunas de virus vivos Puede ser que esta lista no menciona todas las posibles interacciones. Informe a su profesional de [employer]	[place] de [employer] productos a base de hierbas, medicamentos de [place] o suplementos nutritivos que est tomando. Si usted fuma, consume bebidas alcohlicas o si utiliza drogas ilegales, indqueselo tambin a su profesional de KB Home	[place]. Algunas sustancias pueden interactuar con su medicamento. A qu debo estar atento al usar Coca[employer]a? Se supervisar su estado de salud atentamente mientras reciba este medicamento. Usted podra  necesitar realizarse anlisis de sangre mientras est usando [place]. Este medicamento puede causar Chief of Staff graves. Para reducir su riesgo, es posible que necesite tomar un medicamento antes del tratamiento con este medicamento. Tome su medicamento segn las instrucciones. En algunos pacientes, este medicamento puede causar una infeccin grave en el cerebro que puede llevar a la muerte. Si tiene cualquier problema para ver, pensar, hablar, caminar o pararse, infrmeselo a su profesional de [employer]	[place] de inmediato. Si no puede ponerse en contacto con su profesional de la salud, busque otra fuente de atencin mdica de [place] urgente. Consulte a su mdico o a su profesional de la salud si tiene fiebre, escalofros o dolor de garganta, o cualquier otro sntoma de resfro o gripe. No se trate usted mismo. Este medicamento reduce la capacidad del cuerpo para combatir infecciones. Trate de no acercarse a personas que estn enfermas. No debe quedar [employer] est [employer], o al menos por 12 meses despus de dejar de usarlo. Las mujeres deben informar a su mdico si estn buscando quedar embarazadas o si creen que podran estar embarazadas. Existe la posibilidad de efectos secundarios graves en un beb sin nacer. Para obtener ms informacin, hable con su profesional de la salud o su farmacutico. No debe Economist a un beb mientras est tomando este medicamento, o al menos por 6 meses despus de dejar de usarlo. Qu efectos secundarios puedo tener al [employer] este medicamento? Efectos secundarios que debe informar a su mdico o a Barrister's clerk de la salud tan pronto como sea posible: Chief of Staff, como erupcin cutnea, comezn/picazn o urticaria; hinchazn de la cara, los labios o la lengua problemas respiratorios dolor en el pecho cambios en la visin diarrea dolor de cabeza con fiebre, rigidez del cuello, sensibilidad a la luz, nuseas, o confusin ritmo  cardiaco rpido, irregular prdida de memoria recuentos sanguneos bajos: este medicamento podra reducir la cantidad de glbulos blancos, glbulos rojos y plaquetas. Su riesgo de infeccin y [place]. llagas en la boca problemas de equilibrio, del habla o al caminar enrojecimiento, formacin de ampollas, descamacin o distensin de la piel, incluso dentro de la boca signos de infeccin: fiebre o escalofros, tos, dolor de garganta, dolor o dificultad para orinar signos y sntomas de lesin al rin, tales como dificultad para orinar o cambios en la cantidad de orina signos y sntomas de lesin al hgado, como orina amarilla oscura o [place]; sensacin general de estar enfermo o sntomas gripales; heces claras; prdida del apetito; nuseas; dolor en la regin abdominal superior derecha; cansancio o debilidad inusual; color amarillento de los ojos o la piel signos y sntomas de presin sangunea baja, tales como [place], sensacin de [place] o aturdimiento, cadas, cansancio o debilidad inusual dolor estomacal hinchazn de tobillos, pies, manos sangrado o moretones inusuales vmito Efectos secundarios que generalmente no requieren atencin mdica (infrmelos a su mdico o a Barrister's clerk de la salud si persisten o si son molestos): dolor de Special educational needs teacher en las articulaciones calambres o dolores musculares nuseas cansancio Puede ser que esta lista no menciona todos los posibles efectos secundarios. Comunquese a su mdico por asesoramiento mdico [employer] Usted puede informar los efectos secundarios a la FDA por telfono al 1-800-FDA-1088. Dnde debo guardar mi medicina? Este medicamento se administra en hospitales o clnicas y no necesitar guardarlo en su domicilio. ATENCIN: Este folleto es un resumen. Puede ser que no cubra toda la posible informacin. Si usted tiene preguntas acerca de esta medicina, consulte  con su mdico, su farmacutico o su profesional de [employer].  2020 Elsevier/Gold Standard (2018-08-23 00:00:00)  Pegfilgrastim injection Qu es este medicamento? El PEGFILGRASTIM es un factor estimulante de colonias de granulocitos de accin prolongada que estimula el crecimiento de los neutrfilos, un tipo de glbulo blanco importante en la lucha del cuerpo contra la infeccin. Se utiliza para reducir la incidencia de la fiebre y la infeccin en pacientes con ciertos tipos de cncer que reciben quimioterapia que afecta la mdula sea, y para aumentar la supervivencia despus de estar expuesto a altas dosis de radiacin. Este medicamento puede ser utilizado para otros usos; si tiene alguna pregunta consulte con su proveedor de atencin mdica o con su farmacutico. MARCAS COMUNES: Fulphila, Neulasta, UDENYCA, Ziextenzo Qu le debo informar a mi profesional de la salud antes de tomar este medicamento? Necesita saber si usted presenta alguno de los siguientes problemas o situaciones:  enfermedad renal  alergia al ltex  si actualmente recibe radioterapia  anemia drepanoctica  reacciones cutneas a Therapist, music acrlicos (On-Body Injector solamente, su nombre en ingls)  una reaccin alrgica o inusual al pegfilgrastim, al filgrastim, a otros medicamentos, alimentos, colorantes o conservantes  si est embarazada o buscando quedar embarazada  si est amamantando a un beb Cmo debo utilizar este medicamento? Este medicamento se administra mediante una inyeccin por va subcutnea. Si recibe [employer] en su casa, le ensearn cmo preparar y Architectural technologist la jeringa prellenada o cmo usar el inyector en el cuerpo (su nombre en ingls, On-body Injector). Consulte las instrucciones de uso para el paciente para obtener instrucciones detalladas. selo exactamente como se le indique. Informe de inmediato a su proveedor de atencin de la salud si sospecha que su inyector en el cuerpo (en ingls "On-body Injector") puede haber funcionado  incorrectamente o si sospecha que el uso de su inyector en el cuerpo hizo que usted recibiera una dosis parcial o no recibiera una dosis. Es importante que deseche las agujas y las jeringas usadas en un recipiente resistente a los pinchazos. No los deseche en la basura. Si no tiene un recipiente resistente a los pinchazos, consulte a Midwife o proveedor de atencin de la salud para obtenerlo. Hable con su pediatra para informarse acerca del uso de este medicamento en nios. Aunque este medicamento se puede Advertising account executive, existen precauciones que deben tomarse. Sobredosis: Pngase en contacto inmediatamente con un centro toxicolgico o una sala de urgencia si usted cree que haya tomado demasiado medicamento. ATENCIN: [employer] es solo para usted. No comparta este medicamento con nadie. Qu sucede si me olvido de una dosis? Es importante no olvidar ninguna dosis. Comunquese con su mdico o profesional de la salud si se olvida una dosis. Si se olvida una dosis debido a un fallo del Journalist, newspaper cuerpo (su nombre en ingls, On-body Injector). o fugas, una nueva dosis debe ser administrada tan pronto como sea posible usando una sola [place] prellenada para uso manual. Qu puede interactuar con este medicamento? No se han estudiado las interacciones. Puede ser que esta lista no menciona todas las posibles interacciones. Informe a su profesional de KB Home	[place] de [employer] productos a base de hierbas, medicamentos de [place] o suplementos nutritivos que est tomando. Si usted fuma, consume bebidas alcohlicas o si utiliza drogas ilegales, indqueselo tambin a su profesional de KB Home	[place]. Algunas sustancias pueden interactuar con su medicamento. A qu debo estar atento al usar Coca-Cola? Usted podr necesitar realizarse anlisis de [employer] est  usando Coca[employer]Cola. Si va a someterse a un IRM (MRI), tomografa computarizada u otro procedimiento, informe a su  mdico que est usando este medicamento (su nombre en ingls, On-body Injector solamente). Qu efectos secundarios puedo tener al [employer] este medicamento? Efectos secundarios que debe informar a su mdico o a Barrister's clerk de la salud tan pronto como sea posible: Chief of Staff, como erupcin cutnea, comezn/picazn o urticaria, e hinchazn de la cara, los labios o la Holiday representative de espalda mareos fiebre Social research officer, government, enrojecimiento o Actor de la inyeccin puntos rojos en la piel orina roja o Forensic psychologist oscuro falta de aire o problemas respiratorios dolor de Paramedic o del costado, o Social research officer, government en el hombro hinchazn cansancio dificultad para Garment/textile technologist o cambios en el volumen de orina Efectos secundarios que generalmente no requieren atencin mdica (infrmelos a su mdico o a Barrister's clerk de la salud si persisten o si son molestos): dolor de Surveyor, quantity Puede ser que esta lista no menciona todos los posibles efectos secundarios. Comunquese a su mdico por asesoramiento mdico [employer]. Usted puede informar los efectos secundarios a la FDA por telfono al 1-800-FDA-1088. Dnde debo guardar mi medicina? Mantenga fuera del alcance de los nios. Si est [employer] en su casa, le indicarn cmo guardarlo. Deseche todo el medicamento que no haya utilizado despus de la fecha de vencimiento indicada en la etiqueta. ATENCIN: Este folleto es un resumen. Puede ser que no cubra toda la posible informacin. Si usted tiene preguntas acerca de esta medicina, consulte con su mdico, su farmacutico o su profesional de Technical sales engineer.  2020 Elsevier/Gold Standard (2017-10-19 00:00:00)

## 2019-02-10 DIAGNOSIS — E876 Hypokalemia: Secondary | ICD-10-CM

## 2019-02-14 ENCOUNTER — Encounter (HOSPITAL_COMMUNITY)
Admission: RE | Admit: 2019-02-14 | Discharge: 2019-02-14 | Disposition: A | Discharge status: Home / Self Care | Payer: Commercial Managed Care - PPO | Source: Ambulatory Visit | Attending: Hematology | Admitting: Hematology

## 2019-02-14 ENCOUNTER — Other Ambulatory Visit: Payer: Self-pay

## 2019-02-14 DIAGNOSIS — C8330 Diffuse large B-cell lymphoma, unspecified site: Secondary | ICD-10-CM | POA: Diagnosis not present

## 2019-02-14 LAB — GLUCOSE, CAPILLARY: Glucose-Capillary: 98 mg/dL (ref 70–99)

## 2019-02-14 MED ORDER — FLUDEOXYGLUCOSE F - 18 (FDG) INJECTION
6.5900 | Freq: Once | INTRAVENOUS | Status: AC | PRN
  Administered 2019-02-14: 6.59 via INTRAVENOUS

## 2019-02-15 ENCOUNTER — Inpatient Hospital Stay: Payer: Commercial Managed Care - PPO

## 2019-02-15 ENCOUNTER — Telehealth: Payer: Self-pay | Admitting: Hematology

## 2019-02-15 ENCOUNTER — Inpatient Hospital Stay (HOSPITAL_BASED_OUTPATIENT_CLINIC_OR_DEPARTMENT_OTHER): Payer: Commercial Managed Care - PPO | Admitting: Hematology

## 2019-02-15 VITALS — BP 142/87 | HR 108 | Temp 98.7°F | Resp 18 | Ht 59.0 in | Wt 133.8 lb

## 2019-02-15 DIAGNOSIS — D649 Anemia, unspecified: Secondary | ICD-10-CM

## 2019-02-15 DIAGNOSIS — C8339 Diffuse large B-cell lymphoma, extranodal and solid organ sites: Secondary | ICD-10-CM | POA: Diagnosis not present

## 2019-02-15 DIAGNOSIS — Z5112 Encounter for antineoplastic immunotherapy: Secondary | ICD-10-CM | POA: Diagnosis not present

## 2019-02-15 LAB — CBC WITH DIFFERENTIAL (CANCER CENTER ONLY)
Abs Immature Granulocytes: 2.8 10*3/uL — ABNORMAL HIGH (ref 0.00–0.07)
Basophils Absolute: 0.2 10*3/uL — ABNORMAL HIGH (ref 0.0–0.1)
Basophils Relative: 1 %
Eosinophils Absolute: 0.1 10*3/uL (ref 0.0–0.5)
Eosinophils Relative: 0 %
HCT: 28.6 % — ABNORMAL LOW (ref 36.0–46.0)
Hemoglobin: 9.3 g/dL — ABNORMAL LOW (ref 12.0–15.0)
Immature Granulocytes: 11 %
Lymphocytes Relative: 4 %
Lymphs Abs: 1 10*3/uL (ref 0.7–4.0)
MCH: 29.4 pg (ref 26.0–34.0)
MCHC: 32.5 g/dL (ref 30.0–36.0)
MCV: 90.5 fL (ref 80.0–100.0)
Monocytes Absolute: 2.7 10*3/uL — ABNORMAL HIGH (ref 0.1–1.0)
Monocytes Relative: 11 %
Neutro Abs: 18.1 10*3/uL — ABNORMAL HIGH (ref 1.7–7.7)
Neutrophils Relative %: 73 %
Platelet Count: 195 10*3/uL (ref 150–400)
RBC: 3.16 MIL/uL — ABNORMAL LOW (ref 3.87–5.11)
RDW: 22.8 % — ABNORMAL HIGH (ref 11.5–15.5)
WBC Count: 24.8 10*3/uL — ABNORMAL HIGH (ref 4.0–10.5)
nRBC: 0.8 % — ABNORMAL HIGH (ref 0.0–0.2)

## 2019-02-15 LAB — CMP (CANCER CENTER ONLY)
ALT: 28 U/L (ref 0–44)
AST: 20 U/L (ref 15–41)
Albumin: 4 g/dL (ref 3.5–5.0)
Alkaline Phosphatase: 153 U/L — ABNORMAL HIGH (ref 38–126)
Anion gap: 10 (ref 5–15)
BUN: 9 mg/dL (ref 6–20)
CO2: 27 mmol/L (ref 22–32)
Calcium: 9.2 mg/dL (ref 8.9–10.3)
Chloride: 97 mmol/L — ABNORMAL LOW (ref 98–111)
Creatinine: 0.81 mg/dL (ref 0.44–1.00)
GFR, Est AFR Am: >60 mL/min (ref >60)
GFR, Estimated: >60 mL/min (ref >60)
Glucose, Bld: 99 mg/dL (ref 70–99)
Potassium: 4.1 mmol/L (ref 3.5–5.1)
Sodium: 134 mmol/L — ABNORMAL LOW (ref 135–145)
Total Bilirubin: <0.2 mg/dL — ABNORMAL LOW (ref 0.3–1.2)
Total Protein: 6.7 g/dL (ref 6.5–8.1)

## 2019-02-15 NOTE — Progress Notes (Signed)
HEMATOLOGY/ONCOLOGY CONSULTATION NOTE  Date of Service: 02/15/2019  Patient Care Team: [name], MD as PCP - General (Family Medicine)  CHIEF COMPLAINTS/PURPOSE OF CONSULTATION:  DLBCL (diffuse large B cell lymphoma)   HISTORY OF PRESENTING ILLNESS:  Denise Walls is a wonderful 58 y.o. female with a past medical history significant for anxiety, Crohn's disease, hypertension, hyperlipidemia, migraine headaches.  The patient's history is obtained through a Spanish interpreter via video, [place].  The patient reports that she has history of Crohn's disease, for which she has been taking mesalamine with very good control of her symptoms. She was previously followed by Dr. Kathy Breach gastroenterology and to transition her care to a different gastroenterologist after Dr.Guptamoved to [facility]. Her new gastroenterologist stopped her mesalamine some time in 2019,and soon after that, she began to develop new onset left lower quadrant discomfort/pain,nonradiating, dull, intermittent, mild to moderate in intensity, exacerbated by eating and improved with defecation. She underwent colonoscopy, which was unremarkable, and was recommended to modify her diet without any improvement. She then re-established her care with Dr. Etter Sjogren ordered CT abdomen/pelvis that showed diffuse abdominal lymphadenopathy. PET showed diffuse lymph node involvement in the neck, chest, abdomen and the pelvis,and patient was referred to oncology for further evaluation.  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 The patient initially received 1 cycle of R-CHOP which she tolerated fairly well with exception of hyponatremia and neutropenia.  Prior to her second planned cycle of chemotherapy, she was found to be positive for Myc and BCL-2 rearrangement.  She is now receiving  R-EPOCH and is here for cycle #2 of his  chemotherapy.  Today, the patient reports that she is feeling well overall.  She reports a very good appetite and has been eating a lot at home.  She reports that her breathing is stable and maybe even slightly improved.  Had an episode of dizziness and vomiting and was seen in the ER last week. Symptoms have now resolved. Today, she denies any dizziness, headaches, chest discomfort, cough.  Denies abdominal pain, nausea, vomiting, constipation, diarrhea.  Denies bleeding.  The patient is here for admission for cycle #2 of R-EPOCH.  INTERVAL HISTORY:   Denise Walls is a wonderful 59 y.o. female who is here for evaluation and management of DLBCL. We are joined today by her interpreter and her daughter Denise Walls, via phone. The patient was last seen in the hospital on 02/04/2019. The pt reports that she is doing well overall.  The pt reports that she is doing well but has been stressed due her diagnosis of DLBCL. Pt notes some numbness in her fingers, worse in her left hand. There is no associated pain. She massages them to help with the numbness. She has not been experiencing any dizziness and has been eating well.   Of note since the patient's last visit, pt has had PET Scan Whole Body ([id number]) completed on 02/14/2019 with results revealing "1. Findings favor complete metabolic response. No residual hypermetabolism within the lymph nodes of the neck, chest, abdomen or pelvis, which have all decreased in size in the interval. 2. Nonspecific new diffuse skeletal hypermetabolism and new mild splenic hypermetabolism, favor reactive state of the marrow and reticuloendothelial system. Spleen is normal size. Continued surveillance with CT or PET-CT advised. 3. Small layering left pleural effusion, decreased. 4.  Aortic Atherosclerosis (ICD10-I70.0)."  Lab results today (02/15/19) of CBC w/diff and CMP is  as follows: all values are WNL except for WBC at 24.8K, RBC at 3.16, Hgb at 9.3, HCT at 28.6 RDW  at 22.8, nRBC at 0.8, Neutro Abs at 18.1K, Monocytes Abs at 2.7K, Basophils Abs at 0.2K, Abs Immature Granulocytes at 2.60K, Polychromasia is "PRESENT", Ovalocytes are "PRESENT", Sodium at 134, Chloride at 97, Alkaline Phosphatase at 153, Total Bilirubin at <0.2.  On review of systems, pt reports numbness in her fingers and denies finger pain, dizziness and any other symptoms.    MEDICAL HISTORY:  Past Medical History:  Diagnosis Date   Acute cystitis    Anxiety    Arthritis    Crohn disease ([place])    dx 2004, history of small bowel obstruction 02/2007 treated conservatively   History of colon polyps    History of shingles    HTN (hypertension)    Hypercholesterolemia    Insomnia    Migraine    Uterine fibroid     SURGICAL HISTORY: Past Surgical History:  Procedure Laterality Date   COLONOSCOPY  09/11/2014   Small internal hemorrhoids. Otherwise normal colonoscopy to terminal ileum   COLONOSCOPY  10/02/2017   ESOPHAGOGASTRODUODENOSCOPY  04/05/2007   Mild gastritis. Otherwise, normal esophagogastroduodenoscopy   IR IMAGING GUIDED PORT INSERTION  11/29/2018   LYMPH NODE BIOPSY Left 12/09/2018   Procedure: LEFT DEEP CERVICAL LYMPH NODE BIOPSY;  Surgeon: Fanny Skates, MD;  Location: [facility];  Service: General;  Laterality: Left;   TUBAL LIGATION      SOCIAL HISTORY: Social History   Socioeconomic History   Marital status: Married    Spouse name: Not on file   Number of children: Not on file   Years of education: Not on file   Highest education level: Not on file  Occupational History   Not on file  Social Needs   Financial resource strain: Not on file   Food insecurity    Worry: Not on file    Inability: Not on file   Transportation needs    Medical: Not on file    Non-medical: Not on file  Tobacco Use   Smoking status: Never Smoker   Smokeless tobacco: Never Used  Substance and Sexual Activity   Alcohol use: Never    Frequency: Never    Drug use: Never   Sexual activity: Yes    Birth control/protection: Post-menopausal  Lifestyle   Physical activity    Days per week: Not on file    Minutes per session: Not on file   Stress: Not on file  Relationships   Social connections    Talks on phone: Not on file    Gets together: Not on file    Attends religious service: Not on file    Active member of club or organization: Not on file    Attends meetings of clubs or organizations: Not on file    Relationship status: Not on file   Intimate partner violence    Fear of current or ex partner: Not on file    Emotionally abused: Not on file    Physically abused: Not on file    Forced sexual activity: Not on file  Other Topics Concern   Not on file  Social History Narrative   Not on file    FAMILY HISTORY: Family History  Problem Relation Age of Onset   Colon cancer Neg Hx     ALLERGIES:  is allergic to nsaids.  MEDICATIONS:  Current Outpatient Medications  Medication Sig Dispense Refill   calcium  carbonate (TUMS - DOSED IN MG ELEMENTAL CALCIUM) 500 MG chewable tablet Chew 2 tablets by mouth daily as needed for indigestion or heartburn.     cholecalciferol (VITAMIN D3) 25 MCG (1000 UT) tablet Take 1 tablet (1,000 Units total) by mouth daily. 30 tablet 3   lidocaine-prilocaine (EMLA) cream Apply to affected area once (Patient taking differently: Apply 1 application topically as needed (For port-a-cath.). ) 30 g 3   meclizine (ANTIVERT) 25 MG tablet Take 1 tablet (25 mg total) by mouth 3 (three) times daily as needed for dizziness. (Patient not taking: Reported on 01/31/2019) 30 tablet 0   Melatonin 3 MG TABS Take 3 mg by mouth at bedtime as needed (sleep).     mesalamine (LIALDA) 1.2 g EC tablet Take 2.4 g by mouth daily.      ondansetron (ZOFRAN) 8 MG tablet Take 1 tablet (8 mg total) by mouth 2 (two) times daily as needed for refractory nausea / vomiting. Start on day 3 after cyclophosphamide  chemotherapy. (Patient not taking: Reported on 01/31/2019) 30 tablet 1   polyethylene glycol (MIRALAX / GLYCOLAX) 17 g packet Take 17 g by mouth daily as needed for mild constipation.     potassium chloride SA (K-DUR) 20 MEQ tablet Take 1 tablet (20 mEq total) by mouth daily. 30 tablet 1   Prenatal Vit-Fe Fumarate-FA (MULTIVITAMIN-PRENATAL) 27-0.8 MG TABS tablet Take 1 tablet by mouth daily at 12 noon.     traZODone (DESYREL) 50 MG tablet Take 1 tablet (50 mg total) by mouth at bedtime as needed for sleep. (Patient taking differently: Take 25 mg by mouth at bedtime as needed for sleep. ) 30 tablet 3   No current facility-administered medications for this visit.     REVIEW OF SYSTEMS:    A 10+ POINT REVIEW OF SYSTEMS WAS OBTAINED including neurology, dermatology, psychiatry, cardiac, respiratory, lymph, extremities, GI, GU, Musculoskeletal, constitutional, breasts, reproductive, HEENT.  All pertinent positives are noted in the HPI.  All others are negative.   PHYSICAL EXAMINATION: ECOG PERFORMANCE STATUS: 2 - Symptomatic, <50% confined to bed  . Vitals:   02/15/19 1455  BP: (!) 142/87  Pulse: (!) 108  Resp: 18  Temp: 98.7 F (37.1 C)  SpO2: 99%   Filed Weights   02/15/19 1455  Weight: 133 lb 12.8 oz (60.7 kg)   .Body mass index is 27.02 kg/m.  GENERAL:alert, in no acute distress and comfortable SKIN: no acute rashes, no significant lesions EYES: conjunctiva are pink and non-injected, sclera anicteric OROPHARYNX: MMM, no exudates, no oropharyngeal erythema or ulceration NECK: supple, no JVD LYMPH:  no palpable lymphadenopathy in the cervical, axillary or inguinal regions LUNGS: clear to auscultation b/l with normal respiratory effort HEART: regular rate & rhythm ABDOMEN:  normoactive bowel sounds , non tender, not distended. Extremity: no pedal edema PSYCH: alert & oriented x 3 with fluent speech NEURO: no focal motor/sensory deficits  LABORATORY DATA:  I have reviewed  the data as listed  . CBC Latest Ref Rng & Units 02/15/2019 02/04/2019 02/03/2019  WBC 4.0 - 10.5 K/uL 24.8(H) 3.1(L) 4.7  Hemoglobin 12.0 - 15.0 g/dL 9.3(L) 9.3(L) 9.6(L)  Hematocrit 36.0 - 46.0 % 28.6(L) 29.0(L) 29.8(L)  Platelets 150 - 400 K/uL 195 398 418(H)    . CMP Latest Ref Rng & Units 02/15/2019 02/04/2019 02/03/2019  Glucose 70 - 99 mg/dL 99 107(H) 113(H)  BUN 6 - 20 mg/dL 9 17 16   Creatinine 0.44 - 1.00 mg/dL 0.81 0.63 0.62  Sodium 135 -  145 mmol/L 134(L) 138 137  Potassium 3.5 - 5.1 mmol/L 4.1 3.3(L) 3.4(L)  Chloride 98 - 111 mmol/L 97(L) 106 103  CO2 22 - 32 mmol/L 27 25 25   Calcium 8.9 - 10.3 mg/dL 9.2 8.6(L) 8.7(L)  Total Protein 6.5 - 8.1 g/dL 6.7 5.6(L) 5.8(L)  Total Bilirubin 0.3 - 1.2 mg/dL <0.2(L) 0.7 0.4  Alkaline Phos 38 - 126 U/L 153(H) 60 68  AST 15 - 41 U/L 20 14(L) 13(L)  ALT 0 - 44 U/L 28 18 16      RADIOGRAPHIC STUDIES: I have personally reviewed the radiological images as listed and agreed with the findings in the report. Mr Jeri Cos Wo Contrast  Result Date: 01/27/2019 CLINICAL DATA:  Vertigo episodic. History of lymphoma. Malignant pleural effusion. Crohn's disease. EXAM: MRI HEAD WITHOUT AND WITH CONTRAST TECHNIQUE: Multiplanar, multiecho pulse sequences of the brain and surrounding structures were obtained without and with intravenous contrast. CONTRAST:  63m GADAVIST GADOBUTROL 1 MMOL/ML IV SOLN COMPARISON:  None. FINDINGS: Brain: On the initial scan, the tech inadvertently did not perform coronal postcontrast imaging. The patient returned for postcontrast sagittal coronal and axial imaging as there was question of enhancing lesion in the left anterior putamen on the initial study. This area is not seen on repeat imaging and is felt to be artifact on the initial study. No enhancing mass lesion identified. Ventricle size normal. Cerebral volume normal. Negative for acute infarct. Mild periventricular white matter hyperintensity bilaterally appears chronic. No  hemorrhage mass or edema. Vascular: Normal arterial flow voids. Skull and upper cervical spine: Negative Sinuses/Orbits: Mucosal edema paranasal sinuses.  Normal orbit Other: None IMPRESSION: No acute abnormality.  No acute infarct or mass Repeat imaging fails to confirm a small enhancing lesion left anterior putamen which is felt to be an artifact on the initial axial sequence postcontrast. Electronically Signed   By: CFranchot GalloM.D.   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 EXAM: NUCLEAR MEDICINE PET SKULL BASE TO THIGH TECHNIQUE: 6.6 mCi F-18 FDG was injected intravenously. Full-ring PET imaging was performed from the skull base to thigh after the radiotracer. CT data was obtained and used for attenuation correction and anatomic localization. Fasting blood glucose: 98 mg/dl COMPARISON:  11/10/2018 PET-CT. FINDINGS: Mediastinal blood pool activity: SUV max 1.9 Liver activity: SUV max 3.2 NECK: No new or residual hypermetabolic lymph nodes in the neck. Incidental CT findings: Right internal jugular Port-A-Cath terminates at the cavoatrial junction. CHEST: No new or residual enlarged or hypermetabolic axillary, mediastinal or hilar lymph nodes. Representative 0.7 cm left pericardiophrenic node with max SUV 1.0 (series 4/image 91), previously 1.4 cm with max SUV 101.0 no hypermetabolic pulmonary findings. Incidental CT findings: Small left pleural effusion is decreased. No acute consolidative airspace disease, lung masses or significant pulmonary nodules. Atherosclerotic nonaneurysmal thoracic aorta. ABDOMEN/PELVIS: No new or residual hypermetabolic lymph nodes in the abdomen or pelvis. Previously visualized enlarged retroperitoneal, mesenteric and pelvic lymph nodes have all decreased in size. Representative 1.3 cm central  mesenteric node with max SUV 1.6 (series 4/image 124), previously 3.6 cm with max SUV 16.5. There is new mild homogeneous hypermetabolism in the spleen with max SUV 4.7. Spleen is normal size. No focal splenic hypermetabolism. No abnormal hypermetabolic activity within the liver, pancreas or adrenal glands. Incidental CT findings: Atherosclerotic nonaneurysmal abdominal aorta. SKELETON: There  is new diffuse hypermetabolism throughout axial and proximal appendicular skeleton with representative max SUV 9.1 in the L3 vertebral body. No focal skeletal hypermetabolism. Incidental CT findings: none IMPRESSION: 1. Findings favor complete metabolic response. No residual hypermetabolism within the lymph nodes of the neck, chest, abdomen or pelvis, which have all decreased in size in the interval. 2. Nonspecific new diffuse skeletal hypermetabolism and new mild splenic hypermetabolism, favor reactive state of the marrow and reticuloendothelial system. Spleen is normal size. Continued surveillance with CT or PET-CT advised. 3. Small layering left pleural effusion, decreased. 4.  Aortic Atherosclerosis (ICD10-I70.0). Electronically Signed   By: Ilona Sorrel M.D.   On: 02/14/2019 17:28    ASSESSMENT & PLAN:   1. 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 The intent is for cure. -We discussed some of the risks, benefitsandside-effects of Rituximab,Etoposide, Vincristine, Adriamycin,Cytoxan,Prednisone.The regimen requires inpatient admission due to continuous chemotherapy infusion  -Some of the short term side-effects included, though not  limited to, risk of fatigue, weight loss,tumor lysis syndrome, risk of allergic reactions,pancytopenia, life-threatening infections, need for transfusions of blood products, nausea, vomiting, change in bowel habits,hairloss, risk of congestive heart failure, admission to hospital for various reasons, and risks of death.  -Long term side-effects are also discussed includingpermanent damage to nerve function, chronic fatigue, and rare secondary malignancy including bone marrow disorders. -The patient is aware that the response rates discussed earlier is not guaranteed. -After a long discussion, patient made an informed decision to proceed with the prescribed plan of care. -She has antiemetics available to her  PLAN:  -Discussed pt labwork today, 02/15/19; all values are WNL except for WBC at 24.8K, RBC at 3.16, Hgb at 9.3, HCT at 28.6 RDW at 22.8, nRBC at 0.8, Neutro Abs at 18.1K, Monocytes Abs at 2.7K, Basophils Abs at 0.2K, Abs Immature Granulocytes at 2.60K, Polychromasia is "PRESENT", Ovalocytes are "PRESENT", Sodium at 134, Chloride at 97, Alkaline Phosphatase at 153, Total Bilirubin at <0.2. -Recommend B-complex vitamin or multivitamin daily  -Will begin intrathecal chemotherapy, plan for 4 treaments -The pt has no prohibitive toxicities from continuing C3 of R-EPOCH from 02/21/2019 -Plan for 6 cycles of R-EPOCH -Will see back in 3 weeks with labs  2. 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 Normocytic anemia -Secondary to anemia of chronic disease  -Hgb is 9.3 today -Patient denies any symptoms of bleeding -We will monitor it for now   4. Leukocytosis -related to G-CSF -no symptoms suggestive of infection.  5. Hyponatremia -BC488 today -Continue Pedialyte and  gentle salt addition to the diet, and maintain adequate hydration -We will monitor it closely  6. Hypokalemia -K3.6 today -Has been on potassium chloride 20 mEq daily as an outpatient in the past. Will hold off on ordering for now. Monitor K+ daily.   7. Chemotherapy-associated nausea  -Secondary to chemotherapy -Symptoms relatively well controlled  -Continue PRN-anti-emetics   FOLLOW UP: -Inpatient admission for [facility] on 10/5 for 5 days -Outpatient Rituxan and Neulasta on 10/12 -RTC  with Dr Irene Limbo with labs on 10/19  All of the patients questions were answered with apparent satisfaction. The patient knows to call the clinic with any problems, questions or concerns.  I spent 20 mins  counseling the patient face to face. The total time spent in the appointment was 25 mins and more than 50% was on counseling and direct patient cares.    Sullivan Lone MD [place] AAHIVMS [facility] Hematology/Oncology Physician [facility]  (Office):       [phone] (Work cell):  [phone] (Fax):           [phone]  02/15/2019 8:40 AM  I, Yevette Edwards, am acting as a scribe for Dr. Sullivan Lone.   .I have reviewed the above documentation for accuracy and completeness, and I agree with the above. [name] MD

## 2019-02-15 NOTE — Telephone Encounter (Signed)
Scheduled appt per 9/29 los.  Spoke with patient daughter and she is aware of the appt date and time.

## 2019-02-16 ENCOUNTER — Telehealth: Payer: Self-pay | Admitting: *Deleted

## 2019-02-16 NOTE — Telephone Encounter (Signed)
Contacted [facility] in bed placement with admission date per Dr.Kale: 02/21/2019 for 5 days EPOCH.  Contacted daughter Hart [place] [phone] (patient's contact) to inform her that bed placement will call early Monday 10/5 once bed available. She verbalized understanding.

## 2019-02-21 ENCOUNTER — Other Ambulatory Visit: Payer: Self-pay

## 2019-02-21 ENCOUNTER — Inpatient Hospital Stay (HOSPITAL_COMMUNITY)
Admission: AD | Admit: 2019-02-21 | Discharge: 2019-02-25 | DRG: 847 | Disposition: A | Discharge status: Home / Self Care | Payer: Commercial Managed Care - PPO | Source: Ambulatory Visit | Attending: Hematology | Admitting: Hematology

## 2019-02-21 ENCOUNTER — Encounter (HOSPITAL_COMMUNITY): Payer: Self-pay

## 2019-02-21 DIAGNOSIS — T451X5A Adverse effect of antineoplastic and immunosuppressive drugs, initial encounter: Secondary | ICD-10-CM | POA: Diagnosis present

## 2019-02-21 DIAGNOSIS — C8330 Diffuse large B-cell lymphoma, unspecified site: Secondary | ICD-10-CM | POA: Diagnosis present

## 2019-02-21 DIAGNOSIS — D72828 Other elevated white blood cell count: Secondary | ICD-10-CM | POA: Diagnosis present

## 2019-02-21 DIAGNOSIS — D649 Anemia, unspecified: Secondary | ICD-10-CM | POA: Diagnosis not present

## 2019-02-21 DIAGNOSIS — I10 Essential (primary) hypertension: Secondary | ICD-10-CM | POA: Diagnosis present

## 2019-02-21 DIAGNOSIS — J910 Malignant pleural effusion: Secondary | ICD-10-CM | POA: Diagnosis present

## 2019-02-21 DIAGNOSIS — E7800 Pure hypercholesterolemia, unspecified: Secondary | ICD-10-CM | POA: Diagnosis present

## 2019-02-21 DIAGNOSIS — D638 Anemia in other chronic diseases classified elsewhere: Secondary | ICD-10-CM | POA: Diagnosis present

## 2019-02-21 DIAGNOSIS — E871 Hypo-osmolality and hyponatremia: Secondary | ICD-10-CM | POA: Diagnosis present

## 2019-02-21 DIAGNOSIS — Z20828 Contact with and (suspected) exposure to other viral communicable diseases: Secondary | ICD-10-CM | POA: Diagnosis present

## 2019-02-21 DIAGNOSIS — Z5111 Encounter for antineoplastic chemotherapy: Secondary | ICD-10-CM | POA: Diagnosis not present

## 2019-02-21 DIAGNOSIS — C8339 Diffuse large B-cell lymphoma, extranodal and solid organ sites: Secondary | ICD-10-CM

## 2019-02-21 DIAGNOSIS — E876 Hypokalemia: Secondary | ICD-10-CM | POA: Diagnosis present

## 2019-02-21 DIAGNOSIS — D709 Neutropenia, unspecified: Secondary | ICD-10-CM | POA: Diagnosis present

## 2019-02-21 DIAGNOSIS — E785 Hyperlipidemia, unspecified: Secondary | ICD-10-CM | POA: Diagnosis present

## 2019-02-21 DIAGNOSIS — D6481 Anemia due to antineoplastic chemotherapy: Secondary | ICD-10-CM | POA: Diagnosis present

## 2019-02-21 DIAGNOSIS — G629 Polyneuropathy, unspecified: Secondary | ICD-10-CM | POA: Diagnosis present

## 2019-02-21 LAB — COMPREHENSIVE METABOLIC PANEL
ALT: 37 U/L (ref 0–44)
AST: 27 U/L (ref 15–41)
Albumin: 3.4 g/dL — ABNORMAL LOW (ref 3.5–5.0)
Alkaline Phosphatase: 94 U/L (ref 38–126)
Anion gap: 9 (ref 5–15)
BUN: 11 mg/dL (ref 6–20)
CO2: 23 mmol/L (ref 22–32)
Calcium: 8.3 mg/dL — ABNORMAL LOW (ref 8.9–10.3)
Chloride: 97 mmol/L — ABNORMAL LOW (ref 98–111)
Creatinine, Ser: 0.62 mg/dL (ref 0.44–1.00)
GFR calc Af Amer: >60 mL/min (ref >60)
GFR calc non Af Amer: >60 mL/min (ref >60)
Glucose, Bld: 96 mg/dL (ref 70–99)
Potassium: 3.9 mmol/L (ref 3.5–5.1)
Sodium: 129 mmol/L — ABNORMAL LOW (ref 135–145)
Total Bilirubin: 0.4 mg/dL (ref 0.3–1.2)
Total Protein: 6.1 g/dL — ABNORMAL LOW (ref 6.5–8.1)

## 2019-02-21 LAB — CBC WITH DIFFERENTIAL/PLATELET
Abs Immature Granulocytes: 0.12 10*3/uL — ABNORMAL HIGH (ref 0.00–0.07)
Basophils Absolute: 0.1 10*3/uL (ref 0.0–0.1)
Basophils Relative: 1 %
Eosinophils Absolute: 0 10*3/uL (ref 0.0–0.5)
Eosinophils Relative: 0 %
HCT: 27.6 % — ABNORMAL LOW (ref 36.0–46.0)
Hemoglobin: 8.6 g/dL — ABNORMAL LOW (ref 12.0–15.0)
Immature Granulocytes: 1 %
Lymphocytes Relative: 6 %
Lymphs Abs: 0.5 10*3/uL — ABNORMAL LOW (ref 0.7–4.0)
MCH: 28.8 pg (ref 26.0–34.0)
MCHC: 31.2 g/dL (ref 30.0–36.0)
MCV: 92.3 fL (ref 80.0–100.0)
Monocytes Absolute: 0.8 10*3/uL (ref 0.1–1.0)
Monocytes Relative: 10 %
Neutro Abs: 6.9 10*3/uL (ref 1.7–7.7)
Neutrophils Relative %: 82 %
Platelets: 337 10*3/uL (ref 150–400)
RBC: 2.99 MIL/uL — ABNORMAL LOW (ref 3.87–5.11)
RDW: 21.3 % — ABNORMAL HIGH (ref 11.5–15.5)
WBC: 8.4 10*3/uL (ref 4.0–10.5)
nRBC: 0 % (ref 0.0–0.2)

## 2019-02-21 LAB — SARS CORONAVIRUS 2 (TAT 6-24 HRS): SARS Coronavirus 2: NEGATIVE

## 2019-02-21 LAB — MAGNESIUM: Magnesium: 2 mg/dL (ref 1.7–2.4)

## 2019-02-21 LAB — URIC ACID: Uric Acid, Serum: 3.5 mg/dL (ref 2.5–7.1)

## 2019-02-21 LAB — PHOSPHORUS: Phosphorus: 5.2 mg/dL — ABNORMAL HIGH (ref 2.5–4.6)

## 2019-02-21 MED ORDER — ADULT MULTIVITAMIN W/MINERALS CH
1.0000 | ORAL_TABLET | Freq: Every day | ORAL | Status: DC
  Administered 2019-02-21 – 2019-02-25 (×5): 1 via ORAL
  Filled 2019-02-21 (×5): qty 1

## 2019-02-21 MED ORDER — ENOXAPARIN SODIUM 40 MG/0.4ML ~~LOC~~ SOLN
40.0000 mg | SUBCUTANEOUS | Status: DC
  Administered 2019-02-21 – 2019-02-25 (×4): 40 mg via SUBCUTANEOUS
  Filled 2019-02-21 (×4): qty 0.4

## 2019-02-21 MED ORDER — MELATONIN 3 MG PO TABS
3.0000 mg | ORAL_TABLET | Freq: Every evening | ORAL | Status: DC | PRN
  Filled 2019-02-21: qty 1

## 2019-02-21 MED ORDER — PRENATAL 27-0.8 MG PO TABS
1.0000 | ORAL_TABLET | Freq: Every day | ORAL | Status: DC
  Filled 2019-02-21 (×2): qty 1

## 2019-02-21 MED ORDER — POTASSIUM CHLORIDE CRYS ER 20 MEQ PO TBCR
20.0000 meq | EXTENDED_RELEASE_TABLET | Freq: Every day | ORAL | Status: DC
  Administered 2019-02-21 – 2019-02-25 (×5): 20 meq via ORAL
  Filled 2019-02-21 (×5): qty 1

## 2019-02-21 MED ORDER — LIDOCAINE-PRILOCAINE 2.5-2.5 % EX CREA: 1.0000 "application " | TOPICAL_CREAM | CUTANEOUS | Status: DC | PRN

## 2019-02-21 MED ORDER — TRAZODONE HCL 50 MG PO TABS: 25.0000 mg | ORAL_TABLET | Freq: Every evening | ORAL | Status: DC | PRN

## 2019-02-21 MED ORDER — POLYETHYLENE GLYCOL 3350 17 G PO PACK: 17.0000 g | PACK | Freq: Every day | ORAL | Status: DC | PRN

## 2019-02-21 MED ORDER — SODIUM CHLORIDE 0.9 % IV SOLN
Freq: Once | INTRAVENOUS | Status: AC
  Administered 2019-02-21: 16:00:00 18 mg via INTRAVENOUS
  Filled 2019-02-21: qty 4

## 2019-02-21 MED ORDER — CHLORHEXIDINE GLUCONATE CLOTH 2 % EX PADS
6.0000 | MEDICATED_PAD | Freq: Every day | CUTANEOUS | Status: DC
  Administered 2019-02-21 – 2019-02-25 (×4): 6 via TOPICAL

## 2019-02-21 MED ORDER — VITAMIN D 25 MCG (1000 UNIT) PO TABS
1000.0000 [IU] | ORAL_TABLET | Freq: Every day | ORAL | Status: DC
  Administered 2019-02-21 – 2019-02-25 (×5): 1000 [IU] via ORAL
  Filled 2019-02-21 (×5): qty 1

## 2019-02-21 MED ORDER — SODIUM CHLORIDE 0.9% FLUSH
10.0000 mL | Freq: Two times a day (BID) | INTRAVENOUS | Status: DC
  Administered 2019-02-21: 10 mL

## 2019-02-21 MED ORDER — SODIUM CHLORIDE 0.9 % IV SOLN
INTRAVENOUS | Status: DC
  Administered 2019-02-21: 18:00:00 via INTRAVENOUS

## 2019-02-21 MED ORDER — CALCIUM CARBONATE ANTACID 500 MG PO CHEW: 2.0000 | CHEWABLE_TABLET | Freq: Every day | ORAL | Status: DC | PRN

## 2019-02-21 MED ORDER — VINCRISTINE SULFATE CHEMO INJECTION 1 MG/ML
Freq: Once | INTRAVENOUS | Status: AC
  Administered 2019-02-21: 16:00:00 via INTRAVENOUS
  Filled 2019-02-21: qty 8

## 2019-02-21 MED ORDER — MESALAMINE 1.2 G PO TBEC
2.4000 g | DELAYED_RELEASE_TABLET | Freq: Every day | ORAL | Status: DC
  Administered 2019-02-21 – 2019-02-25 (×5): 2.4 g via ORAL
  Filled 2019-02-21 (×5): qty 2

## 2019-02-21 MED ORDER — ONDANSETRON HCL 8 MG PO TABS: 8.0000 mg | ORAL_TABLET | Freq: Two times a day (BID) | ORAL | Status: DC | PRN

## 2019-02-21 MED ORDER — SODIUM CHLORIDE 0.9% FLUSH: 10.0000 mL | INTRAVENOUS | Status: DC | PRN

## 2019-02-21 MED ORDER — MECLIZINE HCL 25 MG PO TABS: 25.0000 mg | ORAL_TABLET | Freq: Three times a day (TID) | ORAL | Status: DC | PRN

## 2019-02-21 NOTE — Progress Notes (Signed)
MD plans to keep doses the same as previous cycles and not dose escalate at this point.

## 2019-02-21 NOTE — Progress Notes (Signed)
Rate adjusted for overfill in bag and a true 22 hour infusion.  Hardie Pulley, PharmD, BCPS, BCOP

## 2019-02-21 NOTE — H&P (Addendum)
[place]  [phone] [phone]   MEDICAL ONCOLOGY - HISTORY & PHYSICAL  CHIEF COMPLAINT: Admission for cycle 3 R-EPOCH for diffuse large B-cell lymphoma  HPI: Ms. Denise Walls is a 58 year old female with a past medical history significant for anxiety, Crohn's disease, hypertension, hyperlipidemia, migraine headaches. The patient reports that she has history of Crohn's disease, for which she has been taking mesalamine with very good control of her symptoms. She was previously followed by Dr. Kathy Breach gastroenterology and to transition her care to a different gastroenterologist after Dr.Guptamoved to [facility]. Her new gastroenterologist stopped her mesalamine some time in 2019,and soon after that, she began to develop new onset left lower quadrant discomfort/pain,nonradiating, dull, intermittent, mild to moderate in intensity, exacerbated by eating and improved with defecation. She underwent colonoscopy, which was unremarkable, and was recommended to modify her diet without any improvement. She then re-established her care with Dr. Etter Sjogren ordered CT abdomen/pelvis that showed diffuse abdominal lymphadenopathy. PET showed diffuse lymph node involvement in the neck, chest, abdomen and the pelvis,and patient was referred to oncology for further evaluation.  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 The patient initially received 1 cycle of R-CHOP which she tolerated fairly well with exception of hyponatremia and neutropenia.  Prior to her second planned cycle of chemotherapy, she was found to be positive for Myc and BCL-2 rearrangement.  She is now receiving  R-EPOCH and is here for cycle #3 of this chemotherapy.   Today, the patient reports that she is feeling well overall.  Reports some mild neuropathy in her fingertips but not on her feet.  Appetite has been good.   Denies mucositis.  Denies headaches and dizziness.  Reports stable breathing and no cough.  Denies chest pain.  Denies abdominal pain, nausea, vomiting, constipation, diarrhea.  Denies bleeding.  The patient is here for admission for cycle #3 of R-EPOCH.  COVID-19 testing pending.  The patient is asymptomatic.    Past Medical History:  Diagnosis Date  . Acute cystitis   . Anxiety   . Arthritis   . Crohn disease ([place])    dx 2004, history of small bowel obstruction 02/2007 treated conservatively  . History of colon polyps   . History of shingles   . HTN (hypertension)   . Hypercholesterolemia   . Insomnia   . Migraine   . Uterine fibroid   :  Past Surgical History:  Procedure Laterality Date  . COLONOSCOPY  09/11/2014   Small internal hemorrhoids. Otherwise normal colonoscopy to terminal ileum  . COLONOSCOPY  10/02/2017  . ESOPHAGOGASTRODUODENOSCOPY  04/05/2007   Mild gastritis. Otherwise, normal esophagogastroduodenoscopy  . IR IMAGING GUIDED PORT INSERTION  11/29/2018  . LYMPH NODE BIOPSY Left 12/09/2018   Procedure: LEFT DEEP CERVICAL LYMPH NODE BIOPSY;  Surgeon: Fanny Skates, MD;  Location: [place];  Service: General;  Laterality: Left;  . TUBAL LIGATION    :  Current Facility-Administered Medications  Medication Dose Route Frequency Provider Last Rate Last Dose  . calcium carbonate (TUMS - dosed in mg elemental calcium) chewable tablet 400 mg of elemental calcium  2 tablet Oral Daily PRN Maryanna Shape, NP      . Chlorhexidine Gluconate Cloth 2 % PADS 6 each  6 each Topical Daily Curcio, Kristin R, NP      . cholecalciferol (VITAMIN D3) tablet 1,000 Units  1,000 Units Oral Daily Curcio, Roselie Awkward, NP      .  enoxaparin (LOVENOX) injection 40 mg  40 mg Subcutaneous Q24H Curcio, Kristin R, NP      . lidocaine-prilocaine (EMLA) cream 1 application  1 application Topical PRN Curcio, Roselie Awkward, NP      . meclizine (ANTIVERT) tablet 25 mg  25 mg Oral TID PRN Maryanna Shape,  NP      . Melatonin TABS 3 mg  3 mg Oral QHS PRN Curcio, Roselie Awkward, NP      . mesalamine (LIALDA) EC tablet 2.4 g  2.4 g Oral Daily Curcio, Roselie Awkward, NP      . multivitamin-prenatal tablet 1 tablet  1 tablet Oral Q1200 Curcio, Kristin R, NP      . ondansetron (ZOFRAN) tablet 8 mg  8 mg Oral BID PRN Curcio, Roselie Awkward, NP      . polyethylene glycol (MIRALAX / GLYCOLAX) packet 17 g  17 g Oral Daily PRN Curcio, Roselie Awkward, NP      . potassium chloride SA (KLOR-CON) CR tablet 20 mEq  20 mEq Oral Daily Curcio, Kristin R, NP      . sodium chloride flush (NS) 0.9 % injection 10-40 mL  10-40 mL Intracatheter Q12H Curcio, Kristin R, NP      . sodium chloride flush (NS) 0.9 % injection 10-40 mL  10-40 mL Intracatheter PRN Maryanna Shape, NP      . traZODone (DESYREL) tablet 25 mg  25 mg Oral QHS PRN Curcio, Roselie Awkward, NP         Allergies  Allergen Reactions  . Nsaids Anaphylaxis and Other (See Comments)    Abdominal pain, GI Bleeding   :  Family History  Problem Relation Age of Onset  . Colon cancer Neg Hx   :  Social History   Socioeconomic History  . Marital status: Married    Spouse name: Not on file  . Number of children: Not on file  . Years of education: Not on file  . Highest education level: Not on file  Occupational History  . Not on file  Social Needs  . Financial resource strain: Not on file  . Food insecurity    Worry: Not on file    Inability: Not on file  . Transportation needs    Medical: Not on file    Non-medical: Not on file  Tobacco Use  . Smoking status: Never Smoker  . Smokeless tobacco: Never Used  Substance and Sexual Activity  . Alcohol use: Never    Frequency: Never  . Drug use: Never  . Sexual activity: Yes    Birth control/protection: Post-menopausal  Lifestyle  . Physical activity    Days per week: Not on file    Minutes per session: Not on file  . Stress: Not on file  Relationships  . Social Herbalist on phone: Not on file     Gets together: Not on file    Attends religious service: Not on file    Active member of club or organization: Not on file    Attends meetings of clubs or organizations: Not on file    Relationship status: Not on file  . Intimate partner violence    Fear of current or ex partner: Not on file    Emotionally abused: Not on file    Physically abused: Not on file    Forced sexual activity: Not on file  Other Topics Concern  . Not on file  Social History Narrative  . Not on  file  :  Review of Systems: A comprehensive 14 point review of systems was negative except as noted in the HPI.  Exam: Patient Vitals for the past 24 hrs:  BP Temp Temp src Pulse Resp SpO2 Height Weight  02/21/19 0944 103/62 97.9 F (36.6 C) Oral 92 18 97 % 4' 11"  (1.499 m) 132 lb 3.2 oz (60 kg)    General:  well-nourished in no acute distress.   Eyes:  no scleral icterus.   ENT:  There were no oropharyngeal lesions.   Neck was without thyromegaly.   Lymphatics:  Negative cervical, supraclavicular or axillary adenopathy.   Respiratory: lungs were clear bilaterally without wheezing or crackles.   Cardiovascular:  Regular rate and rhythm, S1/S2, without murmur, rub or gallop.  There was no pedal edema.   GI:  abdomen was soft, flat, nontender, nondistended, without organomegaly.   Musculoskeletal:  no spinal tenderness of palpation of vertebral spine.   Skin exam was without echymosis, petichae.   Neuro exam was nonfocal. Patient was alert and oriented.  Attention was good.   Language was appropriate.  Mood was normal without depression.  Speech was not pressured.  Thought content was not tangential.     Lab Results  Component Value Date   WBC 24.8 (H) 02/15/2019   HGB 9.3 (L) 02/15/2019   HCT 28.6 (L) 02/15/2019   PLT 195 02/15/2019   GLUCOSE 99 02/15/2019   ALT 28 02/15/2019   AST 20 02/15/2019   NA 134 (L) 02/15/2019   K 4.1 02/15/2019   CL 97 (L) 02/15/2019   CREATININE 0.81 02/15/2019   BUN 9  02/15/2019   CO2 27 02/15/2019    Mr Brain W Wo Contrast  Result Date: 01/27/2019 CLINICAL DATA:  Vertigo episodic. History of lymphoma. Malignant pleural effusion. Crohn's disease. EXAM: MRI HEAD WITHOUT AND WITH CONTRAST TECHNIQUE: Multiplanar, multiecho pulse sequences of the brain and surrounding structures were obtained without and with intravenous contrast. CONTRAST:  49m GADAVIST GADOBUTROL 1 MMOL/ML IV SOLN COMPARISON:  None. FINDINGS: Brain: On the initial scan, the tech inadvertently did not perform coronal postcontrast imaging. The patient returned for postcontrast sagittal coronal and axial imaging as there was question of enhancing lesion in the left anterior putamen on the initial study. This area is not seen on repeat imaging and is felt to be artifact on the initial study. No enhancing mass lesion identified. Ventricle size normal. Cerebral volume normal. Negative for acute infarct. Mild periventricular white matter hyperintensity bilaterally appears chronic. No hemorrhage mass or edema. Vascular: Normal arterial flow voids. Skull and upper cervical spine: Negative Sinuses/Orbits: Mucosal edema paranasal sinuses.  Normal orbit Other: None IMPRESSION: No acute abnormality.  No acute infarct or mass Repeat imaging fails to confirm a small enhancing lesion left anterior putamen which is felt to be an artifact on the initial axial sequence postcontrast. Electronically Signed   By: CFranchot GalloM.D.   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 EXAM: NUCLEAR MEDICINE PET SKULL BASE TO THIGH TECHNIQUE: 6.6 mCi F-18 FDG was injected intravenously. Full-ring PET imaging was performed from the skull base to thigh after the radiotracer. CT data was obtained and used for attenuation correction and anatomic localization.  Fasting blood glucose: 98 mg/dl COMPARISON:  11/10/2018 PET-CT. FINDINGS: Mediastinal blood pool activity: SUV max 1.9 Liver  activity: SUV max 3.2 NECK: No new or residual hypermetabolic lymph nodes in the neck. Incidental CT findings: Right internal jugular Port-A-Cath terminates at the cavoatrial junction. CHEST: No new or residual enlarged or hypermetabolic axillary, mediastinal or hilar lymph nodes. Representative 0.7 cm left pericardiophrenic node with max SUV 1.0 (series 4/image 91), previously 1.4 cm with max SUV 08.6. no hypermetabolic pulmonary findings. Incidental CT findings: Small left pleural effusion is decreased. No acute consolidative airspace disease, lung masses or significant pulmonary nodules. Atherosclerotic nonaneurysmal thoracic aorta. ABDOMEN/PELVIS: No new or residual hypermetabolic lymph nodes in the abdomen or pelvis. Previously visualized enlarged retroperitoneal, mesenteric and pelvic lymph nodes have all decreased in size. Representative 1.3 cm central mesenteric node with max SUV 1.6 (series 4/image 124), previously 3.6 cm with max SUV 16.5. There is new mild homogeneous hypermetabolism in the spleen with max SUV 4.7. Spleen is normal size. No focal splenic hypermetabolism. No abnormal hypermetabolic activity within the liver, pancreas or adrenal glands. Incidental CT findings: Atherosclerotic nonaneurysmal abdominal aorta. SKELETON: There is new diffuse hypermetabolism throughout axial and proximal appendicular skeleton with representative max SUV 9.1 in the L3 vertebral body. No focal skeletal hypermetabolism. Incidental CT findings: none IMPRESSION: 1. Findings favor complete metabolic response. No residual hypermetabolism within the lymph nodes of the neck, chest, abdomen or pelvis, which have all decreased in size in the interval. 2. Nonspecific new diffuse skeletal hypermetabolism and new mild splenic hypermetabolism, favor reactive state of the marrow and reticuloendothelial  system. Spleen is normal size. Continued surveillance with CT or PET-CT advised. 3. Small layering left pleural effusion, decreased. 4.  Aortic Atherosclerosis (ICD10-I70.0). Electronically Signed   By: Ilona Sorrel M.D.   On: 02/14/2019 17:28     Mr Jeri Cos VH Contrast  Result Date: 01/27/2019 CLINICAL DATA:  Vertigo episodic. History of lymphoma. Malignant pleural effusion. Crohn's disease. EXAM: MRI HEAD WITHOUT AND WITH CONTRAST TECHNIQUE: Multiplanar, multiecho pulse sequences of the brain and surrounding structures were obtained without and with intravenous contrast. CONTRAST:  46m GADAVIST GADOBUTROL 1 MMOL/ML IV SOLN COMPARISON:  None. FINDINGS: Brain: On the initial scan, the tech inadvertently did not perform coronal postcontrast imaging. The patient returned for postcontrast sagittal coronal and axial imaging as there was question of enhancing lesion in the left anterior putamen on the initial study. This area is not seen on repeat imaging and is felt to be artifact on the initial study. No enhancing mass lesion identified. Ventricle size normal. Cerebral volume normal. Negative for acute infarct. Mild periventricular white matter hyperintensity bilaterally appears chronic. No hemorrhage mass or edema. Vascular: Normal arterial flow voids. Skull and upper cervical spine: Negative Sinuses/Orbits: Mucosal edema paranasal sinuses.  Normal orbit Other: None IMPRESSION: No acute abnormality.  No acute infarct or mass Repeat imaging fails to confirm a small enhancing lesion left anterior putamen which is felt to be an artifact on the initial axial sequence postcontrast. Electronically Signed   By: CFranchot GalloM.D.   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 EXAM: NUCLEAR MEDICINE PET SKULL BASE TO THIGH TECHNIQUE: 6.6  mCi F-18 FDG was injected intravenously. Full-ring PET imaging was performed from the skull base to thigh after the radiotracer. CT data was obtained and used for attenuation correction and anatomic localization. Fasting blood glucose: 98 mg/dl COMPARISON:  11/10/2018 PET-CT. FINDINGS: Mediastinal  blood pool activity: SUV max 1.9 Liver activity: SUV max 3.2 NECK: No new or residual hypermetabolic lymph nodes in the neck. Incidental CT findings: Right internal jugular Port-A-Cath terminates at the cavoatrial junction. CHEST: No new or residual enlarged or hypermetabolic axillary, mediastinal or hilar lymph nodes. Representative 0.7 cm left pericardiophrenic node with max SUV 1.0 (series 4/image 91), previously 1.4 cm with max SUV 44.9. no hypermetabolic pulmonary findings. Incidental CT findings: Small left pleural effusion is decreased. No acute consolidative airspace disease, lung masses or significant pulmonary nodules. Atherosclerotic nonaneurysmal thoracic aorta. ABDOMEN/PELVIS: No new or residual hypermetabolic lymph nodes in the abdomen or pelvis. Previously visualized enlarged retroperitoneal, mesenteric and pelvic lymph nodes have all decreased in size. Representative 1.3 cm central mesenteric node with max SUV 1.6 (series 4/image 124), previously 3.6 cm with max SUV 16.5. There is new mild homogeneous hypermetabolism in the spleen with max SUV 4.7. Spleen is normal size. No focal splenic hypermetabolism. No abnormal hypermetabolic activity within the liver, pancreas or adrenal glands. Incidental CT findings: Atherosclerotic nonaneurysmal abdominal aorta. SKELETON: There is new diffuse hypermetabolism throughout axial and proximal appendicular skeleton with representative max SUV 9.1 in the L3 vertebral body. No focal skeletal hypermetabolism. Incidental CT findings: none IMPRESSION: 1. Findings favor complete metabolic response. No residual hypermetabolism within the lymph nodes of the neck, chest, abdomen  or pelvis, which have all decreased in size in the interval. 2. Nonspecific new diffuse skeletal hypermetabolism and new mild splenic hypermetabolism, favor reactive state of the marrow and reticuloendothelial system. Spleen is normal size. Continued surveillance with CT or PET-CT advised. 3. Small layering left pleural effusion, decreased. 4.  Aortic Atherosclerosis (ICD10-I70.0). Electronically Signed   By: Ilona Sorrel M.D.   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 The intent is for cure. -We discussed some of the risks, benefitsandside-effects of Rituximab,Etoposide, Vincristine, Adriamycin,Cytoxan,Prednisone.The regimen requires inpatient admission due to continuous chemotherapy infusion  -Some of the short term side-effects included, though not limited to, risk of fatigue, weight loss,tumor lysis syndrome, risk of allergic reactions,pancytopenia, life-threatening infections, need for transfusions of blood products, nausea, vomiting, change in bowel habits,hairloss, risk of congestive heart failure, admission to hospital for various reasons, and risks of death.  -Long term side-effects are also discussed includingpermanent damage to nerve function, chronic fatigue, and rare secondary malignancy including bone marrow disorders. -The patient is aware that the response rates discussed earlier is not guaranteed. -After a long discussion, patient made an informed decision to proceed with the prescribed plan of care. -She will proceed with cycle #3 of EPOCH today as  scheduled.  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 -WBC is pending today -We will monitor it for now  Hyponatremia -Napending today -Continue Pedialyte and gentle salt addition to the diet, and maintain adequate hydration -We will monitor it closely  Hypokalemia -K+ pending today -Will continue potassium chloride 20 mEq daily for now pending lab results.   Chemotherapy-associated nausea  -Secondary to chemotherapy -Symptoms relatively well controlled  -Continue PRN-anti-emetics  We will plan for Rituxan and Neulasta on 02/28/2019.  Follow-up visit with Dr. Irene Limbo with lab work on 03/07/2019.  Mikey Bussing, DNP, AGPCNP-BC, AOCNP   ADDENDUM  .Patient was Personally and independently interviewed, examined and relevant elements of the history of present illness were reviewed in details and an assessment and plan was created. All elements of the patient's history of present illness , assessment and plan were discussed in details with Mikey Bussing, DNP. The above documentation reflects our combined findings assessment and plan.  Sullivan Lone MD MS

## 2019-02-22 ENCOUNTER — Other Ambulatory Visit: Payer: Commercial Managed Care - PPO

## 2019-02-22 ENCOUNTER — Ambulatory Visit: Payer: Commercial Managed Care - PPO | Admitting: Hematology

## 2019-02-22 ENCOUNTER — Ambulatory Visit: Payer: Commercial Managed Care - PPO

## 2019-02-22 DIAGNOSIS — C8330 Diffuse large B-cell lymphoma, unspecified site: Secondary | ICD-10-CM

## 2019-02-22 LAB — COMPREHENSIVE METABOLIC PANEL
ALT: 38 U/L (ref 0–44)
AST: 22 U/L (ref 15–41)
Albumin: 3.3 g/dL — ABNORMAL LOW (ref 3.5–5.0)
Alkaline Phosphatase: 88 U/L (ref 38–126)
Anion gap: 8 (ref 5–15)
BUN: 14 mg/dL (ref 6–20)
CO2: 24 mmol/L (ref 22–32)
Calcium: 8.8 mg/dL — ABNORMAL LOW (ref 8.9–10.3)
Chloride: 103 mmol/L (ref 98–111)
Creatinine, Ser: 0.5 mg/dL (ref 0.44–1.00)
GFR calc Af Amer: >60 mL/min (ref >60)
GFR calc non Af Amer: >60 mL/min (ref >60)
Glucose, Bld: 149 mg/dL — ABNORMAL HIGH (ref 70–99)
Potassium: 3.8 mmol/L (ref 3.5–5.1)
Sodium: 135 mmol/L (ref 135–145)
Total Bilirubin: 0.5 mg/dL (ref 0.3–1.2)
Total Protein: 5.9 g/dL — ABNORMAL LOW (ref 6.5–8.1)

## 2019-02-22 LAB — CBC WITH DIFFERENTIAL/PLATELET
Abs Immature Granulocytes: 0.06 10*3/uL (ref 0.00–0.07)
Basophils Absolute: 0 10*3/uL (ref 0.0–0.1)
Basophils Relative: 0 %
Eosinophils Absolute: 0 10*3/uL (ref 0.0–0.5)
Eosinophils Relative: 0 %
HCT: 28.7 % — ABNORMAL LOW (ref 36.0–46.0)
Hemoglobin: 9.1 g/dL — ABNORMAL LOW (ref 12.0–15.0)
Immature Granulocytes: 1 %
Lymphocytes Relative: 4 %
Lymphs Abs: 0.3 10*3/uL — ABNORMAL LOW (ref 0.7–4.0)
MCH: 29.2 pg (ref 26.0–34.0)
MCHC: 31.7 g/dL (ref 30.0–36.0)
MCV: 92 fL (ref 80.0–100.0)
Monocytes Absolute: 0.1 10*3/uL (ref 0.1–1.0)
Monocytes Relative: 2 %
Neutro Abs: 6.5 10*3/uL (ref 1.7–7.7)
Neutrophils Relative %: 93 %
Platelets: 359 10*3/uL (ref 150–400)
RBC: 3.12 MIL/uL — ABNORMAL LOW (ref 3.87–5.11)
RDW: 20.9 % — ABNORMAL HIGH (ref 11.5–15.5)
WBC: 7 10*3/uL (ref 4.0–10.5)
nRBC: 0 % (ref 0.0–0.2)

## 2019-02-22 MED ORDER — VINCRISTINE SULFATE CHEMO INJECTION 1 MG/ML
Freq: Once | INTRAVENOUS | Status: AC
  Administered 2019-02-22: 14:00:00 via INTRAVENOUS
  Filled 2019-02-22: qty 8

## 2019-02-22 MED ORDER — SODIUM CHLORIDE 0.9 % IV SOLN
Freq: Once | INTRAVENOUS | Status: AC
  Administered 2019-02-22: 13:00:00 8 mg via INTRAVENOUS
  Filled 2019-02-22 (×2): qty 4

## 2019-02-22 NOTE — Progress Notes (Addendum)
HEMATOLOGY-ONCOLOGY PROGRESS NOTE  SUBJECTIVE: The patient reports feeling well this morning.  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  Denies mucositis.  Denies chest pain or shortness of breath.  No cough.  Denies abdominal pain, nausea, vomiting.  Oncology History  DLBCL (diffuse large B cell lymphoma) ([place])  10/29/2018 Imaging   CT abdomen/pelvis: IMPRESSION: Abdominopelvic lymphadenopathy, including a dominant 3.2 cm short axis jejunal mesentery nodal mass encasing the SMA, partially necrotic. Overall appearance favors lymphoma, although nodal metastases are also possible.   Spleen is normal in size. However, hypoenhancing lesions are suspected on the portal venous phase, raising the possibility of lymphomatous involvement.   Suspected subcarinal nodal mass, incompletely evaluated. Consider CT chest or PET-CT for further evaluation.   Moderate left pleural effusion. Associated left lower lobe opacity, likely atelectasis.   11/10/2018 Imaging   PET: IMPRESSION: 1. 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 2. Nodal metastasis include the lower neck, mediastinum, mesentery, peritoneum and retroperitoneum, and iliac lymph nodes. 3. Spleen and bone marrow normal. 4. Moderate LEFT pleural effusion. 5. Target lymph nodes for sampling could include the LEFT external iliac lymph node, precordial lymph node in the LEFT upper quadrant, or super clavicular/LEFT sub pectoralis nodes.   12/03/2018 Bone Marrow Biopsy   Bone Marrow, Aspirate,Biopsy, and Clot, left posterior iliac crest - NORMOCELLULAR MARROW WITH FOCAL SMALL LYMPHOID AGGREGATES. - SEE COMMENT. PERIPHERAL BLOOD: - BORDERLINE NORMOCYTIC ANEMIA. Diagnosis Note The marrow has only a few small lymphoid aggregates which are not overtly atypical and contain a mixture of B-cells and T-cells. Flow cytometry is negative for a monoclonal B-cell population.  These findings are not diagnostic of a lymphoproliferative process.  Accession: [id number] Bone Marrow Flow Cytometry - NO MONOCLONAL B-CELL OR PHENOTYPICALLY ABERRANT T-CELL POPULATION IDENTIFIED.   12/03/2018 Imaging   TTE:  1. The left ventricle has normal systolic function with an ejection fraction of 60-65%. The cavity size was normal. Left ventricular diastolic Doppler parameters are consistent with impaired relaxation.  2. The right ventricle has normal systolic function. The cavity was normal. There is no increase in right ventricular wall thickness.  3. Large pleural effusion.  4. Clinical correlation suggested.  5. The mitral valve is grossly normal.  6. The tricuspid valve is grossly normal.  7. The aortic valve is grossly normal. Aortic valve regurgitation was not assessed by color flow Doppler.  8. The aortic root is normal in size and structure.   12/08/2018 Procedure   Left thoracentesis   12/08/2018 Pathology Results   Diagnosis PLEURAL FLUID, LEFT (SPECIMEN 1 OF 1 COLLECTED 12/08/18): - B-CELL LYMPHOMA - SEE COMMENT   12/09/2018 Procedure   Left supraclavicular LN bx    12/09/2018 Pathology Results   Accession: [id number] Lymph node for lymphoma, left deep cervical - DIFFUSE LARGE B-CELL LYMPHOMA - SEE COMMENT   12/21/2018 - 01/10/2019 Chemotherapy   The patient had DOXOrubicin (ADRIAMYCIN) chemo injection 80 mg, 50 mg/m2 = 80 mg, Intravenous,  Once, 1 of 6 cycles Administration: 80 mg (12/21/2018) palonosetron (ALOXI) injection 0.25 mg, 0.25 mg, Intravenous,  Once, 1 of 6 cycles Administration: 0.25 mg (12/21/2018) pegfilgrastim-cbqv (UDENYCA) injection 6 mg, 6 mg, Subcutaneous, Once, 1 of 6 cycles Administration: 6 mg (12/23/2018) vinCRIStine (ONCOVIN) 2 mg in sodium chloride 0.9 % 50 mL chemo infusion, 2 mg, Intravenous,  Once, 1 of 6 cycles Administration: 2 mg (12/21/2018) riTUXimab (RITUXAN) 600 mg in sodium chloride 0.9 % 250 mL (1.9355 mg/mL) infusion, 375 mg/m2 =  600  mg, Intravenous,  Once, 1 of 1 cycle Administration: 600 mg (12/21/2018) cyclophosphamide (CYTOXAN) 1,200 mg in sodium chloride 0.9 % 250 mL chemo infusion, 750 mg/m2 = 1,200 mg, Intravenous,  Once, 1 of 6 cycles Administration: 1,200 mg (12/21/2018) riTUXimab (RITUXAN) 600 mg in sodium chloride 0.9 % 190 mL infusion, 375 mg/m2 = 600 mg (100 % of original dose 375 mg/m2), Intravenous,  Once, 0 of 5 cycles Dose modification: 375 mg/m2 (original dose 375 mg/m2, Cycle 2, Reason: Provider Judgment, Comment: VO to change to RIR) fosaprepitant (EMEND) 150 mg, dexamethasone (DECADRON) 12 mg in sodium chloride 0.9 % 145 mL IVPB, , Intravenous,  Once, 1 of 6 cycles Administration:  (12/21/2018)  for chemotherapy treatment.    01/10/2019 -  Chemotherapy   The patient had DOXOrubicin (ADRIAMYCIN) 16 mg, etoposide (VEPESID) 80 mg, vinCRIStine (ONCOVIN) 0.6 mg in sodium chloride 0.9 % 1,000 mL chemo infusion, , Intravenous, Once, 3 of 5 cycles Administration:  (01/10/2019),  (01/11/2019),  (01/31/2019),  (02/01/2019),  (01/12/2019),  (01/13/2019),  (02/02/2019),  (02/03/2019),  (02/21/2019) ondansetron (ZOFRAN) 8 mg, dexamethasone (DECADRON) 10 mg in sodium chloride 0.9 % 50 mL IVPB, , Intravenous,  Once, 3 of 5 cycles Administration: 18 mg (01/10/2019), 8 mg (01/11/2019), 36 mg (01/14/2019), 8 mg (01/31/2019), 18 mg (02/01/2019), 36 mg (02/04/2019), 18 mg (01/12/2019), 18 mg (01/13/2019), 18 mg (02/02/2019), 18 mg (02/03/2019), 18 mg (02/21/2019) cyclophosphamide (CYTOXAN) 1,180 mg in sodium chloride 0.9 % 250 mL chemo infusion, 750 mg/m2 = 1,180 mg, Intravenous,  Once, 3 of 5 cycles Administration: 1,180 mg (01/14/2019), 1,180 mg (02/04/2019)  for chemotherapy treatment.    01/17/2019 -  Chemotherapy   The patient had pegfilgrastim-cbqv [facility]) injection 6 mg, 6 mg, Subcutaneous, Once, 2 of 6 cycles Administration: 6 mg (01/17/2019), 6 mg (02/07/2019) riTUXimab (RITUXAN) 600 mg in sodium chloride 0.9 % 250 mL (1.9355 mg/mL)  infusion, 375 mg/m2 = 600 mg, Intravenous,  Once, 2 of 6 cycles Administration: 600 mg (02/07/2019)  for chemotherapy treatment.       REVIEW OF SYSTEMS:   Constitutional: Denies fevers, chills.  Ears, nose, mouth, throat, and face: Denies mucositis or sore throat Respiratory: Denies cough, dyspnea or wheezes Cardiovascular: Denies palpitation, chest discomfort Gastrointestinal: Denies abdominal pain, nausea, vomiting. Skin: Denies abnormal skin rashes Lymphatics: Denies new lymphadenopathy or easy bruising Neurological: Reports mild peripheral neuropathy to her fingertips, unchanged Behavioral/Psych: Mood is stable, no new changes  Extremities: No lower extremity edema All other systems were reviewed with the patient and are negative.  I have reviewed the past medical history, past surgical history, social history and family history with the patient and they are unchanged from previous note.   PHYSICAL EXAMINATION: ECOG PERFORMANCE STATUS: 0 - Asymptomatic  Vitals:   02/21/19 2005 02/22/19 0539  BP: 128/78 117/77  Pulse: 75 70  Resp: 17 17  Temp: 97.8 F (36.6 C) 97.9 F (36.6 C)  SpO2: 98% 97%   Filed Weights   02/21/19 0944 02/22/19 0700  Weight: 132 lb 3.2 oz (60 kg) 132 lb 6.4 oz (60.1 kg)    Intake/Output from previous day: 10/05 0701 - 10/06 0700 In: 1543.6 [P.O.:720; I.V.:116.4; IV Piggyback:707.2] Out: 200 [Urine:200]  GENERAL:alert, no distress and comfortable SKIN: skin color, texture, turgor are normal, no rashes or significant lesions EYES: normal, Conjunctiva are pink and non-injected, sclera clear OROPHARYNX:no exudate, no erythema and lips, buccal mucosa, and tongue normal  NECK: supple, thyroid normal size, non-tender, without nodularity LYMPH:  no palpable lymphadenopathy in the  cervical, axillary or inguinal LUNGS: Clear to auscultation bilaterally, no respiratory distress. HEART: regular rate & rhythm and no murmurs and no lower extremity  edema ABDOMEN:abdomen soft, non-tender and normal bowel sounds Musculoskeletal:no cyanosis of digits and no clubbing  NEURO: alert & oriented x 3 with fluent speech, no focal motor/sensory deficits  LABORATORY DATA:  I have reviewed the data as listed CMP Latest Ref Rng & Units 02/22/2019 02/21/2019 02/15/2019  Glucose 70 - 99 mg/dL 149(H) 96 99  BUN 6 - 20 mg/dL 14 11 9   Creatinine 0.44 - 1.00 mg/dL 0.50 0.62 0.81  Sodium 135 - 145 mmol/L 135 129(L) 134(L)  Potassium 3.5 - 5.1 mmol/L 3.8 3.9 4.1  Chloride 98 - 111 mmol/L 103 97(L) 97(L)  CO2 22 - 32 mmol/L 24 23 27   Calcium 8.9 - 10.3 mg/dL 8.8(L) 8.3(L) 9.2  Total Protein 6.5 - 8.1 g/dL 5.9(L) 6.1(L) 6.7  Total Bilirubin 0.3 - 1.2 mg/dL 0.5 0.4 <0.2(L)  Alkaline Phos 38 - 126 U/L 88 94 153(H)  AST 15 - 41 U/L 22 27 20   ALT 0 - 44 U/L 38 37 28    Lab Results  Component Value Date   WBC 7.0 02/22/2019   HGB 9.1 (L) 02/22/2019   HCT 28.7 (L) 02/22/2019   MCV 92.0 02/22/2019   PLT 359 02/22/2019   NEUTROABS 6.5 02/22/2019    Mr Brain W OZ Contrast  Result Date: 01/27/2019 CLINICAL DATA:  Vertigo episodic. History of lymphoma. Malignant pleural effusion. Crohn's disease. EXAM: MRI HEAD WITHOUT AND WITH CONTRAST TECHNIQUE: Multiplanar, multiecho pulse sequences of the brain and surrounding structures were obtained without and with intravenous contrast. CONTRAST:  4m GADAVIST GADOBUTROL 1 MMOL/ML IV SOLN COMPARISON:  None. FINDINGS: Brain: On the initial scan, the tech inadvertently did not perform coronal postcontrast imaging. The patient returned for postcontrast sagittal coronal and axial imaging as there was question of enhancing lesion in the left anterior putamen on the initial study. This area is not seen on repeat imaging and is felt to be artifact on the initial study. No enhancing mass lesion identified. Ventricle size normal. Cerebral volume normal. Negative for acute infarct. Mild periventricular white matter hyperintensity  bilaterally appears chronic. No hemorrhage mass or edema. Vascular: Normal arterial flow voids. Skull and upper cervical spine: Negative Sinuses/Orbits: Mucosal edema paranasal sinuses.  Normal orbit Other: None IMPRESSION: No acute abnormality.  No acute infarct or mass Repeat imaging fails to confirm a small enhancing lesion left anterior putamen which is felt to be an artifact on the initial axial sequence postcontrast. Electronically Signed   By: CFranchot GalloM.D.   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 EXAM: NUCLEAR MEDICINE PET SKULL BASE TO THIGH TECHNIQUE: 6.6 mCi F-18 FDG was injected intravenously. Full-ring PET imaging was performed from the skull base to thigh after the radiotracer. CT data was obtained and used for attenuation correction and anatomic localization. Fasting blood glucose: 98 mg/dl COMPARISON:  11/10/2018 PET-CT. FINDINGS: Mediastinal blood pool activity: SUV max 1.9 Liver activity: SUV max 3.2 NECK: No new or residual hypermetabolic lymph nodes in the neck. Incidental CT findings: Right internal jugular Port-A-Cath terminates at the cavoatrial junction. CHEST: No new or residual enlarged or hypermetabolic axillary, mediastinal or hilar lymph nodes. Representative 0.7 cm left pericardiophrenic node with max SUV 1.0 (series  4/image 91), previously 1.4 cm with max SUV 67.8. no hypermetabolic pulmonary findings. Incidental CT findings: Small left pleural effusion is decreased. No acute consolidative airspace disease, lung masses or significant pulmonary nodules. Atherosclerotic nonaneurysmal thoracic aorta. ABDOMEN/PELVIS: No new or residual hypermetabolic lymph nodes in the abdomen or pelvis. Previously visualized enlarged retroperitoneal, mesenteric and pelvic lymph nodes have all decreased in size.  Representative 1.3 cm central mesenteric node with max SUV 1.6 (series 4/image 124), previously 3.6 cm with max SUV 16.5. There is new mild homogeneous hypermetabolism in the spleen with max SUV 4.7. Spleen is normal size. No focal splenic hypermetabolism. No abnormal hypermetabolic activity within the liver, pancreas or adrenal glands. Incidental CT findings: Atherosclerotic nonaneurysmal abdominal aorta. SKELETON: There is new diffuse hypermetabolism throughout axial and proximal appendicular skeleton with representative max SUV 9.1 in the L3 vertebral body. No focal skeletal hypermetabolism. Incidental CT findings: none IMPRESSION: 1. Findings favor complete metabolic response. No residual hypermetabolism within the lymph nodes of the neck, chest, abdomen or pelvis, which have all decreased in size in the interval. 2. Nonspecific new diffuse skeletal hypermetabolism and new mild splenic hypermetabolism, favor reactive state of the marrow and reticuloendothelial system. Spleen is normal size. Continued surveillance with CT or PET-CT advised. 3. Small layering left pleural effusion, decreased. 4.  Aortic Atherosclerosis (ICD10-I70.0). Electronically Signed   By: Ilona Sorrel M.D.   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 The intent is for cure. -We discussed some of the risks, benefitsandside-effects of Rituximab,Etoposide, Vincristine, Adriamycin,Cytoxan,Prednisone.The regimen requires inpatient admission due to continuous chemotherapy infusion  -Some of the short term  side-effects included, though not limited to, risk of fatigue, weight loss,tumor lysis syndrome, risk of allergic reactions,pancytopenia, life-threatening infections, need for transfusions of blood products, nausea, vomiting, change in bowel habits,hairloss, risk of congestive heart failure, admission to hospital for various reasons, and risks of death.  -Long term side-effects are also discussed includingpermanent damage to nerve function, chronic fatigue, and rare secondary malignancy including bone marrow disorders. -The patient is aware that the response rates discussed earlier is not guaranteed. -After a long discussion, patient made an informed decision to proceed with the prescribed plan of care. -PET scan performed after 2 cycles of R-EPOCH shows a near complete response with no residual or metabolism within the lymph nodes of the neck, chest, abdomen, or pelvis which have all decreased in size. -She tolerated day 1 of cycle 3 of her chemotherapy well overall.  Labs have been reviewed.  She will proceed with day 2 cycle 3 of EPOCH today as scheduled.; she will return to clinic on 02/28/2019 for outpatient Rituximab and Udenyca  -She has antiemetics available to her. 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  This remains overall stable. -Patient denies  any symptoms of bleeding -We will monitor it for now; continue daily CBC with differential  Leukocytosis -Most likely secondary to G-CSF  -White blood cell count is normal this morning -We will monitor it for now  Hyponatremia -Sodium level has  normalized -Continue Pedialyte and gentle salt addition to the diet, and maintain adequate hydration -We will monitor it closely  Hypokalemia -Potassium is normal at 3.8 -Continue potassium chloride 20 mEq daily -Check CMET daily   Chemotherapy-associated nausea  -Secondary to chemotherapy -Symptoms relatively well controlled  -Continue PRN-anti-emetics  All questions were answered. The patient knows to call the clinic with any problems, questions or concerns. No barriers to learning was detected.   LOS: 1 day   Mikey Bussing, DNP, AGPCNP-BC, AOCNP 02/22/19   ADDENDUM  .Patient was Personally and independently interviewed, examined and relevant elements of the history of present illness were reviewed in details and an assessment and plan was created. All elements of the patient's history of present illness , assessment and plan were discussed in details with Mikey Bussing,. The above documentation reflects our combined findings assessment and plan.  Tolerated chemotherapy well EPOCH C3D1. Requested Intrathecal Methotrexate dose 1 of 4 tomorrow by IR- fluoro guided -- orders placed.  Sullivan Lone MD MS

## 2019-02-23 ENCOUNTER — Inpatient Hospital Stay (HOSPITAL_COMMUNITY): Payer: Commercial Managed Care - PPO

## 2019-02-23 LAB — CBC WITH DIFFERENTIAL/PLATELET
Abs Immature Granulocytes: 0.05 10*3/uL (ref 0.00–0.07)
Basophils Absolute: 0 10*3/uL (ref 0.0–0.1)
Basophils Relative: 0 %
Eosinophils Absolute: 0 10*3/uL (ref 0.0–0.5)
Eosinophils Relative: 0 %
HCT: 27.5 % — ABNORMAL LOW (ref 36.0–46.0)
Hemoglobin: 8.6 g/dL — ABNORMAL LOW (ref 12.0–15.0)
Immature Granulocytes: 1 %
Lymphocytes Relative: 5 %
Lymphs Abs: 0.3 10*3/uL — ABNORMAL LOW (ref 0.7–4.0)
MCH: 29.1 pg (ref 26.0–34.0)
MCHC: 31.3 g/dL (ref 30.0–36.0)
MCV: 92.9 fL (ref 80.0–100.0)
Monocytes Absolute: 0.4 10*3/uL (ref 0.1–1.0)
Monocytes Relative: 5 %
Neutro Abs: 6 10*3/uL (ref 1.7–7.7)
Neutrophils Relative %: 89 %
Platelets: 370 10*3/uL (ref 150–400)
RBC: 2.96 MIL/uL — ABNORMAL LOW (ref 3.87–5.11)
RDW: 21 % — ABNORMAL HIGH (ref 11.5–15.5)
WBC: 6.7 10*3/uL (ref 4.0–10.5)
nRBC: 0 % (ref 0.0–0.2)

## 2019-02-23 LAB — COMPREHENSIVE METABOLIC PANEL
ALT: 32 U/L (ref 0–44)
AST: 20 U/L (ref 15–41)
Albumin: 3.2 g/dL — ABNORMAL LOW (ref 3.5–5.0)
Alkaline Phosphatase: 76 U/L (ref 38–126)
Anion gap: 10 (ref 5–15)
BUN: 13 mg/dL (ref 6–20)
CO2: 23 mmol/L (ref 22–32)
Calcium: 8.6 mg/dL — ABNORMAL LOW (ref 8.9–10.3)
Chloride: 103 mmol/L (ref 98–111)
Creatinine, Ser: 0.54 mg/dL (ref 0.44–1.00)
GFR calc Af Amer: >60 mL/min (ref >60)
GFR calc non Af Amer: >60 mL/min (ref >60)
Glucose, Bld: 115 mg/dL — ABNORMAL HIGH (ref 70–99)
Potassium: 3.6 mmol/L (ref 3.5–5.1)
Sodium: 136 mmol/L (ref 135–145)
Total Bilirubin: 0.8 mg/dL (ref 0.3–1.2)
Total Protein: 5.9 g/dL — ABNORMAL LOW (ref 6.5–8.1)

## 2019-02-23 LAB — CSF CELL COUNT WITH DIFFERENTIAL
RBC Count, CSF: 0 /mm3
Tube #: 4
WBC, CSF: 0 /mm3 (ref 0–5)

## 2019-02-23 LAB — PROTEIN, CSF: Total  Protein, CSF: 29 mg/dL (ref 15–45)

## 2019-02-23 LAB — GLUCOSE, CSF: Glucose, CSF: 60 mg/dL (ref 40–70)

## 2019-02-23 MED ORDER — SODIUM CHLORIDE 0.9 % IV SOLN
Freq: Once | INTRAVENOUS | Status: AC
  Administered 2019-02-23: 14:00:00 8 mg via INTRAVENOUS
  Filled 2019-02-23: qty 4

## 2019-02-23 MED ORDER — SODIUM CHLORIDE (PF) 0.9 % IJ SOLN
Freq: Once | INTRAMUSCULAR | Status: AC
  Administered 2019-02-23: 17:00:00 via INTRATHECAL
  Filled 2019-02-23: qty 0.48

## 2019-02-23 MED ORDER — VINCRISTINE SULFATE CHEMO INJECTION 1 MG/ML
Freq: Once | INTRAVENOUS | Status: AC
  Administered 2019-02-23: 17:00:00 via INTRAVENOUS
  Filled 2019-02-23: qty 8

## 2019-02-23 NOTE — TOC Initial Note (Signed)
Transition of Care [facility]) - Initial/Assessment Note    Patient Details  Name: Denise Walls MRN: [id number] Date of Birth: 1961-02-02  [facility]) CM/SW Contact:    Lynnell Catalan, RN Phone Number: 02/23/2019, 1:48 PM  Clinical Narrative:                   Expected Discharge Plan: Home/Self Care Barriers to Discharge: Continued Medical Work up   Patient Goals and CMS Choice        Expected Discharge Plan and Services Expected Discharge Plan: Home/Self Care       Living arrangements for the past 2 months: Single Family Home Expected Discharge Date: (unknown)                                    Prior Living Arrangements/Services Living arrangements for the past 2 months: Single Family Home Lives with:: Spouse                   Activities of Daily Living Home Assistive Devices/Equipment: Eyeglasses ADL Screening (condition at time of admission) Patient's cognitive ability adequate to safely complete daily activities?: Yes Is the patient deaf or have difficulty hearing?: No Does the patient have difficulty seeing, even when wearing glasses/contacts?: No Does the patient have difficulty concentrating, remembering, or making decisions?: No Patient able to express need for assistance with ADLs?: Yes Does the patient have difficulty dressing or bathing?: No Independently performs ADLs?: Yes (appropriate for developmental age) Does the patient have difficulty walking or climbing stairs?: Yes(secondary to weakness) Weakness of Legs: Both Weakness of Arms/Hands: None  Admission diagnosis:  large b cell lymphoma Patient Active Problem List   Diagnosis Date Noted  . Large cell (diffuse) non-Hodgkin's lymphoma ([place])   . Hypokalemia   . Encounter for antineoplastic chemotherapy   . Diffuse large B cell lymphoma ([place]) 01/10/2019  . Chemotherapy-induced nausea 01/04/2019  . Hyponatremia 12/28/2018  . Neutropenia due to and not concurrent  with chemotherapy ([place]) 12/28/2018  . Crohn disease ([place]) 12/28/2018  . Essential hypertension 12/28/2018  . Malignant pleural effusion 12/14/2018  . DLBCL (diffuse large B cell lymphoma) ([place]) 11/16/2018  . Anemia 11/16/2018   PCP:  [name], MD Pharmacy:   [id number] [place] [place] [address]Phone: [phone]: [phone] [id number] [id number] [id number]: [phone]: [phone]   Social Determinants of Health (SDOH) Interventions    Readmission Risk Interventions Readmission Risk Prevention Plan 02/23/2019  Transportation Screening Complete  PCP or Specialist Appt within 3-5 Days Not Complete  Not Complete comments Not ready for dc  [place]RI or [place] Complete  HRI or Home Care Consult comments NA  Social Work Consult for [place]/Counseling Not Complete  SW consult not completed comments NA  Palliative Care Screening Not Applicable  Medication Review (RPress photographerComplete  Some recent data might be hidden

## 2019-02-23 NOTE — Progress Notes (Addendum)
HEMATOLOGY-ONCOLOGY PROGRESS NOTE DOS .02/23/2019   SUBJECTIVE: Continues to tolerate her chemotherapy well overall.  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  Denies mucositis.  Denies abdominal pain, nausea, vomiting.  Bowels are moving well.  Feels somewhat anxious about the intrathecal chemotherapy.  She has no other complaints this morning.  Oncology History  DLBCL (diffuse large B cell lymphoma) ([place])  10/29/2018 Imaging   CT abdomen/pelvis: IMPRESSION: Abdominopelvic lymphadenopathy, including a dominant 3.2 cm short axis jejunal mesentery nodal mass encasing the SMA, partially necrotic. Overall appearance favors lymphoma, although nodal metastases are also possible.   Spleen is normal in size. However, hypoenhancing lesions are suspected on the portal venous phase, raising the possibility of lymphomatous involvement.   Suspected subcarinal nodal mass, incompletely evaluated. Consider CT chest or PET-CT for further evaluation.   Moderate left pleural effusion. Associated left lower lobe opacity, likely atelectasis.   11/10/2018 Imaging   PET: IMPRESSION: 1. 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 2. Nodal metastasis include the lower neck, mediastinum, mesentery, peritoneum and retroperitoneum, and iliac lymph nodes. 3. Spleen and bone marrow normal. 4. Moderate LEFT pleural effusion. 5. Target lymph nodes for sampling could include the LEFT external iliac lymph node, precordial lymph node in the LEFT upper quadrant, or super clavicular/LEFT sub pectoralis nodes.   12/03/2018 Bone Marrow Biopsy   Bone Marrow, Aspirate,Biopsy, and Clot, left posterior iliac crest - NORMOCELLULAR MARROW WITH FOCAL SMALL LYMPHOID AGGREGATES. - SEE COMMENT. PERIPHERAL BLOOD: - BORDERLINE NORMOCYTIC ANEMIA. Diagnosis Note The marrow has only a few small lymphoid aggregates which are not overtly atypical and  contain a mixture of B-cells and T-cells. Flow cytometry is negative for a monoclonal B-cell population. These findings are not diagnostic of a lymphoproliferative process.  Accession: [id number] Bone Marrow Flow Cytometry - NO MONOCLONAL B-CELL OR PHENOTYPICALLY ABERRANT T-CELL POPULATION IDENTIFIED.   12/03/2018 Imaging   TTE:  1. The left ventricle has normal systolic function with an ejection fraction of 60-65%. The cavity size was normal. Left ventricular diastolic Doppler parameters are consistent with impaired relaxation.  2. The right ventricle has normal systolic function. The cavity was normal. There is no increase in right ventricular wall thickness.  3. Large pleural effusion.  4. Clinical correlation suggested.  5. The mitral valve is grossly normal.  6. The tricuspid valve is grossly normal.  7. The aortic valve is grossly normal. Aortic valve regurgitation was not assessed by color flow Doppler.  8. The aortic root is normal in size and structure.   12/08/2018 Procedure   Left thoracentesis   12/08/2018 Pathology Results   Diagnosis PLEURAL FLUID, LEFT (SPECIMEN 1 OF 1 COLLECTED 12/08/18): - B-CELL LYMPHOMA - SEE COMMENT   12/09/2018 Procedure   Left supraclavicular LN bx    12/09/2018 Pathology Results   Accession: [id number] Lymph node for lymphoma, left deep cervical - DIFFUSE LARGE B-CELL LYMPHOMA - SEE COMMENT   12/21/2018 - 01/10/2019 Chemotherapy   The patient had DOXOrubicin (ADRIAMYCIN) chemo injection 80 mg, 50 mg/m2 = 80 mg, Intravenous,  Once, 1 of 6 cycles Administration: 80 mg (12/21/2018) palonosetron (ALOXI) injection 0.25 mg, 0.25 mg, Intravenous,  Once, 1 of 6 cycles Administration: 0.25 mg (12/21/2018) pegfilgrastim-cbqv (UDENYCA) injection 6 mg, 6 mg, Subcutaneous, Once, 1 of 6 cycles Administration: 6 mg (12/23/2018) vinCRIStine (ONCOVIN) 2 mg in sodium chloride 0.9 % 50 mL chemo infusion, 2 mg, Intravenous,  Once, 1 of 6 cycles Administration: 2 mg  (12/21/2018) riTUXimab (RITUXAN) 600  mg in sodium chloride 0.9 % 250 mL (1.9355 mg/mL) infusion, 375 mg/m2 = 600 mg, Intravenous,  Once, 1 of 1 cycle Administration: 600 mg (12/21/2018) cyclophosphamide (CYTOXAN) 1,200 mg in sodium chloride 0.9 % 250 mL chemo infusion, 750 mg/m2 = 1,200 mg, Intravenous,  Once, 1 of 6 cycles Administration: 1,200 mg (12/21/2018) riTUXimab (RITUXAN) 600 mg in sodium chloride 0.9 % 190 mL infusion, 375 mg/m2 = 600 mg (100 % of original dose 375 mg/m2), Intravenous,  Once, 0 of 5 cycles Dose modification: 375 mg/m2 (original dose 375 mg/m2, Cycle 2, Reason: Provider Judgment, Comment: VO to change to RIR) fosaprepitant (EMEND) 150 mg, dexamethasone (DECADRON) 12 mg in sodium chloride 0.9 % 145 mL IVPB, , Intravenous,  Once, 1 of 6 cycles Administration:  (12/21/2018)  for chemotherapy treatment.    01/10/2019 -  Chemotherapy   The patient had DOXOrubicin (ADRIAMYCIN) 16 mg, etoposide (VEPESID) 80 mg, vinCRIStine (ONCOVIN) 0.6 mg in sodium chloride 0.9 % 1,000 mL chemo infusion, , Intravenous, Once, 3 of 5 cycles Administration:  (01/10/2019),  (01/11/2019),  (01/31/2019),  (02/01/2019),  (01/12/2019),  (01/13/2019),  (02/02/2019),  (02/03/2019),  (02/21/2019),  (02/22/2019) ondansetron (ZOFRAN) 8 mg, dexamethasone (DECADRON) 10 mg in sodium chloride 0.9 % 50 mL IVPB, , Intravenous,  Once, 3 of 5 cycles Administration: 18 mg (01/10/2019), 8 mg (01/11/2019), 36 mg (01/14/2019), 8 mg (01/31/2019), 18 mg (02/01/2019), 36 mg (02/04/2019), 18 mg (01/12/2019), 18 mg (01/13/2019), 18 mg (02/02/2019), 18 mg (02/03/2019), 18 mg (02/21/2019), 8 mg (02/22/2019) methotrexate (PF) 12 mg, hydrocortisone sodium succinate (SOLU-CORTEF) 50 mg in sodium chloride (PF) 0.9 % INTRATHECAL chemo injection, , Intrathecal,  Once, 1 of 3 cycles cyclophosphamide (CYTOXAN) 1,180 mg in sodium chloride 0.9 % 250 mL chemo infusion, 750 mg/m2 = 1,180 mg, Intravenous,  Once, 3 of 5 cycles Administration: 1,180 mg (01/14/2019), 1,180  mg (02/04/2019)  for chemotherapy treatment.    01/17/2019 -  Chemotherapy   The patient had pegfilgrastim-cbqv [facility]) injection 6 mg, 6 mg, Subcutaneous, Once, 2 of 6 cycles Administration: 6 mg (01/17/2019), 6 mg (02/07/2019) riTUXimab (RITUXAN) 600 mg in sodium chloride 0.9 % 250 mL (1.9355 mg/mL) infusion, 375 mg/m2 = 600 mg, Intravenous,  Once, 2 of 6 cycles Administration: 600 mg (02/07/2019)  for chemotherapy treatment.       REVIEW OF SYSTEMS:   Constitutional: Denies fevers, chills.  Ears, nose, mouth, throat, and face: Denies mucositis or sore throat Respiratory: Denies cough, dyspnea or wheezes Cardiovascular: Denies palpitation, chest discomfort Gastrointestinal: Denies abdominal pain, nausea, vomiting. Skin: Denies abnormal skin rashes Lymphatics: Denies new lymphadenopathy or easy bruising Neurological: Reports mild peripheral neuropathy to her fingertips, unchanged Behavioral/Psych: Mood is stable, no new changes  Extremities: No lower extremity edema All other systems were reviewed with the patient and are negative.  I have reviewed the past medical history, past surgical history, social history and family history with the patient and they are unchanged from previous note.   PHYSICAL EXAMINATION: ECOG PERFORMANCE STATUS: 0 - Asymptomatic  Vitals:   02/22/19 1548 02/23/19 0536  BP: 125/76 (!) 141/78  Pulse: 75 62  Resp: 18 17  Temp: 98.2 F (36.8 C) 98.6 F (37 C)  SpO2: 100% 98%   Filed Weights   02/21/19 0944 02/22/19 0700 02/23/19 0605  Weight: 132 lb 3.2 oz (60 kg) 132 lb 6.4 oz (60.1 kg) 133 lb 9.6 oz (60.6 kg)    Intake/Output from previous day: 10/06 0701 - 10/07 0700 In: 600 [P.O.:600] Out: 700 [Urine:700]  GENERAL:alert, no distress and comfortable SKIN: skin color, texture, turgor are normal, no rashes or significant lesions EYES: normal, Conjunctiva are pink and non-injected, sclera clear OROPHARYNX:no exudate, no erythema and lips,  buccal mucosa, and tongue normal  NECK: supple, thyroid normal size, non-tender, without nodularity LYMPH:  no palpable lymphadenopathy in the cervical, axillary or inguinal LUNGS: Clear to auscultation bilaterally, no respiratory distress. HEART: regular rate & rhythm and no murmurs and no lower extremity edema ABDOMEN:abdomen soft, non-tender and normal bowel sounds Musculoskeletal:no cyanosis of digits and no clubbing  NEURO: alert & oriented x 3 with fluent speech, no focal motor/sensory deficits  LABORATORY DATA:  I have reviewed the data as listed CMP Latest Ref Rng & Units 02/23/2019 02/22/2019 02/21/2019  Glucose 70 - 99 mg/dL 115(H) 149(H) 96  BUN 6 - 20 mg/dL 13 14 11   Creatinine 0.44 - 1.00 mg/dL 0.54 0.50 0.62  Sodium 135 - 145 mmol/L 136 135 129(L)  Potassium 3.5 - 5.1 mmol/L 3.6 3.8 3.9  Chloride 98 - 111 mmol/L 103 103 97(L)  CO2 22 - 32 mmol/L 23 24 23   Calcium 8.9 - 10.3 mg/dL 8.6(L) 8.8(L) 8.3(L)  Total Protein 6.5 - 8.1 g/dL 5.9(L) 5.9(L) 6.1(L)  Total Bilirubin 0.3 - 1.2 mg/dL 0.8 0.5 0.4  Alkaline Phos 38 - 126 U/L 76 88 94  AST 15 - 41 U/L 20 22 27   ALT 0 - 44 U/L 32 38 37    Lab Results  Component Value Date   WBC 6.7 02/23/2019   HGB 8.6 (L) 02/23/2019   HCT 27.5 (L) 02/23/2019   MCV 92.9 02/23/2019   PLT 370 02/23/2019   NEUTROABS 6.0 02/23/2019    Mr Brain W GE Contrast  Result Date: 01/27/2019 CLINICAL DATA:  Vertigo episodic. History of lymphoma. Malignant pleural effusion. Crohn's disease. EXAM: MRI HEAD WITHOUT AND WITH CONTRAST TECHNIQUE: Multiplanar, multiecho pulse sequences of the brain and surrounding structures were obtained without and with intravenous contrast. CONTRAST:  46m GADAVIST GADOBUTROL 1 MMOL/ML IV SOLN COMPARISON:  None. FINDINGS: Brain: On the initial scan, the tech inadvertently did not perform coronal postcontrast imaging. The patient returned for postcontrast sagittal coronal and axial imaging as there was question of enhancing  lesion in the left anterior putamen on the initial study. This area is not seen on repeat imaging and is felt to be artifact on the initial study. No enhancing mass lesion identified. Ventricle size normal. Cerebral volume normal. Negative for acute infarct. Mild periventricular white matter hyperintensity bilaterally appears chronic. No hemorrhage mass or edema. Vascular: Normal arterial flow voids. Skull and upper cervical spine: Negative Sinuses/Orbits: Mucosal edema paranasal sinuses.  Normal orbit Other: None IMPRESSION: No acute abnormality.  No acute infarct or mass Repeat imaging fails to confirm a small enhancing lesion left anterior putamen which is felt to be an artifact on the initial axial sequence postcontrast. Electronically Signed   By: CFranchot GalloM.D.   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 EXAM: NUCLEAR MEDICINE PET SKULL BASE TO THIGH TECHNIQUE: 6.6 mCi F-18 FDG was injected intravenously. Full-ring PET imaging was performed from the skull base to thigh after the radiotracer. CT data was obtained and used for attenuation correction and anatomic localization. Fasting blood glucose: 98 mg/dl COMPARISON:  11/10/2018 PET-CT. FINDINGS: Mediastinal blood pool activity: SUV max 1.9  Liver activity: SUV max 3.2 NECK: No new or residual hypermetabolic lymph nodes in the neck. Incidental CT findings: Right internal jugular Port-A-Cath terminates at the cavoatrial junction. CHEST: No new or residual enlarged or hypermetabolic axillary, mediastinal or hilar lymph nodes. Representative 0.7 cm left pericardiophrenic node with max SUV 1.0 (series 4/image 91), previously 1.4 cm with max SUV 64.4. no hypermetabolic pulmonary findings. Incidental CT findings: Small left pleural effusion is decreased. No acute consolidative  airspace disease, lung masses or significant pulmonary nodules. Atherosclerotic nonaneurysmal thoracic aorta. ABDOMEN/PELVIS: No new or residual hypermetabolic lymph nodes in the abdomen or pelvis. Previously visualized enlarged retroperitoneal, mesenteric and pelvic lymph nodes have all decreased in size. Representative 1.3 cm central mesenteric node with max SUV 1.6 (series 4/image 124), previously 3.6 cm with max SUV 16.5. There is new mild homogeneous hypermetabolism in the spleen with max SUV 4.7. Spleen is normal size. No focal splenic hypermetabolism. No abnormal hypermetabolic activity within the liver, pancreas or adrenal glands. Incidental CT findings: Atherosclerotic nonaneurysmal abdominal aorta. SKELETON: There is new diffuse hypermetabolism throughout axial and proximal appendicular skeleton with representative max SUV 9.1 in the L3 vertebral body. No focal skeletal hypermetabolism. Incidental CT findings: none IMPRESSION: 1. Findings favor complete metabolic response. No residual hypermetabolism within the lymph nodes of the neck, chest, abdomen or pelvis, which have all decreased in size in the interval. 2. Nonspecific new diffuse skeletal hypermetabolism and new mild splenic hypermetabolism, favor reactive state of the marrow and reticuloendothelial system. Spleen is normal size. Continued surveillance with CT or PET-CT advised. 3. Small layering left pleural effusion, decreased. 4.  Aortic Atherosclerosis (ICD10-I70.0). Electronically Signed   By: Ilona Sorrel M.D.   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 The intent is for cure. -We discussed some of the risks, benefitsandside-effects of Rituximab,Etoposide, Vincristine, Adriamycin,Cytoxan,Prednisone.The regimen requires inpatient admission due to continuous chemotherapy infusion  -Some of the short term side-effects included, though not limited to, risk of fatigue, weight loss,tumor lysis syndrome, risk of allergic reactions,pancytopenia, life-threatening infections, need for transfusions of blood products, nausea, vomiting, change in bowel habits,hairloss, risk of congestive heart failure, admission to hospital for various reasons, and risks of death.  -Long term side-effects are also discussed includingpermanent damage to nerve function, chronic fatigue, and rare secondary malignancy including bone marrow disorders. -The patient is aware that the response rates discussed earlier is not guaranteed. -After a long discussion, patient made an informed decision to proceed with the prescribed plan of care. -PET scan performed after 2 cycles of R-EPOCH shows a near complete response with no residual or metabolism within the lymph nodes of the neck, chest, abdomen, or pelvis which have all decreased in size. -She tolerated day 2 of cycle 3 of her chemotherapy well overall.  Labs have been reviewed.  She will proceed with day 3 cycle 3 of EPOCH today as scheduled.; she will return to clinic on 02/28/2019 for outpatient Rituximab and Udenyca  -She has antiemetics available to her. -We will plan to initiate intrathecal chemotherapy, plan for 4 treatments.  -Anticipate intrathecal chemotherapy administration today by interventional radiology. 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  This remains overall stable. -Patient denies any symptoms of bleeding -We will monitor it for now; continue daily CBC with differential  Leukocytosis -Most likely secondary to G-CSF  -White blood cell count is normal this morning -We will monitor it for now  Hyponatremia -Sodium level has normalized -Continue Pedialyte and gentle salt addition to the diet, and maintain adequate hydration -We will monitor it closely  Hypokalemia -Potassium is normal at 3.6 -Continue potassium chloride 20 mEq daily -Check CMET daily   Chemotherapy-associated nausea  -Secondary to chemotherapy -Symptoms relatively well controlled  -Continue PRN-anti-emetics  All questions were answered. The patient knows to call the clinic with any problems, questions or concerns. No barriers to learning was detected.   LOS: 2 days   Mikey Bussing, DNP, AGPCNP-BC, AOCNP 02/23/19   ADDENDUM  .Patient was Personally and independently interviewed, examined and relevant elements of the history of present illness were reviewed in details and an assessment and plan was created. All elements of the patient's history of present illness , assessment and plan were discussed in details with Mikey Bussing, DNP. The above documentation reflects our combined findings assessment and plan.  No prohibitive toxicities from [place] today. Received 1st dose of IT Methotrexate for CNS prophylaxis without any immediate issues. No headache/nausea or back pain. Reinforced post spinal precautions.  Sullivan Lone MD MS

## 2019-02-23 NOTE — Procedures (Signed)
Procedure: fluoro guided LP at L2-3 with collection of CSF for analysis followed by injection of chemotherapy. Specimen: CSF - to lab Bleeding: none. Complications: None immediate. Patient   -Condition: Stable.  -Disposition:  Inpatient - returned to inpt ward.  full Radiology Report to follow under IMAGING

## 2019-02-24 ENCOUNTER — Ambulatory Visit: Payer: Commercial Managed Care - PPO

## 2019-02-24 DIAGNOSIS — C8339 Diffuse large B-cell lymphoma, extranodal and solid organ sites: Secondary | ICD-10-CM

## 2019-02-24 LAB — CBC WITH DIFFERENTIAL/PLATELET
Abs Immature Granulocytes: 0.03 10*3/uL (ref 0.00–0.07)
Basophils Absolute: 0 10*3/uL (ref 0.0–0.1)
Basophils Relative: 0 %
Eosinophils Absolute: 0 10*3/uL (ref 0.0–0.5)
Eosinophils Relative: 0 %
HCT: 28.7 % — ABNORMAL LOW (ref 36.0–46.0)
Hemoglobin: 9.1 g/dL — ABNORMAL LOW (ref 12.0–15.0)
Immature Granulocytes: 1 %
Lymphocytes Relative: 5 %
Lymphs Abs: 0.2 10*3/uL — ABNORMAL LOW (ref 0.7–4.0)
MCH: 29.2 pg (ref 26.0–34.0)
MCHC: 31.7 g/dL (ref 30.0–36.0)
MCV: 92 fL (ref 80.0–100.0)
Monocytes Absolute: 0.1 10*3/uL (ref 0.1–1.0)
Monocytes Relative: 4 %
Neutro Abs: 3.3 10*3/uL (ref 1.7–7.7)
Neutrophils Relative %: 90 %
Platelets: 371 10*3/uL (ref 150–400)
RBC: 3.12 MIL/uL — ABNORMAL LOW (ref 3.87–5.11)
RDW: 20.8 % — ABNORMAL HIGH (ref 11.5–15.5)
WBC: 3.7 10*3/uL — ABNORMAL LOW (ref 4.0–10.5)
nRBC: 0 % (ref 0.0–0.2)

## 2019-02-24 LAB — COMPREHENSIVE METABOLIC PANEL
ALT: 28 U/L (ref 0–44)
AST: 16 U/L (ref 15–41)
Albumin: 3.2 g/dL — ABNORMAL LOW (ref 3.5–5.0)
Alkaline Phosphatase: 72 U/L (ref 38–126)
Anion gap: 11 (ref 5–15)
BUN: 13 mg/dL (ref 6–20)
CO2: 23 mmol/L (ref 22–32)
Calcium: 8.6 mg/dL — ABNORMAL LOW (ref 8.9–10.3)
Chloride: 101 mmol/L (ref 98–111)
Creatinine, Ser: 0.57 mg/dL (ref 0.44–1.00)
GFR calc Af Amer: >60 mL/min (ref >60)
GFR calc non Af Amer: >60 mL/min (ref >60)
Glucose, Bld: 130 mg/dL — ABNORMAL HIGH (ref 70–99)
Potassium: 3.5 mmol/L (ref 3.5–5.1)
Sodium: 135 mmol/L (ref 135–145)
Total Bilirubin: 0.6 mg/dL (ref 0.3–1.2)
Total Protein: 5.6 g/dL — ABNORMAL LOW (ref 6.5–8.1)

## 2019-02-24 LAB — CYTOLOGY - NON PAP

## 2019-02-24 MED ORDER — SODIUM CHLORIDE 0.9 % IV SOLN
Freq: Once | INTRAVENOUS | Status: AC
  Administered 2019-02-24: 8 mg via INTRAVENOUS
  Filled 2019-02-24: qty 4

## 2019-02-24 MED ORDER — VINCRISTINE SULFATE CHEMO INJECTION 1 MG/ML
Freq: Once | INTRAVENOUS | Status: AC
  Administered 2019-02-24: 17:00:00 via INTRAVENOUS
  Filled 2019-02-24: qty 8

## 2019-02-24 MED ORDER — PREDNISONE 20 MG PO TABS
60.0000 mg | ORAL_TABLET | Freq: Every day | ORAL | Status: AC
  Administered 2019-02-24 – 2019-02-25 (×2): 60 mg via ORAL
  Filled 2019-02-24 (×2): qty 3

## 2019-02-25 LAB — CBC WITH DIFFERENTIAL/PLATELET
Abs Immature Granulocytes: 0.01 10*3/uL (ref 0.00–0.07)
Basophils Absolute: 0 10*3/uL (ref 0.0–0.1)
Basophils Relative: 0 %
Eosinophils Absolute: 0 10*3/uL (ref 0.0–0.5)
Eosinophils Relative: 0 %
HCT: 28.7 % — ABNORMAL LOW (ref 36.0–46.0)
Hemoglobin: 9.3 g/dL — ABNORMAL LOW (ref 12.0–15.0)
Immature Granulocytes: 0 %
Lymphocytes Relative: 8 %
Lymphs Abs: 0.2 10*3/uL — ABNORMAL LOW (ref 0.7–4.0)
MCH: 29.4 pg (ref 26.0–34.0)
MCHC: 32.4 g/dL (ref 30.0–36.0)
MCV: 90.8 fL (ref 80.0–100.0)
Monocytes Absolute: 0.1 10*3/uL (ref 0.1–1.0)
Monocytes Relative: 4 %
Neutro Abs: 2.5 10*3/uL (ref 1.7–7.7)
Neutrophils Relative %: 88 %
Platelets: 394 10*3/uL (ref 150–400)
RBC: 3.16 MIL/uL — ABNORMAL LOW (ref 3.87–5.11)
RDW: 20.1 % — ABNORMAL HIGH (ref 11.5–15.5)
WBC: 2.9 10*3/uL — ABNORMAL LOW (ref 4.0–10.5)
nRBC: 0 % (ref 0.0–0.2)

## 2019-02-25 LAB — COMPREHENSIVE METABOLIC PANEL
ALT: 33 U/L (ref 0–44)
AST: 19 U/L (ref 15–41)
Albumin: 3.4 g/dL — ABNORMAL LOW (ref 3.5–5.0)
Alkaline Phosphatase: 73 U/L (ref 38–126)
Anion gap: 12 (ref 5–15)
BUN: 14 mg/dL (ref 6–20)
CO2: 23 mmol/L (ref 22–32)
Calcium: 8.9 mg/dL (ref 8.9–10.3)
Chloride: 99 mmol/L (ref 98–111)
Creatinine, Ser: 0.59 mg/dL (ref 0.44–1.00)
GFR calc Af Amer: >60 mL/min (ref >60)
GFR calc non Af Amer: >60 mL/min (ref >60)
Glucose, Bld: 111 mg/dL — ABNORMAL HIGH (ref 70–99)
Potassium: 3.4 mmol/L — ABNORMAL LOW (ref 3.5–5.1)
Sodium: 134 mmol/L — ABNORMAL LOW (ref 135–145)
Total Bilirubin: 0.4 mg/dL (ref 0.3–1.2)
Total Protein: 5.8 g/dL — ABNORMAL LOW (ref 6.5–8.1)

## 2019-02-25 MED ORDER — HEPARIN SOD (PORK) LOCK FLUSH 100 UNIT/ML IV SOLN
500.0000 [IU] | Freq: Once | INTRAVENOUS | Status: AC
  Administered 2019-02-25: 500 [IU]
  Filled 2019-02-25: qty 5

## 2019-02-25 MED ORDER — SODIUM CHLORIDE 0.9 % IV SOLN
Freq: Once | INTRAVENOUS | Status: AC
  Administered 2019-02-25: 14:00:00 36 mg via INTRAVENOUS
  Filled 2019-02-25: qty 8

## 2019-02-25 MED ORDER — SODIUM CHLORIDE 0.9 % IV SOLN
750.0000 mg/m2 | Freq: Once | INTRAVENOUS | Status: AC
  Administered 2019-02-25: 15:00:00 1180 mg via INTRAVENOUS
  Filled 2019-02-25: qty 59

## 2019-02-25 NOTE — Discharge Summary (Signed)
.  [place]  [phone] [phone]) [phone]    Physician Discharge Summary     Patient ID: Denise Walls MRN: [id number] DOB/AGE: 23-Mar-1961 58 y.o.  Admit date: 02/21/2019 Discharge date: 02/25/2019  Primary Care Physician:  [name], MD   Discharge Diagnoses:    Present on Admission:  DLBCL (diffuse large B cell lymphoma) [facility])   Discharge Medications:  Allergies as of 02/25/2019      Reactions   Nsaids Anaphylaxis, Other (See Comments)   Abdominal pain, GI Bleeding      Medication List    TAKE these medications   B COMPLEX PO Take 1 tablet by mouth daily.   calcium carbonate 500 MG chewable tablet Commonly known as: TUMS - dosed in mg elemental calcium Chew 2 tablets by mouth daily as needed for indigestion or heartburn.   cholecalciferol 25 MCG (1000 UT) tablet Commonly known as: VITAMIN D3 Take 1 tablet (1,000 Units total) by mouth daily.   lidocaine-prilocaine cream Commonly known as: EMLA Apply to affected area once What changed:   how much to take  how to take this  when to take this  reasons to take this  additional instructions   meclizine 25 MG tablet Commonly known as: ANTIVERT Take 1 tablet (25 mg total) by mouth 3 (three) times daily as needed for dizziness.   mesalamine 1.2 g EC tablet Commonly known as: LIALDA Take 2.4 g by mouth daily.   multivitamin-prenatal 27-0.8 MG Tabs tablet Take 1 tablet by mouth daily at 12 noon.   ondansetron 8 MG tablet Commonly known as: Zofran Take 1 tablet (8 mg total) by mouth 2 (two) times daily as needed for refractory nausea / vomiting. Start on day 3 after cyclophosphamide chemotherapy.   polyethylene glycol 17 g packet Commonly known as: MIRALAX / GLYCOLAX Take 17 g by mouth daily as needed for mild constipation.   potassium chloride SA 20 MEQ tablet Commonly known as: KLOR-CON Take 1 tablet (20 mEq total) by mouth daily.     traZODone 50 MG tablet Commonly known as: DESYREL Take 1 tablet (50 mg total) by mouth at bedtime as needed for sleep. What changed: how much to take        Disposition and Follow-up:   Significant Diagnostic Studies:  Mr Jeri Cos Wo Contrast  Result Date: 01/27/2019 CLINICAL DATA:  Vertigo episodic. History of lymphoma. Malignant pleural effusion. Crohn's disease. EXAM: MRI HEAD WITHOUT AND WITH CONTRAST TECHNIQUE: Multiplanar, multiecho pulse sequences of the brain and surrounding structures were obtained without and with intravenous contrast. CONTRAST:  21m GADAVIST GADOBUTROL 1 MMOL/ML IV SOLN COMPARISON:  None. FINDINGS: Brain: On the initial scan, the tech inadvertently did not perform coronal postcontrast imaging. The patient returned for postcontrast sagittal coronal and axial imaging as there was question of enhancing lesion in the left anterior putamen on the initial study. This area is not seen on repeat imaging and is felt to be artifact on the initial study. No enhancing mass lesion identified. Ventricle size normal. Cerebral volume normal. Negative for acute infarct. Mild periventricular white matter hyperintensity bilaterally appears chronic. No hemorrhage mass or edema. Vascular: Normal arterial flow voids. Skull and upper cervical spine: Negative Sinuses/Orbits: Mucosal edema paranasal sinuses.  Normal orbit Other: None IMPRESSION: No acute abnormality.  No acute infarct or mass Repeat imaging fails to confirm a small enhancing lesion left anterior putamen which is felt to be an artifact on the initial axial sequence postcontrast.  Electronically Signed   By: Franchot Gallo M.D.   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 EXAM: NUCLEAR MEDICINE PET SKULL BASE TO THIGH TECHNIQUE: 6.6 mCi F-18 FDG was  injected intravenously. Full-ring PET imaging was performed from the skull base to thigh after the radiotracer. CT data was obtained and used for attenuation correction and anatomic localization. Fasting blood glucose: 98 mg/dl COMPARISON:  11/10/2018 PET-CT. FINDINGS: Mediastinal blood pool activity: SUV max 1.9 Liver activity: SUV max 3.2 NECK: No new or residual hypermetabolic lymph nodes in the neck. Incidental CT findings: Right internal jugular Port-A-Cath terminates at the cavoatrial junction. CHEST: No new or residual enlarged or hypermetabolic axillary, mediastinal or hilar lymph nodes. Representative 0.7 cm left pericardiophrenic node with max SUV 1.0 (series 4/image 91), previously 1.4 cm with max SUV 55.9. no hypermetabolic pulmonary findings. Incidental CT findings: Small left pleural effusion is decreased. No acute consolidative airspace disease, lung masses or significant pulmonary nodules. Atherosclerotic nonaneurysmal thoracic aorta. ABDOMEN/PELVIS: No new or residual hypermetabolic lymph nodes in the abdomen or pelvis. Previously visualized enlarged retroperitoneal, mesenteric and pelvic lymph nodes have all decreased in size. Representative 1.3 cm central mesenteric node with max SUV 1.6 (series 4/image 124), previously 3.6 cm with max SUV 16.5. There is new mild homogeneous hypermetabolism in the spleen with max SUV 4.7. Spleen is normal size. No focal splenic hypermetabolism. No abnormal hypermetabolic activity within the liver, pancreas or adrenal glands. Incidental CT findings: Atherosclerotic nonaneurysmal abdominal aorta. SKELETON: There is new diffuse hypermetabolism throughout axial and proximal appendicular skeleton with representative max SUV 9.1 in the L3 vertebral body. No focal skeletal hypermetabolism. Incidental CT findings: none IMPRESSION: 1. Findings favor complete metabolic response. No residual hypermetabolism within the lymph nodes of the neck, chest, abdomen or pelvis, which  have all decreased in size in the interval. 2. Nonspecific new diffuse skeletal hypermetabolism and new mild splenic hypermetabolism, favor reactive state of the marrow and reticuloendothelial system. Spleen is normal size. Continued surveillance with CT or PET-CT advised. 3. Small layering left pleural effusion, decreased. 4.  Aortic Atherosclerosis (ICD10-I70.0). Electronically Signed   By: Ilona Sorrel M.D.   On: 02/14/2019 17:28   Dg Fluoro Guide Lumbar Puncture  Result Date: 02/23/2019 CLINICAL DATA:  58 year old female with large B-cell lymphoma. Initial lumbar puncture for CSF analysis and intrathecal chemotherapy. EXAM: FLUOROSCOPICALLY GUIDED LUMBAR PUNCTURE INJECTION OF INTRATHECAL CHEMOTHERAPY FLUOROSCOPY TIME:  0 minutes 18 seconds PROCEDURE: Informed consent was obtained from the patient prior to the procedure with the help of an interpreter. Including potential complications of headache, allergy, and pain. A "time-out" was performed, and this was also checked against the patient's chemotherapy from pharmacy. With the patient prone, the lower back was prepped with Betadine. 1% Lidocaine was used for local anesthesia. Lumbar puncture was performed at the L2-L3 level using left sub laminar and a 3.5 in x 20 gauge needle with return of clear, colorless CSF. First, CSF was collected in 4 test tubes totaling about 21 mL of fluid. Next, a 10 mL chemotherapy mixture of 12 milligrams methotrexate and 50 milligrams Solu-Cortef was injected into the subarachnoid space. The needle was withdrawn, direct pressure held and no bleeding encountered. The patient tolerated the procedure well and without apparent complication. Appropriate post procedural orders were placed on the chart. The patient was returned to  the inpatient floor in stable condition for continued treatment. IMPRESSION: 1. Fluoroscopic guided lumbar puncture at L2-L3. 2. Collection of about 21 mL of CSF, sent to the lab for analysis. 3. Injection  of intrathecal chemotherapy. Electronically Signed   By: Genevie Ann M.D.   On: 02/23/2019 16:49    Discharge Laboratory Values: . CBC Latest Ref Rng & Units 02/25/2019 02/24/2019 02/23/2019  WBC 4.0 - 10.5 K/uL 2.9(L) 3.7(L) 6.7  Hemoglobin 12.0 - 15.0 g/dL 9.3(L) 9.1(L) 8.6(L)  Hematocrit 36.0 - 46.0 % 28.7(L) 28.7(L) 27.5(L)  Platelets 150 - 400 K/uL 394 371 370    . CMP Latest Ref Rng & Units 02/25/2019 02/24/2019 02/23/2019  Glucose 70 - 99 mg/dL 111(H) 130(H) 115(H)  BUN 6 - 20 mg/dL 14 13 13   Creatinine 0.44 - 1.00 mg/dL 0.59 0.57 0.54  Sodium 135 - 145 mmol/L 134(L) 135 136  Potassium 3.5 - 5.1 mmol/L 3.4(L) 3.5 3.6  Chloride 98 - 111 mmol/L 99 101 103  CO2 22 - 32 mmol/L 23 23 23   Calcium 8.9 - 10.3 mg/dL 8.9 8.6(L) 8.6(L)  Total Protein 6.5 - 8.1 g/dL 5.8(L) 5.6(L) 5.9(L)  Total Bilirubin 0.3 - 1.2 mg/dL 0.4 0.6 0.8  Alkaline Phos 38 - 126 U/L 73 72 76  AST 15 - 41 U/L 19 16 20   ALT 0 - 44 U/L 33 28 32     Brief H and P: For complete details please refer to admission H and P, but in brief,   Ms. Tate is a 58 year old female with a past medical history significant for anxiety, Crohn's disease, hypertension, hyperlipidemia, migraine headaches. The patient reports that she has history of Crohn's disease, for which she has been taking mesalamine with very good control of her symptoms. She was previously followed by Dr. Kathy Breach gastroenterology and to transition her care to a different gastroenterologist after Dr.[facility]ptamoved to [facility]. Her new gastroenterologist stopped her mesalamine some time in 2019,and soon after that, she began to develop new onset left lower quadrant discomfort/pain,nonradiating, dull, intermittent, mild to moderate in intensity, exacerbated by eating and improved with defecation. She underwent colonoscopy, which was unremarkable, and was recommended to modify her diet without any improvement. She then re-established her care with Dr. Etter Sjogren  ordered CT abdomen/pelvis that showed diffuse abdominal lymphadenopathy. PET showed diffuse lymph node involvement in the neck, chest, abdomen and the pelvis,and patient was referred to oncology for further evaluation. 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 The patient initially received 1 cycle of R-CHOPwhich she tolerated fairly well with exception of hyponatremia and neutropenia. Prior to her second planned cycle of chemotherapy, she was found to be positive for Myc and BCL-2 rearrangement. She is now receivingR-EPOCH and is here for cycle #3of this chemotherapy.   Today, the patient reports that she is feeling well overall.  Reports some mild neuropathy in her fingertips but not on her feet.  Appetite has been good.  Denies mucositis.  Denies headaches and dizziness.  Reports stable breathing and no cough.  Denies chest pain.  Denies abdominal pain, nausea, vomiting, constipation, diarrhea.  Denies bleeding.  The patient is here for admission for cycle #3 of R-EPOCH.  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 PLAN -She tolerated her third cycle of R-EPOCH without any prohibitive toxicities. -tolerated first cycle of CNS prophylaxis with IT MTX without any prohibitive toxicities.  Had some sense of head heaviness but no overt headaches or back pain.  This sensation resolved in 24 hours. -she will return to clinic on 02/28/2019 for outpatient Rituximab and Udenyca She has outpatient follow-up with MD visit and labs on 03/07/2019 -She has antiemetics available to her.  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  This remains overall stable. -Patient denies any symptoms of bleeding -We will monitor it for now-no indication for PRBC transfusion  Leukocytosis -Most likely secondary to G-CSF  -Now resolved  Hyponatremia -Sodium level has normalized -Patient was recommended to increase the salt in her diet for now  Hypokalemia -Potassium is normal at 3.5-Continue potassium chloride 20 mEq daily -Check CMET daily   Chemotherapy-associated nausea  -Secondary to chemotherapy -Symptoms relatively well controlled  -Continue PRN-anti-emetics   Physical Exam at Discharge: BP (!) 153/61 (BP Location: Left Arm)    Pulse 73    Temp 98.5 F (36.9 C) (Oral)    Resp 18    Ht 4' 11"  (1.499 m)    Wt 132 lb 14.4 oz (60.3 kg)    LMP  (LMP Unknown)    SpO2 98%    BMI 26.84 kg/m  . GENERAL:alert, in no acute distress and comfortable SKIN: no acute rashes, no significant lesions EYES: conjunctiva are pink and non-injected, sclera anicteric OROPHARYNX: MMM, no exudates, no oropharyngeal erythema or ulceration NECK: supple, no JVD LYMPH:  no palpable lymphadenopathy in the cervical, axillary or inguinal regions LUNGS: clear to auscultation b/l with normal respiratory effort HEART: regular rate & rhythm ABDOMEN:  normoactive bowel sounds , non tender, not distended. Extremity: no pedal edema PSYCH: alert & oriented x 3 with fluent speech NEURO: no focal motor/sensory deficits  Hospital Course:  Active Problems:   DLBCL (diffuse large B cell lymphoma) (HCC)   Large cell (diffuse) non-Hodgkin's lymphoma (HCC)   Diet:  Regular  Activity:  As instructed with infection precautions  Condition at Discharge:   Stable  Signed: Dr. Sullivan Lone MD [place] [phone]  02/25/2019, 12:06 PM   TT spent discharging patient>76mns

## 2019-02-25 NOTE — Progress Notes (Signed)
Calculation and dosage of cytoxan verified by Aldean Baker, rn

## 2019-02-25 NOTE — Progress Notes (Signed)
HEMATOLOGY-ONCOLOGY PROGRESS NOTE  SUBJECTIVE:   Patient notes some fatigue after the IT MTx yesterday. No headache or back pain.  Continues to tolerate her chemotherapy well overall.  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  Denies mucositis.  Denies abdominal pain, nausea, vomiting.  Bowels are moving well.  Feels somewhat anxious about the intrathecal chemotherapy.  She has no other complaints this morning.  Oncology History  DLBCL (diffuse large B cell lymphoma) ([place])  10/29/2018 Imaging   CT abdomen/pelvis: IMPRESSION: Abdominopelvic lymphadenopathy, including a dominant 3.2 cm short axis jejunal mesentery nodal mass encasing the SMA, partially necrotic. Overall appearance favors lymphoma, although nodal metastases are also possible.   Spleen is normal in size. However, hypoenhancing lesions are suspected on the portal venous phase, raising the possibility of lymphomatous involvement.   Suspected subcarinal nodal mass, incompletely evaluated. Consider CT chest or PET-CT for further evaluation.   Moderate left pleural effusion. Associated left lower lobe opacity, likely atelectasis.   11/10/2018 Imaging   PET: IMPRESSION: 1. 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 2. Nodal metastasis include the lower neck, mediastinum, mesentery, peritoneum and retroperitoneum, and iliac lymph nodes. 3. Spleen and bone marrow normal. 4. Moderate LEFT pleural effusion. 5. Target lymph nodes for sampling could include the LEFT external iliac lymph node, precordial lymph node in the LEFT upper quadrant, or super clavicular/LEFT sub pectoralis nodes.   12/03/2018 Bone Marrow Biopsy   Bone Marrow, Aspirate,Biopsy, and Clot, left posterior iliac crest - NORMOCELLULAR MARROW WITH FOCAL SMALL LYMPHOID AGGREGATES. - SEE COMMENT. PERIPHERAL BLOOD: - BORDERLINE NORMOCYTIC ANEMIA. Diagnosis Note The marrow has  only a few small lymphoid aggregates which are not overtly atypical and contain a mixture of B-cells and T-cells. Flow cytometry is negative for a monoclonal B-cell population. These findings are not diagnostic of a lymphoproliferative process.  Accession: [id number] Bone Marrow Flow Cytometry - NO MONOCLONAL B-CELL OR PHENOTYPICALLY ABERRANT T-CELL POPULATION IDENTIFIED.   12/03/2018 Imaging   TTE:  1. The left ventricle has normal systolic function with an ejection fraction of 60-65%. The cavity size was normal. Left ventricular diastolic Doppler parameters are consistent with impaired relaxation.  2. The right ventricle has normal systolic function. The cavity was normal. There is no increase in right ventricular wall thickness.  3. Large pleural effusion.  4. Clinical correlation suggested.  5. The mitral valve is grossly normal.  6. The tricuspid valve is grossly normal.  7. The aortic valve is grossly normal. Aortic valve regurgitation was not assessed by color flow Doppler.  8. The aortic root is normal in size and structure.   12/08/2018 Procedure   Left thoracentesis   12/08/2018 Pathology Results   Diagnosis PLEURAL FLUID, LEFT (SPECIMEN 1 OF 1 COLLECTED 12/08/18): - B-CELL LYMPHOMA - SEE COMMENT   12/09/2018 Procedure   Left supraclavicular LN bx    12/09/2018 Pathology Results   Accession: [id number] Lymph node for lymphoma, left deep cervical - DIFFUSE LARGE B-CELL LYMPHOMA - SEE COMMENT   12/21/2018 - 01/10/2019 Chemotherapy   The patient had DOXOrubicin (ADRIAMYCIN) chemo injection 80 mg, 50 mg/m2 = 80 mg, Intravenous,  Once, 1 of 6 cycles Administration: 80 mg (12/21/2018) palonosetron (ALOXI) injection 0.25 mg, 0.25 mg, Intravenous,  Once, 1 of 6 cycles Administration: 0.25 mg (12/21/2018) pegfilgrastim-cbqv (UDENYCA) injection 6 mg, 6 mg, Subcutaneous, Once, 1 of 6 cycles Administration: 6 mg (12/23/2018) vinCRIStine (ONCOVIN) 2 mg in sodium chloride 0.9 % 50 mL chemo  infusion, 2 mg,  Intravenous,  Once, 1 of 6 cycles Administration: 2 mg (12/21/2018) riTUXimab (RITUXAN) 600 mg in sodium chloride 0.9 % 250 mL (1.9355 mg/mL) infusion, 375 mg/m2 = 600 mg, Intravenous,  Once, 1 of 1 cycle Administration: 600 mg (12/21/2018) cyclophosphamide (CYTOXAN) 1,200 mg in sodium chloride 0.9 % 250 mL chemo infusion, 750 mg/m2 = 1,200 mg, Intravenous,  Once, 1 of 6 cycles Administration: 1,200 mg (12/21/2018) riTUXimab (RITUXAN) 600 mg in sodium chloride 0.9 % 190 mL infusion, 375 mg/m2 = 600 mg (100 % of original dose 375 mg/m2), Intravenous,  Once, 0 of 5 cycles Dose modification: 375 mg/m2 (original dose 375 mg/m2, Cycle 2, Reason: Provider Judgment, Comment: VO to change to RIR) fosaprepitant (EMEND) 150 mg, dexamethasone (DECADRON) 12 mg in sodium chloride 0.9 % 145 mL IVPB, , Intravenous,  Once, 1 of 6 cycles Administration:  (12/21/2018)  for chemotherapy treatment.    01/10/2019 -  Chemotherapy   The patient had DOXOrubicin (ADRIAMYCIN) 16 mg, etoposide (VEPESID) 80 mg, vinCRIStine (ONCOVIN) 0.6 mg in sodium chloride 0.9 % 1,000 mL chemo infusion, , Intravenous, Once, 3 of 5 cycles Administration:  (01/10/2019),  (01/11/2019),  (01/31/2019),  (02/01/2019),  (01/12/2019),  (01/13/2019),  (02/02/2019),  (02/03/2019),  (02/21/2019),  (02/22/2019),  (02/23/2019) ondansetron (ZOFRAN) 8 mg, dexamethasone (DECADRON) 10 mg in sodium chloride 0.9 % 50 mL IVPB, , Intravenous,  Once, 3 of 5 cycles Administration: 18 mg (01/10/2019), 8 mg (01/11/2019), 36 mg (01/14/2019), 8 mg (01/31/2019), 18 mg (02/01/2019), 36 mg (02/04/2019), 18 mg (01/12/2019), 18 mg (01/13/2019), 18 mg (02/02/2019), 18 mg (02/03/2019), 18 mg (02/21/2019), 8 mg (02/22/2019), 8 mg (02/23/2019) methotrexate (PF) 12 mg, hydrocortisone sodium succinate (SOLU-CORTEF) 50 mg in sodium chloride (PF) 0.9 % INTRATHECAL chemo injection, , Intrathecal,  Once, 1 of 3 cycles Administration:  (02/23/2019) cyclophosphamide (CYTOXAN) 1,180 mg in sodium  chloride 0.9 % 250 mL chemo infusion, 750 mg/m2 = 1,180 mg, Intravenous,  Once, 3 of 5 cycles Administration: 1,180 mg (01/14/2019), 1,180 mg (02/04/2019)  for chemotherapy treatment.    01/17/2019 -  Chemotherapy   The patient had pegfilgrastim-cbqv [facility]) injection 6 mg, 6 mg, Subcutaneous, Once, 2 of 6 cycles Administration: 6 mg (01/17/2019), 6 mg (02/07/2019) riTUXimab (RITUXAN) 600 mg in sodium chloride 0.9 % 250 mL (1.9355 mg/mL) infusion, 375 mg/m2 = 600 mg, Intravenous,  Once, 2 of 6 cycles Administration: 600 mg (02/07/2019)  for chemotherapy treatment.       REVIEW OF SYSTEMS:   .10 Point review of Systems was done is negative except as noted above.   I have reviewed the past medical history, past surgical history, social history and family history with the patient and they are unchanged from previous note.   PHYSICAL EXAMINATION: ECOG PERFORMANCE STATUS: 0 - Asymptomatic  Vitals:   02/24/19 1316 02/24/19 2109  BP: 138/77 136/74  Pulse: 68 65  Resp: 18 20  Temp: 98.1 F (36.7 C) 97.7 F (36.5 C)  SpO2: 99% 96%   Filed Weights   02/21/19 0944 02/22/19 0700 02/23/19 0605  Weight: 132 lb 3.2 oz (60 kg) 132 lb 6.4 oz (60.1 kg) 133 lb 9.6 oz (60.6 kg)    Intake/Output from previous day: 10/08 0701 - 10/09 0700 In: 2194.4 [P.O.:1320; I.V.:604.1; IV Piggyback:270.3] Out: 4 [Urine:4]  . GENERAL:alert, in no acute distress and comfortable SKIN: no acute rashes, no significant lesions EYES: conjunctiva are pink and non-injected, sclera anicteric OROPHARYNX: MMM, no exudates, no oropharyngeal erythema or ulceration NECK: supple, no JVD LYMPH:  no palpable  lymphadenopathy in the cervical, axillary or inguinal regions LUNGS: clear to auscultation b/l with normal respiratory effort HEART: regular rate & rhythm ABDOMEN:  normoactive bowel sounds , non tender, not distended. Extremity: no pedal edema PSYCH: alert & oriented x 3 with fluent speech NEURO: no focal  motor/sensory deficits   LABORATORY DATA:  I have reviewed the data as listed  CMP Latest Ref Rng & Units 02/24/2019 02/23/2019 02/22/2019  Glucose 70 - 99 mg/dL 130(H) 115(H) 149(H)  BUN 6 - 20 mg/dL _0 Creatinine 0.44 - 1.00 mg/dL 0.57 0.54 0.50  Sodium 135 - 145 mmol/L 135 136 135  Potassium 3.5 - 5.1 mmol/L 3.5 3.6 3.8  Chloride 98 - 111 mmol/L 101 103 103  CO2 22 - 32 mmol/L _1 Calcium 8.9 - 10.3 mg/dL 8.6(L) 8.6(L) 8.8(L)  Total Protein 6.5 - 8.1 g/dL 5.6(L) 5.9(L) 5.9(L)  Total Bilirubin 0.3 - 1.2 mg/dL 0.6 0.8 0.5  Alkaline Phos 38 - 126 U/L 72 76 88  AST 15 - 41 U/L _2 ALT 0 - 44 U/L 28 32 38    Lab Results  Component Value Date   WBC 3.7 (L) 02/24/2019   HGB 9.1 (L) 02/24/2019   HCT 28.7 (L) 02/24/2019   MCV 92.0 02/24/2019   PLT 371 02/24/2019   NEUTROABS 3.3 02/24/2019   . CMP Latest Ref Rng & Units 02/24/2019 02/23/2019 02/22/2019  Glucose 70 - 99 mg/dL 130(H) 115(H) 149(H)  BUN 6 - 20 mg/dL _3 Creatinine 0.44 - 1.00 mg/dL 0.57 0.54 0.50  Sodium 135 - 145 mmol/L 135 136 135  Potassium 3.5 - 5.1 mmol/L 3.5 3.6 3.8  Chloride 98 - 111 mmol/L 101 103 103  CO2 22 - 32 mmol/L _4 Calcium 8.9 - 10.3 mg/dL 8.6(L) 8.6(L) 8.8(L)  Total Protein 6.5 - 8.1 g/dL 5.6(L) 5.9(L) 5.9(L)  Total Bilirubin 0.3 - 1.2 mg/dL 0.6 0.8 0.5  Alkaline Phos 38 - 126 U/L 72 76 88  AST 15 - 41 U/L _5 ALT 0 - 44 U/L 28 32 38    Mr Brain W Wo Contrast  Result Date: 01/27/2019 CLINICAL DATA:  Vertigo episodic. History of lymphoma. Malignant pleural effusion. Crohn's disease. EXAM: MRI HEAD WITHOUT AND WITH CONTRAST TECHNIQUE: Multiplanar, multiecho pulse sequences of the brain and surrounding structures were obtained without and with intravenous contrast. CONTRAST:  51m GADAVIST GADOBUTROL 1 MMOL/ML IV SOLN COMPARISON:  None. FINDINGS: Brain: On the initial scan, the tech inadvertently did not perform coronal postcontrast imaging. The patient returned  for postcontrast sagittal coronal and axial imaging as there was question of enhancing lesion in the left anterior putamen on the initial study. This area is not seen on repeat imaging and is felt to be artifact on the initial study. No enhancing mass lesion identified. Ventricle size normal. Cerebral volume normal. Negative for acute infarct. Mild periventricular white matter hyperintensity bilaterally appears chronic. No hemorrhage mass or edema. Vascular: Normal arterial flow voids. Skull and upper cervical spine: Negative Sinuses/Orbits: Mucosal edema paranasal sinuses.  Normal orbit Other: None IMPRESSION: No acute abnormality.  No acute infarct or mass Repeat imaging fails to confirm a small enhancing lesion left anterior putamen which is felt to be an artifact on the initial axial sequence postcontrast. Electronically Signed   By: CFranchot GalloM.D.   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 EXAM: NUCLEAR MEDICINE PET SKULL BASE TO THIGH TECHNIQUE: 6.6 mCi F-18 FDG was injected intravenously. Full-ring PET imaging was performed from the skull base to thigh after the radiotracer. CT data was obtained and used for attenuation correction and anatomic localization. Fasting blood glucose: 98 mg/dl COMPARISON:  11/10/2018 PET-CT. FINDINGS: Mediastinal blood pool activity: SUV max 1.9 Liver activity: SUV max 3.2 NECK: No new or residual hypermetabolic lymph nodes in the neck. Incidental CT findings: Right internal jugular Port-A-Cath terminates at the cavoatrial junction. CHEST: No new or residual enlarged or hypermetabolic axillary, mediastinal or hilar lymph nodes. Representative 0.7 cm left pericardiophrenic node with max SUV 1.0 (series 4/image 91), previously 1.4 cm with max SUV 64.4. no hypermetabolic pulmonary findings.  Incidental CT findings: Small left pleural effusion is decreased. No acute consolidative airspace disease, lung masses or significant pulmonary nodules. Atherosclerotic nonaneurysmal thoracic aorta. ABDOMEN/PELVIS: No new or residual hypermetabolic lymph nodes in the abdomen or pelvis. Previously visualized enlarged retroperitoneal, mesenteric and pelvic lymph nodes have all decreased in size. Representative 1.3 cm central mesenteric node with max SUV 1.6 (series 4/image 124), previously 3.6 cm with max SUV 16.5. There is new mild homogeneous hypermetabolism in the spleen with max SUV 4.7. Spleen is normal size. No focal splenic hypermetabolism. No abnormal hypermetabolic activity within the liver, pancreas or adrenal glands. Incidental CT findings: Atherosclerotic nonaneurysmal abdominal aorta. SKELETON: There is new diffuse hypermetabolism throughout axial and proximal appendicular skeleton with representative max SUV 9.1 in the L3 vertebral body. No focal skeletal hypermetabolism. Incidental CT findings: none IMPRESSION: 1. Findings favor complete metabolic response. No residual hypermetabolism within the lymph nodes of the neck, chest, abdomen or pelvis, which have all decreased in size in the interval. 2. Nonspecific new diffuse skeletal hypermetabolism and new mild splenic hypermetabolism, favor reactive state of the marrow and reticuloendothelial system. Spleen is normal size. Continued surveillance with CT or PET-CT advised. 3. Small layering left pleural effusion, decreased. 4.  Aortic Atherosclerosis (ICD10-I70.0). Electronically Signed   By: Ilona Sorrel M.D.   On: 02/14/2019 17:28   Dg Fluoro Guide Lumbar Puncture  Result Date: 02/23/2019 CLINICAL DATA:  58 year old female with large B-cell lymphoma. Initial lumbar puncture for CSF analysis and intrathecal chemotherapy. EXAM: FLUOROSCOPICALLY GUIDED LUMBAR PUNCTURE INJECTION OF INTRATHECAL CHEMOTHERAPY FLUOROSCOPY TIME:  0 minutes 18 seconds  PROCEDURE: Informed consent was obtained from the patient prior to the procedure with the help of an interpreter. Including potential complications of headache, allergy, and pain. A "time-out" was performed, and this was also checked against the patient's chemotherapy from pharmacy. With the patient prone, the lower back was prepped with Betadine. 1% Lidocaine was used for local anesthesia. Lumbar puncture was performed at the L2-L3 level using left sub laminar and a 3.5 in x 20 gauge needle with return of clear, colorless CSF. First, CSF was collected in 4 test tubes totaling about 21 mL of fluid. Next, a 10 mL chemotherapy mixture of 12 milligrams methotrexate and 50 milligrams Solu-Cortef was injected into the subarachnoid space. The needle was withdrawn, direct pressure held and no bleeding encountered. The patient tolerated the procedure well and without apparent complication. Appropriate post procedural orders were placed on the chart. The patient was returned to the inpatient floor in stable condition for continued treatment. IMPRESSION: 1. Fluoroscopic guided lumbar puncture at L2-L3. 2. Collection of about 21 mL of CSF, sent to  the lab for analysis. 3. Injection of intrathecal chemotherapy. Electronically Signed   By: Genevie Ann M.D.   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  Labs have been reviewed.  She will proceed with day 4 cycle 3 of EPOCH today as scheduled. -tolerated IT MTX yesterday without any prohibitive toxicities  -she will return to  clinic on 02/28/2019 for outpatient Rituximab and Udenyca  -She has antiemetics available to her.  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  This remains overall stable. -Patient denies any symptoms of bleeding -We will monitor it for now; continue daily CBC with differential  Leukocytosis -Most likely secondary to G-CSF  -White blood cell count is normal this morning -We will monitor it for now  Hyponatremia -Sodium level has normalized -Continue Pedialyte and gentle salt addition to the diet, and maintain adequate hydration -We will monitor it closely  Hypokalemia -Potassium is normal at 3.5-Continue potassium chloride 20 mEq daily -Check CMET daily   Chemotherapy-associated nausea  -Secondary to chemotherapy -Symptoms relatively well controlled  -Continue PRN-anti-emetics  All questions were answered. The patient knows to call the clinic with any problems, questions or concerns. No barriers to learning was detected.   LOS: 4 days   Sullivan Lone MD MS

## 2019-02-28 ENCOUNTER — Inpatient Hospital Stay: Payer: Commercial Managed Care - PPO | Attending: Hematology

## 2019-02-28 ENCOUNTER — Other Ambulatory Visit: Payer: Self-pay

## 2019-02-28 VITALS — BP 135/84 | HR 86 | Temp 98.7°F | Resp 18

## 2019-02-28 DIAGNOSIS — Z5189 Encounter for other specified aftercare: Secondary | ICD-10-CM | POA: Diagnosis not present

## 2019-02-28 DIAGNOSIS — Z5112 Encounter for antineoplastic immunotherapy: Secondary | ICD-10-CM | POA: Insufficient documentation

## 2019-02-28 DIAGNOSIS — C8330 Diffuse large B-cell lymphoma, unspecified site: Secondary | ICD-10-CM

## 2019-02-28 DIAGNOSIS — C8339 Diffuse large B-cell lymphoma, extranodal and solid organ sites: Secondary | ICD-10-CM | POA: Diagnosis present

## 2019-02-28 MED ORDER — SODIUM CHLORIDE 0.9 % IV SOLN: 375.0000 mg/m2 | Freq: Once | INTRAVENOUS | Status: DC

## 2019-02-28 MED ORDER — ACETAMINOPHEN 325 MG PO TABS
ORAL_TABLET | ORAL | Status: AC
  Filled 2019-02-28: qty 2

## 2019-02-28 MED ORDER — SODIUM CHLORIDE 0.9 % IV SOLN
Freq: Once | INTRAVENOUS | Status: AC
  Administered 2019-02-28: 09:00:00 via INTRAVENOUS
  Filled 2019-02-28: qty 250

## 2019-02-28 MED ORDER — SODIUM CHLORIDE 0.9% FLUSH
10.0000 mL | INTRAVENOUS | Status: DC | PRN
  Administered 2019-02-28: 10 mL
  Filled 2019-02-28: qty 10

## 2019-02-28 MED ORDER — DIPHENHYDRAMINE HCL 25 MG PO CAPS
ORAL_CAPSULE | ORAL | Status: AC
  Filled 2019-02-28: qty 2

## 2019-02-28 MED ORDER — DIPHENHYDRAMINE HCL 25 MG PO CAPS
50.0000 mg | ORAL_CAPSULE | Freq: Once | ORAL | Status: AC
  Administered 2019-02-28: 10:00:00 50 mg via ORAL

## 2019-02-28 MED ORDER — ACETAMINOPHEN 325 MG PO TABS
650.0000 mg | ORAL_TABLET | Freq: Once | ORAL | Status: AC
  Administered 2019-02-28: 650 mg via ORAL

## 2019-02-28 MED ORDER — SODIUM CHLORIDE 0.9 % IV SOLN
375.0000 mg/m2 | Freq: Once | INTRAVENOUS | Status: AC
  Administered 2019-02-28: 600 mg via INTRAVENOUS
  Filled 2019-02-28: qty 10

## 2019-02-28 MED ORDER — PEGFILGRASTIM-CBQV 6 MG/0.6ML ~~LOC~~ SOSY
6.0000 mg | PREFILLED_SYRINGE | Freq: Once | SUBCUTANEOUS | Status: AC
  Administered 2019-02-28: 11:00:00 6 mg via SUBCUTANEOUS

## 2019-02-28 MED ORDER — PEGFILGRASTIM-CBQV 6 MG/0.6ML ~~LOC~~ SOSY
PREFILLED_SYRINGE | SUBCUTANEOUS | Status: AC
  Filled 2019-02-28: qty 0.6

## 2019-02-28 MED ORDER — HEPARIN SOD (PORK) LOCK FLUSH 100 UNIT/ML IV SOLN
500.0000 [IU] | Freq: Once | INTRAVENOUS | Status: AC | PRN
  Administered 2019-02-28: 500 [IU]
  Filled 2019-02-28: qty 5

## 2019-02-28 NOTE — Patient Instructions (Signed)
[facility] Discharge Instructions for Patients Receiving Chemotherapy  Today you received the following chemotherapy agents Rituximab (RITUXAN).  To help prevent nausea and vomiting after your treatment, we encourage you to take your nausea medication as prescribed.   If you develop nausea and vomiting that is not controlled by your nausea medication, call the clinic.   BELOW ARE SYMPTOMS THAT SHOULD BE REPORTED IMMEDIATELY:  *FEVER GREATER THAN 100.5 F  *CHILLS WITH OR WITHOUT FEVER  NAUSEA AND VOMITING THAT IS NOT CONTROLLED WITH YOUR NAUSEA MEDICATION  *UNUSUAL SHORTNESS OF BREATH  *UNUSUAL BRUISING OR BLEEDING  TENDERNESS IN MOUTH AND THROAT WITH OR WITHOUT PRESENCE OF ULCERS  *URINARY PROBLEMS  *BOWEL PROBLEMS  UNUSUAL RASH Items with * indicate a potential emergency and should be followed up as soon as possible.  Feel free to call the clinic should you have any questions or concerns. The clinic phone number is [phone].  Please show the [facility] at check-in to the Emergency Department and triage nurse.  Pegfilgrastim injection What is this medicine? PEGFILGRASTIM (PEG fil gra stim) is a long-acting granulocyte colony-stimulating factor that stimulates the growth of neutrophils, a type of white blood cell important in the body's fight against infection. It is used to reduce the incidence of fever and infection in patients with certain types of cancer who are receiving chemotherapy that affects the bone marrow, and to increase survival after being exposed to high doses of radiation. This medicine may be used for other purposes; ask your health care provider or pharmacist if you have questions. COMMON BRAND NAME(S): Steve Rattler, Ziextenzo What should I tell my health care provider before I take this medicine? They need to know if you have any of these conditions:  kidney disease  latex allergy  ongoing radiation  therapy  sickle cell disease  skin reactions to acrylic adhesives (On-Body Injector only)  an unusual or allergic reaction to pegfilgrastim, filgrastim, other medicines, foods, dyes, or preservatives  pregnant or trying to get pregnant  breast-feeding How should I use this medicine? This medicine is for injection under the skin. If you get this medicine at home, you will be taught how to prepare and give the pre-filled syringe or how to use the On-body Injector. Refer to the patient Instructions for Use for detailed instructions. Use exactly as directed. Tell your healthcare provider immediately if you suspect that the On-body Injector may not have performed as intended or if you suspect the use of the On-body Injector resulted in a missed or partial dose. It is important that you put your used needles and syringes in a special sharps container. Do not put them in a trash can. If you do not have a sharps container, call your pharmacist or healthcare provider to get one. Talk to your pediatrician regarding the use of this medicine in children. While this drug may be prescribed for selected conditions, precautions do apply. Overdosage: If you think you have taken too much of this medicine contact a poison control center or emergency room at once. NOTE: This medicine is only for you. Do not share this medicine with others. What if I miss a dose? It is important not to miss your dose. Call your doctor or health care professional if you miss your dose. If you miss a dose due to an On-body Injector failure or leakage, a new dose should be administered as soon as possible using a single prefilled syringe for manual use. What may  interact with this medicine? Interactions have not been studied. Give your health care provider a list of all the medicines, herbs, non-prescription drugs, or dietary supplements you use. Also tell them if you smoke, drink alcohol, or use illegal drugs. Some items may interact  with your medicine. This list may not describe all possible interactions. Give your health care provider a list of all the medicines, herbs, non-prescription drugs, or dietary supplements you use. Also tell them if you smoke, drink alcohol, or use illegal drugs. Some items may interact with your medicine. What should I watch for while using this medicine? You may need blood work done while you are taking this medicine. If you are going to need a MRI, CT scan, or other procedure, tell your doctor that you are using this medicine (On-Body Injector only). What side effects may I notice from receiving this medicine? Side effects that you should report to your doctor or health care professional as soon as possible:  allergic reactions like skin rash, itching or hives, swelling of the face, lips, or tongue  back pain  dizziness  fever  pain, redness, or irritation at site where injected  pinpoint red spots on the skin  red or dark-brown urine  shortness of breath or breathing problems  stomach or side pain, or pain at the shoulder  swelling  tiredness  trouble passing urine or change in the amount of urine Side effects that usually do not require medical attention (report to your doctor or health care professional if they continue or are bothersome):  bone pain  muscle pain This list may not describe all possible side effects. Call your doctor for medical advice about side effects. You may report side effects to FDA at 1-800-FDA-1088. Where should I keep my medicine? Keep out of the reach of children. If you are using this medicine at home, you will be instructed on how to store it. Throw away any unused medicine after the expiration date on the label. NOTE: This sheet is a summary. It may not cover all possible information. If you have questions about this medicine, talk to your doctor, pharmacist, or health care provider.  2020 Elsevier/Gold Standard (2017-08-10  16:57:08)  Coronavirus (COVID-19) Are you at risk?  Are you at risk for the Coronavirus (COVID-19)?  To be considered HIGH RISK for Coronavirus (COVID-19), you have to meet the following criteria:  . Traveled to [place], [place], [place], [place] or [place]; or in the [place] to [place], [place], [place], or [place]; and have fever, cough, and shortness of breath within the last 2 weeks of travel OR . Been in close contact with a person diagnosed with COVID-19 within the last 2 weeks and have fever, cough, and shortness of breath . IF YOU DO NOT MEET THESE CRITERIA, YOU ARE CONSIDERED LOW RISK FOR COVID-19.  What to do if you are HIGH RISK for COVID-19?  Marland Kitchen If you are having a medical emergency, call 911. . Seek medical care right away. Before you go to a doctor's office, urgent care or emergency department, call ahead and tell them about your recent travel, contact with someone diagnosed with COVID-19, and your symptoms. You should receive instructions from your physician's office regarding next steps of care.  . When you arrive at healthcare provider, tell the healthcare staff immediately you have returned from visiting [place], [place], [place], [place] or [place]; or traveled in the [place] to [place], [place], [place], or [place];  in the last two weeks or you have been in close contact with a person diagnosed with COVID-19 in the last 2 weeks.   . Tell the health care staff about your symptoms: fever, cough and shortness of breath. . After you have been seen by a medical provider, you will be either: o Tested for (COVID-19) and discharged home on quarantine except to seek medical care if symptoms worsen, and asked to  - Stay home and avoid contact with others until you get your results (4-5 days)  - Avoid travel on public transportation if possible (such as bus, train, or airplane) or o Sent to the Emergency Department by EMS for evaluation, COVID-19 testing, and  possible admission depending on your condition and test results.  What to do if you are LOW RISK for COVID-19?  Reduce your risk of any infection by using the same precautions used for avoiding the common cold or flu:  Marland Kitchen Wash your hands often with soap and warm water for at least 20 seconds.  If soap and water are not readily available, use an alcohol-based hand sanitizer with at least 60% alcohol.  . If coughing or sneezing, cover your mouth and nose by coughing or sneezing into the elbow areas of your shirt or coat, into a tissue or into your sleeve (not your hands). . Avoid shaking hands with others and consider head nods or verbal greetings only. . Avoid touching your eyes, nose, or mouth with unwashed hands.  . Avoid close contact with people who are sick. . Avoid places or events with large numbers of people in one location, like concerts or sporting events. . Carefully consider travel plans you have or are making. . If you are planning any travel outside or inside the [place], visit the CDC's Travelers' Health webpage for the latest health notices. . If you have some symptoms but not all symptoms, continue to monitor at home and seek medical attention if your symptoms worsen. . If you are having a medical emergency, call 911.   [place] / e-Visit: [email or web address]         [facility]: [facility]: [phone]                   [facility]: [phone]

## 2019-02-28 NOTE — Progress Notes (Signed)
Changed to Rapid Rituxan.  Pt has rec'vd 2 doses at std rate and tolerated w/o difficulty. Meets criteria for rapid rate. RN aware. Kennith Center, Pharm.D., CPP 02/28/2019@9 :38 AM

## 2019-03-07 ENCOUNTER — Other Ambulatory Visit: Payer: Self-pay

## 2019-03-07 ENCOUNTER — Inpatient Hospital Stay: Payer: Commercial Managed Care - PPO

## 2019-03-07 ENCOUNTER — Inpatient Hospital Stay (HOSPITAL_BASED_OUTPATIENT_CLINIC_OR_DEPARTMENT_OTHER): Payer: Commercial Managed Care - PPO | Admitting: Hematology

## 2019-03-07 VITALS — BP 124/73 | HR 114 | Temp 98.3°F | Resp 18 | Ht 59.0 in | Wt 134.3 lb

## 2019-03-07 DIAGNOSIS — J910 Malignant pleural effusion: Secondary | ICD-10-CM | POA: Diagnosis not present

## 2019-03-07 DIAGNOSIS — D649 Anemia, unspecified: Secondary | ICD-10-CM

## 2019-03-07 DIAGNOSIS — C8339 Diffuse large B-cell lymphoma, extranodal and solid organ sites: Secondary | ICD-10-CM | POA: Diagnosis not present

## 2019-03-07 DIAGNOSIS — Z5112 Encounter for antineoplastic immunotherapy: Secondary | ICD-10-CM | POA: Diagnosis not present

## 2019-03-07 LAB — CBC WITH DIFFERENTIAL (CANCER CENTER ONLY)
Abs Immature Granulocytes: 4.83 10*3/uL — ABNORMAL HIGH (ref 0.00–0.07)
Basophils Absolute: 0.1 10*3/uL (ref 0.0–0.1)
Basophils Relative: 0 %
Eosinophils Absolute: 0 10*3/uL (ref 0.0–0.5)
Eosinophils Relative: 0 %
HCT: 27.1 % — ABNORMAL LOW (ref 36.0–46.0)
Hemoglobin: 8.9 g/dL — ABNORMAL LOW (ref 12.0–15.0)
Immature Granulocytes: 15 %
Lymphocytes Relative: 3 %
Lymphs Abs: 0.9 10*3/uL (ref 0.7–4.0)
MCH: 29.9 pg (ref 26.0–34.0)
MCHC: 32.8 g/dL (ref 30.0–36.0)
MCV: 90.9 fL (ref 80.0–100.0)
Monocytes Absolute: 3.2 10*3/uL — ABNORMAL HIGH (ref 0.1–1.0)
Monocytes Relative: 10 %
Neutro Abs: 24.1 10*3/uL — ABNORMAL HIGH (ref 1.7–7.7)
Neutrophils Relative %: 72 %
Platelet Count: 193 10*3/uL (ref 150–400)
RBC: 2.98 MIL/uL — ABNORMAL LOW (ref 3.87–5.11)
RDW: 21.9 % — ABNORMAL HIGH (ref 11.5–15.5)
Smear Review: 1
WBC Count: 33.2 10*3/uL — ABNORMAL HIGH (ref 4.0–10.5)
nRBC: 0.4 % — ABNORMAL HIGH (ref 0.0–0.2)

## 2019-03-07 LAB — CMP (CANCER CENTER ONLY)
ALT: 20 U/L (ref 0–44)
AST: 21 U/L (ref 15–41)
Albumin: 3.6 g/dL (ref 3.5–5.0)
Alkaline Phosphatase: 156 U/L — ABNORMAL HIGH (ref 38–126)
Anion gap: 13 (ref 5–15)
BUN: 11 mg/dL (ref 6–20)
CO2: 26 mmol/L (ref 22–32)
Calcium: 9.1 mg/dL (ref 8.9–10.3)
Chloride: 96 mmol/L — ABNORMAL LOW (ref 98–111)
Creatinine: 0.88 mg/dL (ref 0.44–1.00)
GFR, Est AFR Am: >60 mL/min (ref >60)
GFR, Estimated: >60 mL/min (ref >60)
Glucose, Bld: 111 mg/dL — ABNORMAL HIGH (ref 70–99)
Potassium: 4.2 mmol/L (ref 3.5–5.1)
Sodium: 135 mmol/L (ref 135–145)
Total Bilirubin: 0.3 mg/dL (ref 0.3–1.2)
Total Protein: 6.4 g/dL — ABNORMAL LOW (ref 6.5–8.1)

## 2019-03-07 LAB — MAGNESIUM: Magnesium: 1.8 mg/dL (ref 1.7–2.4)

## 2019-03-07 NOTE — Progress Notes (Signed)
HEMATOLOGY/ONCOLOGY CONSULTATION NOTE  Date of Service: 03/07/2019  Patient Care Team: [name], MD as PCP - General (Family Medicine)  CHIEF COMPLAINTS/PURPOSE OF CONSULTATION:  DLBCL (diffuse large B cell lymphoma)   HISTORY OF PRESENTING ILLNESS:  Denise Walls is a wonderful 58 y.o. female with a past medical history significant for anxiety, Crohn's disease, hypertension, hyperlipidemia, migraine headaches.  The patient's history is obtained through a Spanish interpreter via video, [place].  The patient reports that she has history of Crohn's disease, for which she has been taking mesalamine with very good control of her symptoms. She was previously followed by Dr. Kathy Breach gastroenterology and to transition her care to a different gastroenterologist after Dr.Guptamoved to [facility]. Her new gastroenterologist stopped her mesalamine some time in 2019,and soon after that, she began to develop new onset left lower quadrant discomfort/pain,nonradiating, dull, intermittent, mild to moderate in intensity, exacerbated by eating and improved with defecation. She underwent colonoscopy, which was unremarkable, and was recommended to modify her diet without any improvement. She then re-established her care with Dr. Etter Sjogren ordered CT abdomen/pelvis that showed diffuse abdominal lymphadenopathy. PET showed diffuse lymph node involvement in the neck, chest, abdomen and the pelvis,and patient was referred to oncology for further evaluation.  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 The patient initially received 1 cycle of R-CHOP which she tolerated fairly well with exception of hyponatremia and neutropenia.  Prior to her second planned cycle of chemotherapy, she was found to be positive for Myc and BCL-2 rearrangement.  She is now receiving  R-EPOCH and is here for cycle #2 of his  chemotherapy.  Today, the patient reports that she is feeling well overall.  She reports a very good appetite and has been eating a lot at home.  She reports that her breathing is stable and maybe even slightly improved.  Had an episode of dizziness and vomiting and was seen in the ER last week. Symptoms have now resolved. Today, she denies any dizziness, headaches, chest discomfort, cough.  Denies abdominal pain, nausea, vomiting, constipation, diarrhea.  Denies bleeding.  The patient is here for admission for cycle #2 of R-EPOCH.  INTERVAL HISTORY:   Denise Walls is a wonderful 58 y.o. female who is here for evaluation and management of DLBCL. The patient's last visit with [place] was on 02/15/2019. The pt reports that she is doing well overall.  The pt reports that she was not feeling well yesterday. She states that she ate well and walked a bit and feels better today. Pt notes some pain in her face that she thinks is related to her stress. Pt is stressed because she is unable to speak to her mother about her diagnosis and treatment and is worried because her mother lives by herself [place]. Pt notes that when she gets stressed her muscles tense. Pt thinks that she is gaining some weight but denies leg swelling. Pt tries to drink water, Gatorade and Pedialyte at night to keep her hydrated. There were two days were she was unable to eat well due to small bumps/sores on her tongue and sore gums. Pt has been using her salt/baking soda rinse but notes that the salt makes her feel like she needs to cough. She sometimes feels that her vision is worsening but is unsure. She also notes occasional headaches that she treats with pain medication as  needed.   Lab results today (03/07/19) of CBC w/diff and CMP is as follows: all values are WNL except for WBC at 33.2K, RBC at 2.98, Hgb at 8.9, HCT at 27.1, RDW at 21.9, nRBC at 0.4, Neutro Abs at 24.1K, Monocytes Abs at 3.2K, Abs  Immature Granulocytes at 4.83K, Ovalocytes are "PRESENT", Chloride at 96, Glucose at 111, Total Protein at 6.4, Alkaline Phosphatase at 156. 03/07/2019 Magnesium at 1.8  On review of systems, pt reports tense muscles, stress, pain around her face, tongue sores/bumps, sore gums, occasional headaches and denies leg swelling, unexpected weight loss, back pain and any other symptoms.    MEDICAL HISTORY:  Past Medical History:  Diagnosis Date   Acute cystitis    Anxiety    Arthritis    Crohn disease ([place])    dx 2004, history of small bowel obstruction 02/2007 treated conservatively   History of colon polyps    History of shingles    HTN (hypertension)    Hypercholesterolemia    Insomnia    Migraine    Uterine fibroid     SURGICAL HISTORY: Past Surgical History:  Procedure Laterality Date   COLONOSCOPY  09/11/2014   Small internal hemorrhoids. Otherwise normal colonoscopy to terminal ileum   COLONOSCOPY  10/02/2017   ESOPHAGOGASTRODUODENOSCOPY  04/05/2007   Mild gastritis. Otherwise, normal esophagogastroduodenoscopy   IR IMAGING GUIDED PORT INSERTION  11/29/2018   LYMPH NODE BIOPSY Left 12/09/2018   Procedure: LEFT DEEP CERVICAL LYMPH NODE BIOPSY;  Surgeon: Fanny Skates, MD;  Location: [facility];  Service: General;  Laterality: Left;   TUBAL LIGATION      SOCIAL HISTORY: Social History   Socioeconomic History   Marital status: Married    Spouse name: Not on file   Number of children: Not on file   Years of education: Not on file   Highest education level: Not on file  Occupational History   Not on file  Social Needs   Financial resource strain: Not on file   Food insecurity    Worry: Not on file    Inability: Not on file   Transportation needs    Medical: Not on file    Non-medical: Not on file  Tobacco Use   Smoking status: Never Smoker   Smokeless tobacco: Never Used  Substance and Sexual Activity   Alcohol use: Never    Frequency:  Never   Drug use: Never   Sexual activity: Yes    Birth control/protection: Post-menopausal  Lifestyle   Physical activity    Days per week: Not on file    Minutes per session: Not on file   Stress: Not on file  Relationships   Social connections    Talks on phone: Not on file    Gets together: Not on file    Attends religious service: Not on file    Active member of club or organization: Not on file    Attends meetings of clubs or organizations: Not on file    Relationship status: Not on file   Intimate partner violence    Fear of current or ex partner: Not on file    Emotionally abused: Not on file    Physically abused: Not on file    Forced sexual activity: Not on file  Other Topics Concern   Not on file  Social History Narrative   Not on file    FAMILY HISTORY: Family History  Problem Relation Age of Onset   Colon cancer Neg Hx  ALLERGIES:  is allergic to nsaids.  MEDICATIONS:  Current Outpatient Medications  Medication Sig Dispense Refill   B Complex Vitamins (B COMPLEX PO) Take 1 tablet by mouth daily.     calcium carbonate (TUMS - DOSED IN MG ELEMENTAL CALCIUM) 500 MG chewable tablet Chew 2 tablets by mouth daily as needed for indigestion or heartburn.     cholecalciferol (VITAMIN D3) 25 MCG (1000 UT) tablet Take 1 tablet (1,000 Units total) by mouth daily. 30 tablet 3   lidocaine-prilocaine (EMLA) cream Apply to affected area once (Patient taking differently: Apply 1 application topically as needed (For port-a-cath.). ) 30 g 3   meclizine (ANTIVERT) 25 MG tablet Take 1 tablet (25 mg total) by mouth 3 (three) times daily as needed for dizziness. (Patient not taking: Reported on 01/31/2019) 30 tablet 0   mesalamine (LIALDA) 1.2 g EC tablet Take 2.4 g by mouth daily.      ondansetron (ZOFRAN) 8 MG tablet Take 1 tablet (8 mg total) by mouth 2 (two) times daily as needed for refractory nausea / vomiting. Start on day 3 after cyclophosphamide  chemotherapy. (Patient not taking: Reported on 01/31/2019) 30 tablet 1   polyethylene glycol (MIRALAX / GLYCOLAX) 17 g packet Take 17 g by mouth daily as needed for mild constipation.     potassium chloride SA (K-DUR) 20 MEQ tablet Take 1 tablet (20 mEq total) by mouth daily. 30 tablet 1   Prenatal Vit-Fe Fumarate-FA (MULTIVITAMIN-PRENATAL) 27-0.8 MG TABS tablet Take 1 tablet by mouth daily at 12 noon.     traZODone (DESYREL) 50 MG tablet Take 1 tablet (50 mg total) by mouth at bedtime as needed for sleep. (Patient taking differently: Take 25 mg by mouth at bedtime as needed for sleep. ) 30 tablet 3   No current facility-administered medications for this visit.     REVIEW OF SYSTEMS:   A 10+ POINT REVIEW OF SYSTEMS WAS OBTAINED including neurology, dermatology, psychiatry, cardiac, respiratory, lymph, extremities, GI, GU, Musculoskeletal, constitutional, breasts, reproductive, HEENT.  All pertinent positives are noted in the HPI.  All others are negative.   PHYSICAL EXAMINATION: ECOG PERFORMANCE STATUS: 2 - Symptomatic, <50% confined to bed  . Vitals:   03/07/19 1436  BP: 124/73  Pulse: (!) 114  Resp: 18  Temp: 98.3 F (36.8 C)  SpO2: 100%   Filed Weights   03/07/19 1436  Weight: 134 lb 4.8 oz (60.9 kg)   .Body mass index is 27.13 kg/m.  GENERAL:alert, in no acute distress and comfortable SKIN: no acute rashes, no significant lesions EYES: conjunctiva are pink and non-injected, sclera anicteric OROPHARYNX: MMM, no exudates, no oropharyngeal erythema or ulceration NECK: supple, no JVD LYMPH:  no palpable lymphadenopathy in the cervical, axillary or inguinal regions LUNGS: clear to auscultation b/l with normal respiratory effort HEART: regular rate & rhythm ABDOMEN:  normoactive bowel sounds , non tender, not distended. No palpable hepatosplenomegaly.  Extremity: no pedal edema PSYCH: alert & oriented x 3 with fluent speech NEURO: no focal motor/sensory  deficits  LABORATORY DATA:  I have reviewed the data as listed  . CBC Latest Ref Rng & Units 03/07/2019 02/25/2019 02/24/2019  WBC 4.0 - 10.5 K/uL 33.2(H) 2.9(L) 3.7(L)  Hemoglobin 12.0 - 15.0 g/dL 8.9(L) 9.3(L) 9.1(L)  Hematocrit 36.0 - 46.0 % 27.1(L) 28.7(L) 28.7(L)  Platelets 150 - 400 K/uL 193 394 371    . CMP Latest Ref Rng & Units 03/07/2019 02/25/2019 02/24/2019  Glucose 70 - 99 mg/dL 111(H) 111(H) 130(H)  BUN 6 - 20 mg/dL 11 14 13   Creatinine 0.44 - 1.00 mg/dL 0.88 0.59 0.57  Sodium 135 - 145 mmol/L 135 134(L) 135  Potassium 3.5 - 5.1 mmol/L 4.2 3.4(L) 3.5  Chloride 98 - 111 mmol/L 96(L) 99 101  CO2 22 - 32 mmol/L 26 23 23   Calcium 8.9 - 10.3 mg/dL 9.1 8.9 8.6(L)  Total Protein 6.5 - 8.1 g/dL 6.4(L) 5.8(L) 5.6(L)  Total Bilirubin 0.3 - 1.2 mg/dL 0.3 0.4 0.6  Alkaline Phos 38 - 126 U/L 156(H) 73 72  AST 15 - 41 U/L 21 19 16   ALT 0 - 44 U/L 20 33 28     RADIOGRAPHIC STUDIES: I have personally reviewed the radiological images as listed and agreed with the findings in the report. 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 EXAM: NUCLEAR MEDICINE PET SKULL BASE TO THIGH TECHNIQUE: 6.6 mCi F-18 FDG was injected intravenously. Full-ring PET imaging was performed from the skull base to thigh after the radiotracer. CT data was obtained and used for attenuation correction and anatomic localization. Fasting blood glucose: 98 mg/dl COMPARISON:  11/10/2018 PET-CT. FINDINGS: Mediastinal blood pool activity: SUV max 1.9 Liver activity: SUV max 3.2 NECK: No new or residual hypermetabolic lymph nodes in the neck. Incidental CT findings: Right internal jugular Port-A-Cath terminates at the cavoatrial junction. CHEST: No new or residual enlarged or hypermetabolic axillary, mediastinal or hilar lymph nodes. Representative 0.7 cm left  pericardiophrenic node with max SUV 1.0 (series 4/image 91), previously 1.4 cm with max SUV 22.9. no hypermetabolic pulmonary findings. Incidental CT findings: Small left pleural effusion is decreased. No acute consolidative airspace disease, lung masses or significant pulmonary nodules. Atherosclerotic nonaneurysmal thoracic aorta. ABDOMEN/PELVIS: No new or residual hypermetabolic lymph nodes in the abdomen or pelvis. Previously visualized enlarged retroperitoneal, mesenteric and pelvic lymph nodes have all decreased in size. Representative 1.3 cm central mesenteric node with max SUV 1.6 (series 4/image 124), previously 3.6 cm with max SUV 16.5. There is new mild homogeneous hypermetabolism in the spleen with max SUV 4.7. Spleen is normal size. No focal splenic hypermetabolism. No abnormal hypermetabolic activity within the liver, pancreas or adrenal glands. Incidental CT findings: Atherosclerotic nonaneurysmal abdominal aorta. SKELETON: There is new diffuse hypermetabolism throughout axial and proximal appendicular skeleton with representative max SUV 9.1 in the L3 vertebral body. No focal skeletal hypermetabolism. Incidental CT findings: none IMPRESSION: 1. Findings favor complete metabolic response. No residual hypermetabolism within the lymph nodes of the neck, chest, abdomen or pelvis, which have all decreased in size in the interval. 2. Nonspecific new diffuse skeletal hypermetabolism and new mild splenic hypermetabolism, favor reactive state of the marrow and reticuloendothelial system. Spleen is normal size. Continued surveillance with CT or PET-CT advised. 3. Small layering left pleural effusion, decreased. 4.  Aortic Atherosclerosis (ICD10-I70.0). Electronically Signed   By: Ilona Sorrel M.D.   On: 02/14/2019 17:28   Dg Fluoro Guide Lumbar Puncture  Result Date: 02/23/2019 CLINICAL DATA:  58 year old female with large B-cell lymphoma. Initial lumbar puncture for CSF analysis and intrathecal  chemotherapy. EXAM: FLUOROSCOPICALLY GUIDED LUMBAR PUNCTURE INJECTION OF INTRATHECAL CHEMOTHERAPY FLUOROSCOPY TIME:  0 minutes 18 seconds PROCEDURE: Informed consent was obtained from the patient prior to the procedure with the help of an interpreter. Including potential complications of headache, allergy, and pain. A "time-out" was performed, and this was also checked against the  patient's chemotherapy from pharmacy. With the patient prone, the lower back was prepped with Betadine. 1% Lidocaine was used for local anesthesia. Lumbar puncture was performed at the L2-L3 level using left sub laminar and a 3.5 in x 20 gauge needle with return of clear, colorless CSF. First, CSF was collected in 4 test tubes totaling about 21 mL of fluid. Next, a 10 mL chemotherapy mixture of 12 milligrams methotrexate and 50 milligrams Solu-Cortef was injected into the subarachnoid space. The needle was withdrawn, direct pressure held and no bleeding encountered. The patient tolerated the procedure well and without apparent complication. Appropriate post procedural orders were placed on the chart. The patient was returned to the inpatient floor in stable condition for continued treatment. IMPRESSION: 1. Fluoroscopic guided lumbar puncture at L2-L3. 2. Collection of about 21 mL of CSF, sent to the lab for analysis. 3. Injection of intrathecal chemotherapy. Electronically Signed   By: Genevie Ann M.D.   On: 02/23/2019 16:49    ASSESSMENT & PLAN:   1. 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 The intent is for cure. -We discussed some of the risks,  benefitsandside-effects of Rituximab,Etoposide, Vincristine, Adriamycin,Cytoxan,Prednisone.The regimen requires inpatient admission due to continuous chemotherapy infusion  -Some of the short term side-effects included, though not limited to, risk of fatigue, weight loss,tumor lysis syndrome, risk of allergic reactions,pancytopenia, life-threatening infections, need for transfusions of blood products, nausea, vomiting, change in bowel habits,hairloss, risk of congestive heart failure, admission to hospital for various reasons, and risks of death.  -Long term side-effects are also discussed includingpermanent damage to nerve function, chronic fatigue, and rare secondary malignancy including bone marrow disorders. -The patient is aware that the response rates discussed earlier is not guaranteed. -After a long discussion, patient made an informed decision to proceed with the prescribed plan of care. -She has antiemetics available to her  02/14/2019 PET Scan Whole Body ([id number]) revealed "1. Findings favor complete metabolic response. No residual hypermetabolism within the lymph nodes of the neck, chest, abdomen or pelvis, which have all decreased in size in the interval. 2. Nonspecific new diffuse skeletal hypermetabolism and new mild splenic hypermetabolism, favor reactive state of the marrow and reticuloendothelial system. Spleen is normal size. Continued surveillance with CT or PET-CT advised. 3. Small layering left pleural effusion, decreased. 4.  Aortic Atherosclerosis (ICD10-I70.0)."   PLAN:  -Discussed pt labwork today, 03/07/19; all values are WNL except for WBC at 33.2K, RBC at 2.98, Hgb at 8.9, HCT at 27.1, RDW at 21.9, nRBC at 0.4, Neutro Abs at 24.1K, Monocytes Abs at 3.2K, Abs Immature Granulocytes at 4.83K, Ovalocytes are "PRESENT", Chloride at 96, Glucose at 111, Total Protein at 6.4, Alkaline Phosphatase at 156. -Discussed 03/07/2019 Magnesium at 1.8 -Recommended that the  pt continue to eat well, drink at least 48-64 oz of water each day, and walk 20-30 minutes each day.  -Advised pt that it is okay for her to increase her sodium intake  -Recommended sugar in clarified butter or olive oil for mouth dryness -Continue B-complex vitamin or multivitamin daily  -Plan for 4 treatments of intrathecal chemotherapy, no issues from 1st treatment -The pt has no prohibitive toxicities  from continuing C5 of R-EPOCH at this time -Plan for 6 cycles of R-EPOCH -Will see back in 3 weeks with labs  2. 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 Normocytic anemia -Secondary to anemia of chronic disease  -Hgb is 9.3 today -Patient denies any symptoms of bleeding -We will monitor it for now   4. Leukocytosis -related to G-CSF -no symptoms suggestive of infection.  5. Hyponatremia -[id number] today -Continue Pedialyte and gentle salt addition to the diet, and maintain adequate hydration -We will monitor it closely  6. Hypokalemia -K3.6 today -Has been on potassium chloride 20 mEq daily as an outpatient in the past. Will hold off on ordering for now. Monitor K+ daily.   7. Chemotherapy-associated nausea  -Secondary to chemotherapy -Symptoms relatively well controlled  -Continue PRN-anti-emetics   FOLLOW UP: -Inpatient C5 EPOCH from 03/14/2019 for 5 days -Outpatient Rituxan and Neulasta on 03/21/2019 -f/u with Dr Irene Limbo with labs on 03/28/2019  The total time spent in the appt was 25 minutes and more than 50% was on counseling and direct patient cares.  All of the patient's questions were answered with apparent satisfaction. The patient knows to call the clinic with any problems, questions or concerns.    Sullivan Lone MD [place] AAHIVMS [facility]  [facility] Hematology/Oncology Physician [facility]  (Office):       [phone] (Work cell):  [phone] (Fax):           [phone]  03/07/2019 5:37 PM  I, Yevette Edwards, am acting as a scribe for Dr. Sullivan Lone.   .I have reviewed the above documentation for accuracy and completeness, and I agree with the above. [name] MD

## 2019-03-08 ENCOUNTER — Telehealth: Payer: Self-pay | Admitting: *Deleted

## 2019-03-08 ENCOUNTER — Telehealth: Payer: Self-pay | Admitting: Hematology

## 2019-03-08 NOTE — Telephone Encounter (Signed)
Scheduled appt per 10/19 los.  Per the patient request call the daughter.  Called the daughter to inform her of her mothers appts.  She is aware of the appt date and time.

## 2019-03-08 NOTE — Telephone Encounter (Signed)
Contacted bed placement, spoke with Dawn and gave information r/t admission on 10/26 for inpatient chemo. They will contact patient. Patient was been instructed vis interpreter while in office to go to [facility] testing site for covid test on  Friday 10/23. Patient stated understanding and is in agreement.

## 2019-03-11 ENCOUNTER — Other Ambulatory Visit (HOSPITAL_COMMUNITY)
Admission: RE | Admit: 2019-03-11 | Discharge: 2019-03-11 | Disposition: A | Discharge status: Home / Self Care | Payer: Commercial Managed Care - PPO | Source: Ambulatory Visit | Attending: Hematology | Admitting: Hematology

## 2019-03-11 DIAGNOSIS — Z01812 Encounter for preprocedural laboratory examination: Secondary | ICD-10-CM | POA: Insufficient documentation

## 2019-03-11 DIAGNOSIS — Z20828 Contact with and (suspected) exposure to other viral communicable diseases: Secondary | ICD-10-CM | POA: Insufficient documentation

## 2019-03-14 ENCOUNTER — Inpatient Hospital Stay (HOSPITAL_COMMUNITY)
Admission: RE | Admit: 2019-03-14 | Discharge: 2019-03-18 | DRG: 847 | Disposition: A | Discharge status: Home / Self Care | Payer: Commercial Managed Care - PPO | Source: Ambulatory Visit | Attending: Hematology | Admitting: Hematology

## 2019-03-14 ENCOUNTER — Other Ambulatory Visit: Payer: Self-pay

## 2019-03-14 ENCOUNTER — Encounter (HOSPITAL_COMMUNITY): Payer: Self-pay | Admitting: Oncology

## 2019-03-14 DIAGNOSIS — D649 Anemia, unspecified: Secondary | ICD-10-CM | POA: Diagnosis not present

## 2019-03-14 DIAGNOSIS — E876 Hypokalemia: Secondary | ICD-10-CM | POA: Diagnosis present

## 2019-03-14 DIAGNOSIS — Z20828 Contact with and (suspected) exposure to other viral communicable diseases: Secondary | ICD-10-CM | POA: Diagnosis present

## 2019-03-14 DIAGNOSIS — C8330 Diffuse large B-cell lymphoma, unspecified site: Secondary | ICD-10-CM | POA: Diagnosis present

## 2019-03-14 DIAGNOSIS — E785 Hyperlipidemia, unspecified: Secondary | ICD-10-CM | POA: Diagnosis present

## 2019-03-14 DIAGNOSIS — K5090 Crohn's disease, unspecified, without complications: Secondary | ICD-10-CM | POA: Diagnosis present

## 2019-03-14 DIAGNOSIS — I10 Essential (primary) hypertension: Secondary | ICD-10-CM | POA: Diagnosis present

## 2019-03-14 DIAGNOSIS — Z86010 Personal history of colonic polyps: Secondary | ICD-10-CM

## 2019-03-14 DIAGNOSIS — G43909 Migraine, unspecified, not intractable, without status migrainosus: Secondary | ICD-10-CM | POA: Diagnosis present

## 2019-03-14 DIAGNOSIS — E7800 Pure hypercholesterolemia, unspecified: Secondary | ICD-10-CM | POA: Diagnosis present

## 2019-03-14 DIAGNOSIS — Z888 Allergy status to other drugs, medicaments and biological substances status: Secondary | ICD-10-CM | POA: Diagnosis not present

## 2019-03-14 DIAGNOSIS — C8339 Diffuse large B-cell lymphoma, extranodal and solid organ sites: Secondary | ICD-10-CM

## 2019-03-14 DIAGNOSIS — D630 Anemia in neoplastic disease: Secondary | ICD-10-CM | POA: Diagnosis present

## 2019-03-14 DIAGNOSIS — F419 Anxiety disorder, unspecified: Secondary | ICD-10-CM | POA: Diagnosis present

## 2019-03-14 DIAGNOSIS — G4700 Insomnia, unspecified: Secondary | ICD-10-CM | POA: Diagnosis present

## 2019-03-14 DIAGNOSIS — Z5111 Encounter for antineoplastic chemotherapy: Principal | ICD-10-CM

## 2019-03-14 DIAGNOSIS — C8510 Unspecified B-cell lymphoma, unspecified site: Secondary | ICD-10-CM | POA: Diagnosis present

## 2019-03-14 DIAGNOSIS — E871 Hypo-osmolality and hyponatremia: Secondary | ICD-10-CM | POA: Diagnosis present

## 2019-03-14 DIAGNOSIS — T451X5A Adverse effect of antineoplastic and immunosuppressive drugs, initial encounter: Secondary | ICD-10-CM | POA: Diagnosis present

## 2019-03-14 DIAGNOSIS — J910 Malignant pleural effusion: Secondary | ICD-10-CM | POA: Diagnosis present

## 2019-03-14 LAB — COMPREHENSIVE METABOLIC PANEL
ALT: 13 U/L (ref 0–44)
ALT: 21 U/L (ref 0–44)
AST: 11 U/L — ABNORMAL LOW (ref 15–41)
AST: 19 U/L (ref 15–41)
Albumin: 2.1 g/dL — ABNORMAL LOW (ref 3.5–5.0)
Albumin: 3.5 g/dL (ref 3.5–5.0)
Alkaline Phosphatase: 46 U/L (ref 38–126)
Alkaline Phosphatase: 76 U/L (ref 38–126)
Anion gap: 5 (ref 5–15)
Anion gap: 10 (ref 5–15)
BUN: 9 mg/dL (ref 6–20)
BUN: 14 mg/dL (ref 6–20)
CO2: 13 mmol/L — ABNORMAL LOW (ref 22–32)
CO2: 24 mmol/L (ref 22–32)
Calcium: 5 mg/dL — CL (ref 8.9–10.3)
Calcium: 8.6 mg/dL — ABNORMAL LOW (ref 8.9–10.3)
Chloride: 123 mmol/L — ABNORMAL HIGH (ref 98–111)
Chloride: 100 mmol/L (ref 98–111)
Creatinine, Ser: 0.3 mg/dL — ABNORMAL LOW (ref 0.44–1.00)
Creatinine, Ser: 0.61 mg/dL (ref 0.44–1.00)
GFR calc Af Amer: >60 mL/min (ref >60)
GFR calc Af Amer: >60 mL/min (ref >60)
GFR calc non Af Amer: >60 mL/min (ref >60)
GFR calc non Af Amer: >60 mL/min (ref >60)
Glucose, Bld: 55 mg/dL — ABNORMAL LOW (ref 70–99)
Glucose, Bld: 159 mg/dL — ABNORMAL HIGH (ref 70–99)
Potassium: <2 mmol/L — CL (ref 3.5–5.1)
Potassium: 3.4 mmol/L — ABNORMAL LOW (ref 3.5–5.1)
Sodium: 141 mmol/L (ref 135–145)
Sodium: 134 mmol/L — ABNORMAL LOW (ref 135–145)
Total Bilirubin: 0.1 mg/dL — ABNORMAL LOW (ref 0.3–1.2)
Total Bilirubin: 0.3 mg/dL (ref 0.3–1.2)
Total Protein: 3.5 g/dL — ABNORMAL LOW (ref 6.5–8.1)
Total Protein: 6 g/dL — ABNORMAL LOW (ref 6.5–8.1)

## 2019-03-14 LAB — CBC WITH DIFFERENTIAL/PLATELET
Abs Immature Granulocytes: 0.07 10*3/uL (ref 0.00–0.07)
Basophils Absolute: 0.1 10*3/uL (ref 0.0–0.1)
Basophils Relative: 2 %
Eosinophils Absolute: 0 10*3/uL (ref 0.0–0.5)
Eosinophils Relative: 1 %
HCT: 29.1 % — ABNORMAL LOW (ref 36.0–46.0)
Hemoglobin: 9.1 g/dL — ABNORMAL LOW (ref 12.0–15.0)
Immature Granulocytes: 1 %
Lymphocytes Relative: 10 %
Lymphs Abs: 0.6 10*3/uL — ABNORMAL LOW (ref 0.7–4.0)
MCH: 29.1 pg (ref 26.0–34.0)
MCHC: 31.3 g/dL (ref 30.0–36.0)
MCV: 93 fL (ref 80.0–100.0)
Monocytes Absolute: 0.7 10*3/uL (ref 0.1–1.0)
Monocytes Relative: 11 %
Neutro Abs: 4.7 10*3/uL (ref 1.7–7.7)
Neutrophils Relative %: 75 %
Platelets: 382 10*3/uL (ref 150–400)
RBC: 3.13 MIL/uL — ABNORMAL LOW (ref 3.87–5.11)
RDW: 20.4 % — ABNORMAL HIGH (ref 11.5–15.5)
WBC: 6.2 10*3/uL (ref 4.0–10.5)
nRBC: 0 % (ref 0.0–0.2)

## 2019-03-14 LAB — NOVEL CORONAVIRUS, NAA (HOSP ORDER, SEND-OUT TO REF LAB; TAT 18-24 HRS): SARS-CoV-2, NAA: NOT DETECTED

## 2019-03-14 MED ORDER — CALCIUM CARBONATE ANTACID 500 MG PO CHEW: 2.0000 | CHEWABLE_TABLET | Freq: Every day | ORAL | Status: DC | PRN

## 2019-03-14 MED ORDER — MECLIZINE HCL 25 MG PO TABS: 25.0000 mg | ORAL_TABLET | Freq: Three times a day (TID) | ORAL | Status: DC | PRN

## 2019-03-14 MED ORDER — HOT PACK MISC ONCOLOGY
1.0000 | Freq: Once | Status: DC | PRN
  Filled 2019-03-14: qty 1

## 2019-03-14 MED ORDER — PRENATAL MULTIVITAMIN CH
1.0000 | ORAL_TABLET | Freq: Every day | ORAL | Status: DC
  Administered 2019-03-15 – 2019-03-18 (×4): 1 via ORAL
  Filled 2019-03-14 (×4): qty 1

## 2019-03-14 MED ORDER — SODIUM CHLORIDE 0.9 % IV SOLN
INTRAVENOUS | Status: DC
  Administered 2019-03-14 – 2019-03-17 (×2): via INTRAVENOUS

## 2019-03-14 MED ORDER — MESALAMINE 1.2 G PO TBEC
2.4000 g | DELAYED_RELEASE_TABLET | Freq: Every day | ORAL | Status: DC
  Administered 2019-03-15 – 2019-03-18 (×4): 2.4 g via ORAL
  Filled 2019-03-14 (×6): qty 2

## 2019-03-14 MED ORDER — CALCIUM CARBONATE ANTACID 500 MG PO CHEW: 2.0000 | CHEWABLE_TABLET | Freq: Two times a day (BID) | ORAL | Status: DC

## 2019-03-14 MED ORDER — COLD PACK MISC ONCOLOGY
1.0000 | Freq: Once | Status: DC | PRN
  Filled 2019-03-14: qty 1

## 2019-03-14 MED ORDER — B COMPLEX PO TABS: 1.0000 | ORAL_TABLET | Freq: Every day | ORAL | Status: DC

## 2019-03-14 MED ORDER — TRAZODONE HCL 50 MG PO TABS: 25.0000 mg | ORAL_TABLET | Freq: Every evening | ORAL | Status: DC | PRN

## 2019-03-14 MED ORDER — SODIUM CHLORIDE 0.9% FLUSH
10.0000 mL | Freq: Two times a day (BID) | INTRAVENOUS | Status: DC
  Administered 2019-03-14 – 2019-03-18 (×5): 10 mL

## 2019-03-14 MED ORDER — ONDANSETRON HCL 8 MG PO TABS: 8.0000 mg | ORAL_TABLET | Freq: Two times a day (BID) | ORAL | Status: DC | PRN

## 2019-03-14 MED ORDER — SODIUM CHLORIDE 0.9% FLUSH: 10.0000 mL | INTRAVENOUS | Status: DC | PRN

## 2019-03-14 MED ORDER — POTASSIUM CHLORIDE CRYS ER 20 MEQ PO TBCR: 20.0000 meq | EXTENDED_RELEASE_TABLET | Freq: Every day | ORAL | Status: DC

## 2019-03-14 MED ORDER — ENOXAPARIN SODIUM 40 MG/0.4ML ~~LOC~~ SOLN
40.0000 mg | SUBCUTANEOUS | Status: DC
  Administered 2019-03-14 – 2019-03-17 (×3): 40 mg via SUBCUTANEOUS
  Filled 2019-03-14 (×3): qty 0.4

## 2019-03-14 MED ORDER — VINCRISTINE SULFATE CHEMO INJECTION 1 MG/ML
Freq: Once | INTRAVENOUS | Status: AC
  Administered 2019-03-14: 18:00:00 via INTRAVENOUS
  Filled 2019-03-14: qty 8

## 2019-03-14 MED ORDER — POLYETHYLENE GLYCOL 3350 17 G PO PACK: 17.0000 g | PACK | Freq: Every day | ORAL | Status: DC | PRN

## 2019-03-14 MED ORDER — LIDOCAINE-PRILOCAINE 2.5-2.5 % EX CREA: 1.0000 "application " | TOPICAL_CREAM | CUTANEOUS | Status: DC | PRN

## 2019-03-14 MED ORDER — SODIUM CHLORIDE 0.9 % IV SOLN
Freq: Once | INTRAVENOUS | Status: AC
  Administered 2019-03-14: 8 mg via INTRAVENOUS
  Filled 2019-03-14: qty 4

## 2019-03-14 MED ORDER — CALCIUM CARBONATE ANTACID 500 MG PO CHEW: 2.0000 | CHEWABLE_TABLET | Freq: Two times a day (BID) | ORAL | Status: DC | PRN

## 2019-03-14 MED ORDER — CHLORHEXIDINE GLUCONATE CLOTH 2 % EX PADS
6.0000 | MEDICATED_PAD | Freq: Every day | CUTANEOUS | Status: DC
  Administered 2019-03-15 – 2019-03-18 (×4): 6 via TOPICAL

## 2019-03-14 MED ORDER — B COMPLEX-C PO TABS
1.0000 | ORAL_TABLET | Freq: Every day | ORAL | Status: DC
  Administered 2019-03-15 – 2019-03-18 (×4): 1 via ORAL
  Filled 2019-03-14 (×4): qty 1

## 2019-03-14 MED ORDER — POTASSIUM CHLORIDE CRYS ER 20 MEQ PO TBCR
20.0000 meq | EXTENDED_RELEASE_TABLET | Freq: Two times a day (BID) | ORAL | Status: DC
  Administered 2019-03-14 – 2019-03-18 (×9): 20 meq via ORAL
  Filled 2019-03-14 (×9): qty 1

## 2019-03-14 MED ORDER — PREDNISONE 20 MG PO TABS
60.0000 mg | ORAL_TABLET | Freq: Every day | ORAL | Status: AC
  Administered 2019-03-14 – 2019-03-18 (×5): 60 mg via ORAL
  Filled 2019-03-14 (×5): qty 3

## 2019-03-14 MED ORDER — VITAMIN D 25 MCG (1000 UNIT) PO TABS
1000.0000 [IU] | ORAL_TABLET | Freq: Every day | ORAL | Status: DC
  Administered 2019-03-15 – 2019-03-18 (×4): 1000 [IU] via ORAL
  Filled 2019-03-14 (×4): qty 1

## 2019-03-14 NOTE — Progress Notes (Signed)
Chemotherapy dosages for Doxorubicin, Etoposide, and Vincristine independently checked with Nancy Marus, RN based on normal dosing and patient's BSA.

## 2019-03-14 NOTE — H&P (Addendum)
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 The patient reports that she has history of Crohn's disease, for which she has been taking mesalamine with very good control of her symptoms. She was previously followed by Dr. Kathy Breach gastroenterology and to transition her care to a different gastroenterologist after Dr.Guptamoved to [facility]. Her new gastroenterologist stopped her mesalamine some time in 2019,and soon after that, she began to develop new onset left lower quadrant discomfort/pain,nonradiating, dull, intermittent, mild to moderate in intensity, exacerbated by eating and improved with defecation. She underwent colonoscopy, which was unremarkable, and was recommended to modify her diet without any improvement. She then re-established her care with Dr. Etter Sjogren ordered CT abdomen/pelvis that showed diffuse abdominal lymphadenopathy. PET showed diffuse lymph node involvement in the neck, chest, abdomen and the pelvis,and patient was referred to oncology for further evaluation.  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 The patient initially received 1 cycle of R-CHOP which she tolerated fairly well with exception of hyponatremia and neutropenia.  Prior to her second planned cycle of chemotherapy, she was found to be positive for Myc and BCL-2 rearrangement.  She is now receiving  R-EPOCH and is here for cycle #4 of this chemotherapy.   Today, the patient reports that she is feeling well overall.  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  Appetite  has been good.  Denies mucositis.  Denies headaches and dizziness.  Reports stable breathing and no cough.  Denies chest pain.  Denies abdominal pain, nausea, vomiting, constipation, diarrhea.  Denies bleeding.  The patient is here for admission for cycle #4 of R-EPOCH.  COVID-19 testing negative.       Past Medical History:  Diagnosis Date  . Acute cystitis   . Anxiety   . Arthritis   . Crohn disease ([place])    dx 2004, history of small bowel obstruction 02/2007 treated conservatively  . History of colon polyps   . History of shingles   . HTN (hypertension)   . Hypercholesterolemia   . Insomnia   . Migraine   . Uterine fibroid   :   Past Surgical History:  Procedure Laterality Date  . COLONOSCOPY  09/11/2014   Small internal hemorrhoids. Otherwise normal colonoscopy to terminal ileum  . COLONOSCOPY  10/02/2017  . ESOPHAGOGASTRODUODENOSCOPY  04/05/2007   Mild gastritis. Otherwise, normal esophagogastroduodenoscopy  . IR IMAGING GUIDED PORT INSERTION  11/29/2018  . LYMPH NODE BIOPSY Left 12/09/2018   Procedure: LEFT DEEP CERVICAL LYMPH NODE BIOPSY;  Surgeon: Fanny Skates, MD;  Location: [place];  Service: General;  Laterality: Left;  . TUBAL LIGATION    :   Current Facility-Administered Medications  Medication Dose Route Frequency Provider Last Rate Last Dose  . Chlorhexidine Gluconate Cloth 2 % PADS 6 each  6 each Topical Daily Curcio, Kristin R, NP      . enoxaparin (LOVENOX) injection 40 mg  40 mg Subcutaneous Q24H Curcio, Kristin R, NP      . sodium chloride flush (NS) 0.9 % injection 10-40 mL  10-40 mL Intracatheter Q12H Curcio, Roselie Awkward, NP      . sodium  chloride flush (NS) 0.9 % injection 10-40 mL  10-40 mL Intracatheter PRN Curcio, Roselie Awkward, NP          Allergies  Allergen Reactions  . Nsaids Anaphylaxis and Other (See Comments)    Abdominal pain, GI Bleeding   :   Family History  Problem Relation Age of Onset  . Colon cancer Neg Hx   :   Social  History   Socioeconomic History  . Marital status: Married    Spouse name: Not on file  . Number of children: Not on file  . Years of education: Not on file  . Highest education level: Not on file  Occupational History  . Not on file  Social Needs  . Financial resource strain: Not on file  . Food insecurity    Worry: Not on file    Inability: Not on file  . Transportation needs    Medical: Not on file    Non-medical: Not on file  Tobacco Use  . Smoking status: Never Smoker  . Smokeless tobacco: Never Used  Substance and Sexual Activity  . Alcohol use: Never    Frequency: Never  . Drug use: Never  . Sexual activity: Yes    Birth control/protection: Post-menopausal  Lifestyle  . Physical activity    Days per week: Not on file    Minutes per session: Not on file  . Stress: Not on file  Relationships  . Social Herbalist on phone: Not on file    Gets together: Not on file    Attends religious service: Not on file    Active member of club or organization: Not on file    Attends meetings of clubs or organizations: Not on file    Relationship status: Not on file  . Intimate partner violence    Fear of current or ex partner: Not on file    Emotionally abused: Not on file    Physically abused: Not on file    Forced sexual activity: Not on file  Other Topics Concern  . Not on file  Social History Narrative  . Not on file  :  Review of Systems: A comprehensive 14 point review of systems was negative except as noted in the HPI.  Exam: .BP 127/75 (BP Location: Left Arm)   Pulse 73   Temp 98.4 F (36.9 C) (Oral)   Resp 17   Ht 4' 11"  (1.499 m)   Wt 130 lb 11.7 oz (59.3 kg)   LMP  (LMP Unknown)   SpO2 99%   BMI 26.40 kg/m    General:  well-nourished in no acute distress.   Eyes:  no scleral icterus.   ENT:  There were no oropharyngeal lesions.   Neck was without thyromegaly.   Lymphatics:  Negative cervical, supraclavicular or axillary adenopathy.    Respiratory: lungs were clear bilaterally without wheezing or crackles.   Cardiovascular:  Regular rate and rhythm, S1/S2, without murmur, rub or gallop.  There was no pedal edema.   GI:  abdomen was soft, flat, nontender, nondistended, without organomegaly.   Musculoskeletal:  no spinal tenderness of palpation of vertebral spine.   Skin exam was without echymosis, petichae.   Neuro exam was nonfocal. Patient was alert and oriented.  Attention was good.   Language was appropriate.  Mood was normal without depression.  Speech was not pressured.  Thought content was not tangential.     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 EXAM: NUCLEAR MEDICINE PET SKULL BASE TO THIGH TECHNIQUE: 6.6 mCi F-18 FDG was injected intravenously. Full-ring PET imaging was performed from the skull base to thigh after the radiotracer. CT data was obtained and used for attenuation correction and anatomic localization. Fasting blood glucose: 98 mg/dl COMPARISON:  11/10/2018 PET-CT. FINDINGS: Mediastinal blood pool activity: SUV max 1.9 Liver activity: SUV max 3.2 NECK: No new or residual hypermetabolic lymph nodes in the neck. Incidental CT findings: Right internal jugular Port-A-Cath terminates at the cavoatrial junction. CHEST: No new or residual enlarged or hypermetabolic axillary, mediastinal or hilar lymph nodes. Representative 0.7 cm left pericardiophrenic node with max SUV 1.0 (series 4/image 91), previously 1.4 cm with  max SUV 50.5. no hypermetabolic pulmonary findings. Incidental CT findings: Small left pleural effusion is decreased. No acute consolidative airspace disease, lung masses or significant pulmonary nodules. Atherosclerotic nonaneurysmal thoracic aorta. ABDOMEN/PELVIS: No new or residual hypermetabolic lymph nodes in the abdomen or pelvis. Previously visualized enlarged retroperitoneal, mesenteric and pelvic lymph nodes have all decreased in size. Representative 1.3 cm central mesenteric node with max SUV 1.6 (series 4/image 124), previously 3.6 cm with max SUV 16.5. There is new mild homogeneous hypermetabolism in the spleen with max SUV 4.7. Spleen is normal size. No focal splenic hypermetabolism. No abnormal hypermetabolic activity within the liver, pancreas or adrenal glands. Incidental CT findings: Atherosclerotic nonaneurysmal abdominal aorta. SKELETON: There is new diffuse hypermetabolism throughout axial and proximal appendicular skeleton with representative max SUV 9.1 in the L3 vertebral body. No focal skeletal hypermetabolism. Incidental CT findings: none IMPRESSION: 1. Findings favor complete metabolic response. No residual hypermetabolism within the lymph nodes of the neck, chest, abdomen or pelvis, which have all decreased in size in the interval. 2. Nonspecific new diffuse skeletal hypermetabolism and new mild splenic hypermetabolism, favor reactive state of the marrow and reticuloendothelial system. Spleen is normal size. Continued surveillance with CT or PET-CT advised. 3. Small layering left pleural effusion, decreased. 4.  Aortic Atherosclerosis (ICD10-I70.0). Electronically Signed   By: Ilona Sorrel M.D.   On: 02/14/2019 17:28   Dg Fluoro Guide Lumbar Puncture  Result Date: 02/23/2019 CLINICAL DATA:  58 year old female with large B-cell lymphoma. Initial lumbar puncture for CSF analysis and intrathecal chemotherapy. EXAM: FLUOROSCOPICALLY GUIDED LUMBAR PUNCTURE INJECTION OF INTRATHECAL  CHEMOTHERAPY FLUOROSCOPY TIME:  0 minutes 18 seconds PROCEDURE: Informed consent was obtained from the patient prior to the procedure with the help of an interpreter. Including potential complications of headache, allergy, and pain. A "time-out" was performed, and this was also checked against the patient's chemotherapy from pharmacy. With the patient prone, the lower back was prepped with Betadine. 1% Lidocaine was used for local anesthesia. Lumbar puncture was performed at the L2-L3 level using left sub laminar and a 3.5 in x 20 gauge needle with return of clear, colorless CSF. First, CSF was collected in 4 test tubes totaling about 21 mL of fluid. Next,  a 10 mL chemotherapy mixture of 12 milligrams methotrexate and 50 milligrams Solu-Cortef was injected into the subarachnoid space. The needle was withdrawn, direct pressure held and no bleeding encountered. The patient tolerated the procedure well and without apparent complication. Appropriate post procedural orders were placed on the chart. The patient was returned to the inpatient floor in stable condition for continued treatment. IMPRESSION: 1. Fluoroscopic guided lumbar puncture at L2-L3. 2. Collection of about 21 mL of CSF, sent to the lab for analysis. 3. Injection of intrathecal chemotherapy. Electronically Signed   By: Genevie Ann M.D.   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 EXAM: NUCLEAR MEDICINE PET SKULL BASE TO THIGH TECHNIQUE: 6.6 mCi F-18 FDG was injected intravenously. Full-ring PET imaging was performed from the skull base to thigh after the radiotracer. CT data was obtained and used for attenuation correction and anatomic localization. Fasting blood glucose: 98 mg/dl COMPARISON:  11/10/2018 PET-CT. FINDINGS: Mediastinal blood pool activity: SUV max 1.9 Liver  activity: SUV max 3.2 NECK: No new or residual hypermetabolic lymph nodes in the neck. Incidental CT findings: Right internal jugular Port-A-Cath terminates at the cavoatrial junction. CHEST: No new or residual enlarged or hypermetabolic axillary, mediastinal or hilar lymph nodes. Representative 0.7 cm left pericardiophrenic node with max SUV 1.0 (series 4/image 91), previously 1.4 cm with max SUV 25.0. no hypermetabolic pulmonary findings. Incidental CT findings: Small left pleural effusion is decreased. No acute consolidative airspace disease, lung masses or significant pulmonary nodules. Atherosclerotic nonaneurysmal thoracic aorta. ABDOMEN/PELVIS: No new or residual hypermetabolic lymph nodes in the abdomen or pelvis. Previously visualized enlarged retroperitoneal, mesenteric and pelvic lymph nodes have all decreased in size. Representative 1.3 cm central mesenteric node with max SUV 1.6 (series 4/image 124), previously 3.6 cm with max SUV 16.5. There is new mild homogeneous hypermetabolism in the spleen with max SUV 4.7. Spleen is normal size. No focal splenic hypermetabolism. No abnormal hypermetabolic activity within the liver, pancreas or adrenal glands. Incidental CT findings: Atherosclerotic nonaneurysmal abdominal aorta. SKELETON: There is new diffuse hypermetabolism throughout axial and proximal appendicular skeleton with representative max SUV 9.1 in the L3 vertebral body. No focal skeletal hypermetabolism. Incidental CT findings: none IMPRESSION: 1. Findings favor complete metabolic response. No residual hypermetabolism within the lymph nodes of the neck, chest, abdomen or pelvis, which have all decreased in size in the interval. 2. Nonspecific new diffuse skeletal hypermetabolism and new mild splenic hypermetabolism, favor reactive state of the marrow and reticuloendothelial system. Spleen is normal size. Continued surveillance with CT or PET-CT advised. 3. Small layering left pleural effusion,  decreased. 4.  Aortic Atherosclerosis (ICD10-I70.0). Electronically Signed   By: Ilona Sorrel M.D.   On: 02/14/2019 17:28   Dg Fluoro Guide Lumbar Puncture  Result Date: 02/23/2019 CLINICAL DATA:  58 year old female with large B-cell lymphoma. Initial lumbar puncture for CSF analysis and intrathecal chemotherapy. EXAM: FLUOROSCOPICALLY GUIDED LUMBAR PUNCTURE INJECTION OF INTRATHECAL CHEMOTHERAPY FLUOROSCOPY TIME:  0 minutes 18 seconds PROCEDURE: Informed consent was obtained from the patient prior to the procedure with the help of an interpreter. Including potential complications of headache, allergy, and pain. A "time-out" was performed, and this was also checked against the patient's chemotherapy from pharmacy. With the patient prone, the lower back was prepped with Betadine. 1% Lidocaine was used for local anesthesia. Lumbar puncture was  performed at the L2-L3 level using left sub laminar and a 3.5 in x 20 gauge needle with return of clear, colorless CSF. First, CSF was collected in 4 test tubes totaling about 21 mL of fluid. Next, a 10 mL chemotherapy mixture of 12 milligrams methotrexate and 50 milligrams Solu-Cortef was injected into the subarachnoid space. The needle was withdrawn, direct pressure held and no bleeding encountered. The patient tolerated the procedure well and without apparent complication. Appropriate post procedural orders were placed on the chart. The patient was returned to the inpatient floor in stable condition for continued treatment. IMPRESSION: 1. Fluoroscopic guided lumbar puncture at L2-L3. 2. Collection of about 21 mL of CSF, sent to the lab for analysis. 3. Injection of intrathecal chemotherapy. Electronically Signed   By: Genevie Ann M.D.   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 The intent is for cure. -We discussed some of the risks, benefitsandside-effects of Rituximab,Etoposide, Vincristine, Adriamycin,Cytoxan,Prednisone.The regimen requires inpatient admission due to continuous chemotherapy infusion  -Some of the short term side-effects included, though not limited to, risk of fatigue, weight loss,tumor lysis syndrome, risk of allergic reactions,pancytopenia, life-threatening infections, need for transfusions of blood products, nausea, vomiting, change in bowel habits,hairloss, risk of congestive heart failure, admission to hospital for various reasons, and risks of death.  -Long term side-effects are also discussed includingpermanent damage to nerve function, chronic fatigue, and rare secondary malignancy including bone marrow disorders. -The patient is aware that the response rates discussed earlier is not guaranteed. -After a long discussion, patient made an informed decision to proceed with the prescribed plan of care. -She will proceed with cycle #4 of EPOCH today as scheduled.  -She has antiemetics available to her -CBC from today is adequate for treatment. CMET pending.  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 -WBC is normal today -We will monitor it for now  Hyponatremia -Napending today -Continue Pedialyte and gentle salt addition to the diet, and maintain adequate hydration -We will monitor it closely  Hypokalemia -K+ pending today -Will continue potassium chloride 20 mEq daily for now pending lab results.   Chemotherapy-associated nausea  -Secondary to chemotherapy -Symptoms relatively well controlled  -Continue PRN-anti-emetics  We will plan for Rituxan and Neulasta on 03/21/2019.  Follow-up visit with Dr. Irene Limbo with lab work on 03/28/2019.  Denise Bussing, Denise Walls, Denise Walls, Denise Walls    ADDENDUM   .Patient was Personally and independently interviewed, examined and relevant elements of the history of present illness were reviewed in details and an assessment and plan was created. All elements of the patient's history  of present illness , assessment and plan were discussed in details with Denise Bussing, Denise Walls. The above documentation reflects our combined findings assessment and plan.  No new clinical concerns Labs reviewed  Component     Latest Ref Rng & Units 03/14/2019  WBC     4.0 - 10.5 K/uL 6.2  RBC     3.87 - 5.11 MIL/uL 3.13 (L)  Hemoglobin     12.0 - 15.0 g/dL 9.1 (L)  HCT     36.0 - 46.0 % 29.1 (L)  MCV     80.0 - 100.0 fL 93.0  MCH     26.0 - 34.0 pg 29.1  MCHC     30.0 - 36.0 g/dL 31.3  RDW     11.5 - 15.5 % 20.4 (H)  Platelets     150 - 400 K/uL 382  nRBC     0.0 - 0.2 % 0.0  Neutrophils     % 75  NEUT#     1.7 - 7.7 K/uL 4.7  Lymphocytes     % 10  Lymphocyte #     0.7 - 4.0 K/uL 0.6 (L)  Monocytes Relative     % 11  Monocyte #     0.1 - 1.0 K/uL 0.7  Eosinophil     % 1  Eosinophils Absolute     0.0 - 0.5 K/uL 0.0  Basophil     % 2  Basophils Absolute     0.0 - 0.1 K/uL 0.1  Immature Granulocytes     % 1  Abs Immature Granulocytes     0.00 - 0.07 K/uL 0.07   Sodium     135 - 145 mmol/L 134 (L)  Potassium     3.5 - 5.1 mmol/L 3.4 (L)  Chloride     98 - 111 mmol/L 100  CO2     22 - 32 mmol/L 24  Glucose     70 - 99 mg/dL 159 (H)  BUN     6 - 20 mg/dL 14  Creatinine     0.44 - 1.00 mg/dL 0.61  Calcium     8.9 - 10.3 mg/dL 8.6 (L)  Total Protein     6.5 - 8.1 g/dL 6.0 (L)  Albumin     3.5 - 5.0 g/dL 3.5  AST     15 - 41 U/L 19  ALT     0 - 44 U/L 21  Alkaline Phosphatase     38 - 126 U/L 76  Total Bilirubin     0.3 - 1.2 mg/dL 0.3  GFR, Est Non African American     >60 mL/min >60  GFR, Est African American     >60 mL/min >60  Anion gap     5 - 15 10    Chemotherapy orders placed, reviewed and signed. Will keep same dose levels on her [facility] Rituxan and neulasta on 11/2 as outpatient. IT MTX tomorrow - IR procedure request placed.  Sullivan Lone MD MS

## 2019-03-14 NOTE — Progress Notes (Signed)
CRITICAL VALUE ALERT  Critical Value:  Potassium <2.0; Calcium 5.0  Date & Time Notied:  03/14/2019  1215  Provider Notified: Mikey Bussing, NP  Orders Received/Actions taken: Orders to redraw STAT CMET

## 2019-03-15 ENCOUNTER — Inpatient Hospital Stay (HOSPITAL_COMMUNITY): Payer: Commercial Managed Care - PPO

## 2019-03-15 DIAGNOSIS — Z5111 Encounter for antineoplastic chemotherapy: Secondary | ICD-10-CM | POA: Diagnosis not present

## 2019-03-15 DIAGNOSIS — C8330 Diffuse large B-cell lymphoma, unspecified site: Secondary | ICD-10-CM | POA: Diagnosis not present

## 2019-03-15 LAB — COMPREHENSIVE METABOLIC PANEL
ALT: 23 U/L (ref 0–44)
AST: 19 U/L (ref 15–41)
Albumin: 3.4 g/dL — ABNORMAL LOW (ref 3.5–5.0)
Alkaline Phosphatase: 81 U/L (ref 38–126)
Anion gap: 8 (ref 5–15)
BUN: 11 mg/dL (ref 6–20)
CO2: 23 mmol/L (ref 22–32)
Calcium: 9.2 mg/dL (ref 8.9–10.3)
Chloride: 104 mmol/L (ref 98–111)
Creatinine, Ser: 0.54 mg/dL (ref 0.44–1.00)
GFR calc Af Amer: >60 mL/min (ref >60)
GFR calc non Af Amer: >60 mL/min (ref >60)
Glucose, Bld: 137 mg/dL — ABNORMAL HIGH (ref 70–99)
Potassium: 3.9 mmol/L (ref 3.5–5.1)
Sodium: 135 mmol/L (ref 135–145)
Total Bilirubin: 0.1 mg/dL — ABNORMAL LOW (ref 0.3–1.2)
Total Protein: 6.2 g/dL — ABNORMAL LOW (ref 6.5–8.1)

## 2019-03-15 LAB — CBC
HCT: 29 % — ABNORMAL LOW (ref 36.0–46.0)
Hemoglobin: 9.1 g/dL — ABNORMAL LOW (ref 12.0–15.0)
MCH: 29.1 pg (ref 26.0–34.0)
MCHC: 31.4 g/dL (ref 30.0–36.0)
MCV: 92.7 fL (ref 80.0–100.0)
Platelets: 394 10*3/uL (ref 150–400)
RBC: 3.13 MIL/uL — ABNORMAL LOW (ref 3.87–5.11)
RDW: 19.9 % — ABNORMAL HIGH (ref 11.5–15.5)
WBC: 5.2 10*3/uL (ref 4.0–10.5)
nRBC: 0 % (ref 0.0–0.2)

## 2019-03-15 MED ORDER — SODIUM CHLORIDE (PF) 0.9 % IJ SOLN
Freq: Once | INTRAMUSCULAR | Status: AC
  Administered 2019-03-15: 15:00:00 via INTRATHECAL
  Filled 2019-03-15: qty 0.48

## 2019-03-15 MED ORDER — VINCRISTINE SULFATE CHEMO INJECTION 1 MG/ML
Freq: Once | INTRAVENOUS | Status: AC
  Administered 2019-03-15: 17:00:00 via INTRAVENOUS
  Filled 2019-03-15: qty 8

## 2019-03-15 MED ORDER — SODIUM CHLORIDE 0.9 % IV SOLN
Freq: Once | INTRAVENOUS | Status: AC
  Administered 2019-03-15: 18 mg via INTRAVENOUS
  Filled 2019-03-15: qty 4

## 2019-03-15 NOTE — Progress Notes (Addendum)
HEMATOLOGY-ONCOLOGY PROGRESS NOTE  SUBJECTIVE: Tolerating her chemotherapy well overall.  Denies mucositis.  Denies chest pain and shortness of breath.  Denies abdominal pain, nausea, vomiting.  Bowels are moving without any difficulty.  Denies lower extremity edema.  Due for intrathecal methotrexate today.  Oncology History  DLBCL (diffuse large B cell lymphoma) ([place])  10/29/2018 Imaging   CT abdomen/pelvis: IMPRESSION: Abdominopelvic lymphadenopathy, including a dominant 3.2 cm short axis jejunal mesentery nodal mass encasing the SMA, partially necrotic. Overall appearance favors lymphoma, although nodal metastases are also possible.   Spleen is normal in size. However, hypoenhancing lesions are suspected on the portal venous phase, raising the possibility of lymphomatous involvement.   Suspected subcarinal nodal mass, incompletely evaluated. Consider CT chest or PET-CT for further evaluation.   Moderate left pleural effusion. Associated left lower lobe opacity, likely atelectasis.   11/10/2018 Imaging   PET: IMPRESSION: 1. 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 2. Nodal metastasis include the lower neck, mediastinum, mesentery, peritoneum and retroperitoneum, and iliac lymph nodes. 3. Spleen and bone marrow normal. 4. Moderate LEFT pleural effusion. 5. Target lymph nodes for sampling could include the LEFT external iliac lymph node, precordial lymph node in the LEFT upper quadrant, or super clavicular/LEFT sub pectoralis nodes.   12/03/2018 Bone Marrow Biopsy   Bone Marrow, Aspirate,Biopsy, and Clot, left posterior iliac crest - NORMOCELLULAR MARROW WITH FOCAL SMALL LYMPHOID AGGREGATES. - SEE COMMENT. PERIPHERAL BLOOD: - BORDERLINE NORMOCYTIC ANEMIA. Diagnosis Note The marrow has only a few small lymphoid aggregates which are not overtly atypical and contain a mixture of B-cells and T-cells. Flow cytometry is negative for a  monoclonal B-cell population. These findings are not diagnostic of a lymphoproliferative process.  Accession: [id number] Bone Marrow Flow Cytometry - NO MONOCLONAL B-CELL OR PHENOTYPICALLY ABERRANT T-CELL POPULATION IDENTIFIED.   12/03/2018 Imaging   TTE:  1. The left ventricle has normal systolic function with an ejection fraction of 60-65%. The cavity size was normal. Left ventricular diastolic Doppler parameters are consistent with impaired relaxation.  2. The right ventricle has normal systolic function. The cavity was normal. There is no increase in right ventricular wall thickness.  3. Large pleural effusion.  4. Clinical correlation suggested.  5. The mitral valve is grossly normal.  6. The tricuspid valve is grossly normal.  7. The aortic valve is grossly normal. Aortic valve regurgitation was not assessed by color flow Doppler.  8. The aortic root is normal in size and structure.   12/08/2018 Procedure   Left thoracentesis   12/08/2018 Pathology Results   Diagnosis PLEURAL FLUID, LEFT (SPECIMEN 1 OF 1 COLLECTED 12/08/18): - B-CELL LYMPHOMA - SEE COMMENT   12/09/2018 Procedure   Left supraclavicular LN bx    12/09/2018 Pathology Results   Accession: [id number] Lymph node for lymphoma, left deep cervical - DIFFUSE LARGE B-CELL LYMPHOMA - SEE COMMENT   12/21/2018 - 01/10/2019 Chemotherapy   The patient had DOXOrubicin (ADRIAMYCIN) chemo injection 80 mg, 50 mg/m2 = 80 mg, Intravenous,  Once, 1 of 6 cycles Administration: 80 mg (12/21/2018) palonosetron (ALOXI) injection 0.25 mg, 0.25 mg, Intravenous,  Once, 1 of 6 cycles Administration: 0.25 mg (12/21/2018) pegfilgrastim-cbqv (UDENYCA) injection 6 mg, 6 mg, Subcutaneous, Once, 1 of 6 cycles Administration: 6 mg (12/23/2018) vinCRIStine (ONCOVIN) 2 mg in sodium chloride 0.9 % 50 mL chemo infusion, 2 mg, Intravenous,  Once, 1 of 6 cycles Administration: 2 mg (12/21/2018) riTUXimab (RITUXAN) 600 mg in sodium chloride 0.9 % 250 mL (1.9355  mg/mL) infusion, 375 mg/m2 =  600 mg, Intravenous,  Once, 1 of 1 cycle Administration: 600 mg (12/21/2018) cyclophosphamide (CYTOXAN) 1,200 mg in sodium chloride 0.9 % 250 mL chemo infusion, 750 mg/m2 = 1,200 mg, Intravenous,  Once, 1 of 6 cycles Administration: 1,200 mg (12/21/2018) riTUXimab (RITUXAN) 600 mg in sodium chloride 0.9 % 190 mL infusion, 375 mg/m2 = 600 mg (100 % of original dose 375 mg/m2), Intravenous,  Once, 0 of 5 cycles Dose modification: 375 mg/m2 (original dose 375 mg/m2, Cycle 2, Reason: Provider Judgment, Comment: VO to change to RIR) fosaprepitant (EMEND) 150 mg, dexamethasone (DECADRON) 12 mg in sodium chloride 0.9 % 145 mL IVPB, , Intravenous,  Once, 1 of 6 cycles Administration:  (12/21/2018)  for chemotherapy treatment.    01/10/2019 -  Chemotherapy   The patient had DOXOrubicin (ADRIAMYCIN) 16 mg, etoposide (VEPESID) 80 mg, vinCRIStine (ONCOVIN) 0.6 mg in sodium chloride 0.9 % 1,000 mL chemo infusion, , Intravenous, Once, 4 of 5 cycles Administration:  (01/10/2019),  (01/11/2019),  (01/31/2019),  (02/01/2019),  (01/12/2019),  (01/13/2019),  (02/02/2019),  (02/03/2019),  (02/21/2019),  (02/22/2019),  (02/23/2019),  (02/24/2019),  (03/14/2019) ondansetron (ZOFRAN) 8 mg, dexamethasone (DECADRON) 10 mg in sodium chloride 0.9 % 50 mL IVPB, , Intravenous,  Once, 4 of 5 cycles Administration: 18 mg (01/10/2019), 8 mg (01/11/2019), 36 mg (01/14/2019), 8 mg (01/31/2019), 18 mg (02/01/2019), 36 mg (02/04/2019), 18 mg (01/12/2019), 18 mg (01/13/2019), 18 mg (02/02/2019), 18 mg (02/03/2019), 18 mg (02/21/2019), 8 mg (02/22/2019), 36 mg (02/25/2019), 8 mg (02/23/2019), 8 mg (02/24/2019), 8 mg (03/14/2019) methotrexate (PF) 12 mg, hydrocortisone sodium succinate (SOLU-CORTEF) 50 mg in sodium chloride (PF) 0.9 % INTRATHECAL chemo injection, , Intrathecal,  Once, 2 of 3 cycles Administration:  (02/23/2019) cyclophosphamide (CYTOXAN) 1,180 mg in sodium chloride 0.9 % 250 mL chemo infusion, 750 mg/m2 = 1,180 mg,  Intravenous,  Once, 4 of 5 cycles Administration: 1,180 mg (01/14/2019), 1,180 mg (02/04/2019), 1,180 mg (02/25/2019)  for chemotherapy treatment.    01/17/2019 -  Chemotherapy   The patient had pegfilgrastim-cbqv (UDENYCA) injection 6 mg, 6 mg, Subcutaneous, Once, 3 of 6 cycles Administration: 6 mg (01/17/2019), 6 mg (02/07/2019), 6 mg (02/28/2019) riTUXimab (RITUXAN) 600 mg in sodium chloride 0.9 % 250 mL (1.9355 mg/mL) infusion, 375 mg/m2 = 600 mg, Intravenous,  Once, 3 of 3 cycles Administration: 600 mg (02/07/2019) riTUXimab (RITUXAN) 600 mg in sodium chloride 0.9 % 190 mL infusion, 375 mg/m2 = 600 mg, Intravenous,  Once, 1 of 4 cycles Administration: 600 mg (02/28/2019)  for chemotherapy treatment.       REVIEW OF SYSTEMS:   10 Point review of Systems was done is negative except as noted above.   I have reviewed the past medical history, past surgical history, social history and family history with the patient and they are unchanged from previous note.   PHYSICAL EXAMINATION: ECOG PERFORMANCE STATUS: 0 - Asymptomatic  Vitals:   03/14/19 2112 03/15/19 0630  BP: 127/75 132/75  Pulse: 73 84  Resp: 17 17  Temp: 98.4 F (36.9 C) 98.1 F (36.7 C)  SpO2: 99% 97%   Filed Weights   03/14/19 1219  Weight: 130 lb 11.7 oz (59.3 kg)    Intake/Output from previous day: 10/26 0701 - 10/27 0700 In: 4599 [P.O.:360; I.V.:194; IV Piggyback:537] Out: 800 [Urine:800]  . GENERAL:alert, in no acute distress and comfortable SKIN: no acute rashes, no significant lesions EYES: conjunctiva are pink and non-injected, sclera anicteric OROPHARYNX: MMM, no exudates, no oropharyngeal erythema or ulceration NECK: supple, no JVD LYMPH:  no palpable lymphadenopathy in the cervical, axillary or inguinal regions LUNGS: clear to auscultation b/l with normal respiratory effort HEART: regular rate & rhythm ABDOMEN:  normoactive bowel sounds , non tender, not distended. Extremity: no pedal  edema PSYCH: alert & oriented x 3 with fluent speech NEURO: no focal motor/sensory deficits   LABORATORY DATA:  I have reviewed the data as listed  CMP Latest Ref Rng & Units 03/15/2019 03/14/2019 03/14/2019  Glucose 70 - 99 mg/dL 137(H) 159(H) 55(L)  BUN 6 - 20 mg/dL _0 Creatinine 0.44 - 1.00 mg/dL 0.54 0.61 0.30(L)  Sodium 135 - 145 mmol/L 135 134(L) 141  Potassium 3.5 - 5.1 mmol/L 3.9 3.4(L) <2.0(LL)  Chloride 98 - 111 mmol/L 104 100 123(H)  CO2 22 - 32 mmol/L 23 24 13(L)  Calcium 8.9 - 10.3 mg/dL 9.2 8.6(L) 5.0(LL)  Total Protein 6.5 - 8.1 g/dL 6.2(L) 6.0(L) 3.5(L)  Total Bilirubin 0.3 - 1.2 mg/dL 0.1(L) 0.3 0.1(L)  Alkaline Phos 38 - 126 U/L 81 76 46  AST 15 - 41 U/L 19 19 11(L)  ALT 0 - 44 U/L _1 Lab Results  Component Value Date   WBC 5.2 03/15/2019   HGB 9.1 (L) 03/15/2019   HCT 29.0 (L) 03/15/2019   MCV 92.7 03/15/2019   PLT 394 03/15/2019   NEUTROABS 4.7 03/14/2019   . CMP Latest Ref Rng & Units 03/15/2019 03/14/2019 03/14/2019  Glucose 70 - 99 mg/dL 137(H) 159(H) 55(L)  BUN 6 - 20 mg/dL _2 Creatinine 0.44 - 1.00 mg/dL 0.54 0.61 0.30(L)  Sodium 135 - 145 mmol/L 135 134(L) 141  Potassium 3.5 - 5.1 mmol/L 3.9 3.4(L) <2.0(LL)  Chloride 98 - 111 mmol/L 104 100 123(H)  CO2 22 - 32 mmol/L 23 24 13(L)  Calcium 8.9 - 10.3 mg/dL 9.2 8.6(L) 5.0(LL)  Total Protein 6.5 - 8.1 g/dL 6.2(L) 6.0(L) 3.5(L)  Total Bilirubin 0.3 - 1.2 mg/dL 0.1(L) 0.3 0.1(L)  Alkaline Phos 38 - 126 U/L 81 76 46  AST 15 - 41 U/L 19 19 11(L)  ALT 0 - 44 U/L _3 Nm Pet Image Restag (ps) Skull Base To Thigh  Result Date: 02/14/2019 CLINICAL DATA:  Subsequent treatment strategy for 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 EXAM: NUCLEAR MEDICINE PET SKULL BASE TO THIGH TECHNIQUE: 6.6 mCi F-18 FDG was injected intravenously. Full-ring PET imaging was performed from the skull base to thigh after the radiotracer. CT data was  obtained and used for attenuation correction and anatomic localization. Fasting blood glucose: 98 mg/dl COMPARISON:  11/10/2018 PET-CT. FINDINGS: Mediastinal blood pool activity: SUV max 1.9 Liver activity: SUV max 3.2 NECK: No new or residual hypermetabolic lymph nodes in the neck. Incidental CT findings: Right internal jugular Port-A-Cath terminates at the cavoatrial junction. CHEST: No new or residual enlarged or hypermetabolic axillary, mediastinal or hilar lymph nodes. Representative 0.7 cm left pericardiophrenic node with max SUV 1.0 (series 4/image 91), previously 1.4 cm with max SUV 81.1. no hypermetabolic pulmonary findings. Incidental CT findings: Small left pleural effusion is decreased. No acute consolidative airspace disease, lung masses or significant pulmonary nodules. Atherosclerotic nonaneurysmal thoracic aorta. ABDOMEN/PELVIS: No new or residual hypermetabolic lymph nodes in the abdomen or pelvis. Previously visualized enlarged retroperitoneal, mesenteric and pelvic lymph nodes have all decreased in size. Representative 1.3 cm central mesenteric node with max SUV 1.6 (series 4/image 124), previously 3.6 cm with max SUV 16.5. There  is new mild homogeneous hypermetabolism in the spleen with max SUV 4.7. Spleen is normal size. No focal splenic hypermetabolism. No abnormal hypermetabolic activity within the liver, pancreas or adrenal glands. Incidental CT findings: Atherosclerotic nonaneurysmal abdominal aorta. SKELETON: There is new diffuse hypermetabolism throughout axial and proximal appendicular skeleton with representative max SUV 9.1 in the L3 vertebral body. No focal skeletal hypermetabolism. Incidental CT findings: none IMPRESSION: 1. Findings favor complete metabolic response. No residual hypermetabolism within the lymph nodes of the neck, chest, abdomen or pelvis, which have all decreased in size in the interval. 2. Nonspecific new diffuse skeletal hypermetabolism and new mild splenic  hypermetabolism, favor reactive state of the marrow and reticuloendothelial system. Spleen is normal size. Continued surveillance with CT or PET-CT advised. 3. Small layering left pleural effusion, decreased. 4.  Aortic Atherosclerosis (ICD10-I70.0). Electronically Signed   By: Ilona Sorrel M.D.   On: 02/14/2019 17:28   Dg Fluoro Guide Lumbar Puncture  Result Date: 02/23/2019 CLINICAL DATA:  58 year old female with large B-cell lymphoma. Initial lumbar puncture for CSF analysis and intrathecal chemotherapy. EXAM: FLUOROSCOPICALLY GUIDED LUMBAR PUNCTURE INJECTION OF INTRATHECAL CHEMOTHERAPY FLUOROSCOPY TIME:  0 minutes 18 seconds PROCEDURE: Informed consent was obtained from the patient prior to the procedure with the help of an interpreter. Including potential complications of headache, allergy, and pain. A "time-out" was performed, and this was also checked against the patient's chemotherapy from pharmacy. With the patient prone, the lower back was prepped with Betadine. 1% Lidocaine was used for local anesthesia. Lumbar puncture was performed at the L2-L3 level using left sub laminar and a 3.5 in x 20 gauge needle with return of clear, colorless CSF. First, CSF was collected in 4 test tubes totaling about 21 mL of fluid. Next, a 10 mL chemotherapy mixture of 12 milligrams methotrexate and 50 milligrams Solu-Cortef was injected into the subarachnoid space. The needle was withdrawn, direct pressure held and no bleeding encountered. The patient tolerated the procedure well and without apparent complication. Appropriate post procedural orders were placed on the chart. The patient was returned to the inpatient floor in stable condition for continued treatment. IMPRESSION: 1. Fluoroscopic guided lumbar puncture at L2-L3. 2. Collection of about 21 mL of CSF, sent to the lab for analysis. 3. Injection of intrathecal chemotherapy. Electronically Signed   By: Genevie Ann M.D.   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 The intent is for cure. -We discussed some of the risks, benefitsandside-effects of Rituximab,Etoposide, Vincristine, Adriamycin,Cytoxan,Prednisone.The regimen requires inpatient admission due to continuous chemotherapy infusion  -Some of the short term side-effects included, though not limited to, risk of fatigue, weight loss,tumor lysis syndrome, risk of allergic reactions,pancytopenia, life-threatening infections, need for transfusions of blood products, nausea, vomiting, change in bowel habits,hairloss, risk of congestive heart failure, admission to hospital for various reasons, and risks of death.  -Long term side-effects are also discussed includingpermanent damage to nerve function, chronic fatigue, and rare secondary malignancy including bone marrow disorders. -The patient is aware that the response rates discussed earlier is not guaranteed. -After a long discussion, patient made an informed decision to  proceed with the prescribed plan of care. -She will proceed with day 2 of cycle #4of EPOCH today as scheduled.  -She has antiemetics available to her -Labs from today have been reviewed and are adequate for treatment.  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 -WBC is normal today -We will monitor it for now  Hyponatremia -Na is normal today -Continue Pedialyte and gentle salt addition to the diet, and maintain adequate hydration -We will monitor it closely  Hypokalemia -Potassium was 3.9 today -Will continue potassium chloride 20 mEq daily.  Chemotherapy-associated nausea  -Secondary to chemotherapy -Symptoms relatively well controlled  -Continue PRN-anti-emetics  We will plan for Rituxan and Neulasta on 03/21/2019.  Follow-up visit with Dr. Irene Limbo with lab work on 03/28/2019.   LOS: 1 day   Mikey Bussing MD MS   ADDENDUM  .Patient was Personally and independently interviewed, examined and relevant elements of the history of present illness were reviewed in details and an assessment and plan was created. All elements of the patient's history of present illness , assessment and plan were discussed in details with Mikey Bussing DNP. The above documentation reflects our combined findings assessment and plan.  Sullivan Lone MD MS

## 2019-03-15 NOTE — Procedures (Signed)
L4-5 interspace was accessed for administration of intrathecal chemotherapy without complication.  Images saved in PACS.  See final dictation for details.

## 2019-03-16 DIAGNOSIS — Z5111 Encounter for antineoplastic chemotherapy: Secondary | ICD-10-CM | POA: Diagnosis not present

## 2019-03-16 DIAGNOSIS — C8330 Diffuse large B-cell lymphoma, unspecified site: Secondary | ICD-10-CM | POA: Diagnosis not present

## 2019-03-16 LAB — CBC
HCT: 27.3 % — ABNORMAL LOW (ref 36.0–46.0)
Hemoglobin: 8.6 g/dL — ABNORMAL LOW (ref 12.0–15.0)
MCH: 29.3 pg (ref 26.0–34.0)
MCHC: 31.5 g/dL (ref 30.0–36.0)
MCV: 92.9 fL (ref 80.0–100.0)
Platelets: 392 10*3/uL (ref 150–400)
RBC: 2.94 MIL/uL — ABNORMAL LOW (ref 3.87–5.11)
RDW: 20.1 % — ABNORMAL HIGH (ref 11.5–15.5)
WBC: 7.9 10*3/uL (ref 4.0–10.5)
nRBC: 0 % (ref 0.0–0.2)

## 2019-03-16 LAB — COMPREHENSIVE METABOLIC PANEL
ALT: 22 U/L (ref 0–44)
AST: 20 U/L (ref 15–41)
Albumin: 3.1 g/dL — ABNORMAL LOW (ref 3.5–5.0)
Alkaline Phosphatase: 70 U/L (ref 38–126)
Anion gap: 9 (ref 5–15)
BUN: 13 mg/dL (ref 6–20)
CO2: 22 mmol/L (ref 22–32)
Calcium: 8.8 mg/dL — ABNORMAL LOW (ref 8.9–10.3)
Chloride: 105 mmol/L (ref 98–111)
Creatinine, Ser: 0.59 mg/dL (ref 0.44–1.00)
GFR calc Af Amer: >60 mL/min (ref >60)
GFR calc non Af Amer: >60 mL/min (ref >60)
Glucose, Bld: 119 mg/dL — ABNORMAL HIGH (ref 70–99)
Potassium: 3.7 mmol/L (ref 3.5–5.1)
Sodium: 136 mmol/L (ref 135–145)
Total Bilirubin: 0.3 mg/dL (ref 0.3–1.2)
Total Protein: 5.6 g/dL — ABNORMAL LOW (ref 6.5–8.1)

## 2019-03-16 MED ORDER — SODIUM CHLORIDE 0.9 % IV SOLN
Freq: Once | INTRAVENOUS | Status: AC
  Administered 2019-03-16: 8 mg via INTRAVENOUS
  Filled 2019-03-16: qty 4

## 2019-03-16 MED ORDER — VINCRISTINE SULFATE CHEMO INJECTION 1 MG/ML
Freq: Once | INTRAVENOUS | Status: AC
  Administered 2019-03-16: 16:00:00 via INTRAVENOUS
  Filled 2019-03-16: qty 8

## 2019-03-16 NOTE — Progress Notes (Signed)
Chemotherapy dosage and calculations verified.

## 2019-03-16 NOTE — Progress Notes (Addendum)
HEMATOLOGY-ONCOLOGY PROGRESS NOTE  SUBJECTIVE: Continues to tolerate her treatment well.  Intrathecal methotrexate by interventional radiology yesterday.  She tolerated this procedure well.  Denies headaches and visual changes.  Denies back pain.  Denies mucositis, chest pain, shortness of breath, cough.  Denies abdominal pain, nausea, vomiting.  Bowels moved yesterday.   Oncology History  DLBCL (diffuse large B cell lymphoma) ([place])  10/29/2018 Imaging   CT abdomen/pelvis: IMPRESSION: Abdominopelvic lymphadenopathy, including a dominant 3.2 cm short axis jejunal mesentery nodal mass encasing the SMA, partially necrotic. Overall appearance favors lymphoma, although nodal metastases are also possible.   Spleen is normal in size. However, hypoenhancing lesions are suspected on the portal venous phase, raising the possibility of lymphomatous involvement.   Suspected subcarinal nodal mass, incompletely evaluated. Consider CT chest or PET-CT for further evaluation.   Moderate left pleural effusion. Associated left lower lobe opacity, likely atelectasis.   11/10/2018 Imaging   PET: IMPRESSION: 1. 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 2. Nodal metastasis include the lower neck, mediastinum, mesentery, peritoneum and retroperitoneum, and iliac lymph nodes. 3. Spleen and bone marrow normal. 4. Moderate LEFT pleural effusion. 5. Target lymph nodes for sampling could include the LEFT external iliac lymph node, precordial lymph node in the LEFT upper quadrant, or super clavicular/LEFT sub pectoralis nodes.   12/03/2018 Bone Marrow Biopsy   Bone Marrow, Aspirate,Biopsy, and Clot, left posterior iliac crest - NORMOCELLULAR MARROW WITH FOCAL SMALL LYMPHOID AGGREGATES. - SEE COMMENT. PERIPHERAL BLOOD: - BORDERLINE NORMOCYTIC ANEMIA. Diagnosis Note The marrow has only a few small lymphoid aggregates which are not overtly atypical and contain a  mixture of B-cells and T-cells. Flow cytometry is negative for a monoclonal B-cell population. These findings are not diagnostic of a lymphoproliferative process.  Accession: [id number] Bone Marrow Flow Cytometry - NO MONOCLONAL B-CELL OR PHENOTYPICALLY ABERRANT T-CELL POPULATION IDENTIFIED.   12/03/2018 Imaging   TTE:  1. The left ventricle has normal systolic function with an ejection fraction of 60-65%. The cavity size was normal. Left ventricular diastolic Doppler parameters are consistent with impaired relaxation.  2. The right ventricle has normal systolic function. The cavity was normal. There is no increase in right ventricular wall thickness.  3. Large pleural effusion.  4. Clinical correlation suggested.  5. The mitral valve is grossly normal.  6. The tricuspid valve is grossly normal.  7. The aortic valve is grossly normal. Aortic valve regurgitation was not assessed by color flow Doppler.  8. The aortic root is normal in size and structure.   12/08/2018 Procedure   Left thoracentesis   12/08/2018 Pathology Results   Diagnosis PLEURAL FLUID, LEFT (SPECIMEN 1 OF 1 COLLECTED 12/08/18): - B-CELL LYMPHOMA - SEE COMMENT   12/09/2018 Procedure   Left supraclavicular LN bx    12/09/2018 Pathology Results   Accession: [id number] Lymph node for lymphoma, left deep cervical - DIFFUSE LARGE B-CELL LYMPHOMA - SEE COMMENT   12/21/2018 - 01/10/2019 Chemotherapy   The patient had DOXOrubicin (ADRIAMYCIN) chemo injection 80 mg, 50 mg/m2 = 80 mg, Intravenous,  Once, 1 of 6 cycles Administration: 80 mg (12/21/2018) palonosetron (ALOXI) injection 0.25 mg, 0.25 mg, Intravenous,  Once, 1 of 6 cycles Administration: 0.25 mg (12/21/2018) pegfilgrastim-cbqv (UDENYCA) injection 6 mg, 6 mg, Subcutaneous, Once, 1 of 6 cycles Administration: 6 mg (12/23/2018) vinCRIStine (ONCOVIN) 2 mg in sodium chloride 0.9 % 50 mL chemo infusion, 2 mg, Intravenous,  Once, 1 of 6 cycles Administration: 2 mg  (12/21/2018) riTUXimab (RITUXAN) 600 mg in sodium chloride  0.9 % 250 mL (1.9355 mg/mL) infusion, 375 mg/m2 = 600 mg, Intravenous,  Once, 1 of 1 cycle Administration: 600 mg (12/21/2018) cyclophosphamide (CYTOXAN) 1,200 mg in sodium chloride 0.9 % 250 mL chemo infusion, 750 mg/m2 = 1,200 mg, Intravenous,  Once, 1 of 6 cycles Administration: 1,200 mg (12/21/2018) riTUXimab (RITUXAN) 600 mg in sodium chloride 0.9 % 190 mL infusion, 375 mg/m2 = 600 mg (100 % of original dose 375 mg/m2), Intravenous,  Once, 0 of 5 cycles Dose modification: 375 mg/m2 (original dose 375 mg/m2, Cycle 2, Reason: Provider Judgment, Comment: VO to change to RIR) fosaprepitant (EMEND) 150 mg, dexamethasone (DECADRON) 12 mg in sodium chloride 0.9 % 145 mL IVPB, , Intravenous,  Once, 1 of 6 cycles Administration:  (12/21/2018)  for chemotherapy treatment.    01/10/2019 -  Chemotherapy   The patient had DOXOrubicin (ADRIAMYCIN) 16 mg, etoposide (VEPESID) 80 mg, vinCRIStine (ONCOVIN) 0.6 mg in sodium chloride 0.9 % 1,000 mL chemo infusion, , Intravenous, Once, 4 of 5 cycles Administration:  (01/10/2019),  (01/11/2019),  (01/31/2019),  (02/01/2019),  (01/12/2019),  (01/13/2019),  (02/02/2019),  (02/03/2019),  (02/21/2019),  (02/22/2019),  (02/23/2019),  (02/24/2019),  (03/14/2019),  (03/15/2019) ondansetron (ZOFRAN) 8 mg, dexamethasone (DECADRON) 10 mg in sodium chloride 0.9 % 50 mL IVPB, , Intravenous,  Once, 4 of 5 cycles Administration: 18 mg (01/10/2019), 8 mg (01/11/2019), 36 mg (01/14/2019), 8 mg (01/31/2019), 18 mg (02/01/2019), 36 mg (02/04/2019), 18 mg (01/12/2019), 18 mg (01/13/2019), 18 mg (02/02/2019), 18 mg (02/03/2019), 18 mg (02/21/2019), 8 mg (02/22/2019), 36 mg (02/25/2019), 8 mg (02/23/2019), 8 mg (02/24/2019), 8 mg (03/14/2019), 18 mg (03/15/2019) methotrexate (PF) 12 mg, hydrocortisone sodium succinate (SOLU-CORTEF) 50 mg in sodium chloride (PF) 0.9 % INTRATHECAL chemo injection, , Intrathecal,  Once, 2 of 3 cycles Administration:  (02/23/2019),   (03/15/2019) cyclophosphamide (CYTOXAN) 1,180 mg in sodium chloride 0.9 % 250 mL chemo infusion, 750 mg/m2 = 1,180 mg, Intravenous,  Once, 4 of 5 cycles Administration: 1,180 mg (01/14/2019), 1,180 mg (02/04/2019), 1,180 mg (02/25/2019)  for chemotherapy treatment.    01/17/2019 -  Chemotherapy   The patient had pegfilgrastim-cbqv (UDENYCA) injection 6 mg, 6 mg, Subcutaneous, Once, 3 of 6 cycles Administration: 6 mg (01/17/2019), 6 mg (02/07/2019), 6 mg (02/28/2019) riTUXimab (RITUXAN) 600 mg in sodium chloride 0.9 % 250 mL (1.9355 mg/mL) infusion, 375 mg/m2 = 600 mg, Intravenous,  Once, 3 of 3 cycles Administration: 600 mg (02/07/2019) riTUXimab (RITUXAN) 600 mg in sodium chloride 0.9 % 190 mL infusion, 375 mg/m2 = 600 mg, Intravenous,  Once, 1 of 4 cycles Administration: 600 mg (02/28/2019)  for chemotherapy treatment.       REVIEW OF SYSTEMS:   10 Point review of Systems was done is negative except as noted above.   I have reviewed the past medical history, past surgical history, social history and family history with the patient and they are unchanged from previous note.   PHYSICAL EXAMINATION: ECOG PERFORMANCE STATUS: 0 - Asymptomatic  Vitals:   03/15/19 2000 03/16/19 0552  BP: (!) 141/80 130/73  Pulse: 66 81  Resp: 17 16  Temp: 98.7 F (37.1 C) 98.3 F (36.8 C)  SpO2: 100% 98%   Filed Weights   03/14/19 1219  Weight: 130 lb 11.7 oz (59.3 kg)    Intake/Output from previous day: 10/27 0701 - 10/28 0700 In: 1578.1 [I.V.:466.3; IV Piggyback:1111.8] Out: 2200 [Urine:2200]  . GENERAL:alert, in no acute distress and comfortable SKIN: no acute rashes, no significant lesions EYES: conjunctiva are pink and  non-injected, sclera anicteric OROPHARYNX: MMM, no exudates, no oropharyngeal erythema or ulceration NECK: supple, no JVD LYMPH:  no palpable lymphadenopathy in the cervical, axillary or inguinal regions LUNGS: clear to auscultation b/l with normal respiratory  effort HEART: regular rate & rhythm ABDOMEN:  normoactive bowel sounds , non tender, not distended. Extremity: no pedal edema PSYCH: alert & oriented x 3 with fluent speech NEURO: no focal motor/sensory deficits   LABORATORY DATA:  I have reviewed the data as listed  CMP Latest Ref Rng & Units 03/16/2019 03/15/2019 03/14/2019  Glucose 70 - 99 mg/dL 119(H) 137(H) 159(H)  BUN 6 - 20 mg/dL _0 Creatinine 0.44 - 1.00 mg/dL 0.59 0.54 0.61  Sodium 135 - 145 mmol/L 136 135 134(L)  Potassium 3.5 - 5.1 mmol/L 3.7 3.9 3.4(L)  Chloride 98 - 111 mmol/L 105 104 100  CO2 22 - 32 mmol/L _1 Calcium 8.9 - 10.3 mg/dL 8.8(L) 9.2 8.6(L)  Total Protein 6.5 - 8.1 g/dL 5.6(L) 6.2(L) 6.0(L)  Total Bilirubin 0.3 - 1.2 mg/dL 0.3 0.1(L) 0.3  Alkaline Phos 38 - 126 U/L 70 81 76  AST 15 - 41 U/L _2 ALT 0 - 44 U/L _3 Lab Results  Component Value Date   WBC 7.9 03/16/2019   HGB 8.6 (L) 03/16/2019   HCT 27.3 (L) 03/16/2019   MCV 92.9 03/16/2019   PLT 392 03/16/2019   NEUTROABS 4.7 03/14/2019   . CMP Latest Ref Rng & Units 03/16/2019 03/15/2019 03/14/2019  Glucose 70 - 99 mg/dL 119(H) 137(H) 159(H)  BUN 6 - 20 mg/dL _4 Creatinine 0.44 - 1.00 mg/dL 0.59 0.54 0.61  Sodium 135 - 145 mmol/L 136 135 134(L)  Potassium 3.5 - 5.1 mmol/L 3.7 3.9 3.4(L)  Chloride 98 - 111 mmol/L 105 104 100  CO2 22 - 32 mmol/L _5 Calcium 8.9 - 10.3 mg/dL 8.8(L) 9.2 8.6(L)  Total Protein 6.5 - 8.1 g/dL 5.6(L) 6.2(L) 6.0(L)  Total Bilirubin 0.3 - 1.2 mg/dL 0.3 0.1(L) 0.3  Alkaline Phos 38 - 126 U/L 70 81 76  AST 15 - 41 U/L _6 ALT 0 - 44 U/L _7 Nm Pet Image Restag (ps) Skull Base To Thigh  Result Date: 02/14/2019 CLINICAL DATA:  Subsequent treatment strategy for 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 EXAM: NUCLEAR MEDICINE PET SKULL BASE TO THIGH TECHNIQUE: 6.6 mCi F-18 FDG was injected intravenously. Full-ring PET imaging  was performed from the skull base to thigh after the radiotracer. CT data was obtained and used for attenuation correction and anatomic localization. Fasting blood glucose: 98 mg/dl COMPARISON:  11/10/2018 PET-CT. FINDINGS: Mediastinal blood pool activity: SUV max 1.9 Liver activity: SUV max 3.2 NECK: No new or residual hypermetabolic lymph nodes in the neck. Incidental CT findings: Right internal jugular Port-A-Cath terminates at the cavoatrial junction. CHEST: No new or residual enlarged or hypermetabolic axillary, mediastinal or hilar lymph nodes. Representative 0.7 cm left pericardiophrenic node with max SUV 1.0 (series 4/image 91), previously 1.4 cm with max SUV 09.3. no hypermetabolic pulmonary findings. Incidental CT findings: Small left pleural effusion is decreased. No acute consolidative airspace disease, lung masses or significant pulmonary nodules. Atherosclerotic nonaneurysmal thoracic aorta. ABDOMEN/PELVIS: No new or residual hypermetabolic lymph nodes in the abdomen or pelvis. Previously visualized enlarged retroperitoneal, mesenteric and pelvic lymph nodes have all decreased in size. Representative 1.3 cm  central mesenteric node with max SUV 1.6 (series 4/image 124), previously 3.6 cm with max SUV 16.5. There is new mild homogeneous hypermetabolism in the spleen with max SUV 4.7. Spleen is normal size. No focal splenic hypermetabolism. No abnormal hypermetabolic activity within the liver, pancreas or adrenal glands. Incidental CT findings: Atherosclerotic nonaneurysmal abdominal aorta. SKELETON: There is new diffuse hypermetabolism throughout axial and proximal appendicular skeleton with representative max SUV 9.1 in the L3 vertebral body. No focal skeletal hypermetabolism. Incidental CT findings: none IMPRESSION: 1. Findings favor complete metabolic response. No residual hypermetabolism within the lymph nodes of the neck, chest, abdomen or pelvis, which have all decreased in size in the interval. 2.  Nonspecific new diffuse skeletal hypermetabolism and new mild splenic hypermetabolism, favor reactive state of the marrow and reticuloendothelial system. Spleen is normal size. Continued surveillance with CT or PET-CT advised. 3. Small layering left pleural effusion, decreased. 4.  Aortic Atherosclerosis (ICD10-I70.0). Electronically Signed   By: Ilona Sorrel M.D.   On: 02/14/2019 17:28   Dg Fluoro Guide Lumbar Puncture  Result Date: 03/15/2019 Felipa Emory, MD     03/15/2019  4:01 PM L4-5 interspace was accessed for administration of intrathecal chemotherapy without complication.  Images saved in PACS.  See final dictation for details.   Dg Fluoro Guide Lumbar Puncture  Result Date: 02/23/2019 CLINICAL DATA:  58 year old female with large B-cell lymphoma. Initial lumbar puncture for CSF analysis and intrathecal chemotherapy. EXAM: FLUOROSCOPICALLY GUIDED LUMBAR PUNCTURE INJECTION OF INTRATHECAL CHEMOTHERAPY FLUOROSCOPY TIME:  0 minutes 18 seconds PROCEDURE: Informed consent was obtained from the patient prior to the procedure with the help of an interpreter. Including potential complications of headache, allergy, and pain. A "time-out" was performed, and this was also checked against the patient's chemotherapy from pharmacy. With the patient prone, the lower back was prepped with Betadine. 1% Lidocaine was used for local anesthesia. Lumbar puncture was performed at the L2-L3 level using left sub laminar and a 3.5 in x 20 gauge needle with return of clear, colorless CSF. First, CSF was collected in 4 test tubes totaling about 21 mL of fluid. Next, a 10 mL chemotherapy mixture of 12 milligrams methotrexate and 50 milligrams Solu-Cortef was injected into the subarachnoid space. The needle was withdrawn, direct pressure held and no bleeding encountered. The patient tolerated the procedure well and without apparent complication. Appropriate post procedural orders were placed on the chart. The patient was  returned to the inpatient floor in stable condition for continued treatment. IMPRESSION: 1. Fluoroscopic guided lumbar puncture at L2-L3. 2. Collection of about 21 mL of CSF, sent to the lab for analysis. 3. Injection of intrathecal chemotherapy. Electronically Signed   By: Genevie Ann M.D.   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 The intent is for cure. -We discussed some of the risks, benefitsandside-effects of Rituximab,Etoposide, Vincristine, Adriamycin,Cytoxan,Prednisone.The regimen requires inpatient admission due to continuous chemotherapy infusion  -Some of the short term side-effects included, though not limited to, risk of fatigue, weight loss,tumor lysis syndrome, risk of allergic reactions,pancytopenia, life-threatening infections, need for transfusions of blood products, nausea, vomiting, change  in bowel habits,hairloss, risk of congestive heart failure, admission to hospital for various reasons, and risks of death.  -Long term side-effects are also discussed includingpermanent damage to nerve function, chronic fatigue, and rare secondary malignancy including bone marrow disorders. -The patient is aware that the response rates discussed earlier is not guaranteed. -After a long discussion, patient made an informed decision to proceed with the prescribed plan of care. -She will proceed with day 3 of cycle #4of EPOCH today as scheduled.  -She has antiemetics available to her -Labs from today have been reviewed and are adequate for treatment.  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 -WBC is normal today -We will monitor it for now  Hyponatremia -Na is normal today -Continue Pedialyte and gentle salt addition to the diet, and maintain adequate hydration -We will monitor it closely  Hypokalemia -Potassium was 3.7 today -Will continue potassium chloride 20 mEq daily.  Chemotherapy-associated nausea  -Secondary to chemotherapy -Symptoms relatively well controlled  -Continue PRN-anti-emetics  We will plan for Rituxan and Neulasta on 03/21/2019.  Follow-up visit with Dr. Irene Limbo with lab work on 03/28/2019.   LOS: 2 days   Mikey Bussing MD MS   ADDENDUM  .Patient was Personally and independently interviewed, examined and relevant elements of the history of present illness were reviewed in details and an assessment and plan was created. All elements of the patient's history of present illness , assessment and plan were discussed in details with Mikey Bussing DNP. The above documentation reflects our combined findings assessment and plan.  Sullivan Lone MD MS

## 2019-03-17 DIAGNOSIS — Z5111 Encounter for antineoplastic chemotherapy: Secondary | ICD-10-CM | POA: Diagnosis not present

## 2019-03-17 DIAGNOSIS — C8330 Diffuse large B-cell lymphoma, unspecified site: Secondary | ICD-10-CM | POA: Diagnosis not present

## 2019-03-17 LAB — CBC
HCT: 28.3 % — ABNORMAL LOW (ref 36.0–46.0)
Hemoglobin: 8.9 g/dL — ABNORMAL LOW (ref 12.0–15.0)
MCH: 29.1 pg (ref 26.0–34.0)
MCHC: 31.4 g/dL (ref 30.0–36.0)
MCV: 92.5 fL (ref 80.0–100.0)
Platelets: 380 10*3/uL (ref 150–400)
RBC: 3.06 MIL/uL — ABNORMAL LOW (ref 3.87–5.11)
RDW: 19.9 % — ABNORMAL HIGH (ref 11.5–15.5)
WBC: 4.7 10*3/uL (ref 4.0–10.5)
nRBC: 0 % (ref 0.0–0.2)

## 2019-03-17 LAB — COMPREHENSIVE METABOLIC PANEL
ALT: 30 U/L (ref 0–44)
AST: 24 U/L (ref 15–41)
Albumin: 3.3 g/dL — ABNORMAL LOW (ref 3.5–5.0)
Alkaline Phosphatase: 65 U/L (ref 38–126)
Anion gap: 10 (ref 5–15)
BUN: 14 mg/dL (ref 6–20)
CO2: 22 mmol/L (ref 22–32)
Calcium: 8.6 mg/dL — ABNORMAL LOW (ref 8.9–10.3)
Chloride: 104 mmol/L (ref 98–111)
Creatinine, Ser: 0.59 mg/dL (ref 0.44–1.00)
GFR calc Af Amer: >60 mL/min (ref >60)
GFR calc non Af Amer: >60 mL/min (ref >60)
Glucose, Bld: 153 mg/dL — ABNORMAL HIGH (ref 70–99)
Potassium: 3.5 mmol/L (ref 3.5–5.1)
Sodium: 136 mmol/L (ref 135–145)
Total Bilirubin: 0.4 mg/dL (ref 0.3–1.2)
Total Protein: 5.8 g/dL — ABNORMAL LOW (ref 6.5–8.1)

## 2019-03-17 MED ORDER — VINCRISTINE SULFATE CHEMO INJECTION 1 MG/ML
Freq: Once | INTRAVENOUS | Status: AC
  Administered 2019-03-17: 14:00:00 via INTRAVENOUS
  Filled 2019-03-17: qty 8

## 2019-03-17 MED ORDER — SODIUM CHLORIDE 0.9 % IV SOLN
Freq: Once | INTRAVENOUS | Status: AC
  Administered 2019-03-17: 18 mg via INTRAVENOUS
  Filled 2019-03-17: qty 4

## 2019-03-17 NOTE — Progress Notes (Addendum)
HEMATOLOGY-ONCOLOGY PROGRESS NOTE  SUBJECTIVE: The patient reported having more fatigue yesterday.  She feels better today.  Denies headaches and vision changes.  Reports some intermittent pain around her left eye yesterday which has now resolved.  Chest pain and shortness of breath.  Denies mucositis, abdominal pain, nausea, vomiting.  Bowels are moving without any difficulty.   Oncology History  DLBCL (diffuse large B cell lymphoma) ([place])  10/29/2018 Imaging   CT abdomen/pelvis: IMPRESSION: Abdominopelvic lymphadenopathy, including a dominant 3.2 cm short axis jejunal mesentery nodal mass encasing the SMA, partially necrotic. Overall appearance favors lymphoma, although nodal metastases are also possible.   Spleen is normal in size. However, hypoenhancing lesions are suspected on the portal venous phase, raising the possibility of lymphomatous involvement.   Suspected subcarinal nodal mass, incompletely evaluated. Consider CT chest or PET-CT for further evaluation.   Moderate left pleural effusion. Associated left lower lobe opacity, likely atelectasis.   11/10/2018 Imaging   PET: IMPRESSION: 1. 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 2. Nodal metastasis include the lower neck, mediastinum, mesentery, peritoneum and retroperitoneum, and iliac lymph nodes. 3. Spleen and bone marrow normal. 4. Moderate LEFT pleural effusion. 5. Target lymph nodes for sampling could include the LEFT external iliac lymph node, precordial lymph node in the LEFT upper quadrant, or super clavicular/LEFT sub pectoralis nodes.   12/03/2018 Bone Marrow Biopsy   Bone Marrow, Aspirate,Biopsy, and Clot, left posterior iliac crest - NORMOCELLULAR MARROW WITH FOCAL SMALL LYMPHOID AGGREGATES. - SEE COMMENT. PERIPHERAL BLOOD: - BORDERLINE NORMOCYTIC ANEMIA. Diagnosis Note The marrow has only a few small lymphoid aggregates which are not overtly atypical and  contain a mixture of B-cells and T-cells. Flow cytometry is negative for a monoclonal B-cell population. These findings are not diagnostic of a lymphoproliferative process.  Accession: [id number] Bone Marrow Flow Cytometry - NO MONOCLONAL B-CELL OR PHENOTYPICALLY ABERRANT T-CELL POPULATION IDENTIFIED.   12/03/2018 Imaging   TTE:  1. The left ventricle has normal systolic function with an ejection fraction of 60-65%. The cavity size was normal. Left ventricular diastolic Doppler parameters are consistent with impaired relaxation.  2. The right ventricle has normal systolic function. The cavity was normal. There is no increase in right ventricular wall thickness.  3. Large pleural effusion.  4. Clinical correlation suggested.  5. The mitral valve is grossly normal.  6. The tricuspid valve is grossly normal.  7. The aortic valve is grossly normal. Aortic valve regurgitation was not assessed by color flow Doppler.  8. The aortic root is normal in size and structure.   12/08/2018 Procedure   Left thoracentesis   12/08/2018 Pathology Results   Diagnosis PLEURAL FLUID, LEFT (SPECIMEN 1 OF 1 COLLECTED 12/08/18): - B-CELL LYMPHOMA - SEE COMMENT   12/09/2018 Procedure   Left supraclavicular LN bx    12/09/2018 Pathology Results   Accession: [id number] Lymph node for lymphoma, left deep cervical - DIFFUSE LARGE B-CELL LYMPHOMA - SEE COMMENT   12/21/2018 - 01/10/2019 Chemotherapy   The patient had DOXOrubicin (ADRIAMYCIN) chemo injection 80 mg, 50 mg/m2 = 80 mg, Intravenous,  Once, 1 of 6 cycles Administration: 80 mg (12/21/2018) palonosetron (ALOXI) injection 0.25 mg, 0.25 mg, Intravenous,  Once, 1 of 6 cycles Administration: 0.25 mg (12/21/2018) pegfilgrastim-cbqv (UDENYCA) injection 6 mg, 6 mg, Subcutaneous, Once, 1 of 6 cycles Administration: 6 mg (12/23/2018) vinCRIStine (ONCOVIN) 2 mg in sodium chloride 0.9 % 50 mL chemo infusion, 2 mg, Intravenous,  Once, 1 of 6 cycles Administration: 2 mg  (12/21/2018) riTUXimab (RITUXAN)  600 mg in sodium chloride 0.9 % 250 mL (1.9355 mg/mL) infusion, 375 mg/m2 = 600 mg, Intravenous,  Once, 1 of 1 cycle Administration: 600 mg (12/21/2018) cyclophosphamide (CYTOXAN) 1,200 mg in sodium chloride 0.9 % 250 mL chemo infusion, 750 mg/m2 = 1,200 mg, Intravenous,  Once, 1 of 6 cycles Administration: 1,200 mg (12/21/2018) riTUXimab (RITUXAN) 600 mg in sodium chloride 0.9 % 190 mL infusion, 375 mg/m2 = 600 mg (100 % of original dose 375 mg/m2), Intravenous,  Once, 0 of 5 cycles Dose modification: 375 mg/m2 (original dose 375 mg/m2, Cycle 2, Reason: Provider Judgment, Comment: VO to change to RIR) fosaprepitant (EMEND) 150 mg, dexamethasone (DECADRON) 12 mg in sodium chloride 0.9 % 145 mL IVPB, , Intravenous,  Once, 1 of 6 cycles Administration:  (12/21/2018)  for chemotherapy treatment.    01/10/2019 -  Chemotherapy   The patient had DOXOrubicin (ADRIAMYCIN) 16 mg, etoposide (VEPESID) 80 mg, vinCRIStine (ONCOVIN) 0.6 mg in sodium chloride 0.9 % 1,000 mL chemo infusion, , Intravenous, Once, 4 of 5 cycles Administration:  (01/10/2019),  (01/11/2019),  (01/31/2019),  (02/01/2019),  (01/12/2019),  (01/13/2019),  (02/02/2019),  (02/03/2019),  (02/21/2019),  (02/22/2019),  (02/23/2019),  (02/24/2019),  (03/14/2019),  (03/15/2019) ondansetron (ZOFRAN) 8 mg, dexamethasone (DECADRON) 10 mg in sodium chloride 0.9 % 50 mL IVPB, , Intravenous,  Once, 4 of 5 cycles Administration: 18 mg (01/10/2019), 8 mg (01/11/2019), 36 mg (01/14/2019), 8 mg (01/31/2019), 18 mg (02/01/2019), 36 mg (02/04/2019), 18 mg (01/12/2019), 18 mg (01/13/2019), 18 mg (02/02/2019), 18 mg (02/03/2019), 18 mg (02/21/2019), 8 mg (02/22/2019), 36 mg (02/25/2019), 8 mg (02/23/2019), 8 mg (02/24/2019), 8 mg (03/14/2019), 18 mg (03/15/2019) methotrexate (PF) 12 mg, hydrocortisone sodium succinate (SOLU-CORTEF) 50 mg in sodium chloride (PF) 0.9 % INTRATHECAL chemo injection, , Intrathecal,  Once, 2 of 3 cycles Administration:  (02/23/2019),   (03/15/2019) cyclophosphamide (CYTOXAN) 1,180 mg in sodium chloride 0.9 % 250 mL chemo infusion, 750 mg/m2 = 1,180 mg, Intravenous,  Once, 4 of 5 cycles Administration: 1,180 mg (01/14/2019), 1,180 mg (02/04/2019), 1,180 mg (02/25/2019)  for chemotherapy treatment.    01/17/2019 -  Chemotherapy   The patient had pegfilgrastim-cbqv (UDENYCA) injection 6 mg, 6 mg, Subcutaneous, Once, 3 of 6 cycles Administration: 6 mg (01/17/2019), 6 mg (02/07/2019), 6 mg (02/28/2019) riTUXimab (RITUXAN) 600 mg in sodium chloride 0.9 % 250 mL (1.9355 mg/mL) infusion, 375 mg/m2 = 600 mg, Intravenous,  Once, 3 of 3 cycles Administration: 600 mg (02/07/2019) riTUXimab (RITUXAN) 600 mg in sodium chloride 0.9 % 190 mL infusion, 375 mg/m2 = 600 mg, Intravenous,  Once, 1 of 4 cycles Administration: 600 mg (02/28/2019)  for chemotherapy treatment.       REVIEW OF SYSTEMS:   10 Point review of Systems was done is negative except as noted above.   I have reviewed the past medical history, past surgical history, social history and family history with the patient and they are unchanged from previous note.   PHYSICAL EXAMINATION: ECOG PERFORMANCE STATUS: 0 - Asymptomatic  Vitals:   03/16/19 2100 03/17/19 0547  BP: (!) 155/85 (!) 144/80  Pulse: 67 63  Resp: 17 16  Temp: 98.3 F (36.8 C) 97.9 F (36.6 C)  SpO2: 99% 99%   Filed Weights   03/14/19 1219  Weight: 130 lb 11.7 oz (59.3 kg)    Intake/Output from previous day: 10/28 0701 - 10/29 0700 In: 1287.2 [P.O.:480; I.V.:246.2; IV Piggyback:561] Out: 7494 [Urine:3325]  . GENERAL:alert, in no acute distress and comfortable SKIN: no acute rashes, no  significant lesions EYES: conjunctiva are pink and non-injected, sclera anicteric OROPHARYNX: MMM, no exudates, no oropharyngeal erythema or ulceration NECK: supple, no JVD LYMPH:  no palpable lymphadenopathy in the cervical, axillary or inguinal regions LUNGS: clear to auscultation b/l with normal respiratory  effort HEART: regular rate & rhythm ABDOMEN:  normoactive bowel sounds , non tender, not distended. Extremity: no pedal edema PSYCH: alert & oriented x 3 with fluent speech NEURO: no focal motor/sensory deficits   LABORATORY DATA:  I have reviewed the data as listed  CMP Latest Ref Rng & Units 03/17/2019 03/16/2019 03/15/2019  Glucose 70 - 99 mg/dL 153(H) 119(H) 137(H)  BUN 6 - 20 mg/dL _0 Creatinine 0.44 - 1.00 mg/dL 0.59 0.59 0.54  Sodium 135 - 145 mmol/L 136 136 135  Potassium 3.5 - 5.1 mmol/L 3.5 3.7 3.9  Chloride 98 - 111 mmol/L 104 105 104  CO2 22 - 32 mmol/L _1 Calcium 8.9 - 10.3 mg/dL 8.6(L) 8.8(L) 9.2  Total Protein 6.5 - 8.1 g/dL 5.8(L) 5.6(L) 6.2(L)  Total Bilirubin 0.3 - 1.2 mg/dL 0.4 0.3 0.1(L)  Alkaline Phos 38 - 126 U/L 65 70 81  AST 15 - 41 U/L _2 ALT 0 - 44 U/L _3 Lab Results  Component Value Date   WBC 4.7 03/17/2019   HGB 8.9 (L) 03/17/2019   HCT 28.3 (L) 03/17/2019   MCV 92.5 03/17/2019   PLT 380 03/17/2019   NEUTROABS 4.7 03/14/2019   . CMP Latest Ref Rng & Units 03/17/2019 03/16/2019 03/15/2019  Glucose 70 - 99 mg/dL 153(H) 119(H) 137(H)  BUN 6 - 20 mg/dL _4 Creatinine 0.44 - 1.00 mg/dL 0.59 0.59 0.54  Sodium 135 - 145 mmol/L 136 136 135  Potassium 3.5 - 5.1 mmol/L 3.5 3.7 3.9  Chloride 98 - 111 mmol/L 104 105 104  CO2 22 - 32 mmol/L _5 Calcium 8.9 - 10.3 mg/dL 8.6(L) 8.8(L) 9.2  Total Protein 6.5 - 8.1 g/dL 5.8(L) 5.6(L) 6.2(L)  Total Bilirubin 0.3 - 1.2 mg/dL 0.4 0.3 0.1(L)  Alkaline Phos 38 - 126 U/L 65 70 81  AST 15 - 41 U/L _6 ALT 0 - 44 U/L _7 Dg Fluoro Guide Lumbar Puncture  Result Date: 03/15/2019 Felipa Emory, MD     03/15/2019  4:01 PM L4-5 interspace was accessed for administration of intrathecal chemotherapy without complication.  Images saved in PACS.  See final dictation for details.   Dg Fluoro Guide Lumbar Puncture  Result Date: 02/23/2019 CLINICAL DATA:   58 year old female with large B-cell lymphoma. Initial lumbar puncture for CSF analysis and intrathecal chemotherapy. EXAM: FLUOROSCOPICALLY GUIDED LUMBAR PUNCTURE INJECTION OF INTRATHECAL CHEMOTHERAPY FLUOROSCOPY TIME:  0 minutes 18 seconds PROCEDURE: Informed consent was obtained from the patient prior to the procedure with the help of an interpreter. Including potential complications of headache, allergy, and pain. A "time-out" was performed, and this was also checked against the patient's chemotherapy from pharmacy. With the patient prone, the lower back was prepped with Betadine. 1% Lidocaine was used for local anesthesia. Lumbar puncture was performed at the L2-L3 level using left sub laminar and a 3.5 in x 20 gauge needle with return of clear, colorless CSF. First, CSF was collected in 4 test tubes totaling about 21 mL of fluid. Next, a 10 mL chemotherapy mixture of 12 milligrams methotrexate and 50 milligrams Solu-Cortef was injected  into the subarachnoid space. The needle was withdrawn, direct pressure held and no bleeding encountered. The patient tolerated the procedure well and without apparent complication. Appropriate post procedural orders were placed on the chart. The patient was returned to the inpatient floor in stable condition for continued treatment. IMPRESSION: 1. Fluoroscopic guided lumbar puncture at L2-L3. 2. Collection of about 21 mL of CSF, sent to the lab for analysis. 3. Injection of intrathecal chemotherapy. Electronically Signed   By: Genevie Ann M.D.   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 The intent is for cure. -We discussed some of the risks, benefitsandside-effects of Rituximab,Etoposide, Vincristine, Adriamycin,Cytoxan,Prednisone.The regimen requires inpatient admission due to continuous chemotherapy infusion  -Some of the short term side-effects included, though not limited to, risk of fatigue, weight loss,tumor lysis syndrome, risk of allergic reactions,pancytopenia, life-threatening infections, need for transfusions of blood products, nausea, vomiting, change in bowel habits,hairloss, risk of congestive heart failure, admission to hospital for various reasons, and risks of death.  -Long term side-effects are also discussed includingpermanent damage to nerve function, chronic fatigue, and rare secondary malignancy including bone marrow disorders. -The patient is aware that the response rates discussed earlier is not guaranteed. -After a long discussion, patient made an informed decision to proceed with the prescribed plan of care. -She will proceed with day 4 of cycle #4of EPOCH today as scheduled.  -She has antiemetics available to her -Labs from today have been reviewed and are adequate for treatment.  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 -WBC is normal today -We will monitor it for now  Hyponatremia -Na is normal today -Continue  Pedialyte and gentle salt addition to the diet, and maintain adequate hydration -We will monitor it closely  Hypokalemia -Potassium was 3.5 today -Will continue potassium chloride 20 mEq daily.  Chemotherapy-associated nausea  -Secondary to chemotherapy -Symptoms relatively well controlled  -Continue PRN-anti-emetics  We will plan for Rituxan and Neulasta on 03/21/2019.  Follow-up visit with Dr. Irene Limbo with lab work on 03/28/2019.   LOS: 3 days   Mikey Bussing MD MS  ADDENDUM  .Patient was Personally and independently interviewed, examined and relevant elements of the history of present illness were reviewed in details and an assessment and plan was created. All elements of the patient's history of present illness , assessment and plan were discussed in details with Mikey Bussing, DNP, AGPCNP-BC, AOCNP. The above documentation reflects our combined findings assessment and plan.  Sullivan Lone MD MS

## 2019-03-18 DIAGNOSIS — C8330 Diffuse large B-cell lymphoma, unspecified site: Secondary | ICD-10-CM | POA: Diagnosis not present

## 2019-03-18 DIAGNOSIS — Z5111 Encounter for antineoplastic chemotherapy: Secondary | ICD-10-CM | POA: Diagnosis not present

## 2019-03-18 LAB — CBC
HCT: 27 % — ABNORMAL LOW (ref 36.0–46.0)
Hemoglobin: 8.7 g/dL — ABNORMAL LOW (ref 12.0–15.0)
MCH: 29.6 pg (ref 26.0–34.0)
MCHC: 32.2 g/dL (ref 30.0–36.0)
MCV: 91.8 fL (ref 80.0–100.0)
Platelets: 332 10*3/uL (ref 150–400)
RBC: 2.94 MIL/uL — ABNORMAL LOW (ref 3.87–5.11)
RDW: 19.5 % — ABNORMAL HIGH (ref 11.5–15.5)
WBC: 2.1 10*3/uL — ABNORMAL LOW (ref 4.0–10.5)
nRBC: 0 % (ref 0.0–0.2)

## 2019-03-18 LAB — COMPREHENSIVE METABOLIC PANEL
ALT: 35 U/L (ref 0–44)
AST: 18 U/L (ref 15–41)
Albumin: 3.3 g/dL — ABNORMAL LOW (ref 3.5–5.0)
Alkaline Phosphatase: 60 U/L (ref 38–126)
Anion gap: 8 (ref 5–15)
BUN: 14 mg/dL (ref 6–20)
CO2: 23 mmol/L (ref 22–32)
Calcium: 8.7 mg/dL — ABNORMAL LOW (ref 8.9–10.3)
Chloride: 107 mmol/L (ref 98–111)
Creatinine, Ser: 0.6 mg/dL (ref 0.44–1.00)
GFR calc Af Amer: >60 mL/min (ref >60)
GFR calc non Af Amer: >60 mL/min (ref >60)
Glucose, Bld: 100 mg/dL — ABNORMAL HIGH (ref 70–99)
Potassium: 3.9 mmol/L (ref 3.5–5.1)
Sodium: 138 mmol/L (ref 135–145)
Total Bilirubin: 0.4 mg/dL (ref 0.3–1.2)
Total Protein: 5.5 g/dL — ABNORMAL LOW (ref 6.5–8.1)

## 2019-03-18 MED ORDER — SODIUM CHLORIDE 0.9 % IV SOLN
750.0000 mg/m2 | Freq: Once | INTRAVENOUS | Status: AC
  Administered 2019-03-18: 1180 mg via INTRAVENOUS
  Filled 2019-03-18: qty 59

## 2019-03-18 MED ORDER — SODIUM CHLORIDE 0.9 % IV SOLN
Freq: Once | INTRAVENOUS | Status: AC
  Administered 2019-03-18: 36 mg via INTRAVENOUS
  Filled 2019-03-18: qty 8

## 2019-03-18 MED ORDER — HEPARIN SOD (PORK) LOCK FLUSH 100 UNIT/ML IV SOLN
500.0000 [IU] | INTRAVENOUS | Status: DC | PRN
  Filled 2019-03-18: qty 5

## 2019-03-18 NOTE — Progress Notes (Signed)
Pt discharged home in stable condition. Discharge instructions given. No immediate questions or concerns at this time.

## 2019-03-18 NOTE — Discharge Summary (Addendum)
.  [place]  [phone] [phone]    Physician Discharge Summary     Patient ID: Denise Walls MRN: [id number] DOB/AGE: 02/06/1961 58 y.o.  Admit date: 03/14/2019 Discharge date: 03/18/2019  Primary Care Physician:  [name], MD   Discharge Diagnoses:    Present on Admission:  DLBCL (diffuse large B cell lymphoma) [facility])   Discharge Medications:  Allergies as of 03/18/2019      Reactions   Nsaids Anaphylaxis, Other (See Comments)   Abdominal pain, GI Bleeding      Medication List    TAKE these medications   acetaminophen 325 MG tablet Commonly known as: TYLENOL Take 325-650 mg by mouth every 6 (six) hours as needed for mild pain.   B COMPLEX PO Take 1 tablet by mouth daily.   calcium carbonate 500 MG chewable tablet Commonly known as: TUMS - dosed in mg elemental calcium Chew 2 tablets by mouth daily as needed for indigestion or heartburn.   cholecalciferol 25 MCG (1000 UT) tablet Commonly known as: VITAMIN D3 Take 1 tablet (1,000 Units total) by mouth daily.   lidocaine-prilocaine cream Commonly known as: EMLA Apply to affected area once What changed:   how much to take  how to take this  when to take this  reasons to take this  additional instructions   meclizine 25 MG tablet Commonly known as: ANTIVERT Take 1 tablet (25 mg total) by mouth 3 (three) times daily as needed for dizziness.   mesalamine 1.2 g EC tablet Commonly known as: LIALDA Take 2.4 g by mouth daily.   multivitamin-prenatal 27-0.8 MG Tabs tablet Take 1 tablet by mouth daily at 12 noon.   ondansetron 8 MG tablet Commonly known as: Zofran Take 1 tablet (8 mg total) by mouth 2 (two) times daily as needed for refractory nausea / vomiting. Start on day 3 after cyclophosphamide chemotherapy.   polyethylene glycol 17 g packet Commonly known as: MIRALAX / GLYCOLAX Take 17 g by mouth daily as needed for mild  constipation.   potassium chloride SA 20 MEQ tablet Commonly known as: KLOR-CON Take 1 tablet (20 mEq total) by mouth daily.   traZODone 50 MG tablet Commonly known as: DESYREL Take 1 tablet (50 mg total) by mouth at bedtime as needed for sleep. What changed: how much to take        Disposition and Follow-up: Rituxan and Neulasta scheduled at the [facility] on 03/21/2019.  Follow-up with Dr. Irene Limbo with repeat lab work on 03/28/2019.  Significant Diagnostic Studies:  Dg Fluoro Guide Lumbar Puncture  Result Date: 03/15/2019 CLINICAL DATA:  Fluoro guided intrathecal methotrexate for CNS prophylaxis in the setting of lymphoma. EXAM: Fluoroscopic guided lumbar puncture. Injection of intrathecal chemotherapy. FLUOROSCOPY TIME:  Fluoroscopy Time:  1 minutes Radiation Exposure Index (if provided by the fluoroscopic device): 5.9 mGy Number of Acquired Spot Images: 0 COMPARISON:  02/23/2019 FINDINGS: Informed consent was obtained from the patient prior to the procedure with the help of an interpreter. The risks including headache, allergy and pain were discussed. A time-out was performed and this was also checked against the patient's chemotherapy from the pharmacy. With the patient in the prone position the lower back was prepped and draped with Betadine. 1% lidocaine was utilized for local anesthesia over the site. Lumbar puncture was performed at the L4-5 level with a 3.5 inch 20 gauge needle. After clear CSF flow was confirmed injection of 10 mL chemotherapy mixture  of 12 mg methotrexate and 50 mg Solu-Cortef was injected into the space. The patient tolerated the procedure well. Post procedure orders were placed, short note was entered into the chart and the patient was turned to the inpatient floor in stable condition. IMPRESSION: Successful lumbar puncture at L4-5 with injection of intrathecal chemotherapy. Electronically Signed   By: Zetta Bills M.D.   On: 03/15/2019 16:37   Dg  Fluoro Guide Lumbar Puncture  Result Date: 02/23/2019 CLINICAL DATA:  58 year old female with large B-cell lymphoma. Initial lumbar puncture for CSF analysis and intrathecal chemotherapy. EXAM: FLUOROSCOPICALLY GUIDED LUMBAR PUNCTURE INJECTION OF INTRATHECAL CHEMOTHERAPY FLUOROSCOPY TIME:  0 minutes 18 seconds PROCEDURE: Informed consent was obtained from the patient prior to the procedure with the help of an interpreter. Including potential complications of headache, allergy, and pain. A "time-out" was performed, and this was also checked against the patient's chemotherapy from pharmacy. With the patient prone, the lower back was prepped with Betadine. 1% Lidocaine was used for local anesthesia. Lumbar puncture was performed at the L2-L3 level using left sub laminar and a 3.5 in x 20 gauge needle with return of clear, colorless CSF. First, CSF was collected in 4 test tubes totaling about 21 mL of fluid. Next, a 10 mL chemotherapy mixture of 12 milligrams methotrexate and 50 milligrams Solu-Cortef was injected into the subarachnoid space. The needle was withdrawn, direct pressure held and no bleeding encountered. The patient tolerated the procedure well and without apparent complication. Appropriate post procedural orders were placed on the chart. The patient was returned to the inpatient floor in stable condition for continued treatment. IMPRESSION: 1. Fluoroscopic guided lumbar puncture at L2-L3. 2. Collection of about 21 mL of CSF, sent to the lab for analysis. 3. Injection of intrathecal chemotherapy. Electronically Signed   By: Genevie Ann M.D.   On: 02/23/2019 16:49    Discharge Laboratory Values: . CBC Latest Ref Rng & Units 03/18/2019 03/17/2019 03/16/2019  WBC 4.0 - 10.5 K/uL 2.1(L) 4.7 7.9  Hemoglobin 12.0 - 15.0 g/dL 8.7(L) 8.9(L) 8.6(L)  Hematocrit 36.0 - 46.0 % 27.0(L) 28.3(L) 27.3(L)  Platelets 150 - 400 K/uL 332 380 392    . CMP Latest Ref Rng & Units 03/18/2019 03/17/2019 03/16/2019    Glucose 70 - 99 mg/dL 100(H) 153(H) 119(H)  BUN 6 - 20 mg/dL 14 14 13   Creatinine 0.44 - 1.00 mg/dL 0.60 0.59 0.59  Sodium 135 - 145 mmol/L 138 136 136  Potassium 3.5 - 5.1 mmol/L 3.9 3.5 3.7  Chloride 98 - 111 mmol/L 107 104 105  CO2 22 - 32 mmol/L 23 22 22   Calcium 8.9 - 10.3 mg/dL 8.7(L) 8.6(L) 8.8(L)  Total Protein 6.5 - 8.1 g/dL 5.5(L) 5.8(L) 5.6(L)  Total Bilirubin 0.3 - 1.2 mg/dL 0.4 0.4 0.3  Alkaline Phos 38 - 126 U/L 60 65 70  AST 15 - 41 U/L 18 24 20   ALT 0 - 44 U/L 35 30 22     Brief H and P: For complete details please refer to admission H and P, but in brief,   Ms. Lajeunesse is a 58 year old female with a past medical history significant for anxiety, Crohn's disease, hypertension, hyperlipidemia, migraine headaches. The patient reports that she has history of Crohn's disease, for which she has been taking mesalamine with very good control of her symptoms. She was previously followed by Dr. Kathy Breach gastroenterology and to transition her care to a different gastroenterologist after Dr.[facility]ptamoved to [facility]. Her new gastroenterologist stopped her  mesalamine some time in 2019,and soon after that, she began to develop new onset left lower quadrant discomfort/pain,nonradiating, dull, intermittent, mild to moderate in intensity, exacerbated by eating and improved with defecation. She underwent colonoscopy, which was unremarkable, and was recommended to modify her diet without any improvement. She then re-established her care with Dr. Etter Sjogren ordered CT abdomen/pelvis that showed diffuse abdominal lymphadenopathy. PET showed diffuse lymph node involvement in the neck, chest, abdomen and the pelvis,and patient was referred to oncology for further evaluation. 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 The patient initially received 1 cycle of R-CHOPwhich she tolerated fairly well with  exception of hyponatremia and neutropenia. Prior to her second planned cycle of chemotherapy, she was found to be positive for Myc and BCL-2 rearrangement. She is now receivingR-EPOCH and is here for cycle #4of this chemotherapy.   On admission, the patient reports that she is feeling well overall.  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  Appetite has been good.  Denies mucositis.  Denies headaches and dizziness.  Reports stable breathing and no cough.  Denies chest pain.  Denies abdominal pain, nausea, vomiting, constipation, diarrhea.  Denies bleeding.  The patient is here for admission for cycle #4 of R-EPOCH.  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 PLAN -She tolerated her third cycle of R-EPOCH without any prohibitive toxicities. -tolerated second cycle of CNS prophylaxis with IT MTX without any prohibitive toxicities.  Had some sense of discomfort around her left eye but no overt headaches or back pain.  This sensation resolved in 24 hours. -she will return to clinic on 03/21/2019 for outpatient Rituximab and Udenyca She has outpatient follow-up with MD visit and labs on 03/28/2019. -She has antiemetics available to her.  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  This remains overall stable. -Patient denies any symptoms of bleeding -We will monitor it for now-no indication for PRBC  transfusion -Repeat CBC as outpatient.   Leukocytosis -Most likely secondary to G-CSF  -Now resolved  Hyponatremia -Sodium level has normalized -Patient was recommended to increase the salt in her diet for now  Hypokalemia -Potassium is normal at 3.9-Continue potassium chloride 20 mEq daily -Repeat CMET as outpatient.   Chemotherapy-associated nausea  -Secondary to chemotherapy -Symptoms relatively well controlled  -Continue PRN-anti-emetics   Physical Exam at Discharge: BP (!) 147/87 (BP Location: Left Arm)    Pulse 62    Temp 97.8 F (36.6 C) (Oral)    Resp 17    Ht 4' 11"  (1.499 m)    Wt 130 lb 11.7 oz (59.3 kg)    LMP  (LMP Unknown)    SpO2 98%    BMI 26.40 kg/m  . GENERAL:alert, in no acute distress and comfortable SKIN: no acute rashes, no significant lesions EYES: conjunctiva are pink and non-injected, sclera anicteric OROPHARYNX: MMM, no exudates, no oropharyngeal erythema or ulceration NECK: supple, no JVD LYMPH:  no palpable lymphadenopathy in the cervical, axillary or inguinal regions LUNGS: clear to auscultation b/l with normal respiratory effort HEART: regular rate & rhythm ABDOMEN:  normoactive bowel sounds , non tender, not distended. Extremity: no pedal edema PSYCH: alert & oriented x 3 with fluent speech NEURO: no focal motor/sensory deficits  Hospital Course:  Active Problems:   DLBCL (diffuse large B cell lymphoma) (HCC)   Diet:  Regular  Activity:  As instructed with infection precautions  Condition at Discharge:   [place], DNP, AGPCNP-BC, AOCNP   03/18/2019, 8:52 AM   ADDENDUM  .Patient was Personally and independently interviewed, examined and relevant results reviewed in details and discharge plan was formuated and was discussed in details with Mikey Bussing, DNP, AGPCNP-BC, AOCNP. The above documentation reflects our combined findings assessment and plan.  Sullivan Lone MD MS  TT spent discharging  patient>71mns

## 2019-03-21 ENCOUNTER — Inpatient Hospital Stay: Payer: Commercial Managed Care - PPO | Attending: Hematology

## 2019-03-21 ENCOUNTER — Other Ambulatory Visit: Payer: Self-pay

## 2019-03-21 VITALS — BP 117/84 | HR 93 | Temp 98.7°F | Resp 18

## 2019-03-21 DIAGNOSIS — Z5189 Encounter for other specified aftercare: Secondary | ICD-10-CM | POA: Diagnosis not present

## 2019-03-21 DIAGNOSIS — Z5112 Encounter for antineoplastic immunotherapy: Secondary | ICD-10-CM | POA: Insufficient documentation

## 2019-03-21 DIAGNOSIS — C8330 Diffuse large B-cell lymphoma, unspecified site: Secondary | ICD-10-CM

## 2019-03-21 MED ORDER — ACETAMINOPHEN 325 MG PO TABS
ORAL_TABLET | ORAL | Status: AC
  Filled 2019-03-21: qty 2

## 2019-03-21 MED ORDER — SODIUM CHLORIDE 0.9 % IV SOLN
Freq: Once | INTRAVENOUS | Status: AC
  Administered 2019-03-21: 10:00:00 via INTRAVENOUS
  Filled 2019-03-21: qty 250

## 2019-03-21 MED ORDER — DIPHENHYDRAMINE HCL 25 MG PO CAPS
50.0000 mg | ORAL_CAPSULE | Freq: Once | ORAL | Status: AC
  Administered 2019-03-21: 50 mg via ORAL

## 2019-03-21 MED ORDER — HEPARIN SOD (PORK) LOCK FLUSH 100 UNIT/ML IV SOLN
500.0000 [IU] | Freq: Once | INTRAVENOUS | Status: AC | PRN
  Administered 2019-03-21: 500 [IU]
  Filled 2019-03-21: qty 5

## 2019-03-21 MED ORDER — PEGFILGRASTIM 6 MG/0.6ML ~~LOC~~ PSKT
PREFILLED_SYRINGE | SUBCUTANEOUS | Status: AC
  Filled 2019-03-21: qty 0.6

## 2019-03-21 MED ORDER — SODIUM CHLORIDE 0.9% FLUSH
10.0000 mL | INTRAVENOUS | Status: DC | PRN
  Administered 2019-03-21: 10 mL
  Filled 2019-03-21: qty 10

## 2019-03-21 MED ORDER — DIPHENHYDRAMINE HCL 25 MG PO CAPS
ORAL_CAPSULE | ORAL | Status: AC
  Filled 2019-03-21: qty 2

## 2019-03-21 MED ORDER — PEGFILGRASTIM-CBQV 6 MG/0.6ML ~~LOC~~ SOSY
6.0000 mg | PREFILLED_SYRINGE | Freq: Once | SUBCUTANEOUS | Status: AC
  Administered 2019-03-21: 13:00:00 6 mg via SUBCUTANEOUS

## 2019-03-21 MED ORDER — SODIUM CHLORIDE 0.9 % IV SOLN
375.0000 mg/m2 | Freq: Once | INTRAVENOUS | Status: AC
  Administered 2019-03-21: 600 mg via INTRAVENOUS
  Filled 2019-03-21: qty 50

## 2019-03-21 MED ORDER — ACETAMINOPHEN 325 MG PO TABS
650.0000 mg | ORAL_TABLET | Freq: Once | ORAL | Status: AC
  Administered 2019-03-21: 650 mg via ORAL

## 2019-03-21 MED ORDER — PEGFILGRASTIM-CBQV 6 MG/0.6ML ~~LOC~~ SOSY
PREFILLED_SYRINGE | SUBCUTANEOUS | Status: AC
  Filled 2019-03-21: qty 0.6

## 2019-03-21 NOTE — Patient Instructions (Signed)
[facility] Discharge Instructions for Patients Receiving Chemotherapy  Today you received the following chemotherapy agents Rituximab (RITUXAN).  To help prevent nausea and vomiting after your treatment, we encourage you to take your nausea medication as prescribed.   If you develop nausea and vomiting that is not controlled by your nausea medication, call the clinic.   BELOW ARE SYMPTOMS THAT SHOULD BE REPORTED IMMEDIATELY:  *FEVER GREATER THAN 100.5 F  *CHILLS WITH OR WITHOUT FEVER  NAUSEA AND VOMITING THAT IS NOT CONTROLLED WITH YOUR NAUSEA MEDICATION  *UNUSUAL SHORTNESS OF BREATH  *UNUSUAL BRUISING OR BLEEDING  TENDERNESS IN MOUTH AND THROAT WITH OR WITHOUT PRESENCE OF ULCERS  *URINARY PROBLEMS  *BOWEL PROBLEMS  UNUSUAL RASH Items with * indicate a potential emergency and should be followed up as soon as possible.  Feel free to call the clinic should you have any questions or concerns. The clinic phone number is [phone].  Please show the [place] at check-in to the Emergency Department and triage nurse.  Pegfilgrastim injection What is this medicine? PEGFILGRASTIM (PEG fil gra stim) is a long-acting granulocyte colony-stimulating factor that stimulates the growth of neutrophils, a type of white blood cell important in the body's fight against infection. It is used to reduce the incidence of fever and infection in patients with certain types of cancer who are receiving chemotherapy that affects the bone marrow, and to increase survival after being exposed to high doses of radiation. This medicine may be used for other purposes; ask your health care provider or pharmacist if you have questions. COMMON BRAND NAME(S): Steve Rattler, Ziextenzo What should I tell my health care provider before I take this medicine? They need to know if you have any of these conditions:  kidney disease  latex allergy  ongoing radiation  therapy  sickle cell disease  skin reactions to acrylic adhesives (On-Body Injector only)  an unusual or allergic reaction to pegfilgrastim, filgrastim, other medicines, foods, dyes, or preservatives  pregnant or trying to get pregnant  breast-feeding How should I use this medicine? This medicine is for injection under the skin. If you get this medicine at home, you will be taught how to prepare and give the pre-filled syringe or how to use the On-body Injector. Refer to the patient Instructions for Use for detailed instructions. Use exactly as directed. Tell your healthcare provider immediately if you suspect that the On-body Injector may not have performed as intended or if you suspect the use of the On-body Injector resulted in a missed or partial dose. It is important that you put your used needles and syringes in a special sharps container. Do not put them in a trash can. If you do not have a sharps container, call your pharmacist or healthcare provider to get one. Talk to your pediatrician regarding the use of this medicine in children. While this drug may be prescribed for selected conditions, precautions do apply. Overdosage: If you think you have taken too much of this medicine contact a poison control center or emergency room at once. NOTE: This medicine is only for you. Do not share this medicine with others. What if I miss a dose? It is important not to miss your dose. Call your doctor or health care professional if you miss your dose. If you miss a dose due to an On-body Injector failure or leakage, a new dose should be administered as soon as possible using a single prefilled syringe for manual use. What may  interact with this medicine? Interactions have not been studied. Give your health care provider a list of all the medicines, herbs, non-prescription drugs, or dietary supplements you use. Also tell them if you smoke, drink alcohol, or use illegal drugs. Some items may interact  with your medicine. This list may not describe all possible interactions. Give your health care provider a list of all the medicines, herbs, non-prescription drugs, or dietary supplements you use. Also tell them if you smoke, drink alcohol, or use illegal drugs. Some items may interact with your medicine. What should I watch for while using this medicine? You may need blood work done while you are taking this medicine. If you are going to need a MRI, CT scan, or other procedure, tell your doctor that you are using this medicine (On-Body Injector only). What side effects may I notice from receiving this medicine? Side effects that you should report to your doctor or health care professional as soon as possible:  allergic reactions like skin rash, itching or hives, swelling of the face, lips, or tongue  back pain  dizziness  fever  pain, redness, or irritation at site where injected  pinpoint red spots on the skin  red or dark-brown urine  shortness of breath or breathing problems  stomach or side pain, or pain at the shoulder  swelling  tiredness  trouble passing urine or change in the amount of urine Side effects that usually do not require medical attention (report to your doctor or health care professional if they continue or are bothersome):  bone pain  muscle pain This list may not describe all possible side effects. Call your doctor for medical advice about side effects. You may report side effects to FDA at 1-800-FDA-1088. Where should I keep my medicine? Keep out of the reach of children. If you are using this medicine at home, you will be instructed on how to store it. Throw away any unused medicine after the expiration date on the label. NOTE: This sheet is a summary. It may not cover all possible information. If you have questions about this medicine, talk to your doctor, pharmacist, or health care provider.  2020 Elsevier/Gold Standard (2017-08-10  16:57:08)  Coronavirus (COVID-19) Are you at risk?  Are you at risk for the Coronavirus (COVID-19)?  To be considered HIGH RISK for Coronavirus (COVID-19), you have to meet the following criteria:  . Traveled to [place], [place], [place], [place] or [place]; or in the [place] to [place], [place], [place], or [place]; and have fever, cough, and shortness of breath within the last 2 weeks of travel OR . Been in close contact with a person diagnosed with COVID-19 within the last 2 weeks and have fever, cough, and shortness of breath . IF YOU DO NOT MEET THESE CRITERIA, YOU ARE CONSIDERED LOW RISK FOR COVID-19.  What to do if you are HIGH RISK for COVID-19?  Marland Kitchen If you are having a medical emergency, call 911. . Seek medical care right away. Before you go to a doctor's office, urgent care or emergency department, call ahead and tell them about your recent travel, contact with someone diagnosed with COVID-19, and your symptoms. You should receive instructions from your physician's office regarding next steps of care.  . When you arrive at healthcare provider, tell the healthcare staff immediately you have returned from visiting [place], [place], [place], [place] or [place]; or traveled in the [place] to [place], [place], [place], or [place];  in the last two weeks or you have been in close contact with a person diagnosed with COVID-19 in the last 2 weeks.   . Tell the health care staff about your symptoms: fever, cough and shortness of breath. . After you have been seen by a medical provider, you will be either: o Tested for (COVID-19) and discharged home on quarantine except to seek medical care if symptoms worsen, and asked to  - Stay home and avoid contact with others until you get your results (4-5 days)  - Avoid travel on public transportation if possible (such as bus, train, or airplane) or o Sent to the Emergency Department by EMS for evaluation, COVID-19 testing, and  possible admission depending on your condition and test results.  What to do if you are LOW RISK for COVID-19?  Reduce your risk of any infection by using the same precautions used for avoiding the common cold or flu:  Marland Kitchen Wash your hands often with soap and warm water for at least 20 seconds.  If soap and water are not readily available, use an alcohol-based hand sanitizer with at least 60% alcohol.  . If coughing or sneezing, cover your mouth and nose by coughing or sneezing into the elbow areas of your shirt or coat, into a tissue or into your sleeve (not your hands). . Avoid shaking hands with others and consider head nods or verbal greetings only. . Avoid touching your eyes, nose, or mouth with unwashed hands.  . Avoid close contact with people who are sick. . Avoid places or events with large numbers of people in one location, like concerts or sporting events. . Carefully consider travel plans you have or are making. . If you are planning any travel outside or inside the [place], visit the CDC's Travelers' Health webpage for the latest health notices. . If you have some symptoms but not all symptoms, continue to monitor at home and seek medical attention if your symptoms worsen. . If you are having a medical emergency, call 911.   [place] / e-Visit: [email or web address]         [facility]: [facility]: [phone]                   [facility]: [phone]

## 2019-03-23 NOTE — Progress Notes (Signed)
HEMATOLOGY/ONCOLOGY CLINIC NOTE  Date of Service: 03/28/2019  Patient Care Team: [name], MD as PCP - General (Family Medicine)  CHIEF COMPLAINTS/PURPOSE OF CONSULTATION:  DLBCL (diffuse large B cell lymphoma)   HISTORY OF PRESENTING ILLNESS:  Denise Walls is a wonderful 58 y.o. female with a past medical history significant for anxiety, Crohn's disease, hypertension, hyperlipidemia, migraine headaches.  The patient's history is obtained through a Spanish interpreter via video, [place].  The patient reports that she has history of Crohn's disease, for which she has been taking mesalamine with very good control of her symptoms. She was previously followed by Dr. Kathy Breach gastroenterology and to transition her care to a different gastroenterologist after Dr.Guptamoved to [facility]. Her new gastroenterologist stopped her mesalamine some time in 2019,and soon after that, she began to develop new onset left lower quadrant discomfort/pain,nonradiating, dull, intermittent, mild to moderate in intensity, exacerbated by eating and improved with defecation. She underwent colonoscopy, which was unremarkable, and was recommended to modify her diet without any improvement. She then re-established her care with Dr. Etter Sjogren ordered CT abdomen/pelvis that showed diffuse abdominal lymphadenopathy. PET showed diffuse lymph node involvement in the neck, chest, abdomen and the pelvis,and patient was referred to oncology for further evaluation.  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 The patient initially received 1 cycle of R-CHOP which she tolerated fairly well with exception of hyponatremia and neutropenia.  Prior to her second planned cycle of chemotherapy, she was found to be positive for Myc and BCL-2 rearrangement.  She is now receiving  R-EPOCH and is here for cycle #2 of his  chemotherapy.  Today, the patient reports that she is feeling well overall.  She reports a very good appetite and has been eating a lot at home.  She reports that her breathing is stable and maybe even slightly improved.  Had an episode of dizziness and vomiting and was seen in the ER last week. Symptoms have now resolved. Today, she denies any dizziness, headaches, chest discomfort, cough.  Denies abdominal pain, nausea, vomiting, constipation, diarrhea.  Denies bleeding.  The patient is here for admission for cycle #2 of R-EPOCH.  INTERVAL HISTORY:   Denise Walls is a wonderful 58 y.o. female who is here for evaluation and management of DLBCL. The patient's last visit with [place] was on 03/07/2019. The pt reports that she is doing well overall.  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 It is worse in her hands than her feet and is more intense in her fingertips and toes. Pt is currently taking pre-natal vitamins, potassium and B-complex vitamins. She does have some mouth sores but they are not too bothersome as she uses a mouth wash that helps her a lot. Pt is experiencing hunger most of the time and has been eating well. She reports left-sided jaw pain but denies any specific dental concerns or pain. She also became stiff the other day after walking vigorously beforehand.   Lab results today (03/28/19) of CBC w/diff and CMP is as follows: all values are WNL except for WBC at 37.8, RBC at 2.94, Hgb at 8.8, HCT at 27.1, RDW at 20.7, nRBC at 0.5, Neutro Abs at 26.2K, Mono Abs at 4.6K, Abs Immature Granulocytes at 5.80K, BUN at 5, Total Protein at 6.1, Alkaline Phosphatase at 157, Total Bilirubin at  0.2, Polychromasia is "Present", Ovalocytes are "Present". 03/28/2019 Magnesium at 1.8  On review of systems, pt reports numbness/tingling in hands/feet, mouth sores, eating well, left-sided jaw pain and  denies fevers, chills, port issues, abdominal pain and any other symptoms.   MEDICAL HISTORY:  Past Medical History:  Diagnosis Date   Acute cystitis    Anxiety    Arthritis    Crohn disease ([place])    dx 2004, history of small bowel obstruction 02/2007 treated conservatively   History of colon polyps    History of shingles    HTN (hypertension)    Hypercholesterolemia    Insomnia    Migraine    Uterine fibroid     SURGICAL HISTORY: Past Surgical History:  Procedure Laterality Date   COLONOSCOPY  09/11/2014   Small internal hemorrhoids. Otherwise normal colonoscopy to terminal ileum   COLONOSCOPY  10/02/2017   ESOPHAGOGASTRODUODENOSCOPY  04/05/2007   Mild gastritis. Otherwise, normal esophagogastroduodenoscopy   IR IMAGING GUIDED PORT INSERTION  11/29/2018   LYMPH NODE BIOPSY Left 12/09/2018   Procedure: LEFT DEEP CERVICAL LYMPH NODE BIOPSY;  Surgeon: Fanny Skates, MD;  Location: [place];  Service: General;  Laterality: Left;   TUBAL LIGATION      SOCIAL HISTORY: Social History   Socioeconomic History   Marital status: Married    Spouse name: Not on file   Number of children: Not on file   Years of education: Not on file   Highest education level: Not on file  Occupational History   Not on file  Social Needs   Financial resource strain: Not on file   Food insecurity    Worry: Not on file    Inability: Not on file   Transportation needs    Medical: Not on file    Non-medical: Not on file  Tobacco Use   Smoking status: Never Smoker   Smokeless tobacco: Never Used  Substance and Sexual Activity   Alcohol use: Never    Frequency: Never   Drug use: Never   Sexual activity: Yes    Birth control/protection: Post-menopausal  Lifestyle   Physical activity    Days per week: Not on file    Minutes per session: Not on file   Stress: Not on file  Relationships   Social connections    Talks on phone: Not on file    Gets together:  Not on file    Attends religious service: Not on file    Active member of club or organization: Not on file    Attends meetings of clubs or organizations: Not on file    Relationship status: Not on file   Intimate partner violence    Fear of current or ex partner: Not on file    Emotionally abused: Not on file    Physically abused: Not on file    Forced sexual activity: Not on file  Other Topics Concern   Not on file  Social History Narrative   Not on file    FAMILY HISTORY: Family History  Problem Relation Age of Onset   Colon cancer Neg Hx     ALLERGIES:  is allergic to nsaids.  MEDICATIONS:  Current Outpatient Medications  Medication Sig Dispense Refill   acetaminophen (TYLENOL) 325 MG tablet Take 325-650 mg by mouth every 6 (six) hours as needed for mild pain.     B Complex Vitamins (B COMPLEX PO) Take 1 tablet by mouth daily.     calcium carbonate (TUMS - DOSED IN  MG ELEMENTAL CALCIUM) 500 MG chewable tablet Chew 2 tablets by mouth daily as needed for indigestion or heartburn.     cholecalciferol (VITAMIN D3) 25 MCG (1000 UT) tablet Take 1 tablet (1,000 Units total) by mouth daily. 30 tablet 3   lidocaine-prilocaine (EMLA) cream Apply to affected area once (Patient taking differently: Apply 1 application topically as needed (For port-a-cath.). ) 30 g 3   meclizine (ANTIVERT) 25 MG tablet Take 1 tablet (25 mg total) by mouth 3 (three) times daily as needed for dizziness. (Patient not taking: Reported on 01/31/2019) 30 tablet 0   mesalamine (LIALDA) 1.2 g EC tablet Take 2.4 g by mouth daily.      ondansetron (ZOFRAN) 8 MG tablet Take 1 tablet (8 mg total) by mouth 2 (two) times daily as needed for refractory nausea / vomiting. Start on day 3 after cyclophosphamide chemotherapy. 30 tablet 1   polyethylene glycol (MIRALAX / GLYCOLAX) 17 g packet Take 17 g by mouth daily as needed for mild constipation.     potassium chloride SA (K-DUR) 20 MEQ tablet Take 1 tablet (20  mEq total) by mouth daily. 30 tablet 1   Prenatal Vit-Fe Fumarate-FA (MULTIVITAMIN-PRENATAL) 27-0.8 MG TABS tablet Take 1 tablet by mouth daily at 12 noon.     traZODone (DESYREL) 50 MG tablet Take 1 tablet (50 mg total) by mouth at bedtime as needed for sleep. (Patient taking differently: Take 25 mg by mouth at bedtime as needed for sleep. ) 30 tablet 3   No current facility-administered medications for this visit.     REVIEW OF SYSTEMS:   A 10+ POINT REVIEW OF SYSTEMS WAS OBTAINED including neurology, dermatology, psychiatry, cardiac, respiratory, lymph, extremities, GI, GU, Musculoskeletal, constitutional, breasts, reproductive, HEENT.  All pertinent positives are noted in the HPI.  All others are negative.   PHYSICAL EXAMINATION: ECOG PERFORMANCE STATUS: 2 - Symptomatic, <50% confined to bed  . Vitals:   03/28/19 0936  BP: 114/68  Pulse: 93  Resp: 18  Temp: 98.2 F (36.8 C)  SpO2: 99%   Filed Weights   03/28/19 0936  Weight: 131 lb 14.4 oz (59.8 kg)   .Body mass index is 25.76 kg/m.  GENERAL:alert, in no acute distress and comfortable SKIN: no acute rashes, no significant lesions EYES: conjunctiva are pink and non-injected, sclera anicteric OROPHARYNX: MMM, no exudates, no oropharyngeal erythema or ulceration NECK: supple, no JVD LYMPH:  no palpable lymphadenopathy in the cervical, axillary or inguinal regions LUNGS: clear to auscultation b/l with normal respiratory effort HEART: regular rate & rhythm ABDOMEN:  normoactive bowel sounds , non tender, not distended. No palpable hepatosplenomegaly.  Extremity: no pedal edema PSYCH: alert & oriented x 3 with fluent speech NEURO: no focal motor/sensory deficits  LABORATORY DATA:  I have reviewed the data as listed  . CBC Latest Ref Rng & Units 03/28/2019 03/18/2019 03/17/2019  WBC 4.0 - 10.5 K/uL 37.8(H) 2.1(L) 4.7  Hemoglobin 12.0 - 15.0 g/dL 8.8(L) 8.7(L) 8.9(L)  Hematocrit 36.0 - 46.0 % 27.1(L) 27.0(L) 28.3(L)    Platelets 150 - 400 K/uL 150 332 380    . CMP Latest Ref Rng & Units 03/28/2019 03/18/2019 03/17/2019  Glucose 70 - 99 mg/dL 98 100(H) 153(H)  BUN 6 - 20 mg/dL 5(L) 14 14  Creatinine 0.44 - 1.00 mg/dL 0.72 0.60 0.59  Sodium 135 - 145 mmol/L 139 138 136  Potassium 3.5 - 5.1 mmol/L 4.2 3.9 3.5  Chloride 98 - 111 mmol/L 102 107 104  CO2 22 -  32 mmol/L 26 23 22   Calcium 8.9 - 10.3 mg/dL 9.0 8.7(L) 8.6(L)  Total Protein 6.5 - 8.1 g/dL 6.1(L) 5.5(L) 5.8(L)  Total Bilirubin 0.3 - 1.2 mg/dL 0.2(L) 0.4 0.4  Alkaline Phos 38 - 126 U/L 157(H) 60 65  AST 15 - 41 U/L 21 18 24   ALT 0 - 44 U/L 22 35 30     RADIOGRAPHIC STUDIES: I have personally reviewed the radiological images as listed and agreed with the findings in the report. Dg Fluoro Guide Lumbar Puncture  Result Date: 03/15/2019 CLINICAL DATA:  Fluoro guided intrathecal methotrexate for CNS prophylaxis in the setting of lymphoma. EXAM: Fluoroscopic guided lumbar puncture. Injection of intrathecal chemotherapy. FLUOROSCOPY TIME:  Fluoroscopy Time:  1 minutes Radiation Exposure Index (if provided by the fluoroscopic device): 5.9 mGy Number of Acquired Spot Images: 0 COMPARISON:  02/23/2019 FINDINGS: Informed consent was obtained from the patient prior to the procedure with the help of an interpreter. The risks including headache, allergy and pain were discussed. A time-out was performed and this was also checked against the patient's chemotherapy from the pharmacy. With the patient in the prone position the lower back was prepped and draped with Betadine. 1% lidocaine was utilized for local anesthesia over the site. Lumbar puncture was performed at the L4-5 level with a 3.5 inch 20 gauge needle. After clear CSF flow was confirmed injection of 10 mL chemotherapy mixture of 12 mg methotrexate and 50 mg Solu-Cortef was injected into the space. The patient tolerated the procedure well. Post procedure orders were placed, short note was entered into the  chart and the patient was turned to the inpatient floor in stable condition. IMPRESSION: Successful lumbar puncture at L4-5 with injection of intrathecal chemotherapy. Electronically Signed   By: Zetta Bills M.D.   On: 03/15/2019 16:37    ASSESSMENT & PLAN:   1. 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 The intent is for cure. -We discussed some of the risks, benefitsandside-effects of Rituximab,Etoposide, Vincristine, Adriamycin,Cytoxan,Prednisone.The regimen requires inpatient admission due to continuous chemotherapy infusion  -Some of the short term side-effects included, though not limited to, risk of fatigue, weight loss,tumor lysis syndrome, risk of allergic reactions,pancytopenia, life-threatening infections, need for transfusions of blood products, nausea, vomiting, change in bowel habits,hairloss, risk of congestive heart failure, admission to hospital for various reasons, and risks of death.  -Long term side-effects are also discussed includingpermanent damage to nerve function, chronic fatigue, and rare secondary malignancy including bone marrow disorders. -The patient is aware that the response rates discussed earlier is not guaranteed. -After a long discussion, patient made an informed decision to proceed with the prescribed plan of care. -She has antiemetics available to her  02/14/2019 PET Scan Whole Body ([id number]) revealed "1. Findings favor complete metabolic response. No residual hypermetabolism within the lymph nodes of the neck, chest, abdomen or pelvis, which have all decreased in size  in the interval. 2. Nonspecific new diffuse skeletal hypermetabolism and new mild splenic hypermetabolism, favor reactive state of the marrow and reticuloendothelial system. Spleen is normal size. Continued surveillance with CT or PET-CT advised. 3. Small layering left pleural effusion, decreased. 4.  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 Normocytic anemia -Secondary to anemia of chronic disease  -Hgb is 9.3 today -Patient denies any symptoms of bleeding -We will monitor it for now   4. Leukocytosis -related to G-CSF -no symptoms suggestive of infection.  5. Hyponatremia -AY847 today -Continue Pedialyte and gentle salt addition to the diet, and maintain adequate hydration -We will monitor it closely  6. Hypokalemia -K3.6 today -Has been on potassium chloride 20 mEq daily as an outpatient in the past. Will hold off on ordering for now. Monitor K+ daily.   7. Chemotherapy-associated nausea  -Secondary to  chemotherapy -Symptoms relatively well controlled  -Continue PRN-anti-emetics   FOLLOW UP: -Plz schedule inpatient [facility] chemotherapy C5 from 04/04/2019 for 5 days -outpatient Rituxan and Neulasta on 04/11/2019 -MD visit with Dr Irene Limbo on 04/18/2019 with labs  The total time spent in the appt was 25 minutes and more than 50% was on counseling and direct patient cares.  All of the patient's questions were answered with apparent satisfaction. The patient knows to call the clinic with any problems, questions or concerns.   Sullivan Lone MD [place] AAHIVMS [facility] Hematology/Oncology Physician [facility]  (Office):       [phone] (Work cell):  [phone] (Fax):           [phone]  03/28/2019 10:11 AM  I, Yevette Edwards, am acting as a scribe for Dr. Sullivan Lone.   .I have reviewed the above documentation for accuracy and completeness, and I agree with the above. [name] MD

## 2019-03-28 ENCOUNTER — Telehealth: Payer: Self-pay

## 2019-03-28 ENCOUNTER — Other Ambulatory Visit: Payer: Self-pay

## 2019-03-28 ENCOUNTER — Inpatient Hospital Stay: Payer: Commercial Managed Care - PPO

## 2019-03-28 ENCOUNTER — Inpatient Hospital Stay (HOSPITAL_BASED_OUTPATIENT_CLINIC_OR_DEPARTMENT_OTHER): Payer: Commercial Managed Care - PPO | Admitting: Hematology

## 2019-03-28 VITALS — BP 114/68 | HR 93 | Temp 98.2°F | Resp 18 | Ht 60.0 in | Wt 131.9 lb

## 2019-03-28 DIAGNOSIS — C8339 Diffuse large B-cell lymphoma, extranodal and solid organ sites: Secondary | ICD-10-CM

## 2019-03-28 DIAGNOSIS — Z5112 Encounter for antineoplastic immunotherapy: Secondary | ICD-10-CM | POA: Diagnosis not present

## 2019-03-28 DIAGNOSIS — C83398 Diffuse large b-cell lymphoma of other extranodal and solid organ sites: Secondary | ICD-10-CM

## 2019-03-28 DIAGNOSIS — J910 Malignant pleural effusion: Secondary | ICD-10-CM

## 2019-03-28 LAB — CMP (CANCER CENTER ONLY)
ALT: 22 U/L (ref 0–44)
AST: 21 U/L (ref 15–41)
Albumin: 3.7 g/dL (ref 3.5–5.0)
Alkaline Phosphatase: 157 U/L — ABNORMAL HIGH (ref 38–126)
Anion gap: 11 (ref 5–15)
BUN: 5 mg/dL — ABNORMAL LOW (ref 6–20)
CO2: 26 mmol/L (ref 22–32)
Calcium: 9 mg/dL (ref 8.9–10.3)
Chloride: 102 mmol/L (ref 98–111)
Creatinine: 0.72 mg/dL (ref 0.44–1.00)
GFR, Est AFR Am: >60 mL/min (ref >60)
GFR, Estimated: >60 mL/min (ref >60)
Glucose, Bld: 98 mg/dL (ref 70–99)
Potassium: 4.2 mmol/L (ref 3.5–5.1)
Sodium: 139 mmol/L (ref 135–145)
Total Bilirubin: 0.2 mg/dL — ABNORMAL LOW (ref 0.3–1.2)
Total Protein: 6.1 g/dL — ABNORMAL LOW (ref 6.5–8.1)

## 2019-03-28 LAB — CBC WITH DIFFERENTIAL/PLATELET
Abs Immature Granulocytes: 5.8 10*3/uL — ABNORMAL HIGH (ref 0.00–0.07)
Basophils Absolute: 0.1 10*3/uL (ref 0.0–0.1)
Basophils Relative: 0 %
Eosinophils Absolute: 0 10*3/uL (ref 0.0–0.5)
Eosinophils Relative: 0 %
HCT: 27.1 % — ABNORMAL LOW (ref 36.0–46.0)
Hemoglobin: 8.8 g/dL — ABNORMAL LOW (ref 12.0–15.0)
Immature Granulocytes: 15 %
Lymphocytes Relative: 3 %
Lymphs Abs: 1.1 10*3/uL (ref 0.7–4.0)
MCH: 29.9 pg (ref 26.0–34.0)
MCHC: 32.5 g/dL (ref 30.0–36.0)
MCV: 92.2 fL (ref 80.0–100.0)
Monocytes Absolute: 4.6 10*3/uL — ABNORMAL HIGH (ref 0.1–1.0)
Monocytes Relative: 12 %
Neutro Abs: 26.2 10*3/uL — ABNORMAL HIGH (ref 1.7–7.7)
Neutrophils Relative %: 70 %
Platelets: 150 10*3/uL (ref 150–400)
RBC: 2.94 MIL/uL — ABNORMAL LOW (ref 3.87–5.11)
RDW: 20.7 % — ABNORMAL HIGH (ref 11.5–15.5)
WBC: 37.8 10*3/uL — ABNORMAL HIGH (ref 4.0–10.5)
nRBC: 0.5 % — ABNORMAL HIGH (ref 0.0–0.2)

## 2019-03-28 LAB — MAGNESIUM: Magnesium: 1.8 mg/dL (ref 1.7–2.4)

## 2019-03-28 NOTE — Telephone Encounter (Signed)
Per Dr. Irene Limbo patient needs to be set up for inpatient [facility] starting on 04/04/2019 with COVID testing prior to admission. Spoke to [facility] in bed placement and she set patient up for admission on Monday 04/04/19. Bed placement will call patient's daughter Yasmin with bed assignment at [phone] on Monday. Patient made aware through interpreter Almyra Free that she needs to go to [facility] on Friday 04/01/19 for a COIVD test. Also made patient's daughter Yasmin aware that patient's needs a COVID test on Friday prior to admission. Email sent to inpatient chemo box to make all aware of upcoming admission.

## 2019-03-29 ENCOUNTER — Telehealth: Payer: Self-pay | Admitting: Hematology

## 2019-03-29 NOTE — Telephone Encounter (Signed)
Scheduled appt per 11/9 los.  Spoke with pt daughter and she is aware of the appt date and time.

## 2019-04-01 ENCOUNTER — Other Ambulatory Visit (HOSPITAL_COMMUNITY)
Admission: RE | Admit: 2019-04-01 | Discharge: 2019-04-01 | Disposition: A | Discharge status: Home / Self Care | Payer: Commercial Managed Care - PPO | Source: Ambulatory Visit | Attending: Hematology | Admitting: Hematology

## 2019-04-01 DIAGNOSIS — Z01812 Encounter for preprocedural laboratory examination: Secondary | ICD-10-CM | POA: Insufficient documentation

## 2019-04-01 DIAGNOSIS — Z20828 Contact with and (suspected) exposure to other viral communicable diseases: Secondary | ICD-10-CM | POA: Insufficient documentation

## 2019-04-02 LAB — SARS CORONAVIRUS 2 (TAT 6-24 HRS): SARS Coronavirus 2: NEGATIVE

## 2019-04-04 ENCOUNTER — Inpatient Hospital Stay (HOSPITAL_COMMUNITY)
Admission: RE | Admit: 2019-04-04 | Discharge: 2019-04-08 | DRG: 847 | Disposition: A | Discharge status: Home / Self Care | Payer: Commercial Managed Care - PPO | Attending: Hematology | Admitting: Hematology

## 2019-04-04 ENCOUNTER — Encounter (HOSPITAL_COMMUNITY): Payer: Self-pay | Admitting: Oncology

## 2019-04-04 ENCOUNTER — Other Ambulatory Visit: Payer: Self-pay

## 2019-04-04 DIAGNOSIS — E7800 Pure hypercholesterolemia, unspecified: Secondary | ICD-10-CM | POA: Diagnosis present

## 2019-04-04 DIAGNOSIS — G629 Polyneuropathy, unspecified: Secondary | ICD-10-CM | POA: Diagnosis present

## 2019-04-04 DIAGNOSIS — D6481 Anemia due to antineoplastic chemotherapy: Secondary | ICD-10-CM | POA: Diagnosis present

## 2019-04-04 DIAGNOSIS — D638 Anemia in other chronic diseases classified elsewhere: Secondary | ICD-10-CM | POA: Diagnosis present

## 2019-04-04 DIAGNOSIS — Z8719 Personal history of other diseases of the digestive system: Secondary | ICD-10-CM | POA: Diagnosis not present

## 2019-04-04 DIAGNOSIS — C8333 Diffuse large B-cell lymphoma, intra-abdominal lymph nodes: Secondary | ICD-10-CM | POA: Diagnosis present

## 2019-04-04 DIAGNOSIS — Z5111 Encounter for antineoplastic chemotherapy: Principal | ICD-10-CM

## 2019-04-04 DIAGNOSIS — C8339 Diffuse large B-cell lymphoma, extranodal and solid organ sites: Secondary | ICD-10-CM | POA: Diagnosis not present

## 2019-04-04 DIAGNOSIS — Z886 Allergy status to analgesic agent status: Secondary | ICD-10-CM | POA: Diagnosis not present

## 2019-04-04 DIAGNOSIS — D72828 Other elevated white blood cell count: Secondary | ICD-10-CM | POA: Diagnosis present

## 2019-04-04 DIAGNOSIS — E785 Hyperlipidemia, unspecified: Secondary | ICD-10-CM | POA: Diagnosis present

## 2019-04-04 DIAGNOSIS — C8330 Diffuse large B-cell lymphoma, unspecified site: Secondary | ICD-10-CM

## 2019-04-04 DIAGNOSIS — K5090 Crohn's disease, unspecified, without complications: Secondary | ICD-10-CM | POA: Diagnosis present

## 2019-04-04 DIAGNOSIS — F419 Anxiety disorder, unspecified: Secondary | ICD-10-CM | POA: Diagnosis present

## 2019-04-04 DIAGNOSIS — E871 Hypo-osmolality and hyponatremia: Secondary | ICD-10-CM | POA: Diagnosis present

## 2019-04-04 DIAGNOSIS — J910 Malignant pleural effusion: Secondary | ICD-10-CM | POA: Diagnosis present

## 2019-04-04 DIAGNOSIS — I10 Essential (primary) hypertension: Secondary | ICD-10-CM | POA: Diagnosis present

## 2019-04-04 DIAGNOSIS — T451X5A Adverse effect of antineoplastic and immunosuppressive drugs, initial encounter: Secondary | ICD-10-CM | POA: Diagnosis present

## 2019-04-04 DIAGNOSIS — E876 Hypokalemia: Secondary | ICD-10-CM | POA: Diagnosis present

## 2019-04-04 DIAGNOSIS — Z20828 Contact with and (suspected) exposure to other viral communicable diseases: Secondary | ICD-10-CM | POA: Diagnosis present

## 2019-04-04 DIAGNOSIS — C83398 Diffuse large b-cell lymphoma of other extranodal and solid organ sites: Secondary | ICD-10-CM

## 2019-04-04 DIAGNOSIS — C8338 Diffuse large B-cell lymphoma, lymph nodes of multiple sites: Secondary | ICD-10-CM

## 2019-04-04 LAB — COMPREHENSIVE METABOLIC PANEL
ALT: 25 U/L (ref 0–44)
AST: 18 U/L (ref 15–41)
Albumin: 3.7 g/dL (ref 3.5–5.0)
Alkaline Phosphatase: 91 U/L (ref 38–126)
Anion gap: 8 (ref 5–15)
BUN: 15 mg/dL (ref 6–20)
CO2: 26 mmol/L (ref 22–32)
Calcium: 9 mg/dL (ref 8.9–10.3)
Chloride: 100 mmol/L (ref 98–111)
Creatinine, Ser: 0.59 mg/dL (ref 0.44–1.00)
GFR calc Af Amer: >60 mL/min (ref >60)
GFR calc non Af Amer: >60 mL/min (ref >60)
Glucose, Bld: 90 mg/dL (ref 70–99)
Potassium: 3.8 mmol/L (ref 3.5–5.1)
Sodium: 134 mmol/L — ABNORMAL LOW (ref 135–145)
Total Bilirubin: 0.3 mg/dL (ref 0.3–1.2)
Total Protein: 6.3 g/dL — ABNORMAL LOW (ref 6.5–8.1)

## 2019-04-04 LAB — CBC WITH DIFFERENTIAL/PLATELET
Abs Immature Granulocytes: 0.07 10*3/uL (ref 0.00–0.07)
Basophils Absolute: 0.1 10*3/uL (ref 0.0–0.1)
Basophils Relative: 1 %
Eosinophils Absolute: 0 10*3/uL (ref 0.0–0.5)
Eosinophils Relative: 0 %
HCT: 29.5 % — ABNORMAL LOW (ref 36.0–46.0)
Hemoglobin: 9.3 g/dL — ABNORMAL LOW (ref 12.0–15.0)
Immature Granulocytes: 1 %
Lymphocytes Relative: 8 %
Lymphs Abs: 0.8 10*3/uL (ref 0.7–4.0)
MCH: 30.5 pg (ref 26.0–34.0)
MCHC: 31.5 g/dL (ref 30.0–36.0)
MCV: 96.7 fL (ref 80.0–100.0)
Monocytes Absolute: 0.8 10*3/uL (ref 0.1–1.0)
Monocytes Relative: 8 %
Neutro Abs: 8.8 10*3/uL — ABNORMAL HIGH (ref 1.7–7.7)
Neutrophils Relative %: 82 %
Platelets: 304 10*3/uL (ref 150–400)
RBC: 3.05 MIL/uL — ABNORMAL LOW (ref 3.87–5.11)
RDW: 19.9 % — ABNORMAL HIGH (ref 11.5–15.5)
WBC: 10.6 10*3/uL — ABNORMAL HIGH (ref 4.0–10.5)
nRBC: 0 % (ref 0.0–0.2)

## 2019-04-04 MED ORDER — TRAZODONE HCL 50 MG PO TABS: 25.0000 mg | ORAL_TABLET | Freq: Every evening | ORAL | Status: DC | PRN

## 2019-04-04 MED ORDER — PREDNISONE 20 MG PO TABS
60.0000 mg | ORAL_TABLET | Freq: Every day | ORAL | Status: AC
  Administered 2019-04-04 – 2019-04-08 (×5): 60 mg via ORAL
  Filled 2019-04-04 (×5): qty 3

## 2019-04-04 MED ORDER — SODIUM CHLORIDE 0.9% FLUSH
10.0000 mL | Freq: Two times a day (BID) | INTRAVENOUS | Status: DC
  Administered 2019-04-04 – 2019-04-07 (×6): 10 mL

## 2019-04-04 MED ORDER — SODIUM CHLORIDE 0.9 % IV SOLN
INTRAVENOUS | Status: DC
  Administered 2019-04-04: 14:00:00 via INTRAVENOUS

## 2019-04-04 MED ORDER — SODIUM CHLORIDE 0.9% FLUSH: 10.0000 mL | INTRAVENOUS | Status: DC | PRN

## 2019-04-04 MED ORDER — CHLORHEXIDINE GLUCONATE CLOTH 2 % EX PADS
6.0000 | MEDICATED_PAD | Freq: Every day | CUTANEOUS | Status: DC
  Administered 2019-04-04 – 2019-04-08 (×5): 6 via TOPICAL

## 2019-04-04 MED ORDER — MESALAMINE 1.2 G PO TBEC
2.4000 g | DELAYED_RELEASE_TABLET | Freq: Every day | ORAL | Status: DC
  Administered 2019-04-04 – 2019-04-08 (×5): 2.4 g via ORAL
  Filled 2019-04-04 (×5): qty 2

## 2019-04-04 MED ORDER — SODIUM CHLORIDE 0.9 % IV SOLN
Freq: Once | INTRAVENOUS | Status: AC
  Administered 2019-04-04: 14:00:00 18 mg via INTRAVENOUS
  Filled 2019-04-04: qty 4

## 2019-04-04 MED ORDER — PRENATAL 27-0.8 MG PO TABS
1.0000 | ORAL_TABLET | Freq: Every day | ORAL | Status: DC
  Filled 2019-04-04: qty 1

## 2019-04-04 MED ORDER — VINCRISTINE SULFATE CHEMO INJECTION 1 MG/ML
Freq: Once | INTRAVENOUS | Status: AC
  Administered 2019-04-04: 15:00:00 via INTRAVENOUS
  Filled 2019-04-04: qty 8

## 2019-04-04 MED ORDER — POTASSIUM CHLORIDE CRYS ER 20 MEQ PO TBCR
20.0000 meq | EXTENDED_RELEASE_TABLET | Freq: Every day | ORAL | Status: DC
  Administered 2019-04-04 – 2019-04-08 (×5): 20 meq via ORAL
  Filled 2019-04-04 (×5): qty 1

## 2019-04-04 MED ORDER — B COMPLEX-C PO TABS
1.0000 | ORAL_TABLET | Freq: Every day | ORAL | Status: DC
  Administered 2019-04-04 – 2019-04-08 (×5): 1 via ORAL
  Filled 2019-04-04 (×5): qty 1

## 2019-04-04 MED ORDER — CALCIUM CARBONATE ANTACID 500 MG PO CHEW: 2.0000 | CHEWABLE_TABLET | Freq: Every day | ORAL | Status: DC | PRN

## 2019-04-04 MED ORDER — HOT PACK MISC ONCOLOGY
1.0000 | Freq: Once | Status: DC | PRN
  Filled 2019-04-04: qty 1

## 2019-04-04 MED ORDER — PRENATAL MULTIVITAMIN CH
1.0000 | ORAL_TABLET | Freq: Every day | ORAL | Status: DC
  Administered 2019-04-04 – 2019-04-08 (×5): 1 via ORAL
  Filled 2019-04-04 (×5): qty 1

## 2019-04-04 MED ORDER — ONDANSETRON HCL 8 MG PO TABS: 8.0000 mg | ORAL_TABLET | Freq: Two times a day (BID) | ORAL | Status: DC | PRN

## 2019-04-04 MED ORDER — B COMPLEX PO TABS: 1.0000 | ORAL_TABLET | Freq: Every day | ORAL | Status: DC

## 2019-04-04 MED ORDER — MECLIZINE HCL 25 MG PO TABS: 25.0000 mg | ORAL_TABLET | Freq: Three times a day (TID) | ORAL | Status: DC | PRN

## 2019-04-04 MED ORDER — VITAMIN D3 25 MCG PO TABS
1000.0000 [IU] | ORAL_TABLET | Freq: Every day | ORAL | Status: DC
  Administered 2019-04-04: 11:00:00 1000 [IU] via ORAL
  Filled 2019-04-04 (×3): qty 1

## 2019-04-04 MED ORDER — LIDOCAINE-PRILOCAINE 2.5-2.5 % EX CREA: 1.0000 "application " | TOPICAL_CREAM | CUTANEOUS | Status: DC | PRN

## 2019-04-04 MED ORDER — COLD PACK MISC ONCOLOGY
1.0000 | Freq: Once | Status: DC | PRN
  Filled 2019-04-04: qty 1

## 2019-04-04 MED ORDER — POLYETHYLENE GLYCOL 3350 17 G PO PACK: 17.0000 g | PACK | Freq: Every day | ORAL | Status: DC | PRN

## 2019-04-04 MED ORDER — ACETAMINOPHEN 325 MG PO TABS: 325.0000 mg | ORAL_TABLET | Freq: Four times a day (QID) | ORAL | Status: DC | PRN

## 2019-04-04 NOTE — Progress Notes (Signed)
Patient speaks Spanish.  Completed Admission Navigator with patient via daughter as the interpreter

## 2019-04-04 NOTE — H&P (Addendum)
[place]  [phone] [phone]   MEDICAL ONCOLOGY - HISTORY & PHYSICAL  CHIEF COMPLAINT: Admission for cycle 5 R-EPOCH for diffuse large B-cell lymphoma  HPI: Ms. Primm is a 58 year old female with a past medical history significant for anxiety, Crohn's disease, hypertension, hyperlipidemia, migraine headaches. The patient reports that she has history of Crohn's disease, for which she has been taking mesalamine with very good control of her symptoms. She was previously followed by Dr. Kathy Breach gastroenterology and to transition her care to a different gastroenterologist after Dr.Guptamoved to [facility]. Her new gastroenterologist stopped her mesalamine some time in 2019,and soon after that, she began to develop new onset left lower quadrant discomfort/pain,nonradiating, dull, intermittent, mild to moderate in intensity, exacerbated by eating and improved with defecation. She underwent colonoscopy, which was unremarkable, and was recommended to modify her diet without any improvement. She then re-established her care with Dr. Etter Sjogren ordered CT abdomen/pelvis that showed diffuse abdominal lymphadenopathy. PET showed diffuse lymph node involvement in the neck, chest, abdomen and the pelvis,and patient was referred to oncology for further evaluation.  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 The patient initially received 1 cycle of R-CHOP which she tolerated fairly well with exception of hyponatremia and neutropenia.  Prior to her second planned cycle of chemotherapy, she was found to be positive for Myc and BCL-2 rearrangement.  She is now receiving  R-EPOCH and is here for cycle #5 of this chemotherapy.   Today, the patient reports that she is feeling well overall.  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  Appetite  has been good.  Denies mucositis.  Denies headaches and dizziness.  Reports stable breathing and no cough.  Denies chest pain.  Denies abdominal pain, nausea, vomiting, constipation, diarrhea.  Denies bleeding.  The patient is here for admission for cycle #5 of R-EPOCH.  COVID-19 testing negative on 04/01/2019.        Past Medical History:  Diagnosis Date  . Acute cystitis   . Anxiety   . Arthritis   . Crohn disease ([place])    dx 2004, history of small bowel obstruction 02/2007 treated conservatively  . History of colon polyps   . History of shingles   . HTN (hypertension)   . Hypercholesterolemia   . Insomnia   . Migraine   . Uterine fibroid                                 . L Past Surgical History:  Procedure Laterality Date  . COLONOSCOPY  09/11/2014   Small internal hemorrhoids. Otherwise normal colonoscopy to terminal ileum  . COLONOSCOPY  10/02/2017  . ESOPHAGOGASTRODUODENOSCOPY  04/05/2007   Mild gastritis. Otherwise, normal esophagogastroduodenoscopy  . IR IMAGING GUIDED PORT INSERTION  11/29/2018  . LYMPH NODE BIOPSY Left 12/09/2018   Procedure: LEFT DEEP CERVICAL LYMPH NODE BIOPSY;  Surgeon: Fanny Skates, MD;  Location: [facility];  Service: General;  Laterality: Left;  . TUBAL LIGATION    [place] NODE BIOPSY Left 12/09/2018   Procedure: LEFT DEEP CERVICAL LYMPH NODE BIOPSY;  Surgeon: Fanny Skates, MD;  Location: [facility];  Service: General;  Laterality: Left;  . TUBAL LIGATION      Medication Dose Route Frequency Provider Last Rate Last Dose  . acetaminophen (TYLENOL) tablet 325-650 mg  325-650 mg Oral Q6H PRN Maryanna Shape, NP      . b complex vitamins tablet 1 tablet  1 tablet Oral Daily Curcio, Kristin R, NP      . calcium carbonate (TUMS - dosed in mg elemental calcium) chewable tablet 400 mg of elemental calcium  2 tablet Oral Daily PRN Maryanna Shape, NP      . Chlorhexidine Gluconate Cloth 2 % PADS 6 each  6 each Topical Daily Curcio, Kristin R, NP       . lidocaine-prilocaine (EMLA) cream 1 application  1 application Topical PRN Curcio, Roselie Awkward, NP      . meclizine (ANTIVERT) tablet 25 mg  25 mg Oral TID PRN Maryanna Shape, NP      . mesalamine (LIALDA) EC tablet 2.4 g  2.4 g Oral Daily Curcio, Roselie Awkward, NP      . multivitamin-prenatal tablet 1 tablet  1 tablet Oral Q1200 Curcio, Kristin R, NP      . ondansetron (ZOFRAN) tablet 8 mg  8 mg Oral BID PRN Maryanna Shape, NP      . polyethylene glycol (MIRALAX / GLYCOLAX) packet 17 g  17 g Oral Daily PRN Curcio, Roselie Awkward, NP      . potassium chloride SA (KLOR-CON) CR tablet 20 mEq  20 mEq Oral Daily Curcio, Kristin R, NP      . sodium chloride flush (NS) 0.9 % injection 10-40 mL  10-40 mL Intracatheter Q12H Curcio, Kristin R, NP      . sodium chloride flush (NS) 0.9 % injection 10-40 mL  10-40 mL Intracatheter PRN Maryanna Shape, NP      . traZODone (DESYREL) tablet 25 mg  25 mg Oral QHS PRN Maryanna Shape, NP      . Vitamin D3 (Vitamin D) tablet 1,000 Units  1,000 Units Oral Daily Curcio, Roselie Awkward, NP        F Allergies  Allergen Reactions  . Nsaids Anaphylaxis and Other (See Comments)    Abdominal pain, GI Bleeding   : Ma Family History  Problem Relation Age of Onset  . Colon cancer Neg Hx   rried    Spouse name: Not on file  . Number of children: Not on file  . Years of education: Not on file  . Highest education level: Not on file  Occupational History  . Not on file  Social Needs  . Financial resource strain: Not on file  . Food insecurity    Worry: Not on file    Inability: Not on file  . Transportation needs    Medical: Not on file    Non-medical: Not on file  Tobacco Use  . Smoking status: Never Smoker  . Smokeless tobacco: Never Used  Substance and Sexual Activity  . Alcohol use: Never    Frequency: Never  . Drug use: Never  . Sexual activity: Yes    Birth control/protection: Post-menopausal  Lifestyle  . Physical activity    Days per week: Not  on file    Minutes per session: Not on file  . Stress: Not on file  Relationships  . Social Herbalist on phone: Not on file    Gets together: Not on file    Attends religious service: Not on file    Active member of club or organization: Not on file    Attends meetings of clubs or organizations: Not on file    Relationship status: Not  on file  . Intimate partner violence    Fear of current or ex partner: Not on file    Emotionally abused: Not on file    Physically abused: Not on file    Forced sexual activity: Not on file  Other Topics Concern  . Not on file  Social History Narrative  . Not on file  :  Review of Systems: A comprehensive 14 point review of systems was negative except as noted in the HPI.  Exam: BP 109/73 (BP Location: Left Arm)   Pulse (!) 105   Temp 98.5 F (36.9 C) (Oral)   Resp 20   LMP  (LMP Unknown)   SpO2 98%   General:  well-nourished in no acute distress.   Eyes:  no scleral icterus.   ENT:  There were no oropharyngeal lesions.   Neck was without thyromegaly.   Lymphatics:  Negative cervical, supraclavicular or axillary adenopathy.   Respiratory: lungs were clear bilaterally without wheezing or crackles.   Cardiovascular:  Regular rate and rhythm, S1/S2, without murmur, rub or gallop.  There was no pedal edema.   GI:  abdomen was soft, flat, nontender, nondistended, without organomegaly.   Musculoskeletal:  no spinal tenderness of palpation of vertebral spine.   Skin exam was without echymosis, petichae.   Neuro exam was nonfocal. Patient was alert and oriented.  Attention was good.   Language was appropriate.  Mood was normal without depression.  Speech was not pressured.  Thought content was not tangential.    CBC    Component Value Date/Time   WBC 10.6 (H) 04/04/2019 1026   RBC 3.05 (L) 04/04/2019 1026   HGB 9.3 (L) 04/04/2019 1026   HGB 8.9 (L) 03/07/2019 1418   HCT 29.5 (L) 04/04/2019 1026   PLT 304 04/04/2019 1026   PLT  193 03/07/2019 1418   MCV 96.7 04/04/2019 1026   MCH 30.5 04/04/2019 1026   MCHC 31.5 04/04/2019 1026   RDW 19.9 (H) 04/04/2019 1026   LYMPHSABS 0.8 04/04/2019 1026   MONOABS 0.8 04/04/2019 1026   EOSABS 0.0 04/04/2019 1026   BASOSABS 0.1 04/04/2019 1026   CMP Latest Ref Rng & Units 04/04/2019 03/28/2019 03/18/2019  Glucose 70 - 99 mg/dL 90 98 100(H)  BUN 6 - 20 mg/dL 15 5(L) 14  Creatinine 0.44 - 1.00 mg/dL 0.59 0.72 0.60  Sodium 135 - 145 mmol/L 134(L) 139 138  Potassium 3.5 - 5.1 mmol/L 3.8 4.2 3.9  Chloride 98 - 111 mmol/L 100 102 107  CO2 22 - 32 mmol/L 26 26 23   Calcium 8.9 - 10.3 mg/dL 9.0 9.0 8.7(L)  Total Protein 6.5 - 8.1 g/dL 6.3(L) 6.1(L) 5.5(L)  Total Bilirubin 0.3 - 1.2 mg/dL 0.3 0.2(L) 0.4  Alkaline Phos 38 - 126 U/L 91 157(H) 60  AST 15 - 41 U/L 18 21 18   ALT 0 - 44 U/L 25 22 35     Dg Fluoro Guide Lumbar Puncture  Result Date: 03/15/2019 CLINICAL DATA:  Fluoro guided intrathecal methotrexate for CNS prophylaxis in the setting of lymphoma. EXAM: Fluoroscopic guided lumbar puncture. Injection of intrathecal chemotherapy. FLUOROSCOPY TIME:  Fluoroscopy Time:  1 minutes Radiation Exposure Index (if provided by the fluoroscopic device): 5.9 mGy Number of Acquired Spot Images: 0 COMPARISON:  02/23/2019 FINDINGS: Informed consent was obtained from the patient prior to the procedure with the help of an interpreter. The risks including headache, allergy and pain were discussed. A time-out was performed and this was also checked against the  patient's chemotherapy from the pharmacy. With the patient in the prone position the lower back was prepped and draped with Betadine. 1% lidocaine was utilized for local anesthesia over the site. Lumbar puncture was performed at the L4-5 level with a 3.5 inch 20 gauge needle. After clear CSF flow was confirmed injection of 10 mL chemotherapy mixture of 12 mg methotrexate and 50 mg Solu-Cortef was injected into the space. The patient tolerated  the procedure well. Post procedure orders were placed, short note was entered into the chart and the patient was turned to the inpatient floor in stable condition. IMPRESSION: Successful lumbar puncture at L4-5 with injection of intrathecal chemotherapy. Electronically Signed   By: Zetta Bills M.D.   On: 03/15/2019 16:37     Dg Fluoro Guide Lumbar Puncture  Result Date: 03/15/2019 CLINICAL DATA:  Fluoro guided intrathecal methotrexate for CNS prophylaxis in the setting of lymphoma. EXAM: Fluoroscopic guided lumbar puncture. Injection of intrathecal chemotherapy. FLUOROSCOPY TIME:  Fluoroscopy Time:  1 minutes Radiation Exposure Index (if provided by the fluoroscopic device): 5.9 mGy Number of Acquired Spot Images: 0 COMPARISON:  02/23/2019 FINDINGS: Informed consent was obtained from the patient prior to the procedure with the help of an interpreter. The risks including headache, allergy and pain were discussed. A time-out was performed and this was also checked against the patient's chemotherapy from the pharmacy. With the patient in the prone position the lower back was prepped and draped with Betadine. 1% lidocaine was utilized for local anesthesia over the site. Lumbar puncture was performed at the L4-5 level with a 3.5 inch 20 gauge needle. After clear CSF flow was confirmed injection of 10 mL chemotherapy mixture of 12 mg methotrexate and 50 mg Solu-Cortef was injected into the space. The patient tolerated the procedure well. Post procedure orders were placed, short note was entered into the chart and the patient was turned to the inpatient floor in stable condition. IMPRESSION: Successful lumbar puncture at L4-5 with injection of intrathecal chemotherapy. Electronically Signed   By: Zetta Bills M.D.   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 The intent is for cure. -We discussed some of the risks, benefitsandside-effects of Rituximab,Etoposide, Vincristine, Adriamycin,Cytoxan,Prednisone.The regimen requires inpatient admission due to continuous chemotherapy infusion  -Some of the short term side-effects included, though not limited to, risk of fatigue, weight loss,tumor lysis syndrome, risk of allergic reactions,pancytopenia, life-threatening infections, need for transfusions of blood products, nausea, vomiting, change in bowel habits,hairloss, risk of congestive heart failure, admission to hospital for various reasons, and risks of death.  -Long term side-effects are also discussed includingpermanent damage to nerve function, chronic fatigue, and rare secondary malignancy including bone marrow disorders. -The patient is aware that the response rates discussed earlier is not guaranteed. -After a long discussion, patient made an informed decision to proceed with the prescribed plan of care. -She will proceed with cycle #5 of EPOCH today as scheduled.  -She has antiemetics available to her -CBC and CMET from today have been reviewed and are adequate for treatment.  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 -WBC is 10.6 today -We will monitor it for now  Hyponatremia -Na134 today -Continue Pedialyte and gentle salt addition to the diet, and maintain adequate hydration -We will monitor it closely  Hypokalemia -K+ 3.8 -Will continue potassium chloride 20 mEq daily.   Chemotherapy-associated nausea  -Secondary to chemotherapy -Symptoms relatively well controlled  -Continue PRN-anti-emetics  We will plan for Rituxan and Neulasta on 04/11/2019.  Follow-up visit with Dr. Irene Limbo with lab work on 04/18/2019.  Mikey Bussing, DNP, AGPCNP-BC, AOCNP   ADDENDUM  .Patient was Personally and independently interviewed, examined and relevant elements of the history of present illness were reviewed in details and an assessment and plan was created. All elements of the patient's history of present illness , assessment and plan were discussed in details with Mikey Bussing, DNP, AGPCNP-BC, AOCNP. The above documentation reflects our combined findings assessment and plan. IT mtx tomorrow - IR order placed.  Sullivan Lone MD MS

## 2019-04-04 NOTE — Progress Notes (Signed)
Rate adjusted for overfill in bag and a true 22 hour infusion.  Hardie Pulley, PharmD, BCPS, BCOP

## 2019-04-05 ENCOUNTER — Inpatient Hospital Stay (HOSPITAL_COMMUNITY): Payer: Commercial Managed Care - PPO

## 2019-04-05 DIAGNOSIS — C8338 Diffuse large B-cell lymphoma, lymph nodes of multiple sites: Secondary | ICD-10-CM

## 2019-04-05 LAB — CBC WITH DIFFERENTIAL/PLATELET
Abs Immature Granulocytes: 0.07 10*3/uL (ref 0.00–0.07)
Basophils Absolute: 0 10*3/uL (ref 0.0–0.1)
Basophils Relative: 0 %
Eosinophils Absolute: 0 10*3/uL (ref 0.0–0.5)
Eosinophils Relative: 0 %
HCT: 27.1 % — ABNORMAL LOW (ref 36.0–46.0)
Hemoglobin: 8.6 g/dL — ABNORMAL LOW (ref 12.0–15.0)
Immature Granulocytes: 1 %
Lymphocytes Relative: 4 %
Lymphs Abs: 0.3 10*3/uL — ABNORMAL LOW (ref 0.7–4.0)
MCH: 30.4 pg (ref 26.0–34.0)
MCHC: 31.7 g/dL (ref 30.0–36.0)
MCV: 95.8 fL (ref 80.0–100.0)
Monocytes Absolute: 0.2 10*3/uL (ref 0.1–1.0)
Monocytes Relative: 2 %
Neutro Abs: 8.9 10*3/uL — ABNORMAL HIGH (ref 1.7–7.7)
Neutrophils Relative %: 93 %
Platelets: 279 10*3/uL (ref 150–400)
RBC: 2.83 MIL/uL — ABNORMAL LOW (ref 3.87–5.11)
RDW: 19.6 % — ABNORMAL HIGH (ref 11.5–15.5)
WBC: 9.5 10*3/uL (ref 4.0–10.5)
nRBC: 0 % (ref 0.0–0.2)

## 2019-04-05 LAB — COMPREHENSIVE METABOLIC PANEL
ALT: 24 U/L (ref 0–44)
AST: 19 U/L (ref 15–41)
Albumin: 3.5 g/dL (ref 3.5–5.0)
Alkaline Phosphatase: 79 U/L (ref 38–126)
Anion gap: 7 (ref 5–15)
BUN: 14 mg/dL (ref 6–20)
CO2: 22 mmol/L (ref 22–32)
Calcium: 8.8 mg/dL — ABNORMAL LOW (ref 8.9–10.3)
Chloride: 106 mmol/L (ref 98–111)
Creatinine, Ser: 0.54 mg/dL (ref 0.44–1.00)
GFR calc Af Amer: >60 mL/min (ref >60)
GFR calc non Af Amer: >60 mL/min (ref >60)
Glucose, Bld: 142 mg/dL — ABNORMAL HIGH (ref 70–99)
Potassium: 3.8 mmol/L (ref 3.5–5.1)
Sodium: 135 mmol/L (ref 135–145)
Total Bilirubin: 0.6 mg/dL (ref 0.3–1.2)
Total Protein: 6 g/dL — ABNORMAL LOW (ref 6.5–8.1)

## 2019-04-05 MED ORDER — VITAMIN D 25 MCG (1000 UNIT) PO TABS
1000.0000 [IU] | ORAL_TABLET | Freq: Every day | ORAL | Status: DC
  Administered 2019-04-05 – 2019-04-08 (×4): 1000 [IU] via ORAL
  Filled 2019-04-05 (×4): qty 1

## 2019-04-05 MED ORDER — VINCRISTINE SULFATE CHEMO INJECTION 1 MG/ML
Freq: Once | INTRAVENOUS | Status: AC
  Administered 2019-04-05: 15:00:00 via INTRAVENOUS
  Filled 2019-04-05: qty 8

## 2019-04-05 MED ORDER — SODIUM CHLORIDE (PF) 0.9 % IJ SOLN
Freq: Once | INTRAMUSCULAR | Status: DC
  Filled 2019-04-05: qty 0.48

## 2019-04-05 MED ORDER — SODIUM CHLORIDE 0.9 % IV SOLN
Freq: Once | INTRAVENOUS | Status: AC
  Administered 2019-04-05: 14:00:00 18 mg via INTRAVENOUS
  Filled 2019-04-05: qty 4

## 2019-04-05 NOTE — Progress Notes (Addendum)
HEMATOLOGY-ONCOLOGY PROGRESS NOTE  SUBJECTIVE: Tolerated day one of her chemotherapy well overall.  She has been up ambulating in the hallway for about 30 to 40 minutes this morning.  Denies mucositis, abdominal pain, nausea, vomiting.  Last bowel movement 04/04/2019.  Denies chest pain or shortness of breath.  Peripheral neuropathy is stable.  Oncology History  DLBCL (diffuse large B cell lymphoma) ([place])  10/29/2018 Imaging   CT abdomen/pelvis: IMPRESSION: Abdominopelvic lymphadenopathy, including a dominant 3.2 cm short axis jejunal mesentery nodal mass encasing the SMA, partially necrotic. Overall appearance favors lymphoma, although nodal metastases are also possible.   Spleen is normal in size. However, hypoenhancing lesions are suspected on the portal venous phase, raising the possibility of lymphomatous involvement.   Suspected subcarinal nodal mass, incompletely evaluated. Consider CT chest or PET-CT for further evaluation.   Moderate left pleural effusion. Associated left lower lobe opacity, likely atelectasis.   11/10/2018 Imaging   PET: IMPRESSION: 1. 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 2. Nodal metastasis include the lower neck, mediastinum, mesentery, peritoneum and retroperitoneum, and iliac lymph nodes. 3. Spleen and bone marrow normal. 4. Moderate LEFT pleural effusion. 5. Target lymph nodes for sampling could include the LEFT external iliac lymph node, precordial lymph node in the LEFT upper quadrant, or super clavicular/LEFT sub pectoralis nodes.   12/03/2018 Bone Marrow Biopsy   Bone Marrow, Aspirate,Biopsy, and Clot, left posterior iliac crest - NORMOCELLULAR MARROW WITH FOCAL SMALL LYMPHOID AGGREGATES. - SEE COMMENT. PERIPHERAL BLOOD: - BORDERLINE NORMOCYTIC ANEMIA. Diagnosis Note The marrow has only a few small lymphoid aggregates which are not overtly atypical and contain a mixture of B-cells and T-cells.  Flow cytometry is negative for a monoclonal B-cell population. These findings are not diagnostic of a lymphoproliferative process.  Accession: [id number] Bone Marrow Flow Cytometry - NO MONOCLONAL B-CELL OR PHENOTYPICALLY ABERRANT T-CELL POPULATION IDENTIFIED.   12/03/2018 Imaging   TTE:  1. The left ventricle has normal systolic function with an ejection fraction of 60-65%. The cavity size was normal. Left ventricular diastolic Doppler parameters are consistent with impaired relaxation.  2. The right ventricle has normal systolic function. The cavity was normal. There is no increase in right ventricular wall thickness.  3. Large pleural effusion.  4. Clinical correlation suggested.  5. The mitral valve is grossly normal.  6. The tricuspid valve is grossly normal.  7. The aortic valve is grossly normal. Aortic valve regurgitation was not assessed by color flow Doppler.  8. The aortic root is normal in size and structure.   12/08/2018 Procedure   Left thoracentesis   12/08/2018 Pathology Results   Diagnosis PLEURAL FLUID, LEFT (SPECIMEN 1 OF 1 COLLECTED 12/08/18): - B-CELL LYMPHOMA - SEE COMMENT   12/09/2018 Procedure   Left supraclavicular LN bx    12/09/2018 Pathology Results   Accession: [id number] Lymph node for lymphoma, left deep cervical - DIFFUSE LARGE B-CELL LYMPHOMA - SEE COMMENT   12/21/2018 - 01/10/2019 Chemotherapy   The patient had DOXOrubicin (ADRIAMYCIN) chemo injection 80 mg, 50 mg/m2 = 80 mg, Intravenous,  Once, 1 of 6 cycles Administration: 80 mg (12/21/2018) palonosetron (ALOXI) injection 0.25 mg, 0.25 mg, Intravenous,  Once, 1 of 6 cycles Administration: 0.25 mg (12/21/2018) pegfilgrastim-cbqv (UDENYCA) injection 6 mg, 6 mg, Subcutaneous, Once, 1 of 6 cycles Administration: 6 mg (12/23/2018) vinCRIStine (ONCOVIN) 2 mg in sodium chloride 0.9 % 50 mL chemo infusion, 2 mg, Intravenous,  Once, 1 of 6 cycles Administration: 2 mg (12/21/2018) riTUXimab (RITUXAN) 600 mg in  sodium  chloride 0.9 % 250 mL (1.9355 mg/mL) infusion, 375 mg/m2 = 600 mg, Intravenous,  Once, 1 of 1 cycle Administration: 600 mg (12/21/2018) cyclophosphamide (CYTOXAN) 1,200 mg in sodium chloride 0.9 % 250 mL chemo infusion, 750 mg/m2 = 1,200 mg, Intravenous,  Once, 1 of 6 cycles Administration: 1,200 mg (12/21/2018) riTUXimab (RITUXAN) 600 mg in sodium chloride 0.9 % 190 mL infusion, 375 mg/m2 = 600 mg (100 % of original dose 375 mg/m2), Intravenous,  Once, 0 of 5 cycles Dose modification: 375 mg/m2 (original dose 375 mg/m2, Cycle 2, Reason: Provider Judgment, Comment: VO to change to RIR) fosaprepitant (EMEND) 150 mg, dexamethasone (DECADRON) 12 mg in sodium chloride 0.9 % 145 mL IVPB, , Intravenous,  Once, 1 of 6 cycles Administration:  (12/21/2018)  for chemotherapy treatment.    01/10/2019 -  Chemotherapy   The patient had DOXOrubicin (ADRIAMYCIN) 16 mg, etoposide (VEPESID) 80 mg, vinCRIStine (ONCOVIN) 0.6 mg in sodium chloride 0.9 % 1,000 mL chemo infusion, , Intravenous, Once, 5 of 5 cycles Administration:  (01/10/2019),  (01/11/2019),  (01/31/2019),  (02/01/2019),  (01/12/2019),  (01/13/2019),  (02/02/2019),  (02/03/2019),  (02/21/2019),  (02/22/2019),  (04/04/2019),  (02/23/2019),  (02/24/2019),  (03/14/2019),  (03/15/2019),  (03/16/2019),  (03/17/2019) ondansetron (ZOFRAN) 8 mg, dexamethasone (DECADRON) 10 mg in sodium chloride 0.9 % 50 mL IVPB, , Intravenous,  Once, 5 of 5 cycles Administration: 18 mg (01/10/2019), 8 mg (01/11/2019), 36 mg (01/14/2019), 8 mg (01/31/2019), 18 mg (02/01/2019), 36 mg (02/04/2019), 18 mg (01/12/2019), 18 mg (01/13/2019), 18 mg (02/02/2019), 18 mg (02/03/2019), 18 mg (02/21/2019), 8 mg (02/22/2019), 36 mg (02/25/2019), 18 mg (04/04/2019), 8 mg (02/23/2019), 8 mg (02/24/2019), 8 mg (03/14/2019), 18 mg (03/15/2019), 36 mg (03/18/2019), 8 mg (03/16/2019), 18 mg (03/17/2019) methotrexate (PF) 12 mg, hydrocortisone sodium succinate (SOLU-CORTEF) 50 mg in sodium chloride (PF) 0.9 % INTRATHECAL chemo  injection, , Intrathecal,  Once, 3 of 3 cycles Administration:  (02/23/2019),  (03/15/2019) cyclophosphamide (CYTOXAN) 1,180 mg in sodium chloride 0.9 % 250 mL chemo infusion, 750 mg/m2 = 1,180 mg, Intravenous,  Once, 5 of 5 cycles Administration: 1,180 mg (01/14/2019), 1,180 mg (02/04/2019), 1,180 mg (02/25/2019), 1,180 mg (03/18/2019)  for chemotherapy treatment.    01/17/2019 -  Chemotherapy   The patient had pegfilgrastim-cbqv (UDENYCA) injection 6 mg, 6 mg, Subcutaneous, Once, 4 of 6 cycles Administration: 6 mg (01/17/2019), 6 mg (02/07/2019), 6 mg (02/28/2019), 6 mg (03/21/2019) riTUXimab (RITUXAN) 600 mg in sodium chloride 0.9 % 250 mL (1.9355 mg/mL) infusion, 375 mg/m2 = 600 mg, Intravenous,  Once, 3 of 3 cycles Administration: 600 mg (02/07/2019) riTUXimab (RITUXAN) 600 mg in sodium chloride 0.9 % 190 mL infusion, 375 mg/m2 = 600 mg, Intravenous,  Once, 2 of 4 cycles Administration: 600 mg (02/28/2019), 600 mg (03/21/2019)  for chemotherapy treatment.       REVIEW OF SYSTEMS:   10 Point review of Systems was done is negative except as noted above.   I have reviewed the past medical history, past surgical history, social history and family history with the patient and they are unchanged from previous note.   PHYSICAL EXAMINATION: ECOG PERFORMANCE STATUS: 0 - Asymptomatic  Vitals:   04/04/19 2115 04/05/19 0634  BP: 97/64 120/73  Pulse: 95 71  Resp: 16 18  Temp: 98.3 F (36.8 C) (!) 97.5 F (36.4 C)  SpO2: 98% 95%   There were no vitals filed for this visit.  Intake/Output from previous day: No intake/output data recorded.  Marland Kitchen GENERAL:alert, in no acute distress and comfortable SKIN:  no acute rashes, no significant lesions EYES: conjunctiva are pink and non-injected, sclera anicteric OROPHARYNX: MMM, no exudates, no oropharyngeal erythema or ulceration NECK: supple, no JVD LYMPH:  no palpable lymphadenopathy in the cervical, axillary or inguinal regions LUNGS: clear to  auscultation b/l with normal respiratory effort HEART: regular rate & rhythm ABDOMEN:  normoactive bowel sounds , non tender, not distended. Extremity: no pedal edema PSYCH: alert & oriented x 3 with fluent speech NEURO: no focal motor/sensory deficits   LABORATORY DATA:  I have reviewed the data as listed  CMP Latest Ref Rng & Units 04/05/2019 04/04/2019 03/28/2019  Glucose 70 - 99 mg/dL 142(H) 90 98  BUN 6 - 20 mg/dL 14 15 5(L)  Creatinine 0.44 - 1.00 mg/dL 0.54 0.59 0.72  Sodium 135 - 145 mmol/L 135 134(L) 139  Potassium 3.5 - 5.1 mmol/L 3.8 3.8 4.2  Chloride 98 - 111 mmol/L 106 100 102  CO2 22 - 32 mmol/L _0 Calcium 8.9 - 10.3 mg/dL 8.8(L) 9.0 9.0  Total Protein 6.5 - 8.1 g/dL 6.0(L) 6.3(L) 6.1(L)  Total Bilirubin 0.3 - 1.2 mg/dL 0.6 0.3 0.2(L)  Alkaline Phos 38 - 126 U/L 79 91 157(H)  AST 15 - 41 U/L _1 ALT 0 - 44 U/L _2 Lab Results  Component Value Date   WBC 9.5 04/05/2019   HGB 8.6 (L) 04/05/2019   HCT 27.1 (L) 04/05/2019   MCV 95.8 04/05/2019   PLT 279 04/05/2019   NEUTROABS 8.9 (H) 04/05/2019   . CMP Latest Ref Rng & Units 04/05/2019 04/04/2019 03/28/2019  Glucose 70 - 99 mg/dL 142(H) 90 98  BUN 6 - 20 mg/dL 14 15 5(L)  Creatinine 0.44 - 1.00 mg/dL 0.54 0.59 0.72  Sodium 135 - 145 mmol/L 135 134(L) 139  Potassium 3.5 - 5.1 mmol/L 3.8 3.8 4.2  Chloride 98 - 111 mmol/L 106 100 102  CO2 22 - 32 mmol/L _3 Calcium 8.9 - 10.3 mg/dL 8.8(L) 9.0 9.0  Total Protein 6.5 - 8.1 g/dL 6.0(L) 6.3(L) 6.1(L)  Total Bilirubin 0.3 - 1.2 mg/dL 0.6 0.3 0.2(L)  Alkaline Phos 38 - 126 U/L 79 91 157(H)  AST 15 - 41 U/L _4 ALT 0 - 44 U/L _5 Dg Fluoro Guide Lumbar Puncture  Result Date: 03/15/2019 CLINICAL DATA:  Fluoro guided intrathecal methotrexate for CNS prophylaxis in the setting of lymphoma. EXAM: Fluoroscopic guided lumbar puncture. Injection of intrathecal chemotherapy. FLUOROSCOPY TIME:  Fluoroscopy Time:  1 minutes  Radiation Exposure Index (if provided by the fluoroscopic device): 5.9 mGy Number of Acquired Spot Images: 0 COMPARISON:  02/23/2019 FINDINGS: Informed consent was obtained from the patient prior to the procedure with the help of an interpreter. The risks including headache, allergy and pain were discussed. A time-out was performed and this was also checked against the patient's chemotherapy from the pharmacy. With the patient in the prone position the lower back was prepped and draped with Betadine. 1% lidocaine was utilized for local anesthesia over the site. Lumbar puncture was performed at the L4-5 level with a 3.5 inch 20 gauge needle. After clear CSF flow was confirmed injection of 10 mL chemotherapy mixture of 12 mg methotrexate and 50 mg Solu-Cortef was injected into the space. The patient tolerated the procedure well. Post procedure orders were placed, short note was entered into the chart and the patient was turned to the inpatient floor in  stable condition. IMPRESSION: Successful lumbar puncture at L4-5 with injection of intrathecal chemotherapy. Electronically Signed   By: Zetta Bills M.D.   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 The intent is for cure. -We discussed some of the risks, benefitsandside-effects of Rituximab,Etoposide, Vincristine, Adriamycin,Cytoxan,Prednisone.The regimen requires inpatient admission due to continuous chemotherapy infusion  -Some of the short term side-effects included, though not limited to, risk of fatigue, weight loss,tumor lysis  syndrome, risk of allergic reactions,pancytopenia, life-threatening infections, need for transfusions of blood products, nausea, vomiting, change in bowel habits,hairloss, risk of congestive heart failure, admission to hospital for various reasons, and risks of death.  -Long term side-effects are also discussed includingpermanent damage to nerve function, chronic fatigue, and rare secondary malignancy including bone marrow disorders. -The patient is aware that the response rates discussed earlier is not guaranteed. -After a long discussion, patient made an informed decision to proceed with the prescribed plan of care. -She will proceed with day 2 of cycle #5of EPOCH today as scheduled.  -She has antiemetics available to her -Labs from today have been reviewed and are adequate for treatment.  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 -WBC is normal today -We will monitor it for now  Hyponatremia -Na is normal today -Continue Pedialyte and gentle salt addition to the diet, and maintain adequate hydration -We will monitor it closely  Hypokalemia -Potassium was 3.8 today -Will continue potassium chloride 20 mEq daily.  Chemotherapy-associated nausea  -Secondary to chemotherapy -Symptoms relatively well controlled  -Continue PRN-anti-emetics  We will plan for Rituxan and Neulasta on 04/11/2019.  Follow-up visit with Dr. Irene Limbo with lab  work on 04/18/2019.   LOS: 1 day   Mikey Bussing    ADDENDUM  .Patient was Personally and independently interviewed, examined and relevant elements of the history of present illness were reviewed in details and an assessment and plan was created. All elements of the patient's history of present illness , assessment and plan were discussed in details with  Mikey Bussing DNP . The above documentation reflects our combined findings assessment and plan.  Sullivan Lone MD MS

## 2019-04-05 NOTE — Procedures (Signed)
Informed consent was obtained from the patient prior to the procedure with the help of the live video translator, including potential complications of headache, allergy, and pain.  A time-out was performed prior to the procedure, and the patient's chemotherapy mixture from the pharmacy was confirmed to match the patient's identity.  With the patient prone, the lower back was prepped with Betadine.  1% Lidocaine was used for local anesthesia.  Lumbar puncture was performed at the [L2-3] level using an interlaminar approach with 20 gauge needle with return of [clear] CSF.  [10 cc chemotherapy mixture] of [12 mg methotrexate and 50 mg Solu-Cortef] was injected into the subarachnoid space.  The needle was withdrawn, direct pressure held and no bleeding encountered.  The patient tolerated the procedure well and without apparent complication.  The patient was returned to the inpatient floor in stable condition.

## 2019-04-06 DIAGNOSIS — C8330 Diffuse large B-cell lymphoma, unspecified site: Secondary | ICD-10-CM

## 2019-04-06 LAB — COMPREHENSIVE METABOLIC PANEL
ALT: 23 U/L (ref 0–44)
AST: 16 U/L (ref 15–41)
Albumin: 3.1 g/dL — ABNORMAL LOW (ref 3.5–5.0)
Alkaline Phosphatase: 69 U/L (ref 38–126)
Anion gap: 7 (ref 5–15)
BUN: 17 mg/dL (ref 6–20)
CO2: 23 mmol/L (ref 22–32)
Calcium: 8.8 mg/dL — ABNORMAL LOW (ref 8.9–10.3)
Chloride: 108 mmol/L (ref 98–111)
Creatinine, Ser: 0.52 mg/dL (ref 0.44–1.00)
GFR calc Af Amer: >60 mL/min (ref >60)
GFR calc non Af Amer: >60 mL/min (ref >60)
Glucose, Bld: 129 mg/dL — ABNORMAL HIGH (ref 70–99)
Potassium: 3.7 mmol/L (ref 3.5–5.1)
Sodium: 138 mmol/L (ref 135–145)
Total Bilirubin: 0.2 mg/dL — ABNORMAL LOW (ref 0.3–1.2)
Total Protein: 5.5 g/dL — ABNORMAL LOW (ref 6.5–8.1)

## 2019-04-06 LAB — CBC WITH DIFFERENTIAL/PLATELET
Abs Immature Granulocytes: 0.07 10*3/uL (ref 0.00–0.07)
Basophils Absolute: 0 10*3/uL (ref 0.0–0.1)
Basophils Relative: 0 %
Eosinophils Absolute: 0 10*3/uL (ref 0.0–0.5)
Eosinophils Relative: 0 %
HCT: 25.9 % — ABNORMAL LOW (ref 36.0–46.0)
Hemoglobin: 8.1 g/dL — ABNORMAL LOW (ref 12.0–15.0)
Immature Granulocytes: 1 %
Lymphocytes Relative: 3 %
Lymphs Abs: 0.3 10*3/uL — ABNORMAL LOW (ref 0.7–4.0)
MCH: 29.9 pg (ref 26.0–34.0)
MCHC: 31.3 g/dL (ref 30.0–36.0)
MCV: 95.6 fL (ref 80.0–100.0)
Monocytes Absolute: 0.3 10*3/uL (ref 0.1–1.0)
Monocytes Relative: 4 %
Neutro Abs: 6.7 10*3/uL (ref 1.7–7.7)
Neutrophils Relative %: 92 %
Platelets: 294 10*3/uL (ref 150–400)
RBC: 2.71 MIL/uL — ABNORMAL LOW (ref 3.87–5.11)
RDW: 19.8 % — ABNORMAL HIGH (ref 11.5–15.5)
WBC: 7.3 10*3/uL (ref 4.0–10.5)
nRBC: 0 % (ref 0.0–0.2)

## 2019-04-06 MED ORDER — SODIUM CHLORIDE 0.9 % IV SOLN
Freq: Once | INTRAVENOUS | Status: AC
  Administered 2019-04-06: 17:00:00 8 mg via INTRAVENOUS
  Filled 2019-04-06: qty 4

## 2019-04-06 MED ORDER — VINCRISTINE SULFATE CHEMO INJECTION 1 MG/ML
Freq: Once | INTRAVENOUS | Status: AC
  Administered 2019-04-06: 17:00:00 via INTRAVENOUS
  Filled 2019-04-06: qty 8

## 2019-04-06 NOTE — Progress Notes (Addendum)
HEMATOLOGY-ONCOLOGY PROGRESS NOTE  SUBJECTIVE: Continues to tolerate her chemotherapy well overall.  Received intrathecal methotrexate yesterday and had a mild headache following the procedure which has now resolved.  Denies dizziness.  She denies mucositis, abdominal pain, nausea, vomiting.  Bowels moving without difficulty.  No bleeding.  Oncology History  DLBCL (diffuse large B cell lymphoma) ([place])  10/29/2018 Imaging   CT abdomen/pelvis: IMPRESSION: Abdominopelvic lymphadenopathy, including a dominant 3.2 cm short axis jejunal mesentery nodal mass encasing the SMA, partially necrotic. Overall appearance favors lymphoma, although nodal metastases are also possible.   Spleen is normal in size. However, hypoenhancing lesions are suspected on the portal venous phase, raising the possibility of lymphomatous involvement.   Suspected subcarinal nodal mass, incompletely evaluated. Consider CT chest or PET-CT for further evaluation.   Moderate left pleural effusion. Associated left lower lobe opacity, likely atelectasis.   11/10/2018 Imaging   PET: IMPRESSION: 1. 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 2. Nodal metastasis include the lower neck, mediastinum, mesentery, peritoneum and retroperitoneum, and iliac lymph nodes. 3. Spleen and bone marrow normal. 4. Moderate LEFT pleural effusion. 5. Target lymph nodes for sampling could include the LEFT external iliac lymph node, precordial lymph node in the LEFT upper quadrant, or super clavicular/LEFT sub pectoralis nodes.   12/03/2018 Bone Marrow Biopsy   Bone Marrow, Aspirate,Biopsy, and Clot, left posterior iliac crest - NORMOCELLULAR MARROW WITH FOCAL SMALL LYMPHOID AGGREGATES. - SEE COMMENT. PERIPHERAL BLOOD: - BORDERLINE NORMOCYTIC ANEMIA. Diagnosis Note The marrow has only a few small lymphoid aggregates which are not overtly atypical and contain a mixture of B-cells and T-cells.  Flow cytometry is negative for a monoclonal B-cell population. These findings are not diagnostic of a lymphoproliferative process.  Accession: [id number] Bone Marrow Flow Cytometry - NO MONOCLONAL B-CELL OR PHENOTYPICALLY ABERRANT T-CELL POPULATION IDENTIFIED.   12/03/2018 Imaging   TTE:  1. The left ventricle has normal systolic function with an ejection fraction of 60-65%. The cavity size was normal. Left ventricular diastolic Doppler parameters are consistent with impaired relaxation.  2. The right ventricle has normal systolic function. The cavity was normal. There is no increase in right ventricular wall thickness.  3. Large pleural effusion.  4. Clinical correlation suggested.  5. The mitral valve is grossly normal.  6. The tricuspid valve is grossly normal.  7. The aortic valve is grossly normal. Aortic valve regurgitation was not assessed by color flow Doppler.  8. The aortic root is normal in size and structure.   12/08/2018 Procedure   Left thoracentesis   12/08/2018 Pathology Results   Diagnosis PLEURAL FLUID, LEFT (SPECIMEN 1 OF 1 COLLECTED 12/08/18): - B-CELL LYMPHOMA - SEE COMMENT   12/09/2018 Procedure   Left supraclavicular LN bx    12/09/2018 Pathology Results   Accession: [id number] Lymph node for lymphoma, left deep cervical - DIFFUSE LARGE B-CELL LYMPHOMA - SEE COMMENT   12/21/2018 - 01/10/2019 Chemotherapy   The patient had DOXOrubicin (ADRIAMYCIN) chemo injection 80 mg, 50 mg/m2 = 80 mg, Intravenous,  Once, 1 of 6 cycles Administration: 80 mg (12/21/2018) palonosetron (ALOXI) injection 0.25 mg, 0.25 mg, Intravenous,  Once, 1 of 6 cycles Administration: 0.25 mg (12/21/2018) pegfilgrastim-cbqv (UDENYCA) injection 6 mg, 6 mg, Subcutaneous, Once, 1 of 6 cycles Administration: 6 mg (12/23/2018) vinCRIStine (ONCOVIN) 2 mg in sodium chloride 0.9 % 50 mL chemo infusion, 2 mg, Intravenous,  Once, 1 of 6 cycles Administration: 2 mg (12/21/2018) riTUXimab (RITUXAN) 600 mg in  sodium chloride 0.9 % 250 mL (1.9355 mg/mL)  infusion, 375 mg/m2 = 600 mg, Intravenous,  Once, 1 of 1 cycle Administration: 600 mg (12/21/2018) cyclophosphamide (CYTOXAN) 1,200 mg in sodium chloride 0.9 % 250 mL chemo infusion, 750 mg/m2 = 1,200 mg, Intravenous,  Once, 1 of 6 cycles Administration: 1,200 mg (12/21/2018) riTUXimab (RITUXAN) 600 mg in sodium chloride 0.9 % 190 mL infusion, 375 mg/m2 = 600 mg (100 % of original dose 375 mg/m2), Intravenous,  Once, 0 of 5 cycles Dose modification: 375 mg/m2 (original dose 375 mg/m2, Cycle 2, Reason: Provider Judgment, Comment: VO to change to RIR) fosaprepitant (EMEND) 150 mg, dexamethasone (DECADRON) 12 mg in sodium chloride 0.9 % 145 mL IVPB, , Intravenous,  Once, 1 of 6 cycles Administration:  (12/21/2018)  for chemotherapy treatment.    01/10/2019 -  Chemotherapy   The patient had DOXOrubicin (ADRIAMYCIN) 16 mg, etoposide (VEPESID) 80 mg, vinCRIStine (ONCOVIN) 0.6 mg in sodium chloride 0.9 % 1,000 mL chemo infusion, , Intravenous, Once, 5 of 5 cycles Administration:  (01/10/2019),  (01/11/2019),  (01/31/2019),  (02/01/2019),  (01/12/2019),  (01/13/2019),  (02/02/2019),  (02/03/2019),  (02/21/2019),  (02/22/2019),  (04/04/2019),  (04/05/2019),  (02/23/2019),  (02/24/2019),  (03/14/2019),  (03/15/2019),  (03/16/2019),  (03/17/2019) ondansetron (ZOFRAN) 8 mg, dexamethasone (DECADRON) 10 mg in sodium chloride 0.9 % 50 mL IVPB, , Intravenous,  Once, 5 of 5 cycles Administration: 18 mg (01/10/2019), 8 mg (01/11/2019), 36 mg (01/14/2019), 8 mg (01/31/2019), 18 mg (02/01/2019), 36 mg (02/04/2019), 18 mg (01/12/2019), 18 mg (01/13/2019), 18 mg (02/02/2019), 18 mg (02/03/2019), 18 mg (02/21/2019), 8 mg (02/22/2019), 36 mg (02/25/2019), 18 mg (04/04/2019), 18 mg (04/05/2019), 8 mg (02/23/2019), 8 mg (02/24/2019), 8 mg (03/14/2019), 18 mg (03/15/2019), 36 mg (03/18/2019), 8 mg (03/16/2019), 18 mg (03/17/2019) methotrexate (PF) 12 mg, hydrocortisone sodium succinate (SOLU-CORTEF) 50 mg in sodium  chloride (PF) 0.9 % INTRATHECAL chemo injection, , Intrathecal,  Once, 3 of 3 cycles Administration:  (02/23/2019),  (03/15/2019) cyclophosphamide (CYTOXAN) 1,180 mg in sodium chloride 0.9 % 250 mL chemo infusion, 750 mg/m2 = 1,180 mg, Intravenous,  Once, 5 of 5 cycles Administration: 1,180 mg (01/14/2019), 1,180 mg (02/04/2019), 1,180 mg (02/25/2019), 1,180 mg (03/18/2019)  for chemotherapy treatment.    01/17/2019 -  Chemotherapy   The patient had pegfilgrastim-cbqv (UDENYCA) injection 6 mg, 6 mg, Subcutaneous, Once, 4 of 6 cycles Administration: 6 mg (01/17/2019), 6 mg (02/07/2019), 6 mg (02/28/2019), 6 mg (03/21/2019) riTUXimab (RITUXAN) 600 mg in sodium chloride 0.9 % 250 mL (1.9355 mg/mL) infusion, 375 mg/m2 = 600 mg, Intravenous,  Once, 3 of 3 cycles Administration: 600 mg (02/07/2019) riTUXimab (RITUXAN) 600 mg in sodium chloride 0.9 % 190 mL infusion, 375 mg/m2 = 600 mg, Intravenous,  Once, 2 of 4 cycles Administration: 600 mg (02/28/2019), 600 mg (03/21/2019)  for chemotherapy treatment.       REVIEW OF SYSTEMS:   10 Point review of Systems was done is negative except as noted above.   I have reviewed the past medical history, past surgical history, social history and family history with the patient and they are unchanged from previous note.   PHYSICAL EXAMINATION: ECOG PERFORMANCE STATUS: 0 - Asymptomatic  Vitals:   04/05/19 2352 04/06/19 0536  BP: 119/67 113/69  Pulse: 69 62  Resp: 19 18  Temp: 97.6 F (36.4 C) 98 F (36.7 C)  SpO2: 99% 98%   Filed Weights   04/06/19 0536  Weight: 131 lb 13.4 oz (59.8 kg)    Intake/Output from previous day: 11/17 0701 - 11/18 0700 In: 1843.6 [P.O.:320; I.V.:778.2;  IV Piggyback:745.5] Out: 1800 [Urine:1800]  . GENERAL:alert, in no acute distress and comfortable SKIN: no acute rashes, no significant lesions EYES: conjunctiva are pink and non-injected, sclera anicteric OROPHARYNX: MMM, no exudates, no oropharyngeal erythema or  ulceration NECK: supple, no JVD LYMPH:  no palpable lymphadenopathy in the cervical, axillary or inguinal regions LUNGS: clear to auscultation b/l with normal respiratory effort HEART: regular rate & rhythm ABDOMEN:  normoactive bowel sounds , non tender, not distended. Extremity: no pedal edema PSYCH: alert & oriented x 3 with fluent speech NEURO: no focal motor/sensory deficits   LABORATORY DATA:  I have reviewed the data as listed  CMP Latest Ref Rng & Units 04/06/2019 04/05/2019 04/04/2019  Glucose 70 - 99 mg/dL 129(H) 142(H) 90  BUN 6 - 20 mg/dL 17 14 15   Creatinine 0.44 - 1.00 mg/dL 0.52 0.54 0.59  Sodium 135 - 145 mmol/L 138 135 134(L)  Potassium 3.5 - 5.1 mmol/L 3.7 3.8 3.8  Chloride 98 - 111 mmol/L 108 106 100  CO2 22 - 32 mmol/L 23 22 26   Calcium 8.9 - 10.3 mg/dL 8.8(L) 8.8(L) 9.0  Total Protein 6.5 - 8.1 g/dL 5.5(L) 6.0(L) 6.3(L)  Total Bilirubin 0.3 - 1.2 mg/dL 0.2(L) 0.6 0.3  Alkaline Phos 38 - 126 U/L 69 79 91  AST 15 - 41 U/L 16 19 18   ALT 0 - 44 U/L 23 24 25     Lab Results  Component Value Date   WBC 7.3 04/06/2019   HGB 8.1 (L) 04/06/2019   HCT 25.9 (L) 04/06/2019   MCV 95.6 04/06/2019   PLT 294 04/06/2019   NEUTROABS 6.7 04/06/2019   . CMP Latest Ref Rng & Units 04/06/2019 04/05/2019 04/04/2019  Glucose 70 - 99 mg/dL 129(H) 142(H) 90  BUN 6 - 20 mg/dL 17 14 15   Creatinine 0.44 - 1.00 mg/dL 0.52 0.54 0.59  Sodium 135 - 145 mmol/L 138 135 134(L)  Potassium 3.5 - 5.1 mmol/L 3.7 3.8 3.8  Chloride 98 - 111 mmol/L 108 106 100  CO2 22 - 32 mmol/L 23 22 26   Calcium 8.9 - 10.3 mg/dL 8.8(L) 8.8(L) 9.0  Total Protein 6.5 - 8.1 g/dL 5.5(L) 6.0(L) 6.3(L)  Total Bilirubin 0.3 - 1.2 mg/dL 0.2(L) 0.6 0.3  Alkaline Phos 38 - 126 U/L 69 79 91  AST 15 - 41 U/L 16 19 18   ALT 0 - 44 U/L 23 24 25     Dg Fluoro Guide Lumbar Puncture  Result Date: 04/05/2019 CLINICAL DATA:  Diffuse large B-cell lymphoma. Intrathecal methotrexate for CNS prophylaxis, treatment 3.  EXAM: FLUOROSCOPICALLY GUIDED LUMBAR PUNCTURE FOR INTRATHECAL CHEMOTHERAPY FLUOROSCOPY TIME:  0 minutes 30 seconds PROCEDURE: Informed consent was obtained from the patient prior to the procedure with the help of the live video translator, including potential complications of headache, allergy, and pain. A time-out was performed prior to the procedure, and the patient's chemotherapy mixture from the pharmacy was confirmed to match the patient's identity. With the patient prone, the lower back was prepped with Betadine. 1% Lidocaine was used for local anesthesia. Lumbar puncture was performed at the L2-3 level using an interlaminar approach with 20 gauge needle with return of clear CSF. 10 cc chemotherapy mixture of 12 mg methotrexate and 50 mg Solu-Cortef was injected into the subarachnoid space. The needle was withdrawn, direct pressure held and no bleeding encountered. The patient tolerated the procedure well and without apparent complication. The patient was returned to the inpatient floor in stable condition. IMPRESSION: 1. Fluoroscopic guided lumbar puncture  at L2-3. 2. Injection of intrathecal chemotherapy. Electronically Signed   By: Ilona Sorrel M.D.   On: 04/05/2019 15:19   Dg Fluoro Guide Lumbar Puncture  Result Date: 03/15/2019 CLINICAL DATA:  Fluoro guided intrathecal methotrexate for CNS prophylaxis in the setting of lymphoma. EXAM: Fluoroscopic guided lumbar puncture. Injection of intrathecal chemotherapy. FLUOROSCOPY TIME:  Fluoroscopy Time:  1 minutes Radiation Exposure Index (if provided by the fluoroscopic device): 5.9 mGy Number of Acquired Spot Images: 0 COMPARISON:  02/23/2019 FINDINGS: Informed consent was obtained from the patient prior to the procedure with the help of an interpreter. The risks including headache, allergy and pain were discussed. A time-out was performed and this was also checked against the patient's chemotherapy from the pharmacy. With the patient in the prone position  the lower back was prepped and draped with Betadine. 1% lidocaine was utilized for local anesthesia over the site. Lumbar puncture was performed at the L4-5 level with a 3.5 inch 20 gauge needle. After clear CSF flow was confirmed injection of 10 mL chemotherapy mixture of 12 mg methotrexate and 50 mg Solu-Cortef was injected into the space. The patient tolerated the procedure well. Post procedure orders were placed, short note was entered into the chart and the patient was turned to the inpatient floor in stable condition. IMPRESSION: Successful lumbar puncture at L4-5 with injection of intrathecal chemotherapy. Electronically Signed   By: Zetta Bills M.D.   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 The intent is for cure. -We discussed some of the risks, benefitsandside-effects of Rituximab,Etoposide, Vincristine, Adriamycin,Cytoxan,Prednisone.The regimen requires inpatient admission due to continuous chemotherapy infusion  -Some of the short term side-effects included, though not limited to, risk of fatigue, weight loss,tumor lysis syndrome, risk of allergic reactions,pancytopenia, life-threatening infections, need for transfusions of blood products, nausea, vomiting, change in bowel habits,hairloss, risk of congestive heart failure, admission to hospital for various reasons, and risks of death.  -Long term side-effects are also discussed includingpermanent damage to nerve function, chronic fatigue, and rare secondary malignancy including bone marrow disorders. -The  patient is aware that the response rates discussed earlier is not guaranteed. -After a long discussion, patient made an informed decision to proceed with the prescribed plan of care. -She will proceed with day 3 of cycle #5of EPOCH today as scheduled.  -She has antiemetics available to her -Labs from today have been reviewed and are adequate for treatment.  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 -WBC is normal today -We will monitor it for now  Hyponatremia -Na is normal today -Continue Pedialyte and  gentle salt addition to the diet, and maintain adequate hydration -We will monitor it closely  Hypokalemia -Potassium was 3.7 today -Will continue potassium chloride 20 mEq daily.  Chemotherapy-associated nausea  -Secondary to chemotherapy -Symptoms relatively well controlled  -Continue PRN-anti-emetics  We will plan for Rituxan and Neulasta on 04/11/2019.  Follow-up visit with Dr. Irene Limbo with lab work on 04/18/2019.   LOS: 2 days   Mikey Bussing    ADDENDUM  .Patient was Personally and independently interviewed, examined and relevant elements of the history of present illness were reviewed in details and an assessment and plan was created. All elements of the patient's history of present illness , assessment and plan were discussed in details with Mikey Bussing DNP. The above documentation  reflects our combined findings assessment and plan.  Sullivan Lone MD MS

## 2019-04-07 LAB — COMPREHENSIVE METABOLIC PANEL
ALT: 21 U/L (ref 0–44)
AST: 18 U/L (ref 15–41)
Albumin: 3.1 g/dL — ABNORMAL LOW (ref 3.5–5.0)
Alkaline Phosphatase: 65 U/L (ref 38–126)
Anion gap: 10 (ref 5–15)
BUN: 18 mg/dL (ref 6–20)
CO2: 22 mmol/L (ref 22–32)
Calcium: 8.6 mg/dL — ABNORMAL LOW (ref 8.9–10.3)
Chloride: 104 mmol/L (ref 98–111)
Creatinine, Ser: 0.56 mg/dL (ref 0.44–1.00)
GFR calc Af Amer: >60 mL/min (ref >60)
GFR calc non Af Amer: >60 mL/min (ref >60)
Glucose, Bld: 165 mg/dL — ABNORMAL HIGH (ref 70–99)
Potassium: 3.2 mmol/L — ABNORMAL LOW (ref 3.5–5.1)
Sodium: 136 mmol/L (ref 135–145)
Total Bilirubin: 0.5 mg/dL (ref 0.3–1.2)
Total Protein: 5.5 g/dL — ABNORMAL LOW (ref 6.5–8.1)

## 2019-04-07 LAB — CBC WITH DIFFERENTIAL/PLATELET
Abs Immature Granulocytes: 0.03 10*3/uL (ref 0.00–0.07)
Basophils Absolute: 0 10*3/uL (ref 0.0–0.1)
Basophils Relative: 0 %
Eosinophils Absolute: 0 10*3/uL (ref 0.0–0.5)
Eosinophils Relative: 0 %
HCT: 26.3 % — ABNORMAL LOW (ref 36.0–46.0)
Hemoglobin: 8.2 g/dL — ABNORMAL LOW (ref 12.0–15.0)
Immature Granulocytes: 1 %
Lymphocytes Relative: 5 %
Lymphs Abs: 0.2 10*3/uL — ABNORMAL LOW (ref 0.7–4.0)
MCH: 29.8 pg (ref 26.0–34.0)
MCHC: 31.2 g/dL (ref 30.0–36.0)
MCV: 95.6 fL (ref 80.0–100.0)
Monocytes Absolute: 0.2 10*3/uL (ref 0.1–1.0)
Monocytes Relative: 4 %
Neutro Abs: 3.5 10*3/uL (ref 1.7–7.7)
Neutrophils Relative %: 90 %
Platelets: 286 10*3/uL (ref 150–400)
RBC: 2.75 MIL/uL — ABNORMAL LOW (ref 3.87–5.11)
RDW: 19.1 % — ABNORMAL HIGH (ref 11.5–15.5)
WBC: 3.9 10*3/uL — ABNORMAL LOW (ref 4.0–10.5)
nRBC: 0 % (ref 0.0–0.2)

## 2019-04-07 LAB — TYPE AND SCREEN
ABO/RH(D): A POS
Antibody Screen: NEGATIVE

## 2019-04-07 LAB — ABO/RH: ABO/RH(D): A POS

## 2019-04-07 MED ORDER — VINCRISTINE SULFATE CHEMO INJECTION 1 MG/ML
Freq: Once | INTRAVENOUS | Status: AC
  Administered 2019-04-07: 16:00:00 via INTRAVENOUS
  Filled 2019-04-07: qty 8

## 2019-04-07 MED ORDER — SODIUM CHLORIDE 0.9 % IV SOLN
Freq: Once | INTRAVENOUS | Status: AC
  Administered 2019-04-07: 15:00:00 18 mg via INTRAVENOUS
  Filled 2019-04-07 (×2): qty 4

## 2019-04-07 NOTE — Progress Notes (Signed)
Keep rate at 51 ml/hr as it has been adjusted for overfill in bag

## 2019-04-07 NOTE — TOC Initial Note (Signed)
Transition of Care [facility]) - Initial/Assessment Note    Patient Details  Name: Denise Walls MRN: [id number] Date of Birth: 09/17/1960  [facility]) CM/SW Contact:    Olinda Nola, Marjie Skiff, RN Phone [phone] 04/07/2019, 11:10 AM                   Expected Discharge Plan: Home/Self Care Barriers to Discharge: Continued Medical Work up(Chemo)  Expected Discharge Plan and Services Expected Discharge Plan: Home/Self Care       Living arrangements for the past 2 months: Single Family Home                   Prior Living Arrangements/Services Living arrangements for the past 2 months: Single Family Home Lives with:: Spouse   Activities of Daily Living Home Assistive Devices/Equipment: Eyeglasses ADL Screening (condition at time of admission) Patient's cognitive ability adequate to safely complete daily activities?: Yes Is the patient deaf or have difficulty hearing?: No Does the patient have difficulty seeing, even when wearing glasses/contacts?: No Does the patient have difficulty concentrating, remembering, or making decisions?: No Patient able to express need for assistance with ADLs?: Yes Does the patient have difficulty dressing or bathing?: No Independently performs ADLs?: Yes (appropriate for developmental age) Does the patient have difficulty walking or climbing stairs?: Yes Weakness of Legs: Both Weakness of Arms/Hands: Both(reports numbness in fingers and hand)  Admission diagnosis:  Chemo Diffuse large B Cell Lymphoma Patient Active Problem List   Diagnosis Date Noted  . Large cell (diffuse) non-Hodgkin's lymphoma ([place])   . Hypokalemia   . Encounter for antineoplastic chemotherapy   . Diffuse large B cell lymphoma ([place]) 01/10/2019  . Chemotherapy-induced nausea 01/04/2019  . Hyponatremia 12/28/2018  . Neutropenia due to and not concurrent with chemotherapy ([place]) 12/28/2018  . Crohn disease ([place]) 12/28/2018  . Essential hypertension  12/28/2018  . Malignant pleural effusion 12/14/2018  . DLBCL (diffuse large B cell lymphoma) ([place]) 11/16/2018  . Anemia 11/16/2018   PCP:  [name], MD Pharmacy:   [id number] [place] [address] [address]Phone: [phone]: [phone]     Readmission Risk Interventions Readmission Risk Prevention Plan 04/07/2019 02/23/2019  Transportation Screening Complete Complete  PCP or Specialist Appt within 3-5 Days Not Complete Not Complete  Not Complete comments Not ready for dc Not ready for dc  [facility] Not Complete  HRI or Home Care Consult comments NA NA  Social Work Consult [place] Not Complete Not Complete  SW consult not completed comments NA NA  Palliative Care Screening Not Applicable Not Applicable  Medication Review (RN Care Manager) Complete Complete  Some recent data might be hidden

## 2019-04-07 NOTE — Progress Notes (Addendum)
HEMATOLOGY-ONCOLOGY PROGRESS NOTE  SUBJECTIVE: Reports that she is doing well this morning.  Ambulated in the hallway for 40 minutes yesterday.  She denies mucositis, abdominal pain, nausea, vomiting.  Bowels are moving without difficulty.  Denies chest pain and shortness of breath.  No cough.  Denies bleeding.  Oncology History  DLBCL (diffuse large B cell lymphoma) ([place])  10/29/2018 Imaging   CT abdomen/pelvis: IMPRESSION: Abdominopelvic lymphadenopathy, including a dominant 3.2 cm short axis jejunal mesentery nodal mass encasing the SMA, partially necrotic. Overall appearance favors lymphoma, although nodal metastases are also possible.   Spleen is normal in size. However, hypoenhancing lesions are suspected on the portal venous phase, raising the possibility of lymphomatous involvement.   Suspected subcarinal nodal mass, incompletely evaluated. Consider CT chest or PET-CT for further evaluation.   Moderate left pleural effusion. Associated left lower lobe opacity, likely atelectasis.   11/10/2018 Imaging   PET: IMPRESSION: 1. 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 2. Nodal metastasis include the lower neck, mediastinum, mesentery, peritoneum and retroperitoneum, and iliac lymph nodes. 3. Spleen and bone marrow normal. 4. Moderate LEFT pleural effusion. 5. Target lymph nodes for sampling could include the LEFT external iliac lymph node, precordial lymph node in the LEFT upper quadrant, or super clavicular/LEFT sub pectoralis nodes.   12/03/2018 Bone Marrow Biopsy   Bone Marrow, Aspirate,Biopsy, and Clot, left posterior iliac crest - NORMOCELLULAR MARROW WITH FOCAL SMALL LYMPHOID AGGREGATES. - SEE COMMENT. PERIPHERAL BLOOD: - BORDERLINE NORMOCYTIC ANEMIA. Diagnosis Note The marrow has only a few small lymphoid aggregates which are not overtly atypical and contain a mixture of B-cells and T-cells. Flow cytometry is negative for a  monoclonal B-cell population. These findings are not diagnostic of a lymphoproliferative process.  Accession: [id number] Bone Marrow Flow Cytometry - NO MONOCLONAL B-CELL OR PHENOTYPICALLY ABERRANT T-CELL POPULATION IDENTIFIED.   12/03/2018 Imaging   TTE:  1. The left ventricle has normal systolic function with an ejection fraction of 60-65%. The cavity size was normal. Left ventricular diastolic Doppler parameters are consistent with impaired relaxation.  2. The right ventricle has normal systolic function. The cavity was normal. There is no increase in right ventricular wall thickness.  3. Large pleural effusion.  4. Clinical correlation suggested.  5. The mitral valve is grossly normal.  6. The tricuspid valve is grossly normal.  7. The aortic valve is grossly normal. Aortic valve regurgitation was not assessed by color flow Doppler.  8. The aortic root is normal in size and structure.   12/08/2018 Procedure   Left thoracentesis   12/08/2018 Pathology Results   Diagnosis PLEURAL FLUID, LEFT (SPECIMEN 1 OF 1 COLLECTED 12/08/18): - B-CELL LYMPHOMA - SEE COMMENT   12/09/2018 Procedure   Left supraclavicular LN bx    12/09/2018 Pathology Results   Accession: [id number] Lymph node for lymphoma, left deep cervical - DIFFUSE LARGE B-CELL LYMPHOMA - SEE COMMENT   12/21/2018 - 01/10/2019 Chemotherapy   The patient had DOXOrubicin (ADRIAMYCIN) chemo injection 80 mg, 50 mg/m2 = 80 mg, Intravenous,  Once, 1 of 6 cycles Administration: 80 mg (12/21/2018) palonosetron (ALOXI) injection 0.25 mg, 0.25 mg, Intravenous,  Once, 1 of 6 cycles Administration: 0.25 mg (12/21/2018) pegfilgrastim-cbqv (UDENYCA) injection 6 mg, 6 mg, Subcutaneous, Once, 1 of 6 cycles Administration: 6 mg (12/23/2018) vinCRIStine (ONCOVIN) 2 mg in sodium chloride 0.9 % 50 mL chemo infusion, 2 mg, Intravenous,  Once, 1 of 6 cycles Administration: 2 mg (12/21/2018) riTUXimab (RITUXAN) 600 mg in sodium chloride 0.9 % 250 mL (1.9355  mg/mL) infusion, 375 mg/m2 = 600 mg, Intravenous,  Once, 1 of 1 cycle Administration: 600 mg (12/21/2018) cyclophosphamide (CYTOXAN) 1,200 mg in sodium chloride 0.9 % 250 mL chemo infusion, 750 mg/m2 = 1,200 mg, Intravenous,  Once, 1 of 6 cycles Administration: 1,200 mg (12/21/2018) riTUXimab (RITUXAN) 600 mg in sodium chloride 0.9 % 190 mL infusion, 375 mg/m2 = 600 mg (100 % of original dose 375 mg/m2), Intravenous,  Once, 0 of 5 cycles Dose modification: 375 mg/m2 (original dose 375 mg/m2, Cycle 2, Reason: Provider Judgment, Comment: VO to change to RIR) fosaprepitant (EMEND) 150 mg, dexamethasone (DECADRON) 12 mg in sodium chloride 0.9 % 145 mL IVPB, , Intravenous,  Once, 1 of 6 cycles Administration:  (12/21/2018)  for chemotherapy treatment.    01/10/2019 -  Chemotherapy   The patient had DOXOrubicin (ADRIAMYCIN) 16 mg, etoposide (VEPESID) 80 mg, vinCRIStine (ONCOVIN) 0.6 mg in sodium chloride 0.9 % 1,000 mL chemo infusion, , Intravenous, Once, 5 of 5 cycles Administration:  (01/10/2019),  (01/11/2019),  (01/31/2019),  (02/01/2019),  (01/12/2019),  (01/13/2019),  (02/02/2019),  (02/03/2019),  (02/21/2019),  (02/22/2019),  (04/04/2019),  (04/05/2019),  (02/23/2019),  (02/24/2019),  (03/14/2019),  (03/15/2019),  (03/16/2019),  (03/17/2019) ondansetron (ZOFRAN) 8 mg, dexamethasone (DECADRON) 10 mg in sodium chloride 0.9 % 50 mL IVPB, , Intravenous,  Once, 5 of 5 cycles Administration: 18 mg (01/10/2019), 8 mg (01/11/2019), 36 mg (01/14/2019), 8 mg (01/31/2019), 18 mg (02/01/2019), 36 mg (02/04/2019), 18 mg (01/12/2019), 18 mg (01/13/2019), 18 mg (02/02/2019), 18 mg (02/03/2019), 18 mg (02/21/2019), 8 mg (02/22/2019), 36 mg (02/25/2019), 18 mg (04/04/2019), 18 mg (04/05/2019), 8 mg (02/23/2019), 8 mg (02/24/2019), 8 mg (03/14/2019), 18 mg (03/15/2019), 36 mg (03/18/2019), 8 mg (03/16/2019), 18 mg (03/17/2019) methotrexate (PF) 12 mg, hydrocortisone sodium succinate (SOLU-CORTEF) 50 mg in sodium chloride (PF) 0.9 % INTRATHECAL chemo  injection, , Intrathecal,  Once, 3 of 3 cycles Administration:  (02/23/2019),  (03/15/2019) cyclophosphamide (CYTOXAN) 1,180 mg in sodium chloride 0.9 % 250 mL chemo infusion, 750 mg/m2 = 1,180 mg, Intravenous,  Once, 5 of 5 cycles Administration: 1,180 mg (01/14/2019), 1,180 mg (02/04/2019), 1,180 mg (02/25/2019), 1,180 mg (03/18/2019)  for chemotherapy treatment.    01/17/2019 -  Chemotherapy   The patient had pegfilgrastim-cbqv (UDENYCA) injection 6 mg, 6 mg, Subcutaneous, Once, 4 of 6 cycles Administration: 6 mg (01/17/2019), 6 mg (02/07/2019), 6 mg (02/28/2019), 6 mg (03/21/2019) riTUXimab (RITUXAN) 600 mg in sodium chloride 0.9 % 250 mL (1.9355 mg/mL) infusion, 375 mg/m2 = 600 mg, Intravenous,  Once, 3 of 3 cycles Administration: 600 mg (02/07/2019) riTUXimab (RITUXAN) 600 mg in sodium chloride 0.9 % 190 mL infusion, 375 mg/m2 = 600 mg, Intravenous,  Once, 2 of 4 cycles Administration: 600 mg (02/28/2019), 600 mg (03/21/2019)  for chemotherapy treatment.       REVIEW OF SYSTEMS:   10 Point review of Systems was done is negative except as noted above.   I have reviewed the past medical history, past surgical history, social history and family history with the patient and they are unchanged from previous note.   PHYSICAL EXAMINATION: ECOG PERFORMANCE STATUS: 0 - Asymptomatic  Vitals:   04/06/19 2102 04/07/19 0551  BP: 126/76 136/77  Pulse: 61 (!) 57  Resp: 18 17  Temp: 98.6 F (37 C) 98 F (36.7 C)  SpO2: 97% 99%   Filed Weights   04/06/19 0536  Weight: 131 lb 13.4 oz (59.8 kg)    Intake/Output from previous day: 11/18 0701 - 11/19 0700 In: 1230.5 [  P.O.:320; I.V.:420; IV Piggyback:490.5] Out: 300 [Urine:300]  . GENERAL:alert, in no acute distress and comfortable SKIN: no acute rashes, no significant lesions EYES: conjunctiva are pink and non-injected, sclera anicteric OROPHARYNX: MMM, no exudates, no oropharyngeal erythema or ulceration NECK: supple, no JVD LYMPH:  no  palpable lymphadenopathy in the cervical, axillary or inguinal regions LUNGS: clear to auscultation b/l with normal respiratory effort HEART: regular rate & rhythm ABDOMEN:  normoactive bowel sounds , non tender, not distended. Extremity: no pedal edema PSYCH: alert & oriented x 3 with fluent speech NEURO: no focal motor/sensory deficits   LABORATORY DATA:  I have reviewed the data as listed  CMP Latest Ref Rng & Units 04/07/2019 04/06/2019 04/05/2019  Glucose 70 - 99 mg/dL 165(H) 129(H) 142(H)  BUN 6 - 20 mg/dL 18 17 14   Creatinine 0.44 - 1.00 mg/dL 0.56 0.52 0.54  Sodium 135 - 145 mmol/L 136 138 135  Potassium 3.5 - 5.1 mmol/L 3.2(L) 3.7 3.8  Chloride 98 - 111 mmol/L 104 108 106  CO2 22 - 32 mmol/L 22 23 22   Calcium 8.9 - 10.3 mg/dL 8.6(L) 8.8(L) 8.8(L)  Total Protein 6.5 - 8.1 g/dL 5.5(L) 5.5(L) 6.0(L)  Total Bilirubin 0.3 - 1.2 mg/dL 0.5 0.2(L) 0.6  Alkaline Phos 38 - 126 U/L 65 69 79  AST 15 - 41 U/L 18 16 19   ALT 0 - 44 U/L 21 23 24     Lab Results  Component Value Date   WBC 3.9 (L) 04/07/2019   HGB 8.2 (L) 04/07/2019   HCT 26.3 (L) 04/07/2019   MCV 95.6 04/07/2019   PLT 286 04/07/2019   NEUTROABS 3.5 04/07/2019   . CMP Latest Ref Rng & Units 04/07/2019 04/06/2019 04/05/2019  Glucose 70 - 99 mg/dL 165(H) 129(H) 142(H)  BUN 6 - 20 mg/dL 18 17 14   Creatinine 0.44 - 1.00 mg/dL 0.56 0.52 0.54  Sodium 135 - 145 mmol/L 136 138 135  Potassium 3.5 - 5.1 mmol/L 3.2(L) 3.7 3.8  Chloride 98 - 111 mmol/L 104 108 106  CO2 22 - 32 mmol/L 22 23 22   Calcium 8.9 - 10.3 mg/dL 8.6(L) 8.8(L) 8.8(L)  Total Protein 6.5 - 8.1 g/dL 5.5(L) 5.5(L) 6.0(L)  Total Bilirubin 0.3 - 1.2 mg/dL 0.5 0.2(L) 0.6  Alkaline Phos 38 - 126 U/L 65 69 79  AST 15 - 41 U/L 18 16 19   ALT 0 - 44 U/L 21 23 24     Dg Fluoro Guide Lumbar Puncture  Result Date: 04/05/2019 CLINICAL DATA:  Diffuse large B-cell lymphoma. Intrathecal methotrexate for CNS prophylaxis, treatment 3. EXAM: FLUOROSCOPICALLY GUIDED  LUMBAR PUNCTURE FOR INTRATHECAL CHEMOTHERAPY FLUOROSCOPY TIME:  0 minutes 30 seconds PROCEDURE: Informed consent was obtained from the patient prior to the procedure with the help of the live video translator, including potential complications of headache, allergy, and pain. A time-out was performed prior to the procedure, and the patient's chemotherapy mixture from the pharmacy was confirmed to match the patient's identity. With the patient prone, the lower back was prepped with Betadine. 1% Lidocaine was used for local anesthesia. Lumbar puncture was performed at the L2-3 level using an interlaminar approach with 20 gauge needle with return of clear CSF. 10 cc chemotherapy mixture of 12 mg methotrexate and 50 mg Solu-Cortef was injected into the subarachnoid space. The needle was withdrawn, direct pressure held and no bleeding encountered. The patient tolerated the procedure well and without apparent complication. The patient was returned to the inpatient floor in stable condition. IMPRESSION: 1. Fluoroscopic  guided lumbar puncture at L2-3. 2. Injection of intrathecal chemotherapy. Electronically Signed   By: Ilona Sorrel M.D.   On: 04/05/2019 15:19   Dg Fluoro Guide Lumbar Puncture  Result Date: 03/15/2019 CLINICAL DATA:  Fluoro guided intrathecal methotrexate for CNS prophylaxis in the setting of lymphoma. EXAM: Fluoroscopic guided lumbar puncture. Injection of intrathecal chemotherapy. FLUOROSCOPY TIME:  Fluoroscopy Time:  1 minutes Radiation Exposure Index (if provided by the fluoroscopic device): 5.9 mGy Number of Acquired Spot Images: 0 COMPARISON:  02/23/2019 FINDINGS: Informed consent was obtained from the patient prior to the procedure with the help of an interpreter. The risks including headache, allergy and pain were discussed. A time-out was performed and this was also checked against the patient's chemotherapy from the pharmacy. With the patient in the prone position the lower back was prepped  and draped with Betadine. 1% lidocaine was utilized for local anesthesia over the site. Lumbar puncture was performed at the L4-5 level with a 3.5 inch 20 gauge needle. After clear CSF flow was confirmed injection of 10 mL chemotherapy mixture of 12 mg methotrexate and 50 mg Solu-Cortef was injected into the space. The patient tolerated the procedure well. Post procedure orders were placed, short note was entered into the chart and the patient was turned to the inpatient floor in stable condition. IMPRESSION: Successful lumbar puncture at L4-5 with injection of intrathecal chemotherapy. Electronically Signed   By: Zetta Bills M.D.   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 The intent is for cure. -We discussed some of the risks, benefitsandside-effects of Rituximab,Etoposide, Vincristine, Adriamycin,Cytoxan,Prednisone.The regimen requires inpatient admission due to continuous chemotherapy infusion  -Some of the short term side-effects included, though not limited to, risk of fatigue, weight loss,tumor lysis syndrome, risk of allergic reactions,pancytopenia, life-threatening infections, need for transfusions of blood products, nausea, vomiting, change in bowel habits,hairloss, risk of congestive heart failure, admission to hospital for various reasons, and risks of death.  -Long term side-effects are also discussed includingpermanent damage to nerve function, chronic fatigue, and rare secondary malignancy including bone marrow disorders. -The patient is aware that the  response rates discussed earlier is not guaranteed. -After a long discussion, patient made an informed decision to proceed with the prescribed plan of care. -She will proceed with day 4 of cycle #5of EPOCH today as scheduled.  -She has antiemetics available to her -Labs from today have been reviewed and are adequate for treatment.  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 -WBC is  slightly low at 3.9; ANC normal at 3.5 -We will monitor it for now  Hyponatremia -Na is  normal today -Continue Pedialyte and gentle salt addition to the diet, and maintain adequate hydration -We will monitor it closely  Hypokalemia -Potassium 3.2 today -Will increase potassium chloride 20 mEq twice a day. -Recheck CMET in am   Chemotherapy-associated nausea  -Secondary to chemotherapy -Symptoms relatively well controlled  -Continue PRN-anti-emetics  We will plan for Rituxan and Neulasta on 04/11/2019.  Follow-up visit with Dr. Irene Limbo with lab work on 04/18/2019.   LOS: 3 days   Denise Walls    ADDENDUM  .Patient was Personally and independently interviewed, examined and relevant elements of the history of present illness were reviewed in details and an assessment and plan was created. All elements of the patient's history of present illness , assessment and plan were discussed in details with **. The above  documentation reflects our combined findings assessment and plan.  Sullivan Lone MD MS

## 2019-04-08 LAB — COMPREHENSIVE METABOLIC PANEL
ALT: 32 U/L (ref 0–44)
AST: 19 U/L (ref 15–41)
Albumin: 3.3 g/dL — ABNORMAL LOW (ref 3.5–5.0)
Alkaline Phosphatase: 62 U/L (ref 38–126)
Anion gap: 10 (ref 5–15)
BUN: 16 mg/dL (ref 6–20)
CO2: 24 mmol/L (ref 22–32)
Calcium: 8.6 mg/dL — ABNORMAL LOW (ref 8.9–10.3)
Chloride: 104 mmol/L (ref 98–111)
Creatinine, Ser: 0.54 mg/dL (ref 0.44–1.00)
GFR calc Af Amer: >60 mL/min (ref >60)
GFR calc non Af Amer: >60 mL/min (ref >60)
Glucose, Bld: 122 mg/dL — ABNORMAL HIGH (ref 70–99)
Potassium: 3.3 mmol/L — ABNORMAL LOW (ref 3.5–5.1)
Sodium: 138 mmol/L (ref 135–145)
Total Bilirubin: 0.4 mg/dL (ref 0.3–1.2)
Total Protein: 5.4 g/dL — ABNORMAL LOW (ref 6.5–8.1)

## 2019-04-08 LAB — CBC WITH DIFFERENTIAL/PLATELET
Abs Immature Granulocytes: 0.03 10*3/uL (ref 0.00–0.07)
Basophils Absolute: 0 10*3/uL (ref 0.0–0.1)
Basophils Relative: 0 %
Eosinophils Absolute: 0 10*3/uL (ref 0.0–0.5)
Eosinophils Relative: 0 %
HCT: 25.2 % — ABNORMAL LOW (ref 36.0–46.0)
Hemoglobin: 8.1 g/dL — ABNORMAL LOW (ref 12.0–15.0)
Immature Granulocytes: 1 %
Lymphocytes Relative: 9 %
Lymphs Abs: 0.2 10*3/uL — ABNORMAL LOW (ref 0.7–4.0)
MCH: 30.1 pg (ref 26.0–34.0)
MCHC: 32.1 g/dL (ref 30.0–36.0)
MCV: 93.7 fL (ref 80.0–100.0)
Monocytes Absolute: 0.1 10*3/uL (ref 0.1–1.0)
Monocytes Relative: 4 %
Neutro Abs: 1.9 10*3/uL (ref 1.7–7.7)
Neutrophils Relative %: 86 %
Platelets: 285 10*3/uL (ref 150–400)
RBC: 2.69 MIL/uL — ABNORMAL LOW (ref 3.87–5.11)
RDW: 18.5 % — ABNORMAL HIGH (ref 11.5–15.5)
WBC: 2.2 10*3/uL — ABNORMAL LOW (ref 4.0–10.5)
nRBC: 0 % (ref 0.0–0.2)

## 2019-04-08 MED ORDER — HEPARIN SOD (PORK) LOCK FLUSH 100 UNIT/ML IV SOLN: 500.0000 [IU] | INTRAVENOUS | Status: DC

## 2019-04-08 MED ORDER — HEPARIN SOD (PORK) LOCK FLUSH 100 UNIT/ML IV SOLN
500.0000 [IU] | INTRAVENOUS | Status: DC | PRN
  Filled 2019-04-08: qty 5

## 2019-04-08 MED ORDER — SODIUM CHLORIDE 0.9 % IV SOLN
Freq: Once | INTRAVENOUS | Status: AC
  Administered 2019-04-08: 12:00:00 36 mg via INTRAVENOUS
  Filled 2019-04-08: qty 8

## 2019-04-08 MED ORDER — HEPARIN SOD (PORK) LOCK FLUSH 100 UNIT/ML IV SOLN: 500.0000 [IU] | INTRAVENOUS | Status: DC | PRN

## 2019-04-08 MED ORDER — SODIUM CHLORIDE 0.9 % IV SOLN
750.0000 mg/m2 | Freq: Once | INTRAVENOUS | Status: AC
  Administered 2019-04-08: 14:00:00 1180 mg via INTRAVENOUS
  Filled 2019-04-08: qty 59

## 2019-04-08 NOTE — Discharge Summary (Signed)
.  [place]  [phone] [phone]) [phone]    Physician Discharge Summary     Patient ID: Denise Walls MRN: [id number] DOB/AGE: 1961-01-29 58 y.o.  Admit date: 04/04/2019 Discharge date: 04/08/2019  Primary Care Physician:  [name], MD   Discharge Diagnoses:    Present on Admission:  Diffuse large B cell lymphoma [facility])   Discharge Medications:  Allergies as of 04/08/2019      Reactions   Nsaids Anaphylaxis, Other (See Comments)   Abdominal pain, GI Bleeding      Medication List    TAKE these medications   acetaminophen 325 MG tablet Commonly known as: TYLENOL Take 325-650 mg by mouth every 6 (six) hours as needed for mild pain.   B COMPLEX PO Take 1 tablet by mouth daily.   calcium carbonate 500 MG chewable tablet Commonly known as: TUMS - dosed in mg elemental calcium Chew 2 tablets by mouth daily as needed for indigestion or heartburn.   cholecalciferol 25 MCG (1000 UT) tablet Commonly known as: VITAMIN D3 Take 1 tablet (1,000 Units total) by mouth daily.   lidocaine-prilocaine cream Commonly known as: EMLA Apply to affected area once What changed:   how much to take  how to take this  when to take this  reasons to take this  additional instructions   mesalamine 1.2 g EC tablet Commonly known as: LIALDA Take 2.4 g by mouth daily.   multivitamin-prenatal 27-0.8 MG Tabs tablet Take 1 tablet by mouth daily at 12 noon.   ondansetron 8 MG tablet Commonly known as: Zofran Take 1 tablet (8 mg total) by mouth 2 (two) times daily as needed for refractory nausea / vomiting. Start on day 3 after cyclophosphamide chemotherapy.   polyethylene glycol 17 g packet Commonly known as: MIRALAX / GLYCOLAX Take 17 g by mouth daily as needed for mild constipation.   potassium chloride SA 20 MEQ tablet Commonly known as: KLOR-CON Take 1 tablet (20 mEq total) by mouth daily.   traZODone 50 MG  tablet Commonly known as: DESYREL Take 1 tablet (50 mg total) by mouth at bedtime as needed for sleep. What changed: how much to take        Disposition and Follow-up:   Significant Diagnostic Studies:  Dg Fluoro Guide Lumbar Puncture  Result Date: 04/05/2019 CLINICAL DATA:  Diffuse large B-cell lymphoma. Intrathecal methotrexate for CNS prophylaxis, treatment 3. EXAM: FLUOROSCOPICALLY GUIDED LUMBAR PUNCTURE FOR INTRATHECAL CHEMOTHERAPY FLUOROSCOPY TIME:  0 minutes 30 seconds PROCEDURE: Informed consent was obtained from the patient prior to the procedure with the help of the live video translator, including potential complications of headache, allergy, and pain. A time-out was performed prior to the procedure, and the patient's chemotherapy mixture from the pharmacy was confirmed to match the patient's identity. With the patient prone, the lower back was prepped with Betadine. 1% Lidocaine was used for local anesthesia. Lumbar puncture was performed at the L2-3 level using an interlaminar approach with 20 gauge needle with return of clear CSF. 10 cc chemotherapy mixture of 12 mg methotrexate and 50 mg Solu-Cortef was injected into the subarachnoid space. The needle was withdrawn, direct pressure held and no bleeding encountered. The patient tolerated the procedure well and without apparent complication. The patient was returned to the inpatient floor in stable condition. IMPRESSION: 1. Fluoroscopic guided lumbar puncture at L2-3. 2. Injection of intrathecal chemotherapy. Electronically Signed   By: Ilona Sorrel M.D.   On: 04/05/2019 15:19  Dg Fluoro Guide Lumbar Puncture  Result Date: 03/15/2019 CLINICAL DATA:  Fluoro guided intrathecal methotrexate for CNS prophylaxis in the setting of lymphoma. EXAM: Fluoroscopic guided lumbar puncture. Injection of intrathecal chemotherapy. FLUOROSCOPY TIME:  Fluoroscopy Time:  1 minutes Radiation Exposure Index (if provided by the fluoroscopic device): 5.9  mGy Number of Acquired Spot Images: 0 COMPARISON:  02/23/2019 FINDINGS: Informed consent was obtained from the patient prior to the procedure with the help of an interpreter. The risks including headache, allergy and pain were discussed. A time-out was performed and this was also checked against the patient's chemotherapy from the pharmacy. With the patient in the prone position the lower back was prepped and draped with Betadine. 1% lidocaine was utilized for local anesthesia over the site. Lumbar puncture was performed at the L4-5 level with a 3.5 inch 20 gauge needle. After clear CSF flow was confirmed injection of 10 mL chemotherapy mixture of 12 mg methotrexate and 50 mg Solu-Cortef was injected into the space. The patient tolerated the procedure well. Post procedure orders were placed, short note was entered into the chart and the patient was turned to the inpatient floor in stable condition. IMPRESSION: Successful lumbar puncture at L4-5 with injection of intrathecal chemotherapy. Electronically Signed   By: Zetta Bills M.D.   On: 03/15/2019 16:37    Discharge Laboratory Values: . CBC Latest Ref Rng & Units 04/08/2019 04/07/2019 04/06/2019  WBC 4.0 - 10.5 K/uL 2.2(L) 3.9(L) 7.3  Hemoglobin 12.0 - 15.0 g/dL 8.1(L) 8.2(L) 8.1(L)  Hematocrit 36.0 - 46.0 % 25.2(L) 26.3(L) 25.9(L)  Platelets 150 - 400 K/uL 285 286 294    . CMP Latest Ref Rng & Units 04/08/2019 04/07/2019 04/06/2019  Glucose 70 - 99 mg/dL 122(H) 165(H) 129(H)  BUN 6 - 20 mg/dL 16 18 17   Creatinine 0.44 - 1.00 mg/dL 0.54 0.56 0.52  Sodium 135 - 145 mmol/L 138 136 138  Potassium 3.5 - 5.1 mmol/L 3.3(L) 3.2(L) 3.7  Chloride 98 - 111 mmol/L 104 104 108  CO2 22 - 32 mmol/L 24 22 23   Calcium 8.9 - 10.3 mg/dL 8.6(L) 8.6(L) 8.8(L)  Total Protein 6.5 - 8.1 g/dL 5.4(L) 5.5(L) 5.5(L)  Total Bilirubin 0.3 - 1.2 mg/dL 0.4 0.5 0.2(L)  Alkaline Phos 38 - 126 U/L 62 65 69  AST 15 - 41 U/L 19 18 16   ALT 0 - 44 U/L 32 21 23     Brief  H and P: For complete details please refer to admission H and P, but in brief, Denise Walls is a 58 year old femalewith a past medical history significant for anxiety, Crohn's disease, hypertension, hyperlipidemia, migraine headaches.The patient reports that she has history of Crohn's disease, for which she has been taking mesalamine with very good control of her symptoms. She was previously followed by Dr. Kathy Breach gastroenterology and to transition her care to a different gastroenterologist after [facility]oved [facility]. Her new gastroenterologist stopped her mesalamine some time in 2019,and soon after that, she began to develop new onset left lower quadrant discomfort/pain,nonradiating, dull, intermittent, mild to moderate in intensity, exacerbated by eating and improved with defecation. She underwent colonoscopy, which was unremarkable, and was recommended to modify her diet without any improvement. She then re-established her care with Dr. Etter Sjogren ordered CT abdomen/pelvis that showed diffuse abdominal lymphadenopathy. PET showed diffuse lymph node involvement in the neck, chest, abdomen and the pelvis,and patient was referred to oncology for further evaluation. 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 The patient initially received 1 cycle of R-CHOPwhich she tolerated fairly well with exception of hyponatremia and neutropenia. Prior to her second planned cycle of chemotherapy, she was found to be positive for Myc and BCL-2 rearrangement. 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 The intent is for cure. -We discussed some of the risks, benefitsandside-effects of Rituximab,Etoposide, Vincristine, Adriamycin,Cytoxan,Prednisone.The regimen requires inpatient admission due to continuous chemotherapy infusion  -Some of the short term side-effects included, though not limited to, risk of fatigue, weight loss,tumor lysis syndrome, risk of allergic reactions,pancytopenia, life-threatening infections, need for transfusions of blood products, nausea, vomiting, change in bowel habits,hairloss, risk of congestive heart failure, admission to hospital for various reasons, and risks of death.  -Long term side-effects are also discussed includingpermanent damage to nerve function, chronic fatigue, and rare secondary malignancy including bone marrow disorders. -The patient is aware that the response rates discussed earlier is not guaranteed. -After a long discussion, patient made an informed decision to proceed with the prescribed plan of care. -She completed cycle #5of EPOCH without any prohibitive toxicities -she got her 3rd dose of IT MTX without any significant issues.  -She has antiemetics available to her -Labs from today have been reviewed and are adequate for treatment. -We will plan tocontinueintrathecal chemotherapy, plan for 4 treatments -will schedule 4th cycle of IT MTX as outpatient upon clinic followup.  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 -WBC is slightly low at 3.9; ANC normal at 3.5 -We will monitor it for now  Hyponatremia -Na is normal today -Continue Pedialyte and gentle salt addition to the diet, and maintain adequate hydration -We will monitor it closely  Hypokalemia -Potassium 3.2 today -Will increase potassium chloride 20 mEq twice a day. -Recheck CMET in am   Chemotherapy-associated nausea  -Secondary to chemotherapy -Symptoms relatively well controlled  -Continue PRN-anti-emetics  We will plan for Rituxan and Neulasta on 04/11/2019. Follow-up visit with Dr. Irene Limbo with lab work on 04/18/2019. Will setup 4th cycle of IT MTX as outpatient    Physical Exam at Discharge: BP 140/67 (BP Location: Left Arm)    Pulse (!) 59    Temp 97.9 F (36.6 C) (Oral)    Resp 17    Ht 5' (1.524 m)    Wt 131 lb 13.4 oz (59.8 kg)    LMP  (LMP Unknown)    SpO2 98%    BMI 25.75 kg/m  . GENERAL:alert, in no acute distress and comfortable SKIN: no acute rashes, no significant lesions EYES: conjunctiva are pink and non-injected, sclera anicteric OROPHARYNX: MMM, no exudates, no  oropharyngeal erythema or ulceration NECK: supple, no JVD LYMPH:  no palpable lymphadenopathy in the cervical, axillary or inguinal regions LUNGS: clear to auscultation b/l with normal respiratory effort HEART: regular rate & rhythm ABDOMEN:  normoactive bowel sounds , non tender, not distended. Extremity: no pedal edema PSYCH: alert & oriented x 3 with fluent speech NEURO: no focal motor/sensory deficits   Hospital Course:  Active Problems:   Diffuse large B cell lymphoma (HCC)   Encounter for antineoplastic chemotherapy   Diet:  Regular  Activity:  Infection prevention precautions  Condition at Discharge:     Signed: Dr. Sullivan Lone MD  [place] [phone]  04/08/2019, 12:04 PM TT spent discharging patient>109mns

## 2019-04-08 NOTE — Progress Notes (Signed)
Pt discharged home with daughter in stable condition. Discharge instructions given. No immediate questions or concerns from pt or daughter. Discharged from unit via wheelchair.

## 2019-04-11 ENCOUNTER — Other Ambulatory Visit: Payer: Self-pay

## 2019-04-11 ENCOUNTER — Inpatient Hospital Stay: Payer: Commercial Managed Care - PPO

## 2019-04-11 VITALS — BP 107/71 | HR 83 | Temp 98.4°F | Resp 16

## 2019-04-11 DIAGNOSIS — Z5112 Encounter for antineoplastic immunotherapy: Secondary | ICD-10-CM | POA: Diagnosis not present

## 2019-04-11 DIAGNOSIS — C8330 Diffuse large B-cell lymphoma, unspecified site: Secondary | ICD-10-CM

## 2019-04-11 DIAGNOSIS — Z5189 Encounter for other specified aftercare: Secondary | ICD-10-CM | POA: Diagnosis not present

## 2019-04-11 MED ORDER — PEGFILGRASTIM-CBQV 6 MG/0.6ML ~~LOC~~ SOSY
6.0000 mg | PREFILLED_SYRINGE | Freq: Once | SUBCUTANEOUS | Status: AC
  Administered 2019-04-11: 6 mg via SUBCUTANEOUS

## 2019-04-11 MED ORDER — DIPHENHYDRAMINE HCL 25 MG PO CAPS
50.0000 mg | ORAL_CAPSULE | Freq: Once | ORAL | Status: AC
  Administered 2019-04-11: 09:00:00 50 mg via ORAL

## 2019-04-11 MED ORDER — SODIUM CHLORIDE 0.9 % IV SOLN
375.0000 mg/m2 | Freq: Once | INTRAVENOUS | Status: AC
  Administered 2019-04-11: 600 mg via INTRAVENOUS
  Filled 2019-04-11: qty 50

## 2019-04-11 MED ORDER — HEPARIN SOD (PORK) LOCK FLUSH 100 UNIT/ML IV SOLN
500.0000 [IU] | Freq: Once | INTRAVENOUS | Status: AC | PRN
  Administered 2019-04-11: 12:00:00 500 [IU]
  Filled 2019-04-11: qty 5

## 2019-04-11 MED ORDER — SODIUM CHLORIDE 0.9% FLUSH
10.0000 mL | INTRAVENOUS | Status: DC | PRN
  Administered 2019-04-11: 10 mL
  Filled 2019-04-11: qty 10

## 2019-04-11 MED ORDER — ACETAMINOPHEN 325 MG PO TABS
650.0000 mg | ORAL_TABLET | Freq: Once | ORAL | Status: AC
  Administered 2019-04-11: 09:00:00 650 mg via ORAL

## 2019-04-11 MED ORDER — PEGFILGRASTIM-CBQV 6 MG/0.6ML ~~LOC~~ SOSY
PREFILLED_SYRINGE | SUBCUTANEOUS | Status: AC
  Filled 2019-04-11: qty 0.6

## 2019-04-11 MED ORDER — DIPHENHYDRAMINE HCL 25 MG PO CAPS
ORAL_CAPSULE | ORAL | Status: AC
  Filled 2019-04-11: qty 2

## 2019-04-11 MED ORDER — ACETAMINOPHEN 325 MG PO TABS
ORAL_TABLET | ORAL | Status: AC
  Filled 2019-04-11: qty 2

## 2019-04-11 MED ORDER — SODIUM CHLORIDE 0.9 % IV SOLN
Freq: Once | INTRAVENOUS | Status: AC
  Administered 2019-04-11: 09:00:00 via INTRAVENOUS
  Filled 2019-04-11: qty 250

## 2019-04-11 NOTE — Patient Instructions (Signed)
[facility] Discharge Instructions for Patients Receiving Chemotherapy  Today you received the following chemotherapy agents Rituximab (RITUXAN).  To help prevent nausea and vomiting after your treatment, we encourage you to take your nausea medication as prescribed.   If you develop nausea and vomiting that is not controlled by your nausea medication, call the clinic.   BELOW ARE SYMPTOMS THAT SHOULD BE REPORTED IMMEDIATELY:  *FEVER GREATER THAN 100.5 F  *CHILLS WITH OR WITHOUT FEVER  NAUSEA AND VOMITING THAT IS NOT CONTROLLED WITH YOUR NAUSEA MEDICATION  *UNUSUAL SHORTNESS OF BREATH  *UNUSUAL BRUISING OR BLEEDING  TENDERNESS IN MOUTH AND THROAT WITH OR WITHOUT PRESENCE OF ULCERS  *URINARY PROBLEMS  *BOWEL PROBLEMS  UNUSUAL RASH Items with * indicate a potential emergency and should be followed up as soon as possible.  Feel free to call the clinic should you have any questions or concerns. The clinic phone number is [phone].  Please show the [facility] at check-in to the Emergency Department and triage nurse.  Pegfilgrastim injection What is this medicine? PEGFILGRASTIM (PEG fil gra stim) is a long-acting granulocyte colony-stimulating factor that stimulates the growth of neutrophils, a type of white blood cell important in the body's fight against infection. It is used to reduce the incidence of fever and infection in patients with certain types of cancer who are receiving chemotherapy that affects the bone marrow, and to increase survival after being exposed to high doses of radiation. This medicine may be used for other purposes; ask your health care provider or pharmacist if you have questions. COMMON BRAND NAME(S): Steve Rattler, Ziextenzo What should I tell my health care provider before I take this medicine? They need to know if you have any of these conditions:  kidney disease  latex allergy  ongoing radiation  therapy  sickle cell disease  skin reactions to acrylic adhesives (On-Body Injector only)  an unusual or allergic reaction to pegfilgrastim, filgrastim, other medicines, foods, dyes, or preservatives  pregnant or trying to get pregnant  breast-feeding How should I use this medicine? This medicine is for injection under the skin. If you get this medicine at home, you will be taught how to prepare and give the pre-filled syringe or how to use the On-body Injector. Refer to the patient Instructions for Use for detailed instructions. Use exactly as directed. Tell your healthcare provider immediately if you suspect that the On-body Injector may not have performed as intended or if you suspect the use of the On-body Injector resulted in a missed or partial dose. It is important that you put your used needles and syringes in a special sharps container. Do not put them in a trash can. If you do not have a sharps container, call your pharmacist or healthcare provider to get one. Talk to your pediatrician regarding the use of this medicine in children. While this drug may be prescribed for selected conditions, precautions do apply. Overdosage: If you think you have taken too much of this medicine contact a poison control center or emergency room at once. NOTE: This medicine is only for you. Do not share this medicine with others. What if I miss a dose? It is important not to miss your dose. Call your doctor or health care professional if you miss your dose. If you miss a dose due to an On-body Injector failure or leakage, a new dose should be administered as soon as possible using a single prefilled syringe for manual use. What may  interact with this medicine? Interactions have not been studied. Give your health care provider a list of all the medicines, herbs, non-prescription drugs, or dietary supplements you use. Also tell them if you smoke, drink alcohol, or use illegal drugs. Some items may interact  with your medicine. This list may not describe all possible interactions. Give your health care provider a list of all the medicines, herbs, non-prescription drugs, or dietary supplements you use. Also tell them if you smoke, drink alcohol, or use illegal drugs. Some items may interact with your medicine. What should I watch for while using this medicine? You may need blood work done while you are taking this medicine. If you are going to need a MRI, CT scan, or other procedure, tell your doctor that you are using this medicine (On-Body Injector only). What side effects may I notice from receiving this medicine? Side effects that you should report to your doctor or health care professional as soon as possible:  allergic reactions like skin rash, itching or hives, swelling of the face, lips, or tongue  back pain  dizziness  fever  pain, redness, or irritation at site where injected  pinpoint red spots on the skin  red or dark-brown urine  shortness of breath or breathing problems  stomach or side pain, or pain at the shoulder  swelling  tiredness  trouble passing urine or change in the amount of urine Side effects that usually do not require medical attention (report to your doctor or health care professional if they continue or are bothersome):  bone pain  muscle pain This list may not describe all possible side effects. Call your doctor for medical advice about side effects. You may report side effects to FDA at 1-800-FDA-1088. Where should I keep my medicine? Keep out of the reach of children. If you are using this medicine at home, you will be instructed on how to store it. Throw away any unused medicine after the expiration date on the label. NOTE: This sheet is a summary. It may not cover all possible information. If you have questions about this medicine, talk to your doctor, pharmacist, or health care provider.  2020 Elsevier/Gold Standard (2017-08-10  16:57:08)  Coronavirus (COVID-19) Are you at risk?  Are you at risk for the Coronavirus (COVID-19)?  To be considered HIGH RISK for Coronavirus (COVID-19), you have to meet the following criteria:  . Traveled to [place], [place], [place], [place] or [place]; or in the [place] to [place], [place], [place], or [place]; and have fever, cough, and shortness of breath within the last 2 weeks of travel OR . Been in close contact with a person diagnosed with COVID-19 within the last 2 weeks and have fever, cough, and shortness of breath . IF YOU DO NOT MEET THESE CRITERIA, YOU ARE CONSIDERED LOW RISK FOR COVID-19.  What to do if you are HIGH RISK for COVID-19?  Marland Kitchen If you are having a medical emergency, call 911. . Seek medical care right away. Before you go to a doctor's office, urgent care or emergency department, call ahead and tell them about your recent travel, contact with someone diagnosed with COVID-19, and your symptoms. You should receive instructions from your physician's office regarding next steps of care.  . When you arrive at healthcare provider, tell the healthcare staff immediately you have returned from visiting [place], [place], [place], [place] or [place]; or traveled in the [place] to [place], [place], [place], or [place];  in the last two weeks or you have been in close contact with a person diagnosed with COVID-19 in the last 2 weeks.   . Tell the health care staff about your symptoms: fever, cough and shortness of breath. . After you have been seen by a medical provider, you will be either: o Tested for (COVID-19) and discharged home on quarantine except to seek medical care if symptoms worsen, and asked to  - Stay home and avoid contact with others until you get your results (4-5 days)  - Avoid travel on public transportation if possible (such as bus, train, or airplane) or o Sent to the Emergency Department by EMS for evaluation, COVID-19 testing, and  possible admission depending on your condition and test results.  What to do if you are LOW RISK for COVID-19?  Reduce your risk of any infection by using the same precautions used for avoiding the common cold or flu:  Marland Kitchen Wash your hands often with soap and warm water for at least 20 seconds.  If soap and water are not readily available, use an alcohol-based hand sanitizer with at least 60% alcohol.  . If coughing or sneezing, cover your mouth and nose by coughing or sneezing into the elbow areas of your shirt or coat, into a tissue or into your sleeve (not your hands). . Avoid shaking hands with others and consider head nods or verbal greetings only. . Avoid touching your eyes, nose, or mouth with unwashed hands.  . Avoid close contact with people who are sick. . Avoid places or events with large numbers of people in one location, like concerts or sporting events. . Carefully consider travel plans you have or are making. . If you are planning any travel outside or inside the [place], visit the CDC's Travelers' Health webpage for the latest health notices. . If you have some symptoms but not all symptoms, continue to monitor at home and seek medical attention if your symptoms worsen. . If you are having a medical emergency, call 911.   [facility] / e-Visit: [email or web address]         [facility]: [facility]: [phone]                   [facility]: [phone]

## 2019-04-18 ENCOUNTER — Inpatient Hospital Stay (HOSPITAL_BASED_OUTPATIENT_CLINIC_OR_DEPARTMENT_OTHER): Payer: Commercial Managed Care - PPO | Admitting: Hematology

## 2019-04-18 ENCOUNTER — Inpatient Hospital Stay: Payer: Commercial Managed Care - PPO

## 2019-04-18 ENCOUNTER — Other Ambulatory Visit: Payer: Self-pay

## 2019-04-18 VITALS — BP 119/76 | HR 106 | Temp 99.0°F | Resp 18 | Ht 60.0 in | Wt 134.7 lb

## 2019-04-18 DIAGNOSIS — D649 Anemia, unspecified: Secondary | ICD-10-CM | POA: Diagnosis not present

## 2019-04-18 DIAGNOSIS — Z5112 Encounter for antineoplastic immunotherapy: Secondary | ICD-10-CM | POA: Diagnosis not present

## 2019-04-18 DIAGNOSIS — C8330 Diffuse large B-cell lymphoma, unspecified site: Secondary | ICD-10-CM

## 2019-04-18 DIAGNOSIS — C8339 Diffuse large B-cell lymphoma, extranodal and solid organ sites: Secondary | ICD-10-CM

## 2019-04-18 LAB — CBC WITH DIFFERENTIAL (CANCER CENTER ONLY)
Abs Immature Granulocytes: 0.44 10*3/uL — ABNORMAL HIGH (ref 0.00–0.07)
Band Neutrophils: 20 %
Basophils Absolute: 0 10*3/uL (ref 0.0–0.1)
Basophils Relative: 0 %
Blasts: 0 %
Eosinophils Absolute: 0 10*3/uL (ref 0.0–0.5)
Eosinophils Relative: 0 %
HCT: 28.6 % — ABNORMAL LOW (ref 36.0–46.0)
Hemoglobin: 9.1 g/dL — ABNORMAL LOW (ref 12.0–15.0)
Lymphocytes Relative: 9 %
Lymphs Abs: 1.3 10*3/uL (ref 0.7–4.0)
MCH: 30.6 pg (ref 26.0–34.0)
MCHC: 31.8 g/dL (ref 30.0–36.0)
MCV: 96.3 fL (ref 80.0–100.0)
Metamyelocytes Relative: 3 %
Monocytes Absolute: 1.6 10*3/uL — ABNORMAL HIGH (ref 0.1–1.0)
Monocytes Relative: 11 %
Myelocytes: 0 %
Neutro Abs: 11.4 10*3/uL — ABNORMAL HIGH (ref 1.7–7.7)
Neutrophils Relative %: 57 %
Other: 0 %
Platelet Count: 156 10*3/uL (ref 150–400)
Promyelocytes Relative: 0 %
RBC: 2.97 MIL/uL — ABNORMAL LOW (ref 3.87–5.11)
RDW: 19.7 % — ABNORMAL HIGH (ref 11.5–15.5)
WBC Count: 14.7 10*3/uL — ABNORMAL HIGH (ref 4.0–10.5)
nRBC: 0 /100 WBC
nRBC: 0.6 % — ABNORMAL HIGH (ref 0.0–0.2)

## 2019-04-18 LAB — CMP (CANCER CENTER ONLY)
ALT: 31 U/L (ref 0–44)
AST: 21 U/L (ref 15–41)
Albumin: 3.9 g/dL (ref 3.5–5.0)
Alkaline Phosphatase: 99 U/L (ref 38–126)
Anion gap: 12 (ref 5–15)
BUN: 7 mg/dL (ref 6–20)
CO2: 24 mmol/L (ref 22–32)
Calcium: 9.2 mg/dL (ref 8.9–10.3)
Chloride: 103 mmol/L (ref 98–111)
Creatinine: 0.73 mg/dL (ref 0.44–1.00)
GFR, Est AFR Am: >60 mL/min (ref >60)
GFR, Estimated: >60 mL/min (ref >60)
Glucose, Bld: 130 mg/dL — ABNORMAL HIGH (ref 70–99)
Potassium: 3.8 mmol/L (ref 3.5–5.1)
Sodium: 139 mmol/L (ref 135–145)
Total Bilirubin: <0.2 mg/dL — ABNORMAL LOW (ref 0.3–1.2)
Total Protein: 6.3 g/dL — ABNORMAL LOW (ref 6.5–8.1)

## 2019-04-18 NOTE — Progress Notes (Signed)
HEMATOLOGY/ONCOLOGY CLINIC NOTE  Date of Service: 04/18/2019  Patient Care Team: [name], MD as PCP - General (Family Medicine)  CHIEF COMPLAINTS/PURPOSE OF CONSULTATION:  DLBCL (diffuse large B cell lymphoma)   HISTORY OF PRESENTING ILLNESS:  Denise Walls is a wonderful 58 y.o. female with a past medical history significant for anxiety, Crohn's disease, hypertension, hyperlipidemia, migraine headaches.  The patient's history is obtained through a Spanish interpreter via video, [place].  The patient reports that she has history of Crohn's disease, for which she has been taking mesalamine with very good control of her symptoms. She was previously followed by Dr. Kathy Breach gastroenterology and to transition her care to a different gastroenterologist after Dr.Guptamoved to [facility]. Her new gastroenterologist stopped her mesalamine some time in 2019,and soon after that, she began to develop new onset left lower quadrant discomfort/pain,nonradiating, dull, intermittent, mild to moderate in intensity, exacerbated by eating and improved with defecation. She underwent colonoscopy, which was unremarkable, and was recommended to modify her diet without any improvement. She then re-established her care with Dr. Etter Sjogren ordered CT abdomen/pelvis that showed diffuse abdominal lymphadenopathy. PET showed diffuse lymph node involvement in the neck, chest, abdomen and the pelvis,and patient was referred to oncology for further evaluation.  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 The patient initially received 1 cycle of R-CHOP which she tolerated fairly well with exception of hyponatremia and neutropenia.  Prior to her second planned cycle of chemotherapy, she was found to be positive for Myc and BCL-2 rearrangement.  She is now receiving  R-EPOCH and is here for cycle #2 of his  chemotherapy.  Today, the patient reports that she is feeling well overall.  She reports a very good appetite and has been eating a lot at home.  She reports that her breathing is stable and maybe even slightly improved.  Had an episode of dizziness and vomiting and was seen in the ER last week. Symptoms have now resolved. Today, she denies any dizziness, headaches, chest discomfort, cough.  Denies abdominal pain, nausea, vomiting, constipation, diarrhea.  Denies bleeding.  The patient is here for admission for cycle #2 of R-EPOCH.  INTERVAL HISTORY:   Denise Walls is a wonderful 58 y.o. female who is here for evaluation and management of DLBCL. We are joined today by her translator. The patient's last visit with [place] was on 03/28/2019. The pt reports that she is doing well overall.  The pt reports that she has been feeling okay but has been a little down since yesterday. Pt's denies dizziness but reports weakness and fatigue. She has also been experiencing some nausea that goes away with her prescribed medication. She has been able to sleep well without any medication and has continued to eat well. Pt did have one mouth sore, which she could feel but did not cause her any discomfort. 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 Pt does have a benign mass in her left breast that was found a few years ago and has continued to follow-up with her annual mammograms. She also has a few moles that she thinks could be concerning for cancer. Her PCP has been following this for years.   Lab results today (04/18/19) of CBC w/diff and CMP is as follows: all values are WNL except for WBC at 14.7K, RBC at 2.97, Hgb at 9.1, HCT at 28.6, RDW at 19.7, nRBC at 0.6K, Glucose at  130, Total Protein at 6.3, Total Bilirubin at <0.2.  On review of systems, pt reports eating well, sleeping well, fatigue, weakness, nausea, mouth sores, improving neuropathy and denies diarrhea, dizziness and any other symptoms.    MEDICAL HISTORY:  Past Medical History:  Diagnosis Date   Acute cystitis    Anxiety    Arthritis    Crohn disease ([place])    dx 2004, history of small bowel obstruction 02/2007 treated conservatively   History of colon polyps    History of shingles    HTN (hypertension)    Hypercholesterolemia    Insomnia    Migraine    Uterine fibroid     SURGICAL HISTORY: Past Surgical History:  Procedure Laterality Date   COLONOSCOPY  09/11/2014   Small internal hemorrhoids. Otherwise normal colonoscopy to terminal ileum   COLONOSCOPY  10/02/2017   ESOPHAGOGASTRODUODENOSCOPY  04/05/2007   Mild gastritis. Otherwise, normal esophagogastroduodenoscopy   IR IMAGING GUIDED PORT INSERTION  11/29/2018   LYMPH NODE BIOPSY Left 12/09/2018   Procedure: LEFT DEEP CERVICAL LYMPH NODE BIOPSY;  Surgeon: Fanny Skates, MD;  Location: [facility];  Service: General;  Laterality: Left;   TUBAL LIGATION      SOCIAL HISTORY: Social History   Socioeconomic History   Marital status: Married    Spouse name: Not on file   Number of children: Not on file   Years of education: Not on file   Highest education level: Not on file  Occupational History   Not on file  Social Needs   Financial resource strain: Not on file   Food insecurity    Worry: Not on file    Inability: Not on file   Transportation needs    Medical: Not on file    Non-medical: Not on file  Tobacco Use   Smoking status: Never Smoker   Smokeless tobacco: Never Used  Substance and Sexual Activity   Alcohol use: Never    Frequency: Never   Drug use: Never   Sexual activity: Yes    Birth control/protection: Post-menopausal  Lifestyle   Physical activity    Days per week: Not on file    Minutes per session: Not on file   Stress: Not on file  Relationships   Social connections    Talks on phone: Not on file    Gets together: Not on file    Attends religious service: Not on file    Active member of  club or organization: Not on file    Attends meetings of clubs or organizations: Not on file    Relationship status: Not on file   Intimate partner violence    Fear of current or ex partner: Not on file    Emotionally abused: Not on file    Physically abused: Not on file    Forced sexual activity: Not on file  Other Topics Concern   Not on file  Social History Narrative   Not on file    FAMILY HISTORY: Family History  Problem Relation Age of Onset   Colon cancer Neg Hx     ALLERGIES:  is allergic to nsaids.  MEDICATIONS:  Current Outpatient Medications  Medication Sig Dispense Refill   acetaminophen (TYLENOL) 325 MG tablet Take 325-650 mg by mouth every 6 (six) hours as needed for mild pain.     B Complex Vitamins (B COMPLEX PO) Take 1 tablet by mouth daily.     calcium carbonate (TUMS - DOSED IN MG ELEMENTAL CALCIUM) 500 MG  chewable tablet Chew 2 tablets by mouth daily as needed for indigestion or heartburn.     cholecalciferol (VITAMIN D3) 25 MCG (1000 UT) tablet Take 1 tablet (1,000 Units total) by mouth daily. 30 tablet 3   lidocaine-prilocaine (EMLA) cream Apply to affected area once (Patient taking differently: Apply 1 application topically as needed (For port-a-cath.). ) 30 g 3   mesalamine (LIALDA) 1.2 g EC tablet Take 2.4 g by mouth daily.      ondansetron (ZOFRAN) 8 MG tablet Take 1 tablet (8 mg total) by mouth 2 (two) times daily as needed for refractory nausea / vomiting. Start on day 3 after cyclophosphamide chemotherapy. 30 tablet 1   polyethylene glycol (MIRALAX / GLYCOLAX) 17 g packet Take 17 g by mouth daily as needed for mild constipation.     potassium chloride SA (K-DUR) 20 MEQ tablet Take 1 tablet (20 mEq total) by mouth daily. 30 tablet 1   Prenatal Vit-Fe Fumarate-FA (MULTIVITAMIN-PRENATAL) 27-0.8 MG TABS tablet Take 1 tablet by mouth daily at 12 noon.     traZODone (DESYREL) 50 MG tablet Take 1 tablet (50 mg total) by mouth at bedtime as  needed for sleep. (Patient taking differently: Take 25 mg by mouth at bedtime as needed for sleep. ) 30 tablet 3   No current facility-administered medications for this visit.     REVIEW OF SYSTEMS:   A 10+ POINT REVIEW OF SYSTEMS WAS OBTAINED including neurology, dermatology, psychiatry, cardiac, respiratory, lymph, extremities, GI, GU, Musculoskeletal, constitutional, breasts, reproductive, HEENT.  All pertinent positives are noted in the HPI.  All others are negative.   PHYSICAL EXAMINATION: ECOG PERFORMANCE STATUS: 2 - Symptomatic, <50% confined to bed  . Vitals:   04/18/19 0941  BP: 119/76  Pulse: (!) 106  Resp: 18  Temp: 99 F (37.2 C)  SpO2: 98%   Filed Weights   04/18/19 0941  Weight: 134 lb 11.2 oz (61.1 kg)   .Body mass index is 26.31 kg/m.   GENERAL:alert, in no acute distress and comfortable SKIN: no acute rashes, no significant lesions EYES: conjunctiva are pink and non-injected, sclera anicteric OROPHARYNX: MMM, no exudates, no oropharyngeal erythema or ulceration NECK: supple, no JVD LYMPH:  no palpable lymphadenopathy in the cervical, axillary or inguinal regions LUNGS: clear to auscultation b/l with normal respiratory effort HEART: regular rate & rhythm ABDOMEN:  normoactive bowel sounds , non tender, not distended. No palpable hepatosplenomegaly.  Extremity: no pedal edema PSYCH: alert & oriented x 3 with fluent speech NEURO: no focal motor/sensory deficits  LABORATORY DATA:  I have reviewed the data as listed  . CBC Latest Ref Rng & Units 04/18/2019 04/08/2019 04/07/2019  WBC 4.0 - 10.5 K/uL 14.7(H) 2.2(L) 3.9(L)  Hemoglobin 12.0 - 15.0 g/dL 9.1(L) 8.1(L) 8.2(L)  Hematocrit 36.0 - 46.0 % 28.6(L) 25.2(L) 26.3(L)  Platelets 150 - 400 K/uL 156 285 286    . CMP Latest Ref Rng & Units 04/18/2019 04/08/2019 04/07/2019  Glucose 70 - 99 mg/dL 130(H) 122(H) 165(H)  BUN 6 - 20 mg/dL 7 16 18   Creatinine 0.44 - 1.00 mg/dL 0.73 0.54 0.56  Sodium 135 -  145 mmol/L 139 138 136  Potassium 3.5 - 5.1 mmol/L 3.8 3.3(L) 3.2(L)  Chloride 98 - 111 mmol/L 103 104 104  CO2 22 - 32 mmol/L 24 24 22   Calcium 8.9 - 10.3 mg/dL 9.2 8.6(L) 8.6(L)  Total Protein 6.5 - 8.1 g/dL 6.3(L) 5.4(L) 5.5(L)  Total Bilirubin 0.3 - 1.2 mg/dL <0.2(L) 0.4 0.5  Alkaline Phos 38 - 126 U/L 99 62 65  AST 15 - 41 U/L 21 19 18   ALT 0 - 44 U/L 31 32 21     RADIOGRAPHIC STUDIES: I have personally reviewed the radiological images as listed and agreed with the findings in the report. Dg Fluoro Guide Lumbar Puncture  Result Date: 04/05/2019 CLINICAL DATA:  Diffuse large B-cell lymphoma. Intrathecal methotrexate for CNS prophylaxis, treatment 3. EXAM: FLUOROSCOPICALLY GUIDED LUMBAR PUNCTURE FOR INTRATHECAL CHEMOTHERAPY FLUOROSCOPY TIME:  0 minutes 30 seconds PROCEDURE: Informed consent was obtained from the patient prior to the procedure with the help of the live video translator, including potential complications of headache, allergy, and pain. A time-out was performed prior to the procedure, and the patient's chemotherapy mixture from the pharmacy was confirmed to match the patient's identity. With the patient prone, the lower back was prepped with Betadine. 1% Lidocaine was used for local anesthesia. Lumbar puncture was performed at the L2-3 level using an interlaminar approach with 20 gauge needle with return of clear CSF. 10 cc chemotherapy mixture of 12 mg methotrexate and 50 mg Solu-Cortef was injected into the subarachnoid space. The needle was withdrawn, direct pressure held and no bleeding encountered. The patient tolerated the procedure well and without apparent complication. The patient was returned to the inpatient floor in stable condition. IMPRESSION: 1. Fluoroscopic guided lumbar puncture at L2-3. 2. Injection of intrathecal chemotherapy. Electronically Signed   By: Ilona Sorrel M.D.   On: 04/05/2019 15:19    ASSESSMENT & PLAN:   1. 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 The intent is for cure. -We discussed some of the risks, benefitsandside-effects of Rituximab,Etoposide, Vincristine, Adriamycin,Cytoxan,Prednisone.The regimen requires inpatient admission due to continuous chemotherapy infusion  -Some of the short term side-effects included, though not limited to, risk of fatigue, weight loss,tumor lysis syndrome, risk of allergic reactions,pancytopenia, life-threatening infections, need for transfusions of blood products, nausea, vomiting, change in bowel habits,hairloss, risk of congestive heart failure, admission to hospital for various reasons, and risks of death.  -Long term side-effects are also discussed includingpermanent damage to nerve function, chronic fatigue, and rare secondary malignancy including bone marrow disorders. -The patient is aware that the response rates discussed earlier is not guaranteed. -After a long discussion, patient made an informed decision to proceed with the prescribed plan of care. -She has antiemetics available to her  02/14/2019 PET Scan Whole Body ([id number]) revealed "1. Findings favor complete metabolic response. No residual hypermetabolism within the lymph nodes of the neck, chest, abdomen or pelvis, which have all decreased in size in the interval. 2. Nonspecific new diffuse skeletal hypermetabolism and new mild splenic hypermetabolism, favor reactive state of the marrow and reticuloendothelial system. Spleen is normal size. Continued surveillance with CT or PET-CT advised. 3. Small layering left pleural effusion, decreased. 4.   Aortic Atherosclerosis (ICD10-I70.0)."   PLAN:  -Discussed pt labwork today, 04/18/19; WBC at 14.7K, RBC at 2.97, Hgb at 9.1, HCT at 28.6, RDW at 19.7, nRBC at 0.6K, Glucose at 130, Total Protein at 6.3, Total Bilirubin at <0.2. -Improving anemia. No indication for blood transfusion at this time.  -Recommended salt/baking soda rinse to  help with mouth sores. -Continue B-complex vitamin - neuropathy improving -Pt has had 3 intrathecal Methotrexate treatments, discussed continuing with the 4th treatment in 1 week  -Advised pt that although the benefits of her 4th intrathecal chemotherapy are marginal, it is standard practice  -Pt would like to proceed with the 4th intrathecal Methotrexate treatement - will set up in 1 week as outpatient  -The pt completed 5 cycles of R-EPOCH on 04/04/2019, 6 total cycles of chemotherapy (1st cycle was R-CHOP) -Advised pt that we recommend waiting 4-6 months after chemotherapy for elective treatments and surgeries -Recommended pt f/u with physician at [facility] [place] for annual mammogram next due in 06/2019 -Recommended pt f/u with PCP for moles on skin -Plan for post-treatment PET/CT in 5 weeks  -Will see back in 6 weeks with labs  2. 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 Normocytic anemia -Secondary to anemia of chronic disease  -Hgb is 9.1 today -Patient denies any symptoms of bleeding -We will monitor it for now   4. Leukocytosis -related to G-CSF -no symptoms suggestive of infection.  5. Hyponatremia- resolved -[id number] today  6. Hypokalemia -K3.8 today -Has been on potassium chloride 20 mEq daily as an outpatient in the past. Will hold off on ordering for now. Monitor K+ daily.   FOLLOW UP: -IR for lumbar puncture for  Intrathecal Methotrexate in 1 week -PET/CT in 5 weeks -RTC with Dr Irene Limbo with labs in 6 weeks  The total time spent in the appt was 25 minutes and more than 50% was on counseling and direct patient cares.  All of the patient's questions were answered with apparent satisfaction. The patient knows to call the clinic with any problems, questions or concerns.   Sullivan Lone MD [place] AAHIVMS [facility] Hematology/Oncology Physician [facility]  (Office):       [phone] (Work cell):  [phone] (Fax):           [phone]  04/18/2019 10:22 AM  I, Yevette Edwards, am acting as a scribe for Dr. Sullivan Lone.   .I have reviewed the above documentation for accuracy and completeness, and I agree with the above. [name] MD

## 2019-04-19 ENCOUNTER — Telehealth: Payer: Self-pay | Admitting: Hematology

## 2019-04-19 NOTE — Telephone Encounter (Signed)
Scheduled appt per 11/30 los.  Spoke with pt daughter and she is aware of the appt date and time.

## 2019-04-27 ENCOUNTER — Other Ambulatory Visit: Payer: Self-pay

## 2019-04-27 ENCOUNTER — Ambulatory Visit (HOSPITAL_COMMUNITY)
Admission: RE | Admit: 2019-04-27 | Discharge: 2019-04-27 | Disposition: A | Discharge status: Home / Self Care | Payer: Commercial Managed Care - PPO | Source: Ambulatory Visit | Attending: Hematology | Admitting: Hematology

## 2019-04-27 ENCOUNTER — Ambulatory Visit (HOSPITAL_COMMUNITY): Payer: Commercial Managed Care - PPO

## 2019-04-27 VITALS — BP 118/79 | HR 74 | Temp 99.0°F | Resp 16

## 2019-04-27 DIAGNOSIS — C8330 Diffuse large B-cell lymphoma, unspecified site: Secondary | ICD-10-CM

## 2019-04-27 DIAGNOSIS — C8339 Diffuse large B-cell lymphoma, extranodal and solid organ sites: Secondary | ICD-10-CM | POA: Insufficient documentation

## 2019-04-27 DIAGNOSIS — Z5111 Encounter for antineoplastic chemotherapy: Secondary | ICD-10-CM | POA: Insufficient documentation

## 2019-04-27 DIAGNOSIS — D649 Anemia, unspecified: Secondary | ICD-10-CM

## 2019-04-27 MED ORDER — SODIUM CHLORIDE (PF) 0.9 % IJ SOLN
Freq: Once | INTRAMUSCULAR | Status: AC
  Administered 2019-04-27: 12:00:00 via INTRATHECAL
  Filled 2019-04-27: qty 0.48

## 2019-04-27 MED ORDER — DEXAMETHASONE 6 MG PO TABS
10.0000 mg | ORAL_TABLET | Freq: Once | ORAL | Status: AC
  Administered 2019-04-27: 10 mg via ORAL
  Filled 2019-04-27: qty 1

## 2019-04-27 MED ORDER — ONDANSETRON HCL 8 MG PO TABS
8.0000 mg | ORAL_TABLET | Freq: Once | ORAL | Status: AC
  Administered 2019-04-27: 11:00:00 8 mg via ORAL
  Filled 2019-04-27: qty 1

## 2019-04-27 MED ORDER — SODIUM CHLORIDE 0.9 % IV SOLN
Freq: Once | INTRAVENOUS | Status: DC
  Filled 2019-04-27: qty 4

## 2019-04-27 MED ORDER — LIDOCAINE HCL 1 % IJ SOLN
INTRAMUSCULAR | Status: AC
  Filled 2019-04-27: qty 20

## 2019-04-27 NOTE — Procedures (Signed)
Lumbar puncture performed at L2-3 with intrathecal chemotherapy injection. Patient tolerated procedure well, no complications. Details dictated in Radiology report.

## 2019-04-27 NOTE — Discharge Instructions (Signed)
injecction a la espina, cuidados posteriores spinal Injection, Care After Siga estas instrucciones durante las prximas semanas. Estas indicaciones le proporcionan informacin acerca de cmo deber cuidarse despus del procedimiento. El mdico tambin podr darle instrucciones ms especficas. El tratamiento ha sido planificado segn las prcticas mdicas actuales, pero en algunos casos pueden ocurrir problemas. Comunquese con el mdico si tiene algn problema o dudas despus del procedimiento. Qu puedo esperar despus del procedimiento? Despus del procedimiento, es normal sentir molestias leves en el lugar de la inyeccin. Siga estas indicaciones en su casa:  Durante 24horas despus del procedimiento: ? Evite el calor en el lugar de la inyeccin. ? No tome baos de inmersin y no se sumerja en agua. ? No conduzca si le administraron un medicamento para ayudarlo a que se relaje (sedante).  Si se lo indican, aplique hielo en el lugar de la inyeccin: ? Ponga el hielo en una bolsa plstica. ? Coloque una [employer] piel y la bolsa de hielo. ? Coloque el hielo durante 34mnutos, 2 a 3veces por da.  Retome sus actividades normales como se lo haya indicado el mdico. Pregntele al mdico qu actividades son seguras para usted.  Puede retirarse las vendas (vendaje) despus de 24horas.  Tome los medicamentos de venta libre y los recetados solamente como se lo haya indicado el mdico.  CConsulting civil engineera todas las visitas de control como se lo haya indicado el mdico. Esto es importante. Comunquese con un mdico si:  Tiene fiebre.  Contina sintiendo dManagement consultantde la inyeccin, incluso despus de tomar un analgsico de vRadio broadcast assistant  Tiene nuseas o vmitos intensos, repentinos o prolongados. Solicite ayuda de inmediato si:  SPassenger transport managerde la inyeccin, que no se alivia con medicamentos.  Presenta un fuerte dolor de cabeza o rigidez en el  cuello.  Tiene sensibilidad a lNaval architect  Siente una nueva forma de debilidad o adormecimiento en los brazos o en las piernas.  Comienza a perder el control de la vejiga o los movimientos intestinales.  Tiene dificultad para respirar. Esta informacin no tiene cMarine scientistel consejo del mdico. Asegrese de hacerle al mdico cualquier pregunta que tenga. Document Released: 01/05/2013 Document Revised: 08/08/2016 Document Reviewed: 08/21/2015 Elsevier Patient Education  2Oasis Methotrexate injection QSander Nephewes este medicamento? El METOTREXATO es un medicamento quimioteraputico que se uSouth Georgia and the South Sand[place] Islandspara tratar distintos tipos de cncer, tales como cncer de mama, leucemia y linfoma. Este medicamento tambin puede usarse para tratar la psoriasis y ciertos tipos de artritis. Este medicamento puede ser utilizado para otros usos; si tiene alguna pregunta consulte con su proveedor de atencin mdica o con su farmacutico. Qu le debo informar a mi profesional de la salud antes de tomar este medicamento? Necesitan saber si usted presenta alguno de los siguientes problemas o situaciones: lquido en el rea del estmago o los pulmones si bebe alcohol con frecuencia infeccin o problemas del sistema inmunolgico enfermedad renal enfermedad heptica recuentos sanguneos bajos, como baja cantidad de glbulos blancos, plaquetas o glbulos rojos enfermedad pulmonar terapia de radiacin lceras estomacales colitis ulcerativa una reaccin alrgica o inusual al metotrexato, a otros medicamentos, alimentos, colorantes o conservantes si est embarazada o buscando quedar embarazada si est amamantando a un beb Cmo debo utilizar este medicamento? Este medicamento se administra mediante infusin en una vena, o mediante inyeccin en un msculo o en el lquido cefalorraqudeo (lo que corresponda). Generalmente lo administra un profesional de lTechnical sales engineeren un hospital o  en un entorno clnico. En raras  ocasiones, es posible que reciba este medicamento en su casa. Le ensearn cmo Biomedical engineer. selo exactamente como se le indique. Tome su medicamento a intervalos regulares. No tome su medicamento con una frecuencia mayor a la indicada. Si este medicamento se [place] para artritis o psoriasis, se debe tomar semanalmente, NO diariamente. Es importante que deseche las agujas y las jeringas usadas en un recipiente resistente a los pinchazos. No los deseche en la basura. Si no tiene un recipiente resistente a los pinchazos, consulte a Midwife o proveedor de atencin de la salud para obtenerlo. Hable con su pediatra para informarse acerca del uso de este medicamento en nios. Aunque este medicamento se puede recetar a nios tan pequeos como de 2 aos de edad en casos selectos, existen precauciones que deben tomarse. Sobredosis: Pngase en contacto inmediatamente con un centro toxicolgico o una sala de urgencia si usted cree que haya tomado demasiado medicamento. ATENCIN: [employer] es solo para usted. No comparta este medicamento con nadie. Qu sucede si me olvido de una dosis? Es importante no olvidar ninguna dosis. Informe a su mdico o a su profesional de la salud si no puede asistir a Photographer. Si usted se administra el frmaco y [place] dosis, hable con su mdico o profesional de [employer]	[place]. No tome dosis adicionales o dobles. Qu puede interactuar con este medicamento? Esta medicina puede interactuar con los siguientes medicamentos: acitretina aspirina o medicamentos tipo aspirina, incluso salicilatos azatioprina ciertos antibiticos, como cloranfenicol, penicilina, tetraciclina ciertos medicamentos para problemas estomacales, tales como esomeprazol, omeprazol, pantoprazol ciclosporina oro hidroxicloroquina vacunas de virus vivos mercaptopurina AINE, medicamentos para el dolor y la inflamacin, como ibuprofeno y naproxeno otros agentes citotxicos penicilamina  fenilbutazona fenitona probenacida retinoides, tales como isotretinona y tretinona medicamentos esteroideos, como la prednisona o la cortisona sulfonamidas, tales como sulfasalazina y trimetoprima/sulfametoxasol teofilina Puede ser que esta lista no menciona todas las posibles interacciones. Informe a su profesional de [employer]	[place] de [employer] productos a base de hierbas, medicamentos de [place] o suplementos nutritivos que est tomando. Si usted fuma, consume bebidas alcohlicas o si utiliza drogas ilegales, indqueselo tambin a su profesional de [employer]	[place]. Algunas sustancias pueden interactuar con su medicamento. A qu debo estar atento al usar Coca[employer]Cola? Evite consumir bebidas alcohlicas. En algunos casos, podra recibir [employer] para ayudarlo con los efectos secundarios. Siga todas las instrucciones para usarlos. Este medicamento puede aumentar su sensibilidad al sol. Evite la [employer]. Si no la Product manager, utilice ropa protectora y crema de Photographer. No utilice lmparas solares, camas solares ni cabinas solares. Puede experimentar somnolencia o mareos. No conduzca, no utilice maquinaria ni haga nada que Associate Professor en estado de alerta hasta que sepa cmo le afecta este medicamento. No se siente ni se ponga de pie con rapidez, especialmente si es un paciente de edad avanzada. Esto reduce el riesgo de mareos o [employer]. Usted podra necesitar realizarse [employer] de sangre mientras est usando [place]. Consulte a su mdico o a su profesional de la salud si tiene fiebre, escalofros o dolor de garganta, o cualquier otro sntoma de resfro o gripe. No se trate usted mismo. Este medicamento reduce la capacidad del cuerpo para combatir infecciones. Trate de no acercarse a personas que estn enfermas. Este medicamento podra aumentar el riesgo de moretones o sangrado. Consulte a su mdico o a su profesional de la salud si observa sangrados  inusuales. Consulte con su mdico  o su profesional de la salud si tiene un ataque de diarrea grave, nuseas y vmitos, o sudoracin intensa. La prdida de demasiado lquido corporal puede hacer que sea peligroso tomar Coca[employer]Cola. Consulte con su mdico acerca de su riesgo de cncer. Usted puede tener mayor riesgo para ciertos tipos de cncer si toma este medicamento. Tanto hombres como mujeres deben usar un mtodo anticonceptivo eficaz con este medicamento. No debe quedar embarazada mientras est [employer], o al menos hasta que tenga 1 ciclo menstrual normal despus de dejar de usarlo. Las mujeres deben informar a su mdico si estn buscando quedar embarazadas o si creen que podran estar embarazadas. Los hombres no deben tener hijos mientras estn recibiendo Coca-Cola y [place] 3 meses despus de dejar de usarlo. Existe la posibilidad de efectos secundarios graves en un beb sin nacer. Para obtener ms informacin, hable con su profesional de la salud o su farmacutico. No debe amamantar a un beb mientras est tomando este medicamento. Qu efectos secundarios puedo tener al [employer] este medicamento? Efectos secundarios que debe informar a su mdico o a Barrister's clerk de la salud tan pronto como sea posible: Chief of Staff, como erupcin cutnea, comezn/picazn o urticarias, e hinchazn de la cara, los labios o la lengua dolor de espalda problemas respiratorios o falta de aire confusin diarrea tos seca, improductiva recuentos sanguneos bajos: este medicamento podra reducir la cantidad de glbulos blancos, glbulos rojos y plaquetas. Su riesgo de infeccin y sangrado puede ser mayor llagas en la boca enrojecimiento, formacin de ampollas, descamacin o distensin de la piel, incluso dentro de la boca convulsiones dolores de cabeza graves signos de infeccin: fiebre o escalofros, tos, dolor de Investment banker, operational, Social research officer, government o dificultad para Garment/textile technologist signos y sntomas de Teacher, music, tales  como heces con sangre o de color negro y Curator alquitranado; [place] de color rojo o marrn oscuro; escupir sangre o Sales executive que tiene el aspecto de granos de caf molido; Tree surgeon rojas en la piel; sangrado o moretones inusuales en los ojos, las encas o la nariz signos y sntomas de lesin al rin, tales como dificultad para orinar o cambios en la cantidad de orina signos y sntomas de lesin al hgado, como orina amarilla oscura o [place]; sensacin general de estar enfermo o sntomas gripales; heces claras; prdida de apetito; nuseas; dolor en la regin abdominal superior derecha; cansancio o debilidad inusual; color amarillento de los ojos o la piel cuello rgido vmito Efectos secundarios que generalmente no requieren atencin mdica (infrmelos a su mdico o a Barrister's clerk de la salud si persisten o si son molestos): mareos cada del cabello dolor de Engineer, maintenance Puede ser que esta lista no menciona todos los posibles efectos secundarios. Comunquese a su mdico por asesoramiento mdico [employer]. Usted puede informar los efectos secundarios a la FDA por telfono al 1-800-FDA-1088. Dnde debo guardar mi medicina? Si est [employer] en su casa, le indicarn cmo almacenarlo. Deseche todo el medicamento que no haya utilizado despus de la fecha de vencimiento indicada en la etiqueta. ATENCIN: Este folleto es un resumen. Puede ser que no cubra toda la posible informacin. Si usted tiene preguntas acerca de esta medicina, consulte con su mdico, su farmacutico o su profesional de Technical sales engineer.  2020 Elsevier/Gold Standard (2017-04-13 00:00:00)

## 2019-05-03 ENCOUNTER — Other Ambulatory Visit: Payer: Self-pay | Admitting: Hematology

## 2019-05-03 DIAGNOSIS — G4700 Insomnia, unspecified: Secondary | ICD-10-CM

## 2019-05-23 ENCOUNTER — Encounter (HOSPITAL_COMMUNITY)
Admission: RE | Admit: 2019-05-23 | Discharge: 2019-05-23 | Disposition: A | Discharge status: Home / Self Care | Payer: Commercial Managed Care - PPO | Source: Ambulatory Visit | Attending: Hematology | Admitting: Hematology

## 2019-05-23 ENCOUNTER — Other Ambulatory Visit: Payer: Self-pay

## 2019-05-23 DIAGNOSIS — C8339 Diffuse large B-cell lymphoma, extranodal and solid organ sites: Secondary | ICD-10-CM

## 2019-05-23 DIAGNOSIS — D649 Anemia, unspecified: Secondary | ICD-10-CM | POA: Insufficient documentation

## 2019-05-23 DIAGNOSIS — C8330 Diffuse large B-cell lymphoma, unspecified site: Secondary | ICD-10-CM | POA: Diagnosis present

## 2019-05-23 LAB — GLUCOSE, CAPILLARY: Glucose-Capillary: 93 mg/dL (ref 70–99)

## 2019-05-23 MED ORDER — FLUDEOXYGLUCOSE F - 18 (FDG) INJECTION
6.6000 | Freq: Once | INTRAVENOUS | Status: AC | PRN
  Administered 2019-05-23: 6.6 via INTRAVENOUS

## 2019-05-25 ENCOUNTER — Telehealth: Payer: Self-pay | Admitting: *Deleted

## 2019-05-25 NOTE — Telephone Encounter (Signed)
Records faxed to [facility] - release [id number]

## 2019-05-30 ENCOUNTER — Inpatient Hospital Stay: Payer: Commercial Managed Care - PPO | Attending: Hematology

## 2019-05-30 ENCOUNTER — Inpatient Hospital Stay (HOSPITAL_BASED_OUTPATIENT_CLINIC_OR_DEPARTMENT_OTHER): Payer: Commercial Managed Care - PPO | Admitting: Hematology

## 2019-05-30 ENCOUNTER — Telehealth: Payer: Self-pay | Admitting: Hematology

## 2019-05-30 ENCOUNTER — Other Ambulatory Visit: Payer: Self-pay

## 2019-05-30 VITALS — BP 146/88 | HR 83 | Temp 98.0°F | Resp 18 | Ht 60.0 in | Wt 139.6 lb

## 2019-05-30 DIAGNOSIS — D638 Anemia in other chronic diseases classified elsewhere: Secondary | ICD-10-CM | POA: Diagnosis not present

## 2019-05-30 DIAGNOSIS — R519 Headache, unspecified: Secondary | ICD-10-CM | POA: Diagnosis not present

## 2019-05-30 DIAGNOSIS — N898 Other specified noninflammatory disorders of vagina: Secondary | ICD-10-CM | POA: Insufficient documentation

## 2019-05-30 DIAGNOSIS — C8330 Diffuse large B-cell lymphoma, unspecified site: Secondary | ICD-10-CM

## 2019-05-30 DIAGNOSIS — E876 Hypokalemia: Secondary | ICD-10-CM | POA: Diagnosis not present

## 2019-05-30 DIAGNOSIS — D649 Anemia, unspecified: Secondary | ICD-10-CM

## 2019-05-30 DIAGNOSIS — D72829 Elevated white blood cell count, unspecified: Secondary | ICD-10-CM | POA: Diagnosis not present

## 2019-05-30 LAB — CMP (CANCER CENTER ONLY)
ALT: 35 U/L (ref 0–44)
AST: 26 U/L (ref 15–41)
Albumin: 4.2 g/dL (ref 3.5–5.0)
Alkaline Phosphatase: 100 U/L (ref 38–126)
Anion gap: 11 (ref 5–15)
BUN: 12 mg/dL (ref 6–20)
CO2: 25 mmol/L (ref 22–32)
Calcium: 9 mg/dL (ref 8.9–10.3)
Chloride: 104 mmol/L (ref 98–111)
Creatinine: 0.69 mg/dL (ref 0.44–1.00)
GFR, Est AFR Am: >60 mL/min (ref >60)
GFR, Estimated: >60 mL/min (ref >60)
Glucose, Bld: 96 mg/dL (ref 70–99)
Potassium: 3.8 mmol/L (ref 3.5–5.1)
Sodium: 140 mmol/L (ref 135–145)
Total Bilirubin: 0.3 mg/dL (ref 0.3–1.2)
Total Protein: 6.8 g/dL (ref 6.5–8.1)

## 2019-05-30 LAB — LACTATE DEHYDROGENASE: LDH: 190 U/L (ref 98–192)

## 2019-05-30 LAB — CBC WITH DIFFERENTIAL/PLATELET
Abs Immature Granulocytes: 0.01 10*3/uL (ref 0.00–0.07)
Basophils Absolute: 0.1 10*3/uL (ref 0.0–0.1)
Basophils Relative: 1 %
Eosinophils Absolute: 0.1 10*3/uL (ref 0.0–0.5)
Eosinophils Relative: 3 %
HCT: 35.9 % — ABNORMAL LOW (ref 36.0–46.0)
Hemoglobin: 11.8 g/dL — ABNORMAL LOW (ref 12.0–15.0)
Immature Granulocytes: 0 %
Lymphocytes Relative: 28 %
Lymphs Abs: 1.2 10*3/uL (ref 0.7–4.0)
MCH: 30.5 pg (ref 26.0–34.0)
MCHC: 32.9 g/dL (ref 30.0–36.0)
MCV: 92.8 fL (ref 80.0–100.0)
Monocytes Absolute: 0.3 10*3/uL (ref 0.1–1.0)
Monocytes Relative: 7 %
Neutro Abs: 2.7 10*3/uL (ref 1.7–7.7)
Neutrophils Relative %: 61 %
Platelets: 215 10*3/uL (ref 150–400)
RBC: 3.87 MIL/uL (ref 3.87–5.11)
RDW: 14.6 % (ref 11.5–15.5)
WBC: 4.4 10*3/uL (ref 4.0–10.5)
nRBC: 0 % (ref 0.0–0.2)

## 2019-05-30 NOTE — Progress Notes (Signed)
HEMATOLOGY/ONCOLOGY CLINIC NOTE  Date of Service: 05/30/2019  Patient Care Team: [name], MD as PCP - General (Family Medicine)  CHIEF COMPLAINTS/PURPOSE OF CONSULTATION:  DLBCL (diffuse large B cell lymphoma)   HISTORY OF PRESENTING ILLNESS:  Denise Walls is a wonderful 59 y.o. female with a past medical history significant for anxiety, Crohn's disease, hypertension, hyperlipidemia, migraine headaches.  The patient's history is obtained through a Spanish interpreter via video, [place].  The patient reports that she has history of Crohn's disease, for which she has been taking mesalamine with very good control of her symptoms. She was previously followed by Dr. Kathy Breach gastroenterology and to transition her care to a different gastroenterologist after Dr.Guptamoved to [facility]. Her new gastroenterologist stopped her mesalamine some time in 2019,and soon after that, she began to develop new onset left lower quadrant discomfort/pain,nonradiating, dull, intermittent, mild to moderate in intensity, exacerbated by eating and improved with defecation. She underwent colonoscopy, which was unremarkable, and was recommended to modify her diet without any improvement. She then re-established her care with Dr. Etter Sjogren ordered CT abdomen/pelvis that showed diffuse abdominal lymphadenopathy. PET showed diffuse lymph node involvement in the neck, chest, abdomen and the pelvis,and patient was referred to oncology for further evaluation.  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 The patient initially received 1 cycle of R-CHOP which she tolerated fairly well with exception of hyponatremia and neutropenia.  Prior to her second planned cycle of chemotherapy, she was found to be positive for Myc and BCL-2 rearrangement.  She is now receiving  R-EPOCH and is here for cycle #2 of his  chemotherapy.  Today, the patient reports that she is feeling well overall.  She reports a very good appetite and has been eating a lot at home.  She reports that her breathing is stable and maybe even slightly improved.  Had an episode of dizziness and vomiting and was seen in the ER last week. Symptoms have now resolved. Today, she denies any dizziness, headaches, chest discomfort, cough.  Denies abdominal pain, nausea, vomiting, constipation, diarrhea.  Denies bleeding.  The patient is here for admission for cycle #2 of R-EPOCH.  INTERVAL HISTORY:   Denise Walls is a wonderful 59 y.o. female who is here for evaluation and management of DLBCL. We are joined today by her translator. The patient's last visit with [place] was on 04/18/2019. The pt reports that she is doing well overall.  The pt reports some days she feels well and some days not so much. She has been having intermittent headaches. She is able to control this with little pain medication. Pt is having some vaginal itching but is not experiencing any vaginal discharge. Her eyes have also been itching lately. She has been eating well.   She is also interested in returning to work, due to her employer's insistence.   Of note since the patient's last visit, pt has had PET/CT scan ([id number]) completed on 05/23/2019 with results revealing "Small calcified jejunal mesentery nodes, compatible with treated lymphoma. Deauville category 2. No findings suspicious for active lymphoma. Prior splenic and osseous hypermetabolism is no longer evident."  Lab results today (05/30/19) of CBC w/diff and CMP is as follows: all values are WNL except for Hgb at 11.8, HCT at 35.9. 05/30/2019 LDH at 190  On review of systems, pt reports headaches, vaginal itching, ocular itching, eating well and denies abnormal vaginal discharge, abdominal  pain and any other symptoms.   MEDICAL HISTORY:  Past Medical History:  Diagnosis Date  . Acute cystitis   .  Anxiety   . Arthritis   . Crohn disease ([place])    dx 2004, history of small bowel obstruction 02/2007 treated conservatively  . History of colon polyps   . History of shingles   . HTN (hypertension)   . Hypercholesterolemia   . Insomnia   . Migraine   . Uterine fibroid     SURGICAL HISTORY: Past Surgical History:  Procedure Laterality Date  . COLONOSCOPY  09/11/2014   Small internal hemorrhoids. Otherwise normal colonoscopy to terminal ileum  . COLONOSCOPY  10/02/2017  . ESOPHAGOGASTRODUODENOSCOPY  04/05/2007   Mild gastritis. Otherwise, normal esophagogastroduodenoscopy  . IR IMAGING GUIDED PORT INSERTION  11/29/2018  . LYMPH NODE BIOPSY Left 12/09/2018   Procedure: LEFT DEEP CERVICAL LYMPH NODE BIOPSY;  Surgeon: Fanny Skates, MD;  Location: [facility];  Service: General;  Laterality: Left;  . TUBAL LIGATION      SOCIAL HISTORY: Social History   Socioeconomic History  . Marital status: Married    Spouse name: Not on file  . Number of children: Not on file  . Years of education: Not on file  . Highest education level: Not on file  Occupational History  . Not on file  Tobacco Use  . Smoking status: Never Smoker  . Smokeless tobacco: Never Used  Substance and Sexual Activity  . Alcohol use: Never  . Drug use: Never  . Sexual activity: Yes    Birth control/protection: Post-menopausal  Other Topics Concern  . Not on file  Social History Narrative  . Not on file   Social Determinants of Health   Financial Resource Strain:   . Difficulty of Paying Living Expenses: Not on file  Food Insecurity:   . Worried About Charity fundraiser in the Last Year: Not on file  . Ran Out of Food in the Last Year: Not on file  Transportation Needs:   . Lack of Transportation (Medical): Not on file  . Lack of Transportation (Non-Medical): Not on file  Physical Activity:   . Days of Exercise per Week: Not on file  . Minutes of Exercise per Session: Not on file  Stress:   . Feeling  of Stress : Not on file  Social Connections:   . Frequency of Communication with Friends and Family: Not on file  . Frequency of Social Gatherings with Friends and Family: Not on file  . Attends Religious Services: Not on file  . Active Member of Clubs or Organizations: Not on file  . Attends Archivist Meetings: Not on file  . Marital Status: Not on file  Intimate Partner Violence:   . Fear of Current or Ex-Partner: Not on file  . Emotionally Abused: Not on file  . Physically Abused: Not on file  . Sexually Abused: Not on file    FAMILY HISTORY: Family History  Problem Relation Age of Onset  . Colon cancer Neg Hx     ALLERGIES:  is allergic to nsaids.  MEDICATIONS:  Current Outpatient Medications  Medication Sig Dispense Refill  . acetaminophen (TYLENOL) 325 MG tablet Take 325-650 mg by mouth every 6 (six) hours as needed for mild pain.    . B Complex Vitamins (B COMPLEX PO) Take 1 tablet by mouth daily.    . calcium carbonate (TUMS - DOSED IN MG ELEMENTAL CALCIUM) 500 MG chewable tablet Chew 2 tablets  by mouth daily as needed for indigestion or heartburn.    . cholecalciferol (VITAMIN D3) 25 MCG (1000 UT) tablet Take 1 tablet (1,000 Units total) by mouth daily. 30 tablet 3  . lidocaine-prilocaine (EMLA) cream Apply to affected area once (Patient taking differently: Apply 1 application topically as needed (For port-a-cath.). ) 30 g 3  . mesalamine (LIALDA) 1.2 g EC tablet Take 2.4 g by mouth daily.     . ondansetron (ZOFRAN) 8 MG tablet Take 1 tablet (8 mg total) by mouth 2 (two) times daily as needed for refractory nausea / vomiting. Start on day 3 after cyclophosphamide chemotherapy. 30 tablet 1  . polyethylene glycol (MIRALAX / GLYCOLAX) 17 g packet Take 17 g by mouth daily as needed for mild constipation.    . potassium chloride SA (K-DUR) 20 MEQ tablet Take 1 tablet (20 mEq total) by mouth daily. 30 tablet 1  . Prenatal Vit-Fe Fumarate-FA (MULTIVITAMIN-PRENATAL)  27-0.8 MG TABS tablet Take 1 tablet by mouth daily at 12 noon.    . traZODone (DESYREL) 50 MG tablet Take 1 tablet (50 mg total) by mouth at bedtime as needed for sleep. (Patient taking differently: Take 25 mg by mouth at bedtime as needed for sleep. ) 30 tablet 3   No current facility-administered medications for this visit.    REVIEW OF SYSTEMS:   A 10+ POINT REVIEW OF SYSTEMS WAS OBTAINED including neurology, dermatology, psychiatry, cardiac, respiratory, lymph, extremities, GI, GU, Musculoskeletal, constitutional, breasts, reproductive, HEENT.  All pertinent positives are noted in the HPI.  All others are negative.   PHYSICAL EXAMINATION: ECOG PERFORMANCE STATUS: 2 - Symptomatic, <50% confined to bed  . Vitals:   05/30/19 0943  BP: (!) 146/88  Pulse: 83  Resp: 18  Temp: 98 F (36.7 C)  SpO2: 100%   Filed Weights   05/30/19 0943  Weight: 139 lb 9.6 oz (63.3 kg)   .Body mass index is 27.26 kg/m.   GENERAL:alert, in no acute distress and comfortable SKIN: no acute rashes, no significant lesions EYES: conjunctiva are pink and non-injected, sclera anicteric OROPHARYNX: MMM, no exudates, no oropharyngeal erythema or ulceration NECK: supple, no JVD LYMPH:  no palpable lymphadenopathy in the cervical, axillary or inguinal regions LUNGS: clear to auscultation b/l with normal respiratory effort HEART: regular rate & rhythm ABDOMEN:  normoactive bowel sounds , non tender, not distended. No palpable hepatosplenomegaly.  Extremity: no pedal edema PSYCH: alert & oriented x 3 with fluent speech NEURO: no focal motor/sensory deficits  LABORATORY DATA:  I have reviewed the data as listed  . CBC Latest Ref Rng & Units 04/18/2019 04/08/2019 04/07/2019  WBC 4.0 - 10.5 K/uL 14.7(H) 2.2(L) 3.9(L)  Hemoglobin 12.0 - 15.0 g/dL 9.1(L) 8.1(L) 8.2(L)  Hematocrit 36.0 - 46.0 % 28.6(L) 25.2(L) 26.3(L)  Platelets 150 - 400 K/uL 156 285 286    . CMP Latest Ref Rng & Units 04/18/2019  04/08/2019 04/07/2019  Glucose 70 - 99 mg/dL 130(H) 122(H) 165(H)  BUN 6 - 20 mg/dL 7 16 18   Creatinine 0.44 - 1.00 mg/dL 0.73 0.54 0.56  Sodium 135 - 145 mmol/L 139 138 136  Potassium 3.5 - 5.1 mmol/L 3.8 3.3(L) 3.2(L)  Chloride 98 - 111 mmol/L 103 104 104  CO2 22 - 32 mmol/L 24 24 22   Calcium 8.9 - 10.3 mg/dL 9.2 8.6(L) 8.6(L)  Total Protein 6.5 - 8.1 g/dL 6.3(L) 5.4(L) 5.5(L)  Total Bilirubin 0.3 - 1.2 mg/dL <0.2(L) 0.4 0.5  Alkaline Phos 38 - 126 U/L  99 62 65  AST 15 - 41 U/L 21 19 18   ALT 0 - 44 U/L 31 32 21     RADIOGRAPHIC STUDIES: I have personally reviewed the radiological images as listed and agreed with the findings in the report. NM PET Image Restag (PS) Skull Base To Thigh  Result Date: 05/23/2019 CLINICAL DATA:  Subsequent treatment strategy for diffuse large B-cell lymphoma, status post 6 cycles of chemoimmunotherapy. EXAM: NUCLEAR MEDICINE PET SKULL BASE TO THIGH TECHNIQUE: 6.6 mCi F-18 FDG was injected intravenously. Full-ring PET imaging was performed from the skull base to thigh after the radiotracer. CT data was obtained and used for attenuation correction and anatomic localization. Fasting blood glucose: 93 mg/dl COMPARISON:  02/14/2019 FINDINGS: Mediastinal blood pool activity: SUV max 2.0 Liver activity: SUV max 3.2 NECK: No hypermetabolic cervical lymphadenopathy. Incidental CT findings: none CHEST: No hypermetabolic thoracic adenopathy. No suspicious pulmonary nodules. Right chest port terminates the cavoatrial junction. Incidental CT findings: Atherosclerotic calcifications of the aortic arch. Mild coronary atherosclerosis of the LAD. ABDOMEN/PELVIS: No hypermetabolic abdominopelvic lymphadenopathy. Small calcified jejunal mesenteric nodes measuring up to 8 mm short axis (series 4/image 125), compatible with treated lymphoma, max SUV 1.8. Mild mesenteric stranding along the jejunal mesentery. No focal splenic metabolism.  Representative max SUV 2.1. No abnormal  hypermetabolism in the liver, pancreas, or adrenal glands. Incidental CT findings: Mild hepatic steatosis. Mild atherosclerotic calcifications the abdominal aorta. SKELETON: No focal hypermetabolic activity to suggest skeletal metastasis. Prior heterogeneous osseous hypermetabolism related to marrow hyperstimulation is no longer evident. Incidental CT findings: Degenerative changes of the visualized thoracolumbar spine. IMPRESSION: Small calcified jejunal mesentery nodes, compatible with treated lymphoma. Deauville category 2. No findings suspicious for active lymphoma. Prior splenic and osseous hypermetabolism is no longer evident. Electronically Signed   By: Julian Hy M.D.   On: 05/23/2019 10:37    ASSESSMENT & PLAN:   1. 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 The intent is for cure. -We discussed some of the risks, benefitsandside-effects of Rituximab,Etoposide, Vincristine, Adriamycin,Cytoxan,Prednisone.The regimen requires inpatient admission due to continuous chemotherapy infusion  -Some of the short term side-effects included, though not limited to, risk of fatigue, weight loss,tumor lysis syndrome, risk of allergic reactions,pancytopenia, life-threatening infections, need for transfusions of blood products, nausea, vomiting, change in bowel habits,hairloss, risk of congestive heart failure, admission to hospital for various reasons, and risks of death.  -Long term side-effects are also discussed includingpermanent damage to nerve function, chronic fatigue, and rare secondary malignancy including bone  marrow disorders. -The patient is aware that the response rates discussed earlier is not guaranteed. -After a long discussion, patient made an informed decision to proceed with the prescribed plan of care. -She has antiemetics available to her  02/14/2019 PET Scan Whole Body ([id number]) revealed "1. Findings favor complete metabolic response. No residual hypermetabolism within the lymph nodes of the neck, chest, abdomen or pelvis, which have all decreased in size in the interval. 2. Nonspecific new diffuse skeletal hypermetabolism and new mild splenic hypermetabolism, favor reactive state of the marrow and reticuloendothelial system. Spleen is normal size. Continued surveillance with CT or PET-CT advised. 3. Small layering left pleural effusion,  decreased. 4.  Aortic Atherosclerosis (ICD10-I70.0)."  -Completed 5 cycles of R-EPOCH on 04/04/2019, 6 total cycles of chemotherapy (1st cycle was R-CHOP) -Completed 4 cycles of IT METHOTREXATE on 04/25/2019  05/23/2019 PET/CT scan ([id number]) revealed "Small calcified jejunal mesentery nodes, compatible with treated lymphoma. Deauville category 2. No findings suspicious for active lymphoma. Prior splenic and osseous hypermetabolism is no longer evident."  PLAN:  -Discussed pt labwork today, 05/30/19; blood counts improving, resolving anemia, blood chemistries look good -Discussed 05/30/2019 LDH is greatly improved at 190 -Discussed 05/23/2019 PET/CT scan ([id number]) which revealed "No finding suspicious for active lymphoma, Prior splenic and osseous hypermetabolism is no longer evident".  -No evidence of active lymhpoma at this time -Advised pt that chance of relapse is highest in the first two years, after which the risk is less than 5% per year -Will need to monitor every 3 months with labs and clinic visits for the first two years -Recommended that the pt focus on eating well, drinking at least 48-64 oz of water each day, and staying as active as  possible.   -Recommended pt increase fresh fruits and vegetables in diet and avoid processed foods as much as possible -Continue B-complex vitamin - neuropathy improving -Recommended pt wipe her eyes with non-irritating soap twice per day -Recommend pt receive COVID19 vaccine when available  -Advised pt that her immune system is still compromised and to take that into consideration when deciding to return to work -Advised pt that if vaginal itching continues after using Fluconazole to f/u with Dr. Selena Batten -Plan to schedule Port-a-cath removal in 2 months if next labs are stable -Rx Fluconazole  -Will see back in 2 months with labs  2. 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 Normocytic anemia -Secondary to anemia of chronic disease  -Hgb is 9.1 today -Patient denies any symptoms of bleeding -We will monitor it for now   4. Leukocytosis -related to G-CSF -no symptoms suggestive of infection.  5. Hyponatremia- resolved -[id number] today  6. Hypokalemia -K3.8 today -Has been on potassium chloride 20 mEq daily as an outpatient in the past. Will hold off on ordering for now. Monitor K+ daily.   FOLLOW UP: RTC with Dr Irene Limbo with portflush and labs in 2 months  The total time spent in the appt was 30 minutes and more than 50% was on counseling and direct patient cares.  All of the patient's questions were answered with apparent satisfaction. The patient knows to call the clinic with any problems, questions or concerns.   Sullivan Lone MD [place] AAHIVMS [facility] [place] Hematology/Oncology Physician [facility]  (Office):       [phone] (Work cell):  [phone] (Fax):           [phone]  05/30/2019 4:40 AM  I, Yevette Edwards, am acting as a scribe for Dr. Sullivan Lone.    .I have reviewed the above documentation for accuracy and completeness, and I agree with the above. [name] MD

## 2019-05-30 NOTE — Telephone Encounter (Signed)
Scheduled appt per 1/11 los.  Sent a message to HIM pool to get a calendar mailed out.

## 2019-06-23 ENCOUNTER — Telehealth: Payer: Self-pay | Admitting: *Deleted

## 2019-06-23 NOTE — Telephone Encounter (Signed)
Connected with daughter Yasmin.  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  A lot of people work around her are testing positive for COVID-virus."   "She tires and fatigues easily.  No energy to return to work.  Tires trying to do normal home duties and trouble focusing."

## 2019-07-24 ENCOUNTER — Encounter: Payer: Self-pay | Admitting: Hematology

## 2019-07-27 NOTE — Progress Notes (Signed)
HEMATOLOGY/ONCOLOGY CLINIC NOTE  Date of Service: 07/27/2019  Patient Care Team: [name], MD as PCP - General (Family Medicine)  CHIEF COMPLAINTS/PURPOSE OF CONSULTATION:  DLBCL (diffuse large B cell lymphoma)   HISTORY OF PRESENTING ILLNESS:  Denise Walls is a wonderful 59 y.o. female with a past medical history significant for anxiety, Crohn's disease, hypertension, hyperlipidemia, migraine headaches.  The patient's history is obtained through a Spanish interpreter via video, [place].  The patient reports that she has history of Crohn's disease, for which she has been taking mesalamine with very good control of her symptoms. She was previously followed by Dr. Kathy Breach gastroenterology and to transition her care to a different gastroenterologist after Dr.Guptamoved to [facility]. Her new gastroenterologist stopped her mesalamine some time in 2019,and soon after that, she began to develop new onset left lower quadrant discomfort/pain,nonradiating, dull, intermittent, mild to moderate in intensity, exacerbated by eating and improved with defecation. She underwent colonoscopy, which was unremarkable, and was recommended to modify her diet without any improvement. She then re-established her care with Dr. Etter Sjogren ordered CT abdomen/pelvis that showed diffuse abdominal lymphadenopathy. PET showed diffuse lymph node involvement in the neck, chest, abdomen and the pelvis,and patient was referred to oncology for further evaluation.  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 The patient initially received 1 cycle of R-CHOP which she tolerated fairly well with exception of hyponatremia and neutropenia.  Prior to her second planned cycle of chemotherapy, she was found to be positive for Myc and BCL-2 rearrangement.  She is now receiving  R-EPOCH and is here for cycle #2 of his  chemotherapy.  Today, the patient reports that she is feeling well overall.  She reports a very good appetite and has been eating a lot at home.  She reports that her breathing is stable and maybe even slightly improved.  Had an episode of dizziness and vomiting and was seen in the ER last week. Symptoms have now resolved. Today, she denies any dizziness, headaches, chest discomfort, cough.  Denies abdominal pain, nausea, vomiting, constipation, diarrhea.  Denies bleeding.  The patient is here for admission for cycle #2 of R-EPOCH.  INTERVAL HISTORY:   Denise Walls is a wonderful 59 y.o. female who is here for evaluation and management of DLBCL. We are joined today by her translator. The patient's last visit with [place] was on 05/30/2019. The pt reports that she is doing well overall.  The pt reports she is getting better everyday. She feels that she is stiff in her entire body. She has been walking more as the weather gets better. Pt has been having trouble with left arm and that it is more difficult to move, but when she feels stiff she try's to move around more. Pt's mother has passed last month and pt is less worried about people seeing her now. Pt is scheduled to be getting her COVID19 vaccination today.   Lab results today (07/28/19) of CBC w/diff and CMP is as follows: all values are WNL except for Total Protein at 6.3, Total Bilirubin at 0.2.  On review of systems, pt reports stiffness and denies abdominal pain, and any other symptoms.    MEDICAL HISTORY:  Past Medical History:  Diagnosis Date  . Acute cystitis   . Anxiety   . Arthritis   . Crohn disease ([place])    dx 2004, history of small bowel obstruction 02/2007 treated  conservatively  . History of colon polyps   . History of shingles   . HTN (hypertension)   . Hypercholesterolemia   . Insomnia   . Migraine   . Uterine fibroid     SURGICAL HISTORY: Past Surgical History:  Procedure Laterality Date  . COLONOSCOPY   09/11/2014   Small internal hemorrhoids. Otherwise normal colonoscopy to terminal ileum  . COLONOSCOPY  10/02/2017  . ESOPHAGOGASTRODUODENOSCOPY  04/05/2007   Mild gastritis. Otherwise, normal esophagogastroduodenoscopy  . IR IMAGING GUIDED PORT INSERTION  11/29/2018  . LYMPH NODE BIOPSY Left 12/09/2018   Procedure: LEFT DEEP CERVICAL LYMPH NODE BIOPSY;  Surgeon: Fanny Skates, MD;  Location: [facility];  Service: General;  Laterality: Left;  . TUBAL LIGATION      SOCIAL HISTORY: Social History   Socioeconomic History  . Marital status: Married    Spouse name: Not on file  . Number of children: Not on file  . Years of education: Not on file  . Highest education level: Not on file  Occupational History  . Not on file  Tobacco Use  . Smoking status: Never Smoker  . Smokeless tobacco: Never Used  Substance and Sexual Activity  . Alcohol use: Never  . Drug use: Never  . Sexual activity: Yes    Birth control/protection: Post-menopausal  Other Topics Concern  . Not on file  Social History Narrative  . Not on file   Social Determinants of Health   Financial Resource Strain:   . Difficulty of Paying Living Expenses: Not on file  Food Insecurity:   . Worried About Charity fundraiser in the Last Year: Not on file  . Ran Out of Food in the Last Year: Not on file  Transportation Needs:   . Lack of Transportation (Medical): Not on file  . Lack of Transportation (Non-Medical): Not on file  Physical Activity:   . Days of Exercise per Week: Not on file  . Minutes of Exercise per Session: Not on file  Stress:   . Feeling of Stress : Not on file  Social Connections:   . Frequency of Communication with Friends and Family: Not on file  . Frequency of Social Gatherings with Friends and Family: Not on file  . Attends Religious Services: Not on file  . Active Member of Clubs or Organizations: Not on file  . Attends Archivist Meetings: Not on file  . Marital Status: Not on file   Intimate Partner Violence:   . Fear of Current or Ex-Partner: Not on file  . Emotionally Abused: Not on file  . Physically Abused: Not on file  . Sexually Abused: Not on file    FAMILY HISTORY: Family History  Problem Relation Age of Onset  . Colon cancer Neg Hx     ALLERGIES:  is allergic to nsaids.  MEDICATIONS:  Current Outpatient Medications  Medication Sig Dispense Refill  . acetaminophen (TYLENOL) 325 MG tablet Take 325-650 mg by mouth every 6 (six) hours as needed for mild pain.    . B Complex Vitamins (B COMPLEX PO) Take 1 tablet by mouth daily.    . calcium carbonate (TUMS - DOSED IN MG ELEMENTAL CALCIUM) 500 MG chewable tablet Chew 2 tablets by mouth daily as needed for indigestion or heartburn.    . cholecalciferol (VITAMIN D3) 25 MCG (1000 UT) tablet Take 1 tablet (1,000 Units total) by mouth daily. 30 tablet 3  . lidocaine-prilocaine (EMLA) cream Apply to affected area once (Patient taking differently:  Apply 1 application topically as needed (For port-a-cath.). ) 30 g 3  . mesalamine (LIALDA) 1.2 g EC tablet Take 2.4 g by mouth daily.     . ondansetron (ZOFRAN) 8 MG tablet Take 1 tablet (8 mg total) by mouth 2 (two) times daily as needed for refractory nausea / vomiting. Start on day 3 after cyclophosphamide chemotherapy. 30 tablet 1  . polyethylene glycol (MIRALAX / GLYCOLAX) 17 g packet Take 17 g by mouth daily as needed for mild constipation.    . potassium chloride SA (K-DUR) 20 MEQ tablet Take 1 tablet (20 mEq total) by mouth daily. 30 tablet 1  . Prenatal Vit-Fe Fumarate-FA (MULTIVITAMIN-PRENATAL) 27-0.8 MG TABS tablet Take 1 tablet by mouth daily at 12 noon.    . traZODone (DESYREL) 50 MG tablet Take 1 tablet (50 mg total) by mouth at bedtime as needed for sleep. (Patient taking differently: Take 25 mg by mouth at bedtime as needed for sleep. ) 30 tablet 3   No current facility-administered medications for this visit.    REVIEW OF SYSTEMS:   A 10+ POINT REVIEW  OF SYSTEMS WAS OBTAINED including neurology, dermatology, psychiatry, cardiac, respiratory, lymph, extremities, GI, GU, Musculoskeletal, constitutional, breasts, reproductive, HEENT.  All pertinent positives are noted in the HPI.  All others are negative.    PHYSICAL EXAMINATION: ECOG PERFORMANCE STATUS: 2 - Symptomatic, <50% confined to bed  . Vitals:   07/28/19 1050  BP: (!) 157/83  Pulse: 69  Resp: 16  Temp: 98.7 F (37.1 C)  SpO2: 100%   Filed Weights   07/28/19 1050  Weight: 147 lb 1.6 oz (66.7 kg)   .Body mass index is 28.73 kg/m.   GENERAL:alert, in no acute distress and comfortable SKIN: no acute rashes, no significant lesions EYES: conjunctiva are pink and non-injected, sclera anicteric OROPHARYNX: MMM, no exudates, no oropharyngeal erythema or ulceration NECK: supple, no JVD LYMPH:  no palpable lymphadenopathy in the cervical, axillary or inguinal regions LUNGS: clear to auscultation b/l with normal respiratory effort HEART: regular rate & rhythm ABDOMEN:  normoactive bowel sounds , non tender, not distended. Extremity: no pedal edema PSYCH: alert & oriented x 3 with fluent speech NEURO: no focal motor/sensory deficits  LABORATORY DATA:  I have reviewed the data as listed  . CBC Latest Ref Rng & Units 07/28/2019 05/30/2019 04/18/2019  WBC 4.0 - 10.5 K/uL 4.4 4.4 14.7(H)  Hemoglobin 12.0 - 15.0 g/dL 12.1 11.8(L) 9.1(L)  Hematocrit 36.0 - 46.0 % 37.0 35.9(L) 28.6(L)  Platelets 150 - 400 K/uL 217 215 156    . CMP Latest Ref Rng & Units 07/28/2019 05/30/2019 04/18/2019  Glucose 70 - 99 mg/dL 97 96 130(H)  BUN 6 - 20 mg/dL 13 12 7   Creatinine 0.44 - 1.00 mg/dL 0.73 0.69 0.73  Sodium 135 - 145 mmol/L 141 140 139  Potassium 3.5 - 5.1 mmol/L 3.9 3.8 3.8  Chloride 98 - 111 mmol/L 106 104 103  CO2 22 - 32 mmol/L 27 25 24   Calcium 8.9 - 10.3 mg/dL 9.1 9.0 9.2  Total Protein 6.5 - 8.1 g/dL 6.3(L) 6.8 6.3(L)  Total Bilirubin 0.3 - 1.2 mg/dL 0.2(L) 0.3 <0.2(L)    Alkaline Phos 38 - 126 U/L 115 100 99  AST 15 - 41 U/L 26 26 21   ALT 0 - 44 U/L 31 35 31   . Lab Results  Component Value Date   LDH 190 05/30/2019      RADIOGRAPHIC STUDIES: I have personally reviewed the radiological  images as listed and agreed with the findings in the report. No results found.  ASSESSMENT & PLAN:   1. Stage IV DLBCL, GCB subtype;double hitlymphoma -S/p 1 cycle of R-CHOP and 5 cycles of da EPOCH-R and IT MTX x 4 for CNS prophylaxis -FISH  on 01/03/2019 and were positive for Myc and BCL2 rearrangement  02/14/2019 PET Scan Whole Body ([id number]) revealed "1. Findings favor complete metabolic response. No residual hypermetabolism within the lymph nodes of the neck, chest, abdomen or pelvis, which have all decreased in size in the interval. 2. Nonspecific new diffuse skeletal hypermetabolism and new mild splenic hypermetabolism, favor reactive state of the marrow and reticuloendothelial system. Spleen is normal size. Continued surveillance with CT or PET-CT advised. 3. Small layering left pleural effusion, decreased. 4.  Aortic Atherosclerosis (ICD10-I70.0)."  -Completed 5 cycles of R-EPOCH on 04/04/2019, 6 total cycles of chemotherapy (1st cycle was R-CHOP) -Completed 4 cycles of IT METHOTREXATE on 04/25/2019  05/23/2019 PET/CT scan ([id number]) revealed "Small calcified jejunal mesentery nodes, compatible with treated lymphoma. Deauville category 2. No findings suspicious for active lymphoma. Prior splenic and osseous hypermetabolism is no longer evident."  PLAN:  -Discussed pt labwork today, 07/28/19; of CBC w/diff and CMP is as follows: all values are WNL except for Total Protein at 6.3, Total Bilirubin at 0.2. - no clinical or lab evidence of lymphoma progression at this time. LDH WNL --Recommended staying active  -Recommends f/u with dentist after receiving covid 19  vaccine  2. 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 Normocytic anemia Resolved hgb 12.1 today.  FOLLOW UP: RTC with Dr Irene Limbo with portflush and labs in 3 months   The total time spent in the appt was 15 minutes and more than 50% was on counseling and direct patient cares.  All of the patient's questions were answered with apparent satisfaction. The patient knows to call the clinic with any problems, questions or concerns.   Sullivan Lone MD [place] AAHIVMS [facility] Hematology/Oncology Physician [facility]  (Office):       [phone] (Work cell):  [phone] (Fax):           [phone]  07/27/2019 8:33 PM  I, Dawayne Cirri am acting as a Education administrator for Dr. Sullivan Lone.   .I have reviewed the above documentation for accuracy and completeness, and I agree with the above. [name] MD

## 2019-07-28 ENCOUNTER — Telehealth: Payer: Self-pay | Admitting: Hematology

## 2019-07-28 ENCOUNTER — Inpatient Hospital Stay: Payer: Self-pay

## 2019-07-28 ENCOUNTER — Inpatient Hospital Stay: Payer: Self-pay | Attending: Hematology | Admitting: Hematology

## 2019-07-28 ENCOUNTER — Other Ambulatory Visit: Payer: Self-pay

## 2019-07-28 VITALS — BP 157/83 | HR 69 | Temp 98.7°F | Resp 16 | Ht 60.0 in | Wt 147.1 lb

## 2019-07-28 DIAGNOSIS — C8339 Diffuse large B-cell lymphoma, extranodal and solid organ sites: Secondary | ICD-10-CM

## 2019-07-28 DIAGNOSIS — Z95828 Presence of other vascular implants and grafts: Secondary | ICD-10-CM

## 2019-07-28 DIAGNOSIS — C8330 Diffuse large B-cell lymphoma, unspecified site: Secondary | ICD-10-CM | POA: Insufficient documentation

## 2019-07-28 LAB — CBC WITH DIFFERENTIAL (CANCER CENTER ONLY)
Abs Immature Granulocytes: 0.01 10*3/uL (ref 0.00–0.07)
Basophils Absolute: 0 10*3/uL (ref 0.0–0.1)
Basophils Relative: 1 %
Eosinophils Absolute: 0.1 10*3/uL (ref 0.0–0.5)
Eosinophils Relative: 1 %
HCT: 37 % (ref 36.0–46.0)
Hemoglobin: 12.1 g/dL (ref 12.0–15.0)
Immature Granulocytes: 0 %
Lymphocytes Relative: 28 %
Lymphs Abs: 1.2 10*3/uL (ref 0.7–4.0)
MCH: 30 pg (ref 26.0–34.0)
MCHC: 32.7 g/dL (ref 30.0–36.0)
MCV: 91.6 fL (ref 80.0–100.0)
Monocytes Absolute: 0.4 10*3/uL (ref 0.1–1.0)
Monocytes Relative: 10 %
Neutro Abs: 2.7 10*3/uL (ref 1.7–7.7)
Neutrophils Relative %: 60 %
Platelet Count: 217 10*3/uL (ref 150–400)
RBC: 4.04 MIL/uL (ref 3.87–5.11)
RDW: 13.2 % (ref 11.5–15.5)
WBC Count: 4.4 10*3/uL (ref 4.0–10.5)
nRBC: 0 % (ref 0.0–0.2)

## 2019-07-28 LAB — CMP (CANCER CENTER ONLY)
ALT: 31 U/L (ref 0–44)
AST: 26 U/L (ref 15–41)
Albumin: 3.9 g/dL (ref 3.5–5.0)
Alkaline Phosphatase: 115 U/L (ref 38–126)
Anion gap: 8 (ref 5–15)
BUN: 13 mg/dL (ref 6–20)
CO2: 27 mmol/L (ref 22–32)
Calcium: 9.1 mg/dL (ref 8.9–10.3)
Chloride: 106 mmol/L (ref 98–111)
Creatinine: 0.73 mg/dL (ref 0.44–1.00)
GFR, Est AFR Am: >60 mL/min (ref >60)
GFR, Estimated: >60 mL/min (ref >60)
Glucose, Bld: 97 mg/dL (ref 70–99)
Potassium: 3.9 mmol/L (ref 3.5–5.1)
Sodium: 141 mmol/L (ref 135–145)
Total Bilirubin: 0.2 mg/dL — ABNORMAL LOW (ref 0.3–1.2)
Total Protein: 6.3 g/dL — ABNORMAL LOW (ref 6.5–8.1)

## 2019-07-28 MED ORDER — HEPARIN SOD (PORK) LOCK FLUSH 100 UNIT/ML IV SOLN
500.0000 [IU] | Freq: Once | INTRAVENOUS | Status: AC
  Administered 2019-07-28: 500 [IU] via INTRAVENOUS
  Filled 2019-07-28: qty 5

## 2019-07-28 MED ORDER — SODIUM CHLORIDE 0.9% FLUSH
10.0000 mL | INTRAVENOUS | Status: DC | PRN
  Administered 2019-07-28: 10 mL via INTRAVENOUS
  Filled 2019-07-28: qty 10

## 2019-07-28 NOTE — Telephone Encounter (Signed)
Scheduled per 03/11 los, patient will be notified by my chart.

## 2019-10-27 NOTE — Progress Notes (Signed)
HEMATOLOGY/ONCOLOGY CLINIC NOTE  Date of Service: 10/28/2019  Patient Care Team: [name], MD as PCP - General (Family Medicine)  CHIEF COMPLAINTS/PURPOSE OF CONSULTATION:  DLBCL (diffuse large B cell lymphoma)   HISTORY OF PRESENTING ILLNESS:  Denise Walls is a wonderful 59 y.o. female with a past medical history significant for anxiety, Crohn's disease, hypertension, hyperlipidemia, migraine headaches.  The patient's history is obtained through a Spanish interpreter via video, [place].  The patient reports that she has history of Crohn's disease, for which she has been taking mesalamine with very good control of her symptoms. She was previously followed by Dr. Kathy Breach gastroenterology and to transition her care to a different gastroenterologist after Dr.Guptamoved to [facility]. Her new gastroenterologist stopped her mesalamine some time in 2019,and soon after that, she began to develop new onset left lower quadrant discomfort/pain,nonradiating, dull, intermittent, mild to moderate in intensity, exacerbated by eating and improved with defecation. She underwent colonoscopy, which was unremarkable, and was recommended to modify her diet without any improvement. She then re-established her care with Dr. Etter Sjogren ordered CT abdomen/pelvis that showed diffuse abdominal lymphadenopathy. PET showed diffuse lymph node involvement in the neck, chest, abdomen and the pelvis,and patient was referred to oncology for further evaluation.  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 The patient initially received 1 cycle of R-CHOP which she tolerated fairly well with exception of hyponatremia and neutropenia.  Prior to her second planned cycle of chemotherapy, she was found to be positive for Myc and BCL-2 rearrangement.  She is now receiving  R-EPOCH and is here for cycle #2 of his  chemotherapy.  Today, the patient reports that she is feeling well overall.  She reports a very good appetite and has been eating a lot at home.  She reports that her breathing is stable and maybe even slightly improved.  Had an episode of dizziness and vomiting and was seen in the ER last week. Symptoms have now resolved. Today, she denies any dizziness, headaches, chest discomfort, cough.  Denies abdominal pain, nausea, vomiting, constipation, diarrhea.  Denies bleeding.  The patient is here for admission for cycle #2 of R-EPOCH.  INTERVAL HISTORY:   Denise Walls is a wonderful 59 y.o. female who is here for evaluation and management of DLBCL. We are joined today by her translator. The patient's last visit with [place] was on 07/28/19. The pt reports that she is doing well overall.  The pt reports she is good. Pt has felt well and continues feeling even better. She still has tingling/numbess in hands and feet. When she is sitting for long periods of time she feels stiff when she gets up and still has weakness in arm when lifting/reaching above head. Pt is still feeling itchiness. She has been having reflux at night time.   Lab results today (10/28/19) of CBC w/diff and CMP is as follows: all values are WNL except for Alkaline Phosphatase at 140 10/28/19 of LDH at 205  On review of systems, pt reports staying active, little bit of tingling/numbness in hands and feet, itchiness  and denies any other symptoms.    MEDICAL HISTORY:  Past Medical History:  Diagnosis Date  . Acute cystitis   . Anxiety   . Arthritis   . Crohn disease ([place])    dx 2004, history of small bowel obstruction 02/2007 treated conservatively  . History of colon polyps   .  History of shingles   . HTN (hypertension)   . Hypercholesterolemia   . Insomnia   . Migraine   . Uterine fibroid     SURGICAL HISTORY: Past Surgical History:  Procedure Laterality Date  . COLONOSCOPY  09/11/2014   Small internal  hemorrhoids. Otherwise normal colonoscopy to terminal ileum  . COLONOSCOPY  10/02/2017  . ESOPHAGOGASTRODUODENOSCOPY  04/05/2007   Mild gastritis. Otherwise, normal esophagogastroduodenoscopy  . IR IMAGING GUIDED PORT INSERTION  11/29/2018  . LYMPH NODE BIOPSY Left 12/09/2018   Procedure: LEFT DEEP CERVICAL LYMPH NODE BIOPSY;  Surgeon: Fanny Skates, MD;  Location: [place];  Service: General;  Laterality: Left;  . TUBAL LIGATION      SOCIAL HISTORY: Social History   Socioeconomic History  . Marital status: Married    Spouse name: Not on file  . Number of children: Not on file  . Years of education: Not on file  . Highest education level: Not on file  Occupational History  . Not on file  Tobacco Use  . Smoking status: Never Smoker  . Smokeless tobacco: Never Used  Vaping Use  . Vaping Use: Never used  Substance and Sexual Activity  . Alcohol use: Never  . Drug use: Never  . Sexual activity: Yes    Birth control/protection: Post-menopausal  Other Topics Concern  . Not on file  Social History Narrative  . Not on file   Social Determinants of Health   Financial Resource Strain:   . Difficulty of Paying Living Expenses:   Food Insecurity:   . Worried About Charity fundraiser in the Last Year:   . Arboriculturist in the Last Year:   Transportation Needs:   . Film/video editor (Medical):   Marland Kitchen Lack of Transportation (Non-Medical):   Physical Activity:   . Days of Exercise per Week:   . Minutes of Exercise per Session:   Stress:   . Feeling of Stress :   Social Connections:   . Frequency of Communication with Friends and Family:   . Frequency of Social Gatherings with Friends and Family:   . Attends Religious Services:   . Active Member of Clubs or Organizations:   . Attends Archivist Meetings:   Marland Kitchen Marital Status:   Intimate Partner Violence:   . Fear of Current or Ex-Partner:   . Emotionally Abused:   Marland Kitchen Physically Abused:   . Sexually Abused:      FAMILY HISTORY: Family History  Problem Relation Age of Onset  . Colon cancer Neg Hx     ALLERGIES:  is allergic to nsaids.  MEDICATIONS:  Current Outpatient Medications  Medication Sig Dispense Refill  . acetaminophen (TYLENOL) 325 MG tablet Take 325-650 mg by mouth every 6 (six) hours as needed for mild pain.    . B Complex Vitamins (B COMPLEX PO) Take 1 tablet by mouth daily.    . calcium carbonate (TUMS - DOSED IN MG ELEMENTAL CALCIUM) 500 MG chewable tablet Chew 2 tablets by mouth daily as needed for indigestion or heartburn.    . cholecalciferol (VITAMIN D3) 25 MCG (1000 UT) tablet Take 1 tablet (1,000 Units total) by mouth daily. 30 tablet 3  . lidocaine-prilocaine (EMLA) cream Apply to affected area once (Patient taking differently: Apply 1 application topically as needed (For port-a-cath.). ) 30 g 3  . mesalamine (LIALDA) 1.2 g EC tablet Take 2.4 g by mouth daily.     . ondansetron (ZOFRAN) 8 MG tablet Take  1 tablet (8 mg total) by mouth 2 (two) times daily as needed for refractory nausea / vomiting. Start on day 3 after cyclophosphamide chemotherapy. 30 tablet 1  . polyethylene glycol (MIRALAX / GLYCOLAX) 17 g packet Take 17 g by mouth daily as needed for mild constipation.    . potassium chloride SA (K-DUR) 20 MEQ tablet Take 1 tablet (20 mEq total) by mouth daily. 30 tablet 1  . Prenatal Vit-Fe Fumarate-FA (MULTIVITAMIN-PRENATAL) 27-0.8 MG TABS tablet Take 1 tablet by mouth daily at 12 noon.    . traZODone (DESYREL) 50 MG tablet Take 1 tablet (50 mg total) by mouth at bedtime as needed for sleep. (Patient taking differently: Take 25 mg by mouth at bedtime as needed for sleep. ) 30 tablet 3   No current facility-administered medications for this visit.    REVIEW OF SYSTEMS:   A 10+ POINT REVIEW OF SYSTEMS WAS OBTAINED including neurology, dermatology, psychiatry, cardiac, respiratory, lymph, extremities, GI, GU, Musculoskeletal, constitutional, breasts, reproductive,  HEENT.  All pertinent positives are noted in the HPI.  All others are negative.   PHYSICAL EXAMINATION: ECOG PERFORMANCE STATUS: 2 - Symptomatic, <50% confined to bed  . Vitals:   10/28/19 1204  BP: (!) 153/93  Pulse: 64  Resp: 18  Temp: 97.7 F (36.5 C)  SpO2: 100%   Filed Weights   10/28/19 1204  Weight: 151 lb 8 oz (68.7 kg)   .Body mass index is 29.59 kg/m.   GENERAL:alert, in no acute distress and comfortable SKIN: no acute rashes, no significant lesions EYES: conjunctiva are pink and non-injected, sclera anicteric OROPHARYNX: MMM, no exudates, no oropharyngeal erythema or ulceration NECK: supple, no JVD LYMPH:  no palpable lymphadenopathy in the cervical, axillary or inguinal regions LUNGS: clear to auscultation b/l with normal respiratory effort HEART: regular rate & rhythm ABDOMEN:  normoactive bowel sounds , non tender, not distended. Extremity: no pedal edema PSYCH: alert & oriented x 3 with fluent speech NEURO: no focal motor/sensory deficits  LABORATORY DATA:  I have reviewed the data as listed  . CBC Latest Ref Rng & Units 10/28/2019 07/28/2019 05/30/2019  WBC 4.0 - 10.5 K/uL 4.4 4.4 4.4  Hemoglobin 12.0 - 15.0 g/dL 12.7 12.1 11.8(L)  Hematocrit 36 - 46 % 39.1 37.0 35.9(L)  Platelets 150 - 400 K/uL 214 217 215    . CMP Latest Ref Rng & Units 10/28/2019 07/28/2019 05/30/2019  Glucose 70 - 99 mg/dL 82 97 96  BUN 6 - 20 mg/dL 14 13 12   Creatinine 0.44 - 1.00 mg/dL 0.79 0.73 0.69  Sodium 135 - 145 mmol/L 142 141 140  Potassium 3.5 - 5.1 mmol/L 3.9 3.9 3.8  Chloride 98 - 111 mmol/L 107 106 104  CO2 22 - 32 mmol/L 25 27 25   Calcium 8.9 - 10.3 mg/dL 9.3 9.1 9.0  Total Protein 6.5 - 8.1 g/dL 6.7 6.3(L) 6.8  Total Bilirubin 0.3 - 1.2 mg/dL 0.4 0.2(L) 0.3  Alkaline Phos 38 - 126 U/L 140(H) 115 100  AST 15 - 41 U/L 25 26 26   ALT 0 - 44 U/L 28 31 35   . Lab Results  Component Value Date   LDH 205 (H) 10/28/2019      RADIOGRAPHIC STUDIES: I have  personally reviewed the radiological images as listed and agreed with the findings in the report. No results found.  ASSESSMENT & PLAN:   1. Stage IV DLBCL, GCB subtype;double hitlymphoma -S/p 1 cycle of R-CHOP and 5 cycles of da EPOCH-R  and IT MTX x 4 for CNS prophylaxis -FISH  on 01/03/2019 and were positive for Myc and BCL2 rearrangement  02/14/2019 PET Scan Whole Body ([id number]) revealed "1. Findings favor complete metabolic response. No residual hypermetabolism within the lymph nodes of the neck, chest, abdomen or pelvis, which have all decreased in size in the interval. 2. Nonspecific new diffuse skeletal hypermetabolism and new mild splenic hypermetabolism, favor reactive state of the marrow and reticuloendothelial system. Spleen is normal size. Continued surveillance with CT or PET-CT advised. 3. Small layering left pleural effusion, decreased. 4.  Aortic Atherosclerosis (ICD10-I70.0)."  -Completed 5 cycles of R-EPOCH on 04/04/2019, 6 total cycles of chemotherapy (1st cycle was R-CHOP) -Completed 4 cycles of IT METHOTREXATE on 04/25/2019  05/23/2019 PET/CT scan ([id number]) revealed "Small calcified jejunal mesentery nodes, compatible with treated lymphoma. Deauville category 2. No findings suspicious for active lymphoma. Prior splenic and osseous hypermetabolism is no longer evident."  PLAN:  -Discussed pt labwork today, 10/28/19;  of CBC w/diff and CMP is as follows: all values are WNL except for Alkaline Phosphatase at 140 -Discussed 10/28/19 of LDH at 205 -Pt is 6 months out from treatment  -Advised on itchiness- loose cotton clothes  -Advised on reflux - not eating immediately prior to going to bed and other anti-reflux measures -No clinical or lab evidence of lymphoma progression at this time. -Recommended continue staying active  -Will continue to watch every 3 months for the first 2 years  -Can take port out within next few visits  -Will see back in 3 months   2. 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 Normocytic anemia Resolved hgb 12.7 today.  FOLLOW UP: RTC with Dr Irene Limbo with labs in 3 months  The total time spent in the appt was 20 minutes and more than 50% was on counseling and direct patient cares.  All of the patient's questions were answered with apparent satisfaction. The patient knows to call the clinic with any problems, questions or concerns.  Sullivan Lone MD MS AAHIVMS [facility] Hematology/Oncology Physician [facility]  (Office):       [phone] (Work cell):  [phone] (Fax):           [phone]  10/28/2019 12:36 PM  I, Dawayne Cirri am acting as a scribe for Dr. Sullivan Lone.   .I have reviewed the above documentation for accuracy and completeness, and I agree with the above. [name] MD

## 2019-10-28 ENCOUNTER — Telehealth: Payer: Self-pay | Admitting: Hematology

## 2019-10-28 ENCOUNTER — Inpatient Hospital Stay: Payer: Self-pay | Attending: Hematology

## 2019-10-28 ENCOUNTER — Inpatient Hospital Stay: Payer: Self-pay

## 2019-10-28 ENCOUNTER — Other Ambulatory Visit: Payer: Self-pay

## 2019-10-28 ENCOUNTER — Other Ambulatory Visit: Payer: Self-pay | Admitting: *Deleted

## 2019-10-28 ENCOUNTER — Inpatient Hospital Stay (HOSPITAL_BASED_OUTPATIENT_CLINIC_OR_DEPARTMENT_OTHER): Payer: Self-pay | Admitting: Hematology

## 2019-10-28 VITALS — BP 153/93 | HR 64 | Temp 97.7°F | Resp 18 | Ht 60.0 in | Wt 151.5 lb

## 2019-10-28 DIAGNOSIS — C8330 Diffuse large B-cell lymphoma, unspecified site: Secondary | ICD-10-CM

## 2019-10-28 DIAGNOSIS — Z95828 Presence of other vascular implants and grafts: Secondary | ICD-10-CM

## 2019-10-28 DIAGNOSIS — C8339 Diffuse large B-cell lymphoma, extranodal and solid organ sites: Secondary | ICD-10-CM

## 2019-10-28 DIAGNOSIS — Z1231 Encounter for screening mammogram for malignant neoplasm of breast: Secondary | ICD-10-CM

## 2019-10-28 LAB — CBC WITH DIFFERENTIAL/PLATELET
Abs Immature Granulocytes: 0.01 10*3/uL (ref 0.00–0.07)
Basophils Absolute: 0 10*3/uL (ref 0.0–0.1)
Basophils Relative: 1 %
Eosinophils Absolute: 0.1 10*3/uL (ref 0.0–0.5)
Eosinophils Relative: 2 %
HCT: 39.1 % (ref 36.0–46.0)
Hemoglobin: 12.7 g/dL (ref 12.0–15.0)
Immature Granulocytes: 0 %
Lymphocytes Relative: 26 %
Lymphs Abs: 1.1 10*3/uL (ref 0.7–4.0)
MCH: 29.3 pg (ref 26.0–34.0)
MCHC: 32.5 g/dL (ref 30.0–36.0)
MCV: 90.3 fL (ref 80.0–100.0)
Monocytes Absolute: 0.5 10*3/uL (ref 0.1–1.0)
Monocytes Relative: 11 %
Neutro Abs: 2.7 10*3/uL (ref 1.7–7.7)
Neutrophils Relative %: 60 %
Platelets: 214 10*3/uL (ref 150–400)
RBC: 4.33 MIL/uL (ref 3.87–5.11)
RDW: 13.4 % (ref 11.5–15.5)
WBC: 4.4 10*3/uL (ref 4.0–10.5)
nRBC: 0 % (ref 0.0–0.2)

## 2019-10-28 LAB — CMP (CANCER CENTER ONLY)
ALT: 28 U/L (ref 0–44)
AST: 25 U/L (ref 15–41)
Albumin: 4.1 g/dL (ref 3.5–5.0)
Alkaline Phosphatase: 140 U/L — ABNORMAL HIGH (ref 38–126)
Anion gap: 10 (ref 5–15)
BUN: 14 mg/dL (ref 6–20)
CO2: 25 mmol/L (ref 22–32)
Calcium: 9.3 mg/dL (ref 8.9–10.3)
Chloride: 107 mmol/L (ref 98–111)
Creatinine: 0.79 mg/dL (ref 0.44–1.00)
GFR, Est AFR Am: >60 mL/min (ref >60)
GFR, Estimated: >60 mL/min (ref >60)
Glucose, Bld: 82 mg/dL (ref 70–99)
Potassium: 3.9 mmol/L (ref 3.5–5.1)
Sodium: 142 mmol/L (ref 135–145)
Total Bilirubin: 0.4 mg/dL (ref 0.3–1.2)
Total Protein: 6.7 g/dL (ref 6.5–8.1)

## 2019-10-28 LAB — LACTATE DEHYDROGENASE: LDH: 205 U/L — ABNORMAL HIGH (ref 98–192)

## 2019-10-28 MED ORDER — HEPARIN SOD (PORK) LOCK FLUSH 100 UNIT/ML IV SOLN
500.0000 [IU] | Freq: Once | INTRAVENOUS | Status: AC
  Administered 2019-10-28: 500 [IU]
  Filled 2019-10-28: qty 5

## 2019-10-28 MED ORDER — SODIUM CHLORIDE 0.9% FLUSH
10.0000 mL | Freq: Once | INTRAVENOUS | Status: AC
  Administered 2019-10-28: 10 mL
  Filled 2019-10-28: qty 10

## 2019-10-28 NOTE — Telephone Encounter (Signed)
Scheduled per 6/11 los. Printed avs and calendar for pt.

## 2019-11-08 ENCOUNTER — Other Ambulatory Visit: Payer: Self-pay | Admitting: Hematology

## 2019-11-10 ENCOUNTER — Ambulatory Visit: Payer: Self-pay | Admitting: *Deleted

## 2019-11-10 ENCOUNTER — Other Ambulatory Visit: Payer: Self-pay

## 2019-11-10 VITALS — BP 136/74 | Temp 97.8°F | Wt 151.3 lb

## 2019-11-10 DIAGNOSIS — Z124 Encounter for screening for malignant neoplasm of cervix: Secondary | ICD-10-CM

## 2019-11-10 DIAGNOSIS — Z01419 Encounter for gynecological examination (general) (routine) without abnormal findings: Secondary | ICD-10-CM

## 2019-11-10 NOTE — Patient Instructions (Addendum)
Informed [place] about breast self awareness. Patient received a Pap smear today since she was unsure of when her last Pap smear was or the results of it. Let her know [facility] will cover Pap smears and HPV typing every 5 years unless has a history of abnormal Pap smears. Referred patient to the [place] for diagnostic mammogram. Appointment scheduled for November 11, 2019 at 1:30pm. Patient aware of appointment and will be there. Let patient know will follow up with her within the next couple weeks with results of her Pap smear by letter or phone. [place] verbalized understanding.  Vania Rea, RN, FNP student 3:47 PM

## 2019-11-10 NOTE — Progress Notes (Signed)
Denise Walls is a 59 y.o. G3P0 female who presents to [facility] clinic today with complaints of right nipple/areola itching and discoloration.    Pap Smear: Pap smear completed today. Patient is unsure of when her last Pap smear was or the results of it but she states that it was done in [place]. Per patient, she has no history of an abnormal Pap smear. Last Pap smear result is not available in Epic.   Physical exam: Breasts Breasts symmetrical. Skin under the right nipple area has discoloration and is itchy per patient. No nipple retraction bilateral breasts. No nipple discharge bilateral breasts. No lymphadenopathy. No lumps palpated bilateral breasts. Patient reports history of a chronic lower left breast lump several years ago that turned out to be benign on mammogram and ultrasound from 2019. No lump was palpated today. Port-a-cath is still in place in right upper breast area in the 12 o'clock position. No complaints of pain or tenderness on exam.      Pelvic/Bimanual Ext Genitalia No lesions, no swelling and no discharge observed on external genitalia.        Vagina Vagina pink and normal texture. No lesions or discharge observed in vagina.        Cervix Cervix is present. Cervix pink and of normal texture. No discharge observed.    Uterus Uterus is present and palpable. Uterus in positioned slightly upward and to the patient's left and is of normal size.      Adnexae Bilateral ovaries present and palpable. No tenderness on palpation.         Rectovaginal No rectal exam completed today since patient had no rectal complaints. No skin abnormalities observed on exam.     Smoking History: Patient has never smoked.    Patient Navigation: Patient education provided. Access to services provided for patient through [place] program. Rudene Anda, [place] interpreter provided through [facility].   Colorectal Cancer Screening: Per patient has had colonoscopy in 2019. No complaints  today.    Breast and Cervical Cancer Risk Assessment: Patient does not have family history of breast cancer, known genetic mutations, or radiation treatment to the chest before age 42. Patient does not have history of cervical dysplasia, immunocompromised, or DES exposure in-utero.  Risk Assessment    Risk Scores      11/10/2019   Last edited by: Royston Bake, CMA   5-year risk: 1.1 %   Lifetime risk: 6 %          A: [facility] exam with pap smear Complaints of right nipple/areola itching and discoloration.  P: Referred patient to the [place] for a diagnostic mammogram. Appointment scheduled November 11, 2019 at 1:30pm.  Vania Rea, RN, FNP student 11/10/2019 3:33 PM    Attestation of Supervision of Student:  I confirm that I have verified the information documented in the nurse practitioner student's note and that I have also personally reperformed the history, physical exam and all medical decision making activities.  I have verified that all services and findings are accurately documented in this student's note; and I agree with management and plan as outlined in the documentation. I have also made any necessary editorial changes.  [place], [place] for [employer], [employer] 11/10/2019 4:17 PM

## 2019-11-11 ENCOUNTER — Ambulatory Visit
Admission: RE | Admit: 2019-11-11 | Discharge: 2019-11-11 | Disposition: A | Discharge status: Home / Self Care | Payer: No Typology Code available for payment source | Source: Ambulatory Visit | Attending: Obstetrics and Gynecology | Admitting: Obstetrics and Gynecology

## 2019-11-11 DIAGNOSIS — Z1231 Encounter for screening mammogram for malignant neoplasm of breast: Secondary | ICD-10-CM

## 2019-11-11 LAB — CYTOLOGY - PAP
Comment: NEGATIVE
Diagnosis: NEGATIVE
High risk HPV: NEGATIVE

## 2019-11-16 ENCOUNTER — Telehealth: Payer: Self-pay

## 2019-11-16 NOTE — Telephone Encounter (Signed)
(  11/14/2019) Via [place] Free Spanish Interpreter, Patient informed Negative Pap/HPV Results, repeat in 5 years.  Patient verbalized understanding.

## 2019-12-06 ENCOUNTER — Other Ambulatory Visit: Payer: Self-pay

## 2019-12-06 DIAGNOSIS — Z0000 Encounter for general adult medical examination without abnormal findings: Secondary | ICD-10-CM

## 2019-12-07 ENCOUNTER — Inpatient Hospital Stay: Payer: Self-pay | Attending: Obstetrics and Gynecology | Admitting: *Deleted

## 2019-12-07 ENCOUNTER — Other Ambulatory Visit: Payer: Self-pay

## 2019-12-07 VITALS — BP 148/90 | Ht <= 58 in | Wt 154.4 lb

## 2019-12-07 DIAGNOSIS — Z0000 Encounter for general adult medical examination without abnormal findings: Secondary | ICD-10-CM

## 2019-12-07 NOTE — Progress Notes (Signed)
[facility] initial screening   Interpreter- Rudene Anda, [place]   Clinical Measurement:  Vitals:   12/07/19 0851 12/07/19 0922  BP: (!) 150/92 (!) 148/90   Fasting Labs Drawn Today, will review with patient when they result.   Medical History:  Patient states that she has high cholesterol, has high blood pressure and she does not have diabetes.  Medications:  Patient states that she does not take medication to lower cholesterol, blood pressure and blood sugar.  Patient does not take an aspirin a day to help prevent a heart attack or stroke. Patient does not take medication to lower cholesterol, blood pressure or blood sugar.   Blood pressure, self measurement:  :  Patient states that she does not measure blood pressure from home. She checks her blood pressure N/A. She shares her readings with a health care provider: N/A.   Nutrition: Patient states that on average she eats 0 cups of fruit and 0 cups of vegetables per day. Patient states that she does not eat fish at least 2 times per week. Patient eats less than half servings of whole grains. Patient drinks less than 36 ounces of beverages with added sugar weekly: yes. Patient is currently watching sodium or salt intake: yes. In the past 7 days patient has had 0 drinks containing alcohol. On average patient drinks 0 drinks containing alcohol per day.      Physical activity:  Patient states that she gets 300 minutes of moderate and 0 minutes of vigorous physical activity each week.  Smoking status:  Patient states that she has has never smoked .   Quality of life:  Over the past 2 weeks patient states that she had little interest or pleasure in doing things: not at all. She has been feeling down, depressed or hopeless:not at all.    Risk reduction and counseling:   Health Coaching: Spoke in great detail about patient's current diet and goals for a healthier diet in the future. Patient stated that she wants to loose weight. Gave patient  some recommendations that she can start to work on now. Will give further recommendations once lab results are back. Today we spoke about adding more fruits and vegetables in daily diet. Patient stated that some days she will eat a fruit or vegetable but not every day. Encouraged her to try and start adding fruits and vegetables in daily diet with a goal of 2 fruits and 3 vegetables a day. Spoke with patient about adding heart healthy fish into weekly diet. Gave recommendations of salmon, tuna, mackerel or sardines (x2 weekly if possible). Also spoke with patient about the importance of whole grains in daily diet. Gave recommendations of (whole wheat bread, whole wheat pasta, brown rice and oatmeal). Encouraged patient to continue with her exercise routine. She stated that she currently walks 60 minutes x5 days a week.   Navigation:  I will notify patient of lab results.  Patient is aware of 2 more health coaching sessions and a follow up. Will refer patient to Internal Medicine for follow-up for elevated BP.   Time: 45 minutes

## 2019-12-08 LAB — LIPID PANEL
Chol/HDL Ratio: 3.5 ratio (ref 0.0–4.4)
Cholesterol, Total: 227 mg/dL — ABNORMAL HIGH (ref 100–199)
HDL: 65 mg/dL (ref >39)
LDL Chol Calc (NIH): 146 mg/dL — ABNORMAL HIGH (ref 0–99)
Triglycerides: 91 mg/dL (ref 0–149)
VLDL Cholesterol Cal: 16 mg/dL (ref 5–40)

## 2019-12-08 LAB — HEMOGLOBIN A1C
Est. average glucose Bld gHb Est-mCnc: 120 mg/dL
Hgb A1c MFr Bld: 5.8 % — ABNORMAL HIGH (ref 4.8–5.6)

## 2019-12-08 LAB — GLUCOSE, RANDOM: Glucose: 88 mg/dL (ref 65–99)

## 2019-12-12 ENCOUNTER — Telehealth: Payer: Self-pay

## 2019-12-12 NOTE — Telephone Encounter (Signed)
Health coaching 2   interpreter- Rudene Anda, [place] cholesterol, 146 LDL cholesterol, 91 triglycerides, 65 HDL cholesterol, 5.8 hemoglobin A1C, 120 mean plasma glucose   Patient understands and is aware of her lab results.   Goals-  Discussed lab results with patient in detail and answered any questions patient had regarding results. Encouraged patient to start watching the amounts of fried and fatty foods consumed. Patient stated that she also eats a lot of red meat. Explained to patient that too much red meat can elevate cholesterol levels. Also encouraged patient to try and add more heart healthy fish and whole grains. Encouraged patient to also start decreasing the amounts of sweets and sugar consumed as well as carbohydrates. Patient stated that she does consume a lot of carbohydrates. Spoke in detail about the daily recommendation for grains and what a serving looks like for this group. Patient stated that she feels like her serving sizes are too big. Encouraged her to start measuring her servings out using measuring cups.   Navigation:  Patient is aware of 1 more health coaching sessions and a follow up. Will call patient with follow-up appointment information for Internal Medicine for elevated labs and BP once appointment is scheduled.  Time- 21 minutes

## 2019-12-14 IMAGING — MR MR HEAD WO/W CM
9 of 15 series · 33 of 48 positions shown · IV contrast (gadavist)
Comparison: None.

CLINICAL DATA: Vertigo episodic. History of lymphoma. Malignant
pleural effusion. Crohn's disease.

EXAM:
MRI HEAD WITHOUT AND WITH CONTRAST
TECHNIQUE: Multiplanar, multiecho pulse sequences of the brain and surrounding
structures were obtained without and with intravenous contrast.
CONTRAST:  6mL GADAVIST GADOBUTROL 1 MMOL/ML IV SOLN

[Series 3: DWI · axial · 3.0mm · 1.09mm/px · z∈[-16,+137]mm · 7 of 104 slices shown (1 of 4)]
[im 1/104]
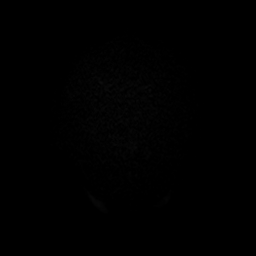
[im 18/104]
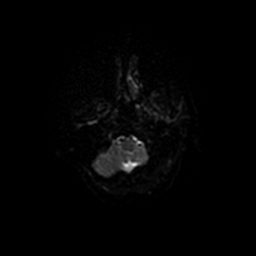
[im 35/104]
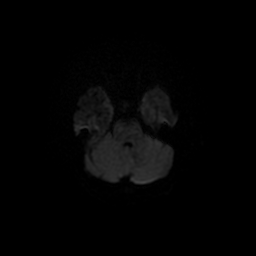
[im 52/104]
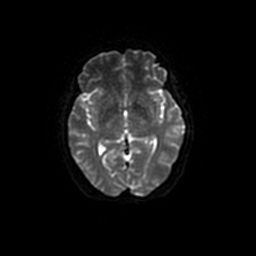
[im 69/104]
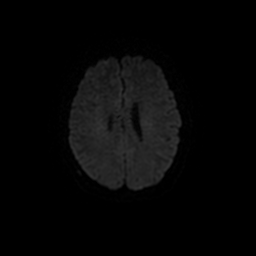
[im 86/104]
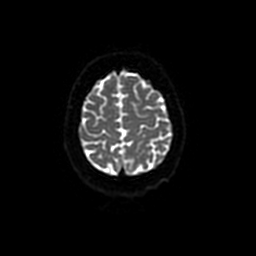
[im 104/104]
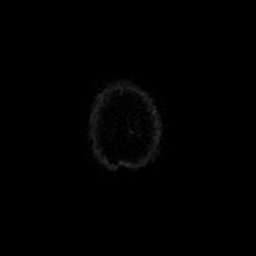

[Series 3: T1 post-contrast · axial · 2.3mm · 0.47mm/px · z∈[-85,+50]mm · 4 of 60 slices shown (1 of 4)]
[im 1/60]
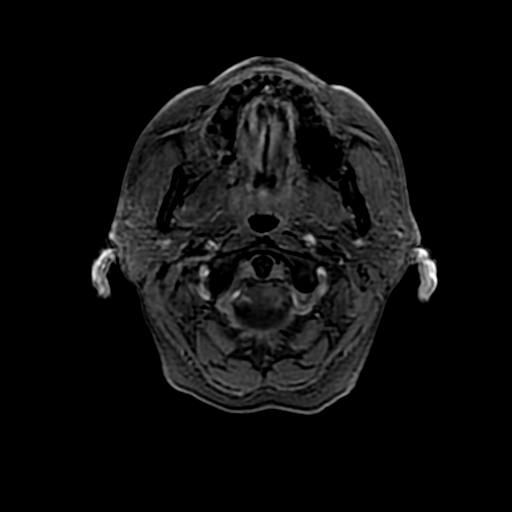
[im 20/60]
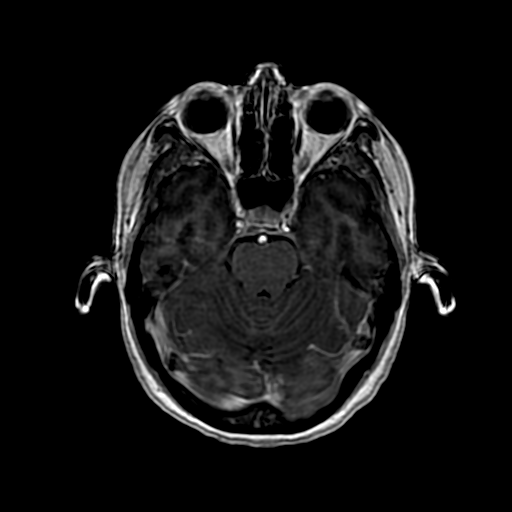
[im 40/60]
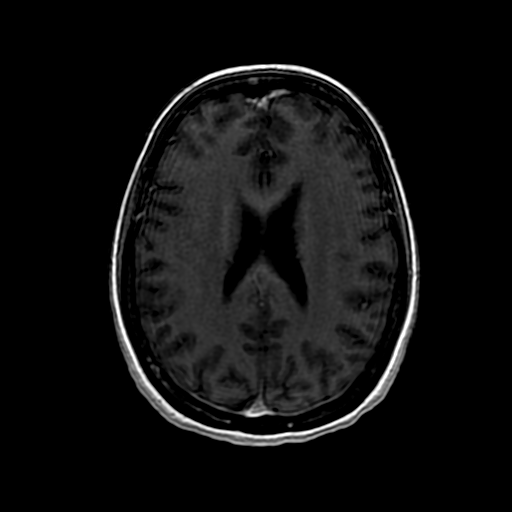
[im 60/60]
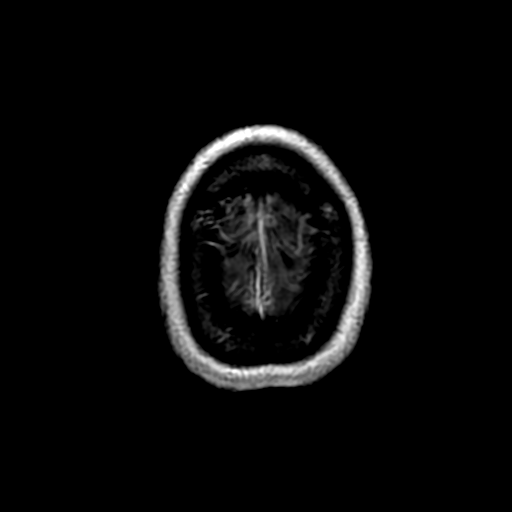

[Series 4: T1 post-contrast · coronal · 5.0mm · 0.43mm/px · 1 of 25 slices shown (2 of 4)]
[im 1/25]
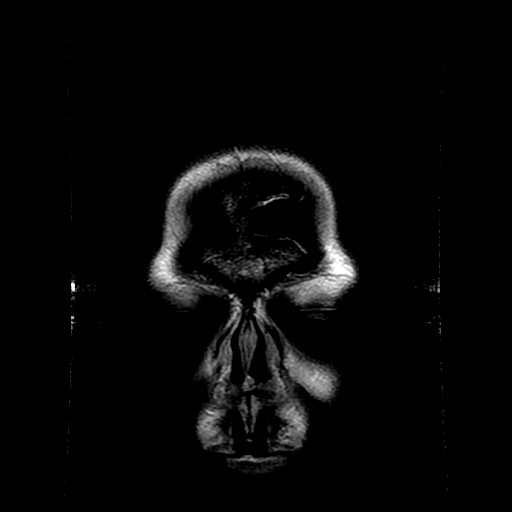

[Series 5: T1 post-contrast · sagittal · 5.0mm · 0.47mm/px · 1 of 19 slices shown (3 of 4)]
[im 1/19]
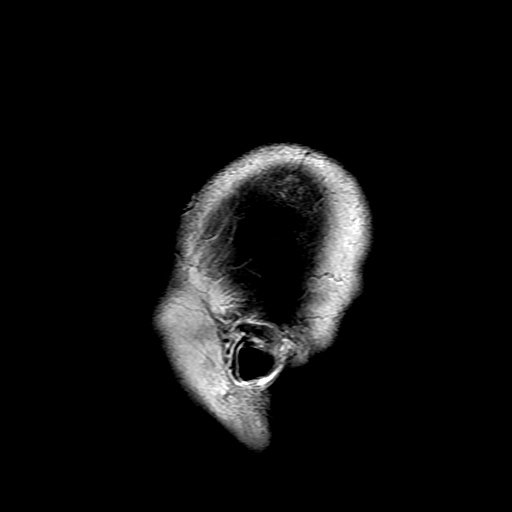

[Series 6: FLAIR · axial · 3.0mm · 0.43mm/px · z∈[-7,+130]mm · 2 of 24 slices shown]
[im 1/24]
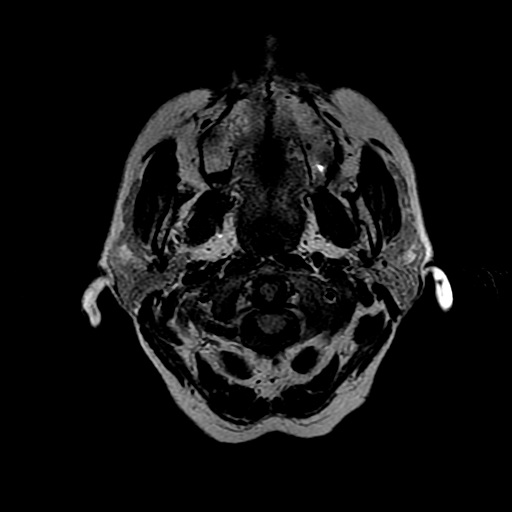
[im 24/24]
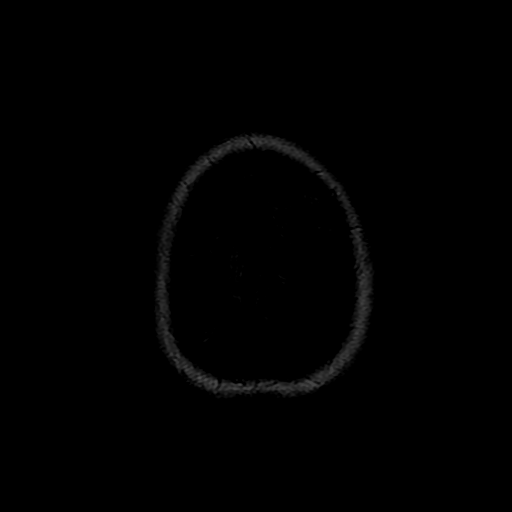

[Series 9: DWI · coronal · 4.0mm · 1.09mm/px · 6 of 76 slices shown (2 of 4)]
[im 1/76]
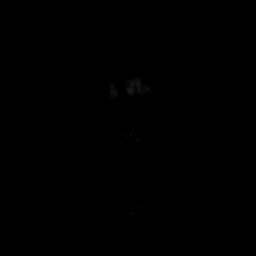
[im 16/76]
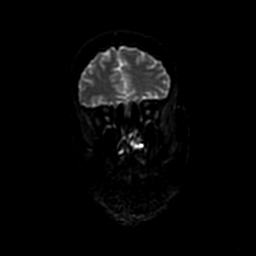
[im 31/76]
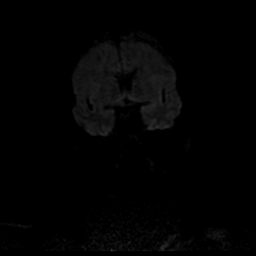
[im 46/76]
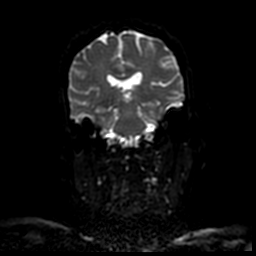
[im 61/76]
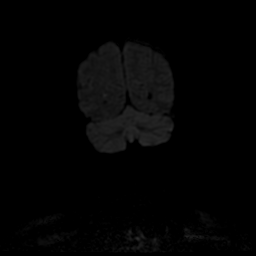
[im 76/76]
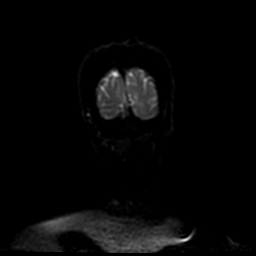

[Series 11: T1 post-contrast · axial · 2.3mm · 0.47mm/px · z∈[-9,+126]mm · 5 of 60 slices shown (4 of 4)]
[im 1/60]
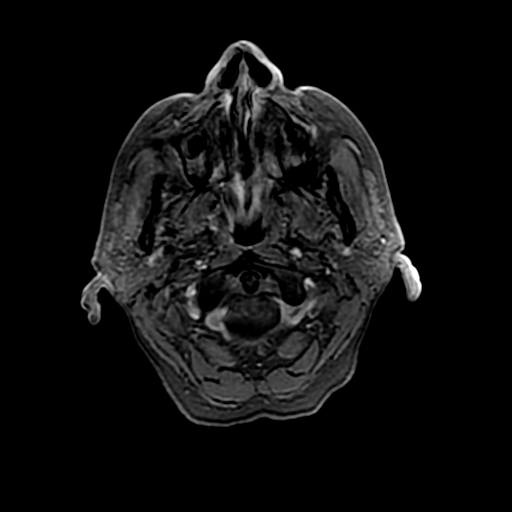
[im 15/60]
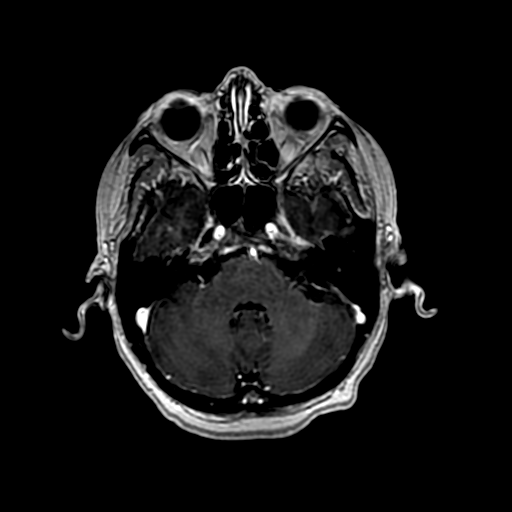
[im 30/60]
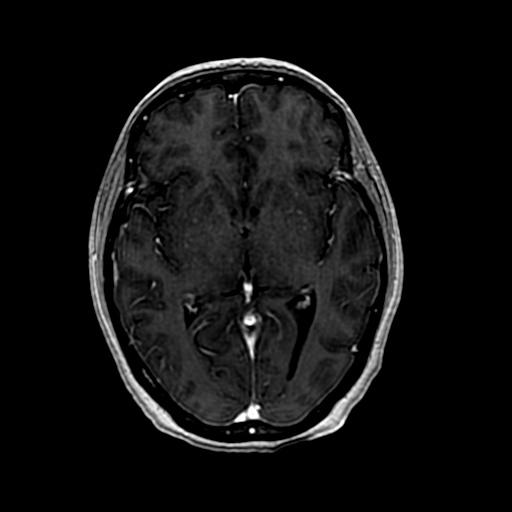
[im 45/60]
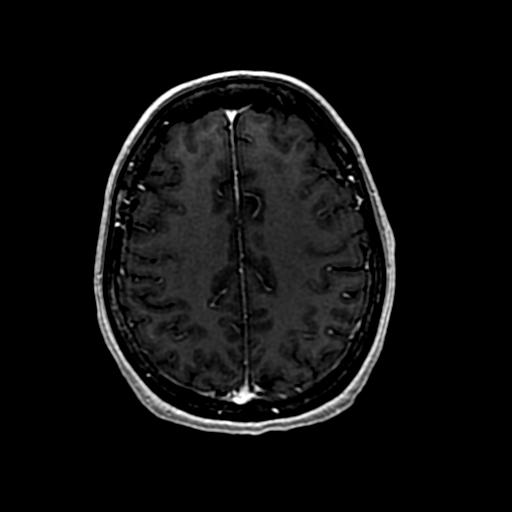
[im 60/60]
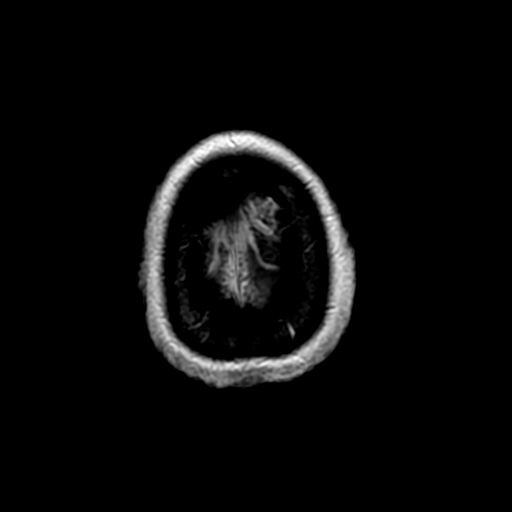

[Series 300: DWI · axial · 3.0mm · 1.09mm/px · z∈[-16,+137]mm · 4 of 52 slices shown (3 of 4)]
[im 1/52]
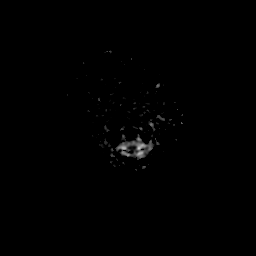
[im 18/52]
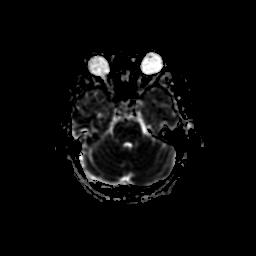
[im 35/52]
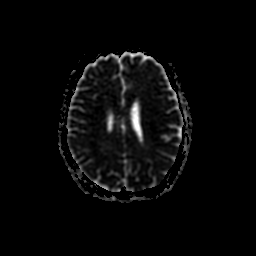
[im 52/52]
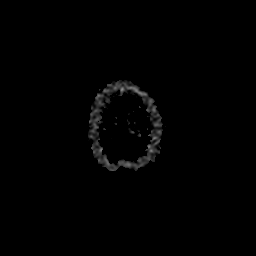

[Series 900: DWI · coronal · 4.0mm · 1.09mm/px · 3 of 38 slices shown (4 of 4)]
[im 1/38]
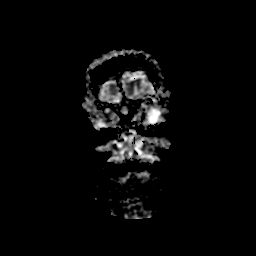
[im 19/38]
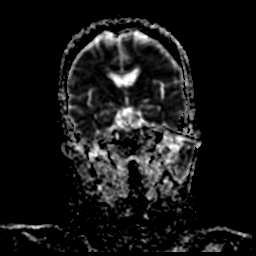
[im 38/38]
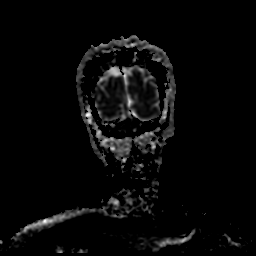

[33 of 48 positions shown; findings below may reference images not displayed]

FINDINGS: Brain: On the initial scan, the tech inadvertently did not perform
coronal postcontrast imaging. The patient returned for postcontrast
sagittal coronal and axial imaging as there was question of
enhancing lesion in the left anterior putamen on the initial study.
This area is not seen on repeat imaging and is felt to be artifact
on the initial study. No enhancing mass lesion identified.

Ventricle size normal. Cerebral volume normal. Negative for acute
infarct. Mild periventricular white matter hyperintensity
bilaterally appears chronic. No hemorrhage mass or edema.

Vascular: Normal arterial flow voids.

Skull and upper cervical spine: Negative

Sinuses/Orbits: Mucosal edema paranasal sinuses.  Normal orbit

Other: None
IMPRESSION: No acute abnormality.  No acute infarct or mass

Repeat imaging fails to confirm a small enhancing lesion left
anterior putamen which is felt to be an artifact on the initial
axial sequence postcontrast.

## 2019-12-19 ENCOUNTER — Telehealth: Payer: Self-pay

## 2019-12-19 NOTE — Telephone Encounter (Signed)
Called patient via interpreter Rudene Anda, [employer] to give patient follow-up appointment information. Patient is scheduled with Internal Medicine on Friday, August 13th @ 3:30 pm. Patient received bill for lab work from [employer], informed patient that we will need to get a copy of the bill in order to determine what Cristela Blue Woman covers.

## 2019-12-21 ENCOUNTER — Telehealth: Payer: Self-pay

## 2019-12-21 NOTE — Telephone Encounter (Signed)
Spoke with patient's daughter Georgiana Spinner about the [employer] bill that [place] received. Asked patient's daughter if she could help her send me a picture of the bill she received to my e-mail so that we can get the charges removed from her account. Daughter voiced understanding and said she would help her today with that.

## 2019-12-30 ENCOUNTER — Other Ambulatory Visit: Payer: Self-pay | Admitting: Student

## 2019-12-30 ENCOUNTER — Ambulatory Visit (INDEPENDENT_AMBULATORY_CARE_PROVIDER_SITE_OTHER): Payer: Self-pay | Admitting: Student

## 2019-12-30 ENCOUNTER — Encounter: Payer: Self-pay | Admitting: Student

## 2019-12-30 ENCOUNTER — Other Ambulatory Visit: Payer: Self-pay

## 2019-12-30 DIAGNOSIS — E785 Hyperlipidemia, unspecified: Secondary | ICD-10-CM

## 2019-12-30 DIAGNOSIS — R7303 Prediabetes: Secondary | ICD-10-CM

## 2019-12-30 DIAGNOSIS — I10 Essential (primary) hypertension: Secondary | ICD-10-CM

## 2019-12-30 MED ORDER — ATORVASTATIN CALCIUM 20 MG PO TABS
20.0000 mg | ORAL_TABLET | Freq: Every day | ORAL | 11 refills | Status: DC
Start: 2019-12-30 — End: 2019-12-30

## 2019-12-30 MED ORDER — AMLODIPINE BESYLATE 5 MG PO TABS
5.0000 mg | ORAL_TABLET | Freq: Every day | ORAL | 11 refills | Status: DC
Start: 2019-12-30 — End: 2019-12-30

## 2019-12-30 MED FILL — AMLODIPINE BESYLATE 5 MG TA: 5 | 30 days supply | Qty: 30 | Fill #0

## 2019-12-30 MED FILL — ATORVASTATIN 20 MG TABLET: 20 | 30 days supply | Qty: 30 | Fill #0

## 2019-12-30 NOTE — Patient Instructions (Addendum)
¡  Gracias por permitirnos ayudarlo con su atencin hoy!  Hoy discutimos:  1) presin arterial - Tu presin arterial era 153/78 hoy. Lo volveremos a tomar con amlodipina y volveremos a controlar su presin arterial en un mes. - Si puede controlar su presin arterial en [place], regstrelos y traiga estos registros durante su prxima visita.  2) colesterol alto - Su colesterol estaba alto en sus anlisis de laboratorio recientes. Comenzaremos con atorvastatina 20 mg. Esto es similar al [employer] que ha estado tomando en el pasado.  3) prediabetes Su A1C (prueba que indica diabetes) fue de 5,8. Que es Counselling psychologist. Contine trabajando en su dieta y contine perdiendo peso y nos comunicaremos con usted en un mes.  Si tiene alguna pregunta o inquietud, llame a [place] oficina y programe una cita antes.  Regrese a Copy en un mes para volver a Chief Technology Officer su presin arterial, colesterol y prediabetes.  Thank you for allowing [place] to assist in your care today!   Today we discussed:  1) Blood pressure - Your blood pressure was 153/78 today. We will be restarting you on your amlodipine and rechecking your blood pressure in one month.  - If you are able check your blood pressures at home twice a day, record them, and bring in these recordings during your next visit.   2) High Cholesterol - Your cholesterol was high on your recent labs. We will be starting you on atorvastatin 20 mg. This is similar to the medicine you have been taking in the past.   3) Prediabetes Your A1C (test that indicates diabetes) was 5.8. Which is high. Continue to work on your diet and continue losing weight and we will follow up with you in a month.   If you have any questions or concerns please call our office and schedule an appointment sooner.

## 2019-12-30 NOTE — Progress Notes (Signed)
   CC: [facility] Woman W/ Elevated LDL and Hemoglobin A1C  HPI:   [name] is a 59 y.o. F with a past medical history as listed below, who presents to the clinic today with abnormal labs from [facility]. Patient was found to have an A1C of 5.8 and LDL cholesterol of 146. Ms. 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 She states she is walking 4-5 times a day and has lost 4-5 lbs from exercising and dieting.   Ms. Denise Walls states during treatment for her lymphoma she thought she was not supposed to take her regular medications. Only recently did she come to realize that this was not the case and that she was supposed to continue taking her medications. She is currently working to get back onto her prior insurance coverage.  She recently lost her insurance and has been unable to restart her medications. She notes being on two blood pressure medications, however, she recalls being on one medication that was 25 mg and caused her have a lower sodium while she was going through chemotherapy, this medicine was then discontinued.   She follows up with her oncologist in the following weeks.  She has no complaints at this time and is doing well otherwise.  Past Medical History:  Diagnosis Date  . Acute cystitis   . Anxiety   . Arthritis   . Crohn disease ([place])    dx 2004, history of small bowel obstruction 02/2007 treated conservatively  . History of colon polyps   . History of shingles   . HTN (hypertension)   . Hypercholesterolemia   . Insomnia   . Migraine   . Uterine fibroid    Review of Systems:  As Per HPI  Physical Exam:  Vitals:   12/30/19 1529  BP: (!) 153/78  Pulse: 66  Temp: 98 F (36.7 C)  TempSrc: Oral  SpO2: 99%  Weight: 156 lb 4.8 oz (70.9 kg)  Height: 5' (1.524 m)   Physical Exam Vitals and nursing note reviewed.  Constitutional:      General: She is not in acute distress.    Appearance:  Normal appearance. She is not ill-appearing, toxic-appearing or diaphoretic.  HENT:     Head: Atraumatic.  Eyes:     Extraocular Movements: Extraocular movements intact.  Cardiovascular:     Rate and Rhythm: Normal rate and regular rhythm.     Pulses: Normal pulses.     Heart sounds: No murmur heard.  No gallop.   Pulmonary:     Effort: Pulmonary effort is normal. No respiratory distress.     Breath sounds: Normal breath sounds. No stridor. No wheezing, rhonchi or rales.  Musculoskeletal:        General: Normal range of motion.     Cervical back: Normal range of motion.  Skin:    General: Skin is warm and dry.  Neurological:     General: No focal deficit present.     Mental Status: She is alert and oriented to person, place, and time.  Psychiatric:        Mood and Affect: Mood normal.        Behavior: Behavior normal.    Assessment & Plan:   See Encounters Tab for problem based charting.  Patient discussed with and seen by Dr. Heber [place]

## 2019-12-31 DIAGNOSIS — E785 Hyperlipidemia, unspecified: Secondary | ICD-10-CM | POA: Insufficient documentation

## 2019-12-31 DIAGNOSIS — R7303 Prediabetes: Secondary | ICD-10-CM | POA: Insufficient documentation

## 2019-12-31 NOTE — Assessment & Plan Note (Signed)
Patient with history of hyperlipidemia and was on lovatstatin prior. Will switch to atorvastatin 20 mg. LDL of 146. Will recheck at next visit.  A/P - Start atorvastatin 20 mg daily.  - Will recheck labs at next visit.

## 2019-12-31 NOTE — Assessment & Plan Note (Signed)
A1C of 5.8. Patient denies history of diabetes, prediabetes, or being on medication prior. Discussed low carb diet with patient, she notes she has been exercising more and eating less tortillas that she makes.   A/P - A1C of 5.8. Prediabetes category. Will recheck A1C in 3 months.  - Will add metformin if A1C rises and/or patient unable to control with conservative management.

## 2019-12-31 NOTE — Assessment & Plan Note (Addendum)
Patient has a PMHx of essential hypertension and has been off of her medications for the past few months. BP of 153/78 today. Will restart her amlodipine and recheck in one month  A/P - BP of 153/78 today - Restart amlodipine 5 mg tab daily and hold HCTZ for now. Patient notes being taken off of HCTZ as she had hypokalemia and hyponatremia.  - Will follow up in one month for a recheck

## 2020-01-03 NOTE — Progress Notes (Signed)
Internal Medicine Clinic Attending  I saw and evaluated the patient.  I personally confirmed the key portions of the history and exam documented by Dr. Johnney Ou and I reviewed pertinent patient test results.  The assessment, diagnosis, and plan were formulated together and I agree with the documentation in the resident's note. As noted she previously had a admission for hyponatremia and was instructed to discontinue HCTZ, that presentation was complicated by chemotherapy induced nausea vomiting and dehydration so it is not completely clear that HCTZ was the culprit.  For now will restart amlodipine and follow up, in the future I would not write off thiazides completely but would get pre- and post initiation labs to monitor electrolytes.

## 2020-01-27 ENCOUNTER — Inpatient Hospital Stay: Payer: Self-pay | Attending: Hematology | Admitting: Hematology

## 2020-01-27 ENCOUNTER — Inpatient Hospital Stay: Payer: Self-pay

## 2020-01-27 ENCOUNTER — Other Ambulatory Visit: Payer: Self-pay

## 2020-01-27 VITALS — BP 137/91 | HR 79 | Temp 97.3°F | Resp 18 | Ht 60.0 in | Wt 154.9 lb

## 2020-01-27 DIAGNOSIS — C8330 Diffuse large B-cell lymphoma, unspecified site: Secondary | ICD-10-CM | POA: Insufficient documentation

## 2020-01-27 DIAGNOSIS — Z23 Encounter for immunization: Secondary | ICD-10-CM | POA: Insufficient documentation

## 2020-01-27 DIAGNOSIS — Z95828 Presence of other vascular implants and grafts: Secondary | ICD-10-CM

## 2020-01-27 LAB — CMP (CANCER CENTER ONLY)
ALT: 30 U/L (ref 0–44)
AST: 23 U/L (ref 15–41)
Albumin: 4 g/dL (ref 3.5–5.0)
Alkaline Phosphatase: 144 U/L — ABNORMAL HIGH (ref 38–126)
Anion gap: 8 (ref 5–15)
BUN: 16 mg/dL (ref 6–20)
CO2: 26 mmol/L (ref 22–32)
Calcium: 9.2 mg/dL (ref 8.9–10.3)
Chloride: 107 mmol/L (ref 98–111)
Creatinine: 0.8 mg/dL (ref 0.44–1.00)
GFR, Est AFR Am: >60 mL/min (ref >60)
GFR, Estimated: >60 mL/min (ref >60)
Glucose, Bld: 93 mg/dL (ref 70–99)
Potassium: 3.8 mmol/L (ref 3.5–5.1)
Sodium: 141 mmol/L (ref 135–145)
Total Bilirubin: 0.4 mg/dL (ref 0.3–1.2)
Total Protein: 7 g/dL (ref 6.5–8.1)

## 2020-01-27 LAB — CBC WITH DIFFERENTIAL/PLATELET
Abs Immature Granulocytes: 0.01 10*3/uL (ref 0.00–0.07)
Basophils Absolute: 0 10*3/uL (ref 0.0–0.1)
Basophils Relative: 1 %
Eosinophils Absolute: 0.1 10*3/uL (ref 0.0–0.5)
Eosinophils Relative: 2 %
HCT: 39.2 % (ref 36.0–46.0)
Hemoglobin: 12.9 g/dL (ref 12.0–15.0)
Immature Granulocytes: 0 %
Lymphocytes Relative: 27 %
Lymphs Abs: 1.3 10*3/uL (ref 0.7–4.0)
MCH: 29.5 pg (ref 26.0–34.0)
MCHC: 32.9 g/dL (ref 30.0–36.0)
MCV: 89.5 fL (ref 80.0–100.0)
Monocytes Absolute: 0.3 10*3/uL (ref 0.1–1.0)
Monocytes Relative: 7 %
Neutro Abs: 3 10*3/uL (ref 1.7–7.7)
Neutrophils Relative %: 63 %
Platelets: 224 10*3/uL (ref 150–400)
RBC: 4.38 MIL/uL (ref 3.87–5.11)
RDW: 13.2 % (ref 11.5–15.5)
WBC: 4.8 10*3/uL (ref 4.0–10.5)
nRBC: 0 % (ref 0.0–0.2)

## 2020-01-27 LAB — LACTATE DEHYDROGENASE: LDH: 241 U/L — ABNORMAL HIGH (ref 98–192)

## 2020-01-27 NOTE — Progress Notes (Signed)
HEMATOLOGY/ONCOLOGY CLINIC NOTE  Date of Service: 01/27/2020  Patient Care Team: Riesa Pope, MD as PCP - General (Internal Medicine)  CHIEF COMPLAINTS/PURPOSE OF CONSULTATION:  DLBCL (diffuse large B cell lymphoma)   HISTORY OF PRESENTING ILLNESS:  Denise Walls is a wonderful 59 y.o. female with a past medical history significant for anxiety, Crohn's disease, hypertension, hyperlipidemia, migraine headaches.  The patient's history is obtained through a Spanish interpreter via video, [employer].  The patient reports that she has history of Crohn's disease, for which she has been taking mesalamine with very good control of her symptoms. She was previously followed by Dr. Kathy Breach gastroenterology and to transition her care to a different gastroenterologist after Dr.Guptamoved to [facility]. Her new gastroenterologist stopped her mesalamine some time in 2019,and soon after that, she began to develop new onset left lower quadrant discomfort/pain,nonradiating, dull, intermittent, mild to moderate in intensity, exacerbated by eating and improved with defecation. She underwent colonoscopy, which was unremarkable, and was recommended to modify her diet without any improvement. She then re-established her care with Dr. Etter Sjogren ordered CT abdomen/pelvis that showed diffuse abdominal lymphadenopathy. PET showed diffuse lymph node involvement in the neck, chest, abdomen and the pelvis,and patient was referred to oncology for further evaluation.  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 The patient initially received 1 cycle of R-CHOP which she tolerated fairly well with exception of hyponatremia and neutropenia.  Prior to her second planned cycle of chemotherapy, she was found to be positive for Myc and BCL-2 rearrangement.  She is now receiving  R-EPOCH and is here for cycle #2 of his  chemotherapy.  Today, the patient reports that she is feeling well overall.  She reports a very good appetite and has been eating a lot at home.  She reports that her breathing is stable and maybe even slightly improved.  Had an episode of dizziness and vomiting and was seen in the ER last week. Symptoms have now resolved. Today, she denies any dizziness, headaches, chest discomfort, cough.  Denies abdominal pain, nausea, vomiting, constipation, diarrhea.  Denies bleeding.  The patient is here for admission for cycle #2 of R-EPOCH.  INTERVAL HISTORY:  Denise Walls is a wonderful 59 y.o. female who is here for evaluation and management of DLBCL. We are joined today by her translator. The patient's last visit with [place] was on 10/28/2019. The pt reports that she is doing well overall.  The pt reports that she is feeling better day to day. She is still experiencing stiffness in her fingers, specifically her thumb, that improves throughout the day. Pt notes that these symptoms were present prior to her diagnosis and treatment for DLBCL. She denies any current rash, but experienced some skin itching and bumps after being out in the sun. Pt is having some dental pain.  Pt has experienced periods of Depression surrounding isolation from the pandemic, being far from her family, and missing her mother, who passed. Pt feels that her mood is improving and continues to stay involved with her local church.   Lab results today (01/27/20) of CBC w/diff and CMP is as follows: all values are WNL except for ALP at 144. 01/27/2020 LDH at 241  On review of systems, pt reports joint stiffness, depressed moods, dental pain and denies SOB, rash, fevers, chills, night sweats, unexpected weight loss, abdominal pain/cramping, diarrhea and any other symptoms.   MEDICAL HISTORY:  Past  Medical History:  Diagnosis Date   Acute cystitis    Anxiety    Arthritis    Crohn disease ([place])    dx 2004, history of small  bowel obstruction 02/2007 treated conservatively   History of colon polyps    History of shingles    HTN (hypertension)    Hypercholesterolemia    Insomnia    Migraine    Uterine fibroid     SURGICAL HISTORY: Past Surgical History:  Procedure Laterality Date   COLONOSCOPY  09/11/2014   Small internal hemorrhoids. Otherwise normal colonoscopy to terminal ileum   COLONOSCOPY  10/02/2017   ESOPHAGOGASTRODUODENOSCOPY  04/05/2007   Mild gastritis. Otherwise, normal esophagogastroduodenoscopy   IR IMAGING GUIDED PORT INSERTION  11/29/2018   LYMPH NODE BIOPSY Left 12/09/2018   Procedure: LEFT DEEP CERVICAL LYMPH NODE BIOPSY;  Surgeon: Fanny Skates, MD;  Location: [facility];  Service: General;  Laterality: Left;   TUBAL LIGATION      SOCIAL HISTORY: Social History   Socioeconomic History   Marital status: Married    Spouse name: Not on file   Number of children: Not on file   Years of education: Not on file   Highest education level: 6th grade  Occupational History   Not on file  Tobacco Use   Smoking status: Never Smoker   Smokeless tobacco: Never Used  Vaping Use   Vaping Use: Never used  Substance and Sexual Activity   Alcohol use: Never   Drug use: Never   Sexual activity: Yes    Birth control/protection: Post-menopausal  Other Topics Concern   Not on file  Social History Narrative   Not on file   Social Determinants of Health   Financial Resource Strain:    Difficulty of Paying Living Expenses: Not on file  Food Insecurity:    Worried About Charity fundraiser in the Last Year: Not on file   [employer] of Food in the Last Year: Not on file  Transportation Needs: No Transportation Needs   Lack of Transportation (Medical): No   Lack of Transportation (Non-Medical): No  Physical Activity:    Days of Exercise per Week: Not on file   Minutes of Exercise per Session: Not on file  Stress:    Feeling of Stress : Not on file  Social  Connections:    Frequency of Communication with Friends and Family: Not on file   Frequency of Social Gatherings with Friends and Family: Not on file   Attends Religious Services: Not on file   Active Member of Clubs or Organizations: Not on file   Attends Archivist Meetings: Not on file   Marital Status: Not on file  Intimate Partner Violence:    Fear of Current or Ex-Partner: Not on file   Emotionally Abused: Not on file   Physically Abused: Not on file   Sexually Abused: Not on file    FAMILY HISTORY: Family History  Problem Relation Age of Onset   Heart disease Father    Arthritis Sister    Heart disease Sister    Colon cancer Neg Hx     ALLERGIES:  is allergic to nsaids.  MEDICATIONS:  Current Outpatient Medications  Medication Sig Dispense Refill   acetaminophen (TYLENOL) 325 MG tablet Take 325-650 mg by mouth every 6 (six) hours as needed for mild pain. (Patient not taking: Reported on 12/07/2019)     amLODipine (NORVASC) 5 MG tablet Take 1 tablet (5 mg total) by mouth daily.  [facility] 30 tablet 11   atorvastatin (LIPITOR) 20 MG tablet Take 1 tablet (20 mg total) by mouth daily. [facility] 30 tablet 11   B Complex Vitamins (B COMPLEX PO) Take 1 tablet by mouth daily. (Patient not taking: Reported on 11/10/2019)     calcium carbonate (TUMS - DOSED IN MG ELEMENTAL CALCIUM) 500 MG chewable tablet Chew 2 tablets by mouth daily as needed for indigestion or heartburn. (Patient not taking: Reported on 11/10/2019)     cholecalciferol (VITAMIN D3) 25 MCG (1000 UT) tablet Take 1 tablet (1,000 Units total) by mouth daily. 30 tablet 3   lidocaine-prilocaine (EMLA) cream Apply to affected area once (Patient taking differently: Apply 1 application topically as needed (For port-a-cath.). ) 30 g 3   mesalamine (LIALDA) 1.2 g EC tablet Take 2.4 g by mouth daily.      ondansetron (ZOFRAN) 8 MG tablet Take 1 tablet (8 mg total) by mouth 2 (two) times daily as  needed for refractory nausea / vomiting. Start on day 3 after cyclophosphamide chemotherapy. (Patient not taking: Reported on 10/28/2019) 30 tablet 1   polyethylene glycol (MIRALAX / GLYCOLAX) 17 g packet Take 17 g by mouth daily as needed for mild constipation. (Patient not taking: Reported on 11/10/2019)     potassium chloride SA (K-DUR) 20 MEQ tablet Take 1 tablet (20 mEq total) by mouth daily. 30 tablet 1   Prenatal Vit-Fe Fumarate-FA (MULTIVITAMIN-PRENATAL) 27-0.8 MG TABS tablet Take 1 tablet by mouth daily at 12 noon.      traZODone (DESYREL) 50 MG tablet Take 1 tablet (50 mg total) by mouth at bedtime as needed for sleep. (Patient taking differently: Take 25 mg by mouth at bedtime as needed for sleep. ) 30 tablet 3   No current facility-administered medications for this visit.    REVIEW OF SYSTEMS:   A 10+ POINT REVIEW OF SYSTEMS WAS OBTAINED including neurology, dermatology, psychiatry, cardiac, respiratory, lymph, extremities, GI, GU, Musculoskeletal, constitutional, breasts, reproductive, HEENT.  All pertinent positives are noted in the HPI.  All others are negative.   PHYSICAL EXAMINATION: ECOG PERFORMANCE STATUS: 2 - Symptomatic, <50% confined to bed  . Vitals:   01/27/20 1053  BP: (!) 137/91  Pulse: 79  Resp: 18  Temp: (!) 97.3 F (36.3 C)  SpO2: 98%   Filed Weights   01/27/20 1053  Weight: 154 lb 14.4 oz (70.3 kg)   .Body mass index is 30.25 kg/m.   GENERAL:alert, in no acute distress and comfortable SKIN: no acute rashes, no significant lesions EYES: conjunctiva are pink and non-injected, sclera anicteric OROPHARYNX: MMM, no exudates, no oropharyngeal erythema or ulceration NECK: supple, no JVD LYMPH:  no palpable lymphadenopathy in the cervical, axillary or inguinal regions LUNGS: clear to auscultation b/l with normal respiratory effort HEART: regular rate & rhythm ABDOMEN:  normoactive bowel sounds , non tender, not distended. No palpable hepatosplenomegaly.   Extremity: no pedal edema PSYCH: alert & oriented x 3 with fluent speech NEURO: no focal motor/sensory deficits  LABORATORY DATA:  I have reviewed the data as listed  . CBC Latest Ref Rng & Units 01/27/2020 10/28/2019 07/28/2019  WBC 4.0 - 10.5 K/uL 4.8 4.4 4.4  Hemoglobin 12.0 - 15.0 g/dL 12.9 12.7 12.1  Hematocrit 36 - 46 % 39.2 39.1 37.0  Platelets 150 - 400 K/uL 224 214 217    . CMP Latest Ref Rng & Units 01/27/2020 12/07/2019 10/28/2019  Glucose 70 - 99 mg/dL 93 88 82  BUN 6 -  20 mg/dL 16 - 14  Creatinine 0.44 - 1.00 mg/dL 0.80 - 0.79  Sodium 135 - 145 mmol/L 141 - 142  Potassium 3.5 - 5.1 mmol/L 3.8 - 3.9  Chloride 98 - 111 mmol/L 107 - 107  CO2 22 - 32 mmol/L 26 - 25  Calcium 8.9 - 10.3 mg/dL 9.2 - 9.3  Total Protein 6.5 - 8.1 g/dL 7.0 - 6.7  Total Bilirubin 0.3 - 1.2 mg/dL 0.4 - 0.4  Alkaline Phos 38 - 126 U/L 144(H) - 140(H)  AST 15 - 41 U/L 23 - 25  ALT 0 - 44 U/L 30 - 28   . Lab Results  Component Value Date   LDH 241 (H) 01/27/2020      RADIOGRAPHIC STUDIES: I have personally reviewed the radiological images as listed and agreed with the findings in the report. No results found.  ASSESSMENT & PLAN:   1. Stage IV DLBCL, GCB subtype;double hitlymphoma -S/p 1 cycle of R-CHOP and 5 cycles of da EPOCH-R and IT MTX x 4 for CNS prophylaxis -FISH  on 01/03/2019 and were positive for Myc and BCL2 rearrangement  02/14/2019 PET Scan Whole Body ([id number]) revealed "1. Findings favor complete metabolic response. No residual hypermetabolism within the lymph nodes of the neck, chest, abdomen or pelvis, which have all decreased in size in the interval. 2. Nonspecific new diffuse skeletal hypermetabolism and new mild splenic hypermetabolism, favor reactive state of the marrow and reticuloendothelial system. Spleen is normal size. Continued surveillance with CT or PET-CT advised. 3. Small layering left pleural effusion, decreased. 4.  Aortic Atherosclerosis  (ICD10-I70.0)."  -Completed 5 cycles of R-EPOCH on 04/04/2019, 6 total cycles of chemotherapy (1st cycle was R-CHOP) -Completed 4 cycles of IT METHOTREXATE on 04/25/2019  05/23/2019 PET/CT scan ([id number]) revealed "Small calcified jejunal mesentery nodes, compatible with treated lymphoma. Deauville category 2. No findings suspicious for active lymphoma. Prior splenic and osseous hypermetabolism is no longer evident."  PLAN:  -Discussed pt labwork today, 01/27/20; anemia has resolved, blood counts and chemistries are nml, LDH continues to elevate. -No clinical or lab evidence of DLBCL recurrence at this time. No indication for treatment. -Pt is currently 10 months out from treatment. Will continue to watch every 3 months for the first 2 years. -Advised pt that sometimes arthritis is improved temporarily during treatment due to the steroids. -Discussed CDC guidelines regarding the COVID19 booster. Plan to give pt in clinic today.  -Recommend pt f/u with Gastroenterolgosit for Lialda management. -Will refer pt to Dr. Enrique Sack for dental evaluation. -Will get repeat CT C/A/P in 11 weeks.  -Will see back in 3 months, with labs 1 week prior.  2. 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 Normocytic anemia Resolved hgb 12.7 today.   FOLLOW UP: Covid booster vaccine today Referral to [facility] Dr Loraine Grip for acute dental pain CT chest/abd/pelvis in 11 weeks Labs in 11 weeks RTC with Dr Irene Limbo in 12 weeks   The total time spent in the appt was 25 minutes and more than 50% was on counseling and direct patient cares.  All of the patient's questions were answered with apparent satisfaction. The patient knows to call the clinic with any problems, questions or concerns.   Sullivan Lone MD [place] AAHIVMS [facility] Hematology/Oncology Physician [facility]  (Office):       [phone] (Work cell):  [phone] (Fax):            [phone]  01/27/2020 11:49  AM  I, Yevette Edwards, am acting as a scribe for Dr. Sullivan Lone.   .I have reviewed the above documentation for accuracy and completeness, and I agree with the above. [name] MD

## 2020-01-27 NOTE — Progress Notes (Signed)
   Covid-19 Vaccination Clinic  Name:  Denise Walls    MRN: [id number] DOB: [birth year]  01/27/2020  [name] was observed post Covid-19 immunization for 15 minutes without incident. She was provided with Vaccine Information Sheet and instruction to access the V-Safe system.   [name] was instructed to call 911 with any severe reactions post vaccine: Marland Kitchen Difficulty breathing  . Swelling of face and throat  . A fast heartbeat  . A bad rash all over body  . Dizziness and weakness

## 2020-02-01 ENCOUNTER — Telehealth: Payer: Self-pay | Admitting: Gastroenterology

## 2020-02-01 NOTE — Telephone Encounter (Signed)
Would you like to refill this prescription?

## 2020-02-01 NOTE — Telephone Encounter (Signed)
Patients daughter called requesting refill on Lialda patient currently has no insurance.

## 2020-02-02 MED ORDER — MESALAMINE 1.2 G PO TBEC: 2.4000 g | DELAYED_RELEASE_TABLET | Freq: Every day | ORAL | 11 refills | Status: DC

## 2020-02-02 NOTE — Telephone Encounter (Signed)
Please do Can use generic. Looks like she is on mesalamine  (lialda) 2.4 g p.o. daily x 1 month, 11 refills RG

## 2020-02-02 NOTE — Telephone Encounter (Signed)
I have sent prescription to patients pharmacy.

## 2020-02-09 ENCOUNTER — Telehealth: Payer: Self-pay

## 2020-02-09 NOTE — Telephone Encounter (Signed)
Patients daughter returned my phone call about her mother's lab bill. Pilar resent a copy of the bill to my email. Informed her that I would resend to [facility] to get bill corrected.

## 2020-02-09 NOTE — Telephone Encounter (Signed)
Patient called about bill received from [facility] for bloodwork done with the [place] program. Patient stated that her daughter Georgiana Spinner emailed me a copy of the bill. Informed patient that I have not received any bills from her daughter and that she will need to resend it to me in order for me to get her account corrected.    Called daughter Georgiana Spinner as well and left voicemail about the bill as well. Asked her if she can help her mother send me a copy of the bill to my email. Left name and number for her daughter to call back.

## 2020-02-21 ENCOUNTER — Ambulatory Visit: Payer: No Typology Code available for payment source | Admitting: Gastroenterology

## 2020-02-21 ENCOUNTER — Encounter: Payer: Self-pay | Admitting: Gastroenterology

## 2020-02-21 VITALS — BP 136/80 | HR 68 | Ht 60.0 in | Wt 156.1 lb

## 2020-02-21 DIAGNOSIS — C8590 Non-Hodgkin lymphoma, unspecified, unspecified site: Secondary | ICD-10-CM

## 2020-02-21 DIAGNOSIS — K50919 Crohn's disease, unspecified, with unspecified complications: Secondary | ICD-10-CM

## 2020-02-21 MED ORDER — MESALAMINE 1.2 G PO TBEC: 2.4000 g | DELAYED_RELEASE_TABLET | Freq: Every day | ORAL | 11 refills | Status: DC

## 2020-02-21 NOTE — Patient Instructions (Addendum)
If you are age 60 or older, your body mass index should be between 23-30. Your Body mass index is 30.49 kg/m. If this is out of the aforementioned range listed, please consider follow up with your Primary Care Provider.  If you are age 33 or younger, your body mass index should be between 19-25. Your Body mass index is 30.49 kg/m. If this is out of the aformentioned range listed, please consider follow up with your Primary Care Provider.   Please complete financial assistance forms and return.   Follow up in 6 months.   Thank you,  Dr. Jackquline Denmark

## 2020-02-21 NOTE — Progress Notes (Signed)
Chief Complaint:   Referring Provider:  Riesa Pope, *      ASSESSMENT AND PLAN;   #1.  Abdo pain LLQ/LUQ (resolved)  #2. Stage IV diffuse large B-cell lymphoma 2020 s/p chemo (S/p 1 cycle of R-CHOP and 5 cycles of da EPOCH-R and IT MTX x 4 for CNS prophylaxis) in complete remission.  Last PET/CT Jan 2021 neg.  #3.  Likely quiescent Crohn's disease: Dx 2004, H/O PSBO 02/2007 managed conservatively, involving TI and right colon.  In remission with Lialda.  Neg colon 08/2014. Last colon 09/2017 Dr Jerilynn Mages neg to TI.   Plan: - Charity care paperwork given today.  I have reviewed Dr. Grier Mitts last note.  She is to have CT chest/Abdo/pelvis in December 2021. - Daughter to see if her mom qualifies for Obama-care - Continue lialda 2/day for now since she has significant symptomatic improvement.  Patient is convinced that if she gets off Lialda, she starts having flareup. - FU in 6 months. - At FU, consider EGD/colon, only if with any symptoms. - Discussed extensively with the patient and patient's daughter.  HPI:    Denise Walls is a 59 y.o. female  Speaks little English, history through daughter and interpreter. For follow-up visit. Has been doing very well from GI standpoint. Tolerating mesalamine 2.4 g p.o. daily.  Does not wish to go off as she had " flareup" when she went off in 2019 after last colonoscopy.  She had mild left lower/upper quadrant abdominal discomfort few weeks ago which has completely resolved.  No diarrhea or constipation.  She does take MiraLAX occasionally.  No melena or hematochezia.  Her last PET scan was in January which showed complete resolution of lymphoma.  Unfortunately, she has lost her insurance.  No upper GI symptoms.  Past GI procedures: -Colonoscopy 09/11/2014 ([facility]) small internal hemorrhoids.  No active Crohn's.  Normal TI. Bx- neg. -Colonoscopy 10/02/2017-normal to TI except for internal hemorrhoids.  Biopsies-terminal ileal  biopsies, right colonic biopsies and left colonic biopsies were unremarkable.  Recommended to repeat in 5 years.  -EGD 10/02/2017-gastritis, negative CLOtest  Past Medical History:  Diagnosis Date  . Acute cystitis   . Anxiety   . Arthritis   . Crohn disease ([place])    dx 2004, history of small bowel obstruction 02/2007 treated conservatively  . History of colon polyps   . History of shingles   . HTN (hypertension)   . Hypercholesterolemia   . Insomnia   . Migraine   . Uterine fibroid     Past Surgical History:  Procedure Laterality Date  . COLONOSCOPY  09/11/2014   Small internal hemorrhoids. Otherwise normal colonoscopy to terminal ileum  . COLONOSCOPY  10/02/2017  . ESOPHAGOGASTRODUODENOSCOPY  04/05/2007   Mild gastritis. Otherwise, normal esophagogastroduodenoscopy  . IR IMAGING GUIDED PORT INSERTION  11/29/2018  . LYMPH NODE BIOPSY Left 12/09/2018   Procedure: LEFT DEEP CERVICAL LYMPH NODE BIOPSY;  Surgeon: Fanny Skates, MD;  Location: [place];  Service: General;  Laterality: Left;  . TUBAL LIGATION      Family History  Problem Relation Age of Onset  . Heart disease Father   . Arthritis Sister   . Heart disease Sister   . Colon cancer Neg Hx     Social History   Tobacco Use  . Smoking status: Never Smoker  . Smokeless tobacco: Never Used  Vaping Use  . Vaping Use: Never used  Substance Use Topics  . Alcohol use: Never  . Drug  use: Never    Current Outpatient Medications  Medication Sig Dispense Refill  . cholecalciferol (VITAMIN D3) 25 MCG (1000 UT) tablet Take 1 tablet (1,000 Units total) by mouth daily. 30 tablet 3  . mesalamine (LIALDA) 1.2 g EC tablet Take 2 tablets (2.4 g total) by mouth daily. 30 tablet 11  . Prenatal Vit-Fe Fumarate-FA (MULTIVITAMIN-PRENATAL) 27-0.8 MG TABS tablet Take 1 tablet by mouth daily at 12 noon.     Marland Kitchen acetaminophen (TYLENOL) 325 MG tablet Take 325-650 mg by mouth every 6 (six) hours as needed for mild pain. (Patient not  taking: Reported on 12/07/2019)    . amLODipine (NORVASC) 5 MG tablet Take 1 tablet (5 mg total) by mouth daily. [place] (Patient not taking: Reported on 02/21/2020) 30 tablet 11  . atorvastatin (LIPITOR) 20 MG tablet Take 1 tablet (20 mg total) by mouth daily. [place] (Patient not taking: Reported on 02/21/2020) 30 tablet 11  . B Complex Vitamins (B COMPLEX PO) Take 1 tablet by mouth daily. (Patient not taking: Reported on 11/10/2019)    . calcium carbonate (TUMS - DOSED IN MG ELEMENTAL CALCIUM) 500 MG chewable tablet Chew 2 tablets by mouth daily as needed for indigestion or heartburn. (Patient not taking: Reported on 11/10/2019)    . lidocaine-prilocaine (EMLA) cream Apply to affected area once (Patient not taking: Reported on 02/21/2020) 30 g 3  . ondansetron (ZOFRAN) 8 MG tablet Take 1 tablet (8 mg total) by mouth 2 (two) times daily as needed for refractory nausea / vomiting. Start on day 3 after cyclophosphamide chemotherapy. (Patient not taking: Reported on 10/28/2019) 30 tablet 1  . polyethylene glycol (MIRALAX / GLYCOLAX) 17 g packet Take 17 g by mouth daily as needed for mild constipation. (Patient not taking: Reported on 11/10/2019)     No current facility-administered medications for this visit.    Allergies  Allergen Reactions  . Nsaids Anaphylaxis and Other (See Comments)    Abdominal pain, GI Bleeding     Review of Systems:  neg     Physical Exam:    BP 136/80   Pulse 68   Ht 5' (1.524 m)   Wt 156 lb 2 oz (70.8 kg)   LMP  (LMP Unknown)   BMI 30.49 kg/m  Filed Weights   02/21/20 1505  Weight: 156 lb 2 oz (70.8 kg)   Gen: awake, alert, NAD HEENT: anicteric, no pallor CV: RRR, no mrg Pulm: CTA b/l Abd: soft, NT/ND, +BS throughout Ext: no c/c/e Neuro: nonfocal    Denise Austria, MD 02/21/2020, 3:20 PM  Cc: Riesa Pope, *

## 2020-03-06 ENCOUNTER — Telehealth: Payer: Self-pay | Admitting: *Deleted

## 2020-03-06 NOTE — Telephone Encounter (Signed)
Connected with Denise Walls daughter Hart Rochester [phone]) notifying her provider unable to certify/complete [employer] Disability form.   Completed treatment and recovery.  Contact PCP if experiencing continued deficits.  "Thank you.  She gets tired easily.  Has been released from previous job.  Family will help her find an easier job if needed."

## 2020-03-30 ENCOUNTER — Telehealth: Payer: Self-pay | Admitting: Hematology

## 2020-03-30 NOTE — Telephone Encounter (Signed)
Scheduled per 09/10 los, patient has been called and notified.

## 2020-04-11 ENCOUNTER — Other Ambulatory Visit: Payer: Self-pay | Admitting: *Deleted

## 2020-04-11 DIAGNOSIS — C8330 Diffuse large B-cell lymphoma, unspecified site: Secondary | ICD-10-CM

## 2020-04-13 ENCOUNTER — Inpatient Hospital Stay: Payer: Self-pay | Attending: Hematology

## 2020-04-13 ENCOUNTER — Other Ambulatory Visit: Payer: Self-pay

## 2020-04-13 DIAGNOSIS — C8330 Diffuse large B-cell lymphoma, unspecified site: Secondary | ICD-10-CM

## 2020-04-13 DIAGNOSIS — C8339 Diffuse large B-cell lymphoma, extranodal and solid organ sites: Secondary | ICD-10-CM | POA: Insufficient documentation

## 2020-04-13 LAB — CBC WITH DIFFERENTIAL (CANCER CENTER ONLY)
Abs Immature Granulocytes: 0 10*3/uL (ref 0.00–0.07)
Basophils Absolute: 0.1 10*3/uL (ref 0.0–0.1)
Basophils Relative: 1 %
Eosinophils Absolute: 0.1 10*3/uL (ref 0.0–0.5)
Eosinophils Relative: 2 %
HCT: 40.4 % (ref 36.0–46.0)
Hemoglobin: 12.7 g/dL (ref 12.0–15.0)
Immature Granulocytes: 0 %
Lymphocytes Relative: 39 %
Lymphs Abs: 1.8 10*3/uL (ref 0.7–4.0)
MCH: 28.3 pg (ref 26.0–34.0)
MCHC: 31.4 g/dL (ref 30.0–36.0)
MCV: 90.2 fL (ref 80.0–100.0)
Monocytes Absolute: 0.4 10*3/uL (ref 0.1–1.0)
Monocytes Relative: 8 %
Neutro Abs: 2.3 10*3/uL (ref 1.7–7.7)
Neutrophils Relative %: 50 %
Platelet Count: 221 10*3/uL (ref 150–400)
RBC: 4.48 MIL/uL (ref 3.87–5.11)
RDW: 13.8 % (ref 11.5–15.5)
WBC Count: 4.6 10*3/uL (ref 4.0–10.5)
nRBC: 0 % (ref 0.0–0.2)

## 2020-04-13 LAB — CMP (CANCER CENTER ONLY)
ALT: 25 U/L (ref 0–44)
AST: 23 U/L (ref 15–41)
Albumin: 3.9 g/dL (ref 3.5–5.0)
Alkaline Phosphatase: 128 U/L — ABNORMAL HIGH (ref 38–126)
Anion gap: 8 (ref 5–15)
BUN: 17 mg/dL (ref 6–20)
CO2: 26 mmol/L (ref 22–32)
Calcium: 9.1 mg/dL (ref 8.9–10.3)
Chloride: 106 mmol/L (ref 98–111)
Creatinine: 0.81 mg/dL (ref 0.44–1.00)
GFR, Estimated: >60 mL/min (ref >60)
Glucose, Bld: 89 mg/dL (ref 70–99)
Potassium: 3.8 mmol/L (ref 3.5–5.1)
Sodium: 140 mmol/L (ref 135–145)
Total Bilirubin: 0.4 mg/dL (ref 0.3–1.2)
Total Protein: 6.7 g/dL (ref 6.5–8.1)

## 2020-04-13 LAB — LACTATE DEHYDROGENASE: LDH: 216 U/L — ABNORMAL HIGH (ref 98–192)

## 2020-04-20 ENCOUNTER — Other Ambulatory Visit: Payer: Self-pay

## 2020-04-20 ENCOUNTER — Inpatient Hospital Stay: Payer: Self-pay | Attending: Hematology | Admitting: Hematology

## 2020-04-20 VITALS — BP 135/116 | HR 65 | Temp 96.8°F | Resp 18 | Ht 60.0 in | Wt 158.0 lb

## 2020-04-20 DIAGNOSIS — C8330 Diffuse large B-cell lymphoma, unspecified site: Secondary | ICD-10-CM | POA: Insufficient documentation

## 2020-04-20 DIAGNOSIS — Z23 Encounter for immunization: Secondary | ICD-10-CM | POA: Insufficient documentation

## 2020-04-20 MED ORDER — INFLUENZA VAC SPLIT QUAD 0.5 ML IM SUSY
PREFILLED_SYRINGE | INTRAMUSCULAR | Status: AC
  Filled 2020-04-20: qty 0.5

## 2020-04-20 MED ORDER — INFLUENZA VAC SPLIT QUAD 0.5 ML IM SUSY
0.5000 mL | PREFILLED_SYRINGE | Freq: Once | INTRAMUSCULAR | Status: AC
  Administered 2020-04-20: 0.5 mL via INTRAMUSCULAR

## 2020-04-20 NOTE — Progress Notes (Signed)
HEMATOLOGY/ONCOLOGY CLINIC NOTE  Date of Service: 04/20/2020  Patient Care Team: Riesa Pope, MD as PCP - General (Internal Medicine)  CHIEF COMPLAINTS/PURPOSE OF CONSULTATION:  DLBCL (diffuse large B cell lymphoma)   HISTORY OF PRESENTING ILLNESS:  Denise Walls is a wonderful 59 y.o. female with a past medical history significant for anxiety, Crohn's disease, hypertension, hyperlipidemia, migraine headaches.  The patient's history is obtained through a Spanish interpreter via video, Denise Walls.  The patient reports that she has history of Crohn's disease, for which she has been taking mesalamine with very good control of her symptoms. She was previously followed by Dr. Kathy Breach gastroenterology and to transition her care to a different gastroenterologist after Dr.Guptamoved to [facility]. Her new gastroenterologist stopped her mesalamine some time in 2019,and soon after that, she began to develop new onset left lower quadrant discomfort/pain,nonradiating, dull, intermittent, mild to moderate in intensity, exacerbated by eating and improved with defecation. She underwent colonoscopy, which was unremarkable, and was recommended to modify her diet without any improvement. She then re-established her care with Dr. Etter Sjogren ordered CT abdomen/pelvis that showed diffuse abdominal lymphadenopathy. PET showed diffuse lymph node involvement in the neck, chest, abdomen and the pelvis,and patient was referred to oncology for further evaluation.  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 The patient initially received 1 cycle of R-CHOP which she tolerated fairly well with exception of hyponatremia and neutropenia.  Prior to her second planned cycle of chemotherapy, she was found to be positive for Myc and BCL-2 rearrangement.  She is now receiving  R-EPOCH and is here for cycle #2 of his  chemotherapy.  Today, the patient reports that she is feeling well overall.  She reports a very good appetite and has been eating a lot at home.  She reports that her breathing is stable and maybe even slightly improved.  Had an episode of dizziness and vomiting and was seen in the ER last week. Symptoms have now resolved. Today, she denies any dizziness, headaches, chest discomfort, cough.  Denies abdominal pain, nausea, vomiting, constipation, diarrhea.  Denies bleeding.  The patient is here for admission for cycle #2 of R-EPOCH.  INTERVAL HISTORY:  Denise Walls is a wonderful 59 y.o. female who is here for evaluation and management of DLBCL. We are joined today by a Patent attorney. The patient's last visit with [place] was on 01/27/2020. The pt reports that she is doing well overall.  The pt reports that some days she feels well and full of energy, other days she is very fatigued. Pt endorses tightness in her lower back. Pt has a repeat Colonoscopy coming up so that they can consider adjusting her medications for her Crohn's.   Lab results (04/13/20) of CBC w/diff and CMP is as follows: all values are WNL except for ALP at 128. 04/13/2020 LDH at 216  On review of systems, pt reports eating well, lower back discomfort and denies new lumps/bumps, fevers, chills, night sweats, SOB, abdominal pain and any other symptoms.   MEDICAL HISTORY:  Past Medical History:  Diagnosis Date  . Acute cystitis   . Anxiety   . Arthritis   . Crohn disease ([place])    dx 2004, history of small bowel obstruction 02/2007 treated conservatively  . History of colon polyps   . History of shingles   . HTN (hypertension)   . Hypercholesterolemia   . Insomnia   . Migraine   .  Uterine fibroid     SURGICAL HISTORY: Past Surgical History:  Procedure Laterality Date  . COLONOSCOPY  09/11/2014   Small internal hemorrhoids. Otherwise normal colonoscopy to terminal ileum  . COLONOSCOPY  10/02/2017  .  ESOPHAGOGASTRODUODENOSCOPY  04/05/2007   Mild gastritis. Otherwise, normal esophagogastroduodenoscopy  . IR IMAGING GUIDED PORT INSERTION  11/29/2018  . LYMPH NODE BIOPSY Left 12/09/2018   Procedure: LEFT DEEP CERVICAL LYMPH NODE BIOPSY;  Surgeon: Fanny Skates, MD;  Location: [place];  Service: General;  Laterality: Left;  . TUBAL LIGATION      SOCIAL HISTORY: Social History   Socioeconomic History  . Marital status: Married    Spouse name: Not on file  . Number of children: Not on file  . Years of education: Not on file  . Highest education level: 6th grade  Occupational History  . Not on file  Tobacco Use  . Smoking status: Never Smoker  . Smokeless tobacco: Never Used  Vaping Use  . Vaping Use: Never used  Substance and Sexual Activity  . Alcohol use: Never  . Drug use: Never  . Sexual activity: Yes    Birth control/protection: Post-menopausal  Other Topics Concern  . Not on file  Social History Narrative  . Not on file   Social Determinants of Health   Financial Resource Strain:   . Difficulty of Paying Living Expenses: Not on file  Food Insecurity:   . Worried About Charity fundraiser in the Last Year: Not on file  . Ran Out of Food in the Last Year: Not on file  Transportation Needs: No Transportation Needs  . Lack of Transportation (Medical): No  . Lack of Transportation (Non-Medical): No  Physical Activity:   . Days of Exercise per Week: Not on file  . Minutes of Exercise per Session: Not on file  Stress:   . Feeling of Stress : Not on file  Social Connections:   . Frequency of Communication with Friends and Family: Not on file  . Frequency of Social Gatherings with Friends and Family: Not on file  . Attends Religious Services: Not on file  . Active Member of Clubs or Organizations: Not on file  . Attends Archivist Meetings: Not on file  . Marital Status: Not on file  Intimate Partner Violence:   . Fear of Current or Ex-Partner: Not on file   . Emotionally Abused: Not on file  . Physically Abused: Not on file  . Sexually Abused: Not on file    FAMILY HISTORY: Family History  Problem Relation Age of Onset  . Heart disease Father   . Arthritis Sister   . Heart disease Sister   . Colon cancer Neg Hx     ALLERGIES:  is allergic to nsaids.  MEDICATIONS:  Current Outpatient Medications  Medication Sig Dispense Refill  . acetaminophen (TYLENOL) 325 MG tablet Take 325-650 mg by mouth every 6 (six) hours as needed for mild pain. (Patient not taking: Reported on 12/07/2019)    . amLODipine (NORVASC) 5 MG tablet Take 1 tablet (5 mg total) by mouth daily. [place] (Patient not taking: Reported on 02/21/2020) 30 tablet 11  . atorvastatin (LIPITOR) 20 MG tablet Take 1 tablet (20 mg total) by mouth daily. [place] (Patient not taking: Reported on 02/21/2020) 30 tablet 11  . B Complex Vitamins (B COMPLEX PO) Take 1 tablet by mouth daily. (Patient not taking: Reported on 11/10/2019)    . calcium carbonate (TUMS - DOSED IN  MG ELEMENTAL CALCIUM) 500 MG chewable tablet Chew 2 tablets by mouth daily as needed for indigestion or heartburn. (Patient not taking: Reported on 11/10/2019)    . cholecalciferol (VITAMIN D3) 25 MCG (1000 UT) tablet Take 1 tablet (1,000 Units total) by mouth daily. 30 tablet 3  . lidocaine-prilocaine (EMLA) cream Apply to affected area once (Patient not taking: Reported on 02/21/2020) 30 g 3  . mesalamine (LIALDA) 1.2 g EC tablet Take 2 tablets (2.4 g total) by mouth daily. 30 tablet 11  . ondansetron (ZOFRAN) 8 MG tablet Take 1 tablet (8 mg total) by mouth 2 (two) times daily as needed for refractory nausea / vomiting. Start on day 3 after cyclophosphamide chemotherapy. (Patient not taking: Reported on 10/28/2019) 30 tablet 1  . polyethylene glycol (MIRALAX / GLYCOLAX) 17 g packet Take 17 g by mouth daily as needed for mild constipation. (Patient not taking: Reported on 11/10/2019)    . Prenatal Vit-Fe Fumarate-FA  (MULTIVITAMIN-PRENATAL) 27-0.8 MG TABS tablet Take 1 tablet by mouth daily at 12 noon.      No current facility-administered medications for this visit.    REVIEW OF SYSTEMS:   A 10+ POINT REVIEW OF SYSTEMS WAS OBTAINED including neurology, dermatology, psychiatry, cardiac, respiratory, lymph, extremities, GI, GU, Musculoskeletal, constitutional, breasts, reproductive, HEENT.  All pertinent positives are noted in the HPI.  All others are negative.   PHYSICAL EXAMINATION: ECOG PERFORMANCE STATUS: 2 - Symptomatic, <50% confined to bed  . Vitals:   04/20/20 1159  BP: (!) 135/116  Pulse: 65  Resp: 18  Temp: (!) 96.8 F (36 C)  SpO2: 98%   Filed Weights   04/20/20 1159  Weight: 158 lb (71.7 kg)   .Body mass index is 30.86 kg/m.   GENERAL:alert, in no acute distress and comfortable SKIN: no acute rashes, no significant lesions EYES: conjunctiva are pink and non-injected, sclera anicteric OROPHARYNX: MMM, no exudates, no oropharyngeal erythema or ulceration NECK: supple, no JVD LYMPH:  no palpable lymphadenopathy in the cervical, axillary or inguinal regions LUNGS: clear to auscultation b/l with normal respiratory effort HEART: regular rate & rhythm ABDOMEN:  normoactive bowel sounds , non tender, not distended. No palpable hepatosplenomegaly.  Extremity: no pedal edema PSYCH: alert & oriented x 3 with fluent speech NEURO: no focal motor/sensory deficits  LABORATORY DATA:  I have reviewed the data as listed  . CBC Latest Ref Rng & Units 04/13/2020 01/27/2020 10/28/2019  WBC 4.0 - 10.5 K/uL 4.6 4.8 4.4  Hemoglobin 12.0 - 15.0 g/dL 12.7 12.9 12.7  Hematocrit 36 - 46 % 40.4 39.2 39.1  Platelets 150 - 400 K/uL 221 224 214    . CMP Latest Ref Rng & Units 04/13/2020 01/27/2020 12/07/2019  Glucose 70 - 99 mg/dL 89 93 88  BUN 6 - 20 mg/dL 17 16 -  Creatinine 0.44 - 1.00 mg/dL 0.81 0.80 -  Sodium 135 - 145 mmol/L 140 141 -  Potassium 3.5 - 5.1 mmol/L 3.8 3.8 -  Chloride 98 -  111 mmol/L 106 107 -  CO2 22 - 32 mmol/L 26 26 -  Calcium 8.9 - 10.3 mg/dL 9.1 9.2 -  Total Protein 6.5 - 8.1 g/dL 6.7 7.0 -  Total Bilirubin 0.3 - 1.2 mg/dL 0.4 0.4 -  Alkaline Phos 38 - 126 U/L 128(H) 144(H) -  AST 15 - 41 U/L 23 23 -  ALT 0 - 44 U/L 25 30 -   . Lab Results  Component Value Date   LDH 216 (H)  04/13/2020      RADIOGRAPHIC STUDIES: I have personally reviewed the radiological images as listed and agreed with the findings in the report. No results found.  ASSESSMENT & PLAN:   1. Stage IV DLBCL, GCB subtype;double hitlymphoma -S/p 1 cycle of R-CHOP and 5 cycles of da EPOCH-R and IT MTX x 4 for CNS prophylaxis -FISH  on 01/03/2019 and were positive for Myc and BCL2 rearrangement  02/14/2019 PET Scan Whole Body ([id number]) revealed "1. Findings favor complete metabolic response. No residual hypermetabolism within the lymph nodes of the neck, chest, abdomen or pelvis, which have all decreased in size in the interval. 2. Nonspecific new diffuse skeletal hypermetabolism and new mild splenic hypermetabolism, favor reactive state of the marrow and reticuloendothelial system. Spleen is normal size. Continued surveillance with CT or PET-CT advised. 3. Small layering left pleural effusion, decreased. 4.  Aortic Atherosclerosis (ICD10-I70.0)."  -Completed 5 cycles of R-EPOCH on 04/04/2019, 6 total cycles of chemotherapy (1st cycle was R-CHOP) -Completed 4 cycles of IT METHOTREXATE on 04/25/2019  05/23/2019 PET/CT scan ([id number]) revealed "Small calcified jejunal mesentery nodes, compatible with treated lymphoma. Deauville category 2. No findings suspicious for active lymphoma. Prior splenic and osseous hypermetabolism is no longer evident."  PLAN:  -Discussed pt labwork today, 04/20/20; blood counts are nml, ALP is borderline elevated, other blood chemistries are nml, LDH is stable. -No clinical or lab evidence of DLBCL recurrence at this time. No indication for treatment.  Will continue watchful observation. 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 F/u with PCP & GI.  -Recommend pt get the annual flu vaccine. Will give in clinic today. -Recommend pt f/u for Colonoscopy as scheduled.  -Will get rpt CT C/A/P in 11 weeks -Will see back in 3 months with labs   2. 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 Normocytic anemia Resolved hgb 12.7 today.   FOLLOW UP: CT chest/abd/pelvis in 11 weeks Portflush and labs in 11 weeks RTC with Dr Irene Limbo in 12 weeks  The total time spent in the appt was 25 minutes and more than 50% was on counseling and direct patient cares.  All of the patient's questions were answered with apparent satisfaction. The patient knows to call the clinic with any problems, questions or concerns.   Sullivan Lone MD [place] AAHIVMS [facility] Hematology/Oncology Physician [facility]  (Office):       [phone] (Work cell):  [phone] (Fax):           [phone]  04/20/2020 12:40 PM  I, Yevette Edwards, am acting as a scribe for Dr. Sullivan Lone.   .I have reviewed the above documentation for accuracy and completeness, and I agree with the above. [name] MD

## 2020-06-26 ENCOUNTER — Telehealth: Payer: Self-pay | Admitting: Hematology

## 2020-06-26 NOTE — Telephone Encounter (Signed)
Called patient regarding 02/18 and 02/25 appointment, patient is and notified.

## 2020-07-06 ENCOUNTER — Encounter (HOSPITAL_COMMUNITY): Payer: Self-pay

## 2020-07-06 ENCOUNTER — Ambulatory Visit (HOSPITAL_COMMUNITY)
Admission: RE | Admit: 2020-07-06 | Discharge: 2020-07-06 | Disposition: A | Discharge status: Home / Self Care | Payer: BC Managed Care – PPO | Source: Ambulatory Visit | Attending: Hematology | Admitting: Hematology

## 2020-07-06 ENCOUNTER — Other Ambulatory Visit: Payer: Self-pay

## 2020-07-06 ENCOUNTER — Inpatient Hospital Stay: Payer: BC Managed Care – PPO | Attending: Hematology

## 2020-07-06 ENCOUNTER — Inpatient Hospital Stay: Payer: BC Managed Care – PPO

## 2020-07-06 DIAGNOSIS — C8330 Diffuse large B-cell lymphoma, unspecified site: Secondary | ICD-10-CM | POA: Diagnosis present

## 2020-07-06 DIAGNOSIS — C83398 Diffuse large b-cell lymphoma of other extranodal and solid organ sites: Secondary | ICD-10-CM

## 2020-07-06 DIAGNOSIS — Z95828 Presence of other vascular implants and grafts: Secondary | ICD-10-CM

## 2020-07-06 DIAGNOSIS — C8339 Diffuse large B-cell lymphoma, extranodal and solid organ sites: Secondary | ICD-10-CM

## 2020-07-06 HISTORY — DX: Malignant (primary) neoplasm, unspecified: C80.1

## 2020-07-06 LAB — CBC WITH DIFFERENTIAL/PLATELET
Abs Immature Granulocytes: 0.01 10*3/uL (ref 0.00–0.07)
Basophils Absolute: 0 10*3/uL (ref 0.0–0.1)
Basophils Relative: 1 %
Eosinophils Absolute: 0.1 10*3/uL (ref 0.0–0.5)
Eosinophils Relative: 1 %
HCT: 40 % (ref 36.0–46.0)
Hemoglobin: 13 g/dL (ref 12.0–15.0)
Immature Granulocytes: 0 %
Lymphocytes Relative: 33 %
Lymphs Abs: 1.8 10*3/uL (ref 0.7–4.0)
MCH: 28.9 pg (ref 26.0–34.0)
MCHC: 32.5 g/dL (ref 30.0–36.0)
MCV: 88.9 fL (ref 80.0–100.0)
Monocytes Absolute: 0.5 10*3/uL (ref 0.1–1.0)
Monocytes Relative: 8 %
Neutro Abs: 3.1 10*3/uL (ref 1.7–7.7)
Neutrophils Relative %: 57 %
Platelets: 224 10*3/uL (ref 150–400)
RBC: 4.5 MIL/uL (ref 3.87–5.11)
RDW: 13.4 % (ref 11.5–15.5)
WBC: 5.4 10*3/uL (ref 4.0–10.5)
nRBC: 0 % (ref 0.0–0.2)

## 2020-07-06 LAB — CMP (CANCER CENTER ONLY)
ALT: 27 U/L (ref 0–44)
AST: 27 U/L (ref 15–41)
Albumin: 4.1 g/dL (ref 3.5–5.0)
Alkaline Phosphatase: 114 U/L (ref 38–126)
Anion gap: 7 (ref 5–15)
BUN: 15 mg/dL (ref 6–20)
CO2: 24 mmol/L (ref 22–32)
Calcium: 9 mg/dL (ref 8.9–10.3)
Chloride: 107 mmol/L (ref 98–111)
Creatinine: 0.83 mg/dL (ref 0.44–1.00)
GFR, Estimated: >60 mL/min (ref >60)
Glucose, Bld: 93 mg/dL (ref 70–99)
Potassium: 3.9 mmol/L (ref 3.5–5.1)
Sodium: 138 mmol/L (ref 135–145)
Total Bilirubin: 0.4 mg/dL (ref 0.3–1.2)
Total Protein: 7.2 g/dL (ref 6.5–8.1)

## 2020-07-06 LAB — LACTATE DEHYDROGENASE: LDH: 249 U/L — ABNORMAL HIGH (ref 98–192)

## 2020-07-06 MED ORDER — IOHEXOL 300 MG/ML  SOLN
100.0000 mL | Freq: Once | INTRAMUSCULAR | Status: AC | PRN
  Administered 2020-07-06: 100 mL via INTRAVENOUS

## 2020-07-06 MED ORDER — SODIUM CHLORIDE 0.9% FLUSH
10.0000 mL | Freq: Once | INTRAVENOUS | Status: AC
  Administered 2020-07-06: 10 mL
  Filled 2020-07-06: qty 10

## 2020-07-06 MED ORDER — HEPARIN SOD (PORK) LOCK FLUSH 100 UNIT/ML IV SOLN
500.0000 [IU] | Freq: Once | INTRAVENOUS | Status: AC
  Administered 2020-07-06: 500 [IU] via INTRAVENOUS

## 2020-07-06 MED ORDER — HEPARIN SOD (PORK) LOCK FLUSH 100 UNIT/ML IV SOLN
INTRAVENOUS | Status: AC
  Filled 2020-07-06: qty 5

## 2020-07-12 NOTE — Progress Notes (Signed)
HEMATOLOGY/ONCOLOGY CLINIC NOTE  Date of Service: 07/13/2020  Patient Care Team: Riesa Pope, MD as PCP - General (Internal Medicine)  CHIEF COMPLAINTS/PURPOSE OF CONSULTATION:  DLBCL (diffuse large B cell lymphoma)   HISTORY OF PRESENTING ILLNESS:  Denise Walls is a wonderful 60 y.o. female with a past medical history significant for anxiety, Crohn's disease, hypertension, hyperlipidemia, migraine headaches.  The patient's history is obtained through a Spanish interpreter via video, [place].  The patient reports that she has history of Crohn's disease, for which she has been taking mesalamine with very good control of her symptoms. She was previously followed by Dr. Kathy Breach gastroenterology and to transition her care to a different gastroenterologist after Dr.Guptamoved to [facility]. Her new gastroenterologist stopped her mesalamine some time in 2019,and soon after that, she began to develop new onset left lower quadrant discomfort/pain,nonradiating, dull, intermittent, mild to moderate in intensity, exacerbated by eating and improved with defecation. She underwent colonoscopy, which was unremarkable, and was recommended to modify her diet without any improvement. She then re-established her care with Dr. Etter Sjogren ordered CT abdomen/pelvis that showed diffuse abdominal lymphadenopathy. PET showed diffuse lymph node involvement in the neck, chest, abdomen and the pelvis,and patient was referred to oncology for further evaluation.  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 The patient initially received 1 cycle of R-CHOP which she tolerated fairly well with exception of hyponatremia and neutropenia.  Prior to her second planned cycle of chemotherapy, she was found to be positive for Myc and BCL-2 rearrangement.  She is now receiving  R-EPOCH and is here for cycle #2 of his  chemotherapy.  Today, the patient reports that she is feeling well overall.  She reports a very good appetite and has been eating a lot at home.  She reports that her breathing is stable and maybe even slightly improved.  Had an episode of dizziness and vomiting and was seen in the ER last week. Symptoms have now resolved. Today, she denies any dizziness, headaches, chest discomfort, cough.  Denies abdominal pain, nausea, vomiting, constipation, diarrhea.  Denies bleeding.  The patient is here for admission for cycle #2 of R-EPOCH.  INTERVAL HISTORY:   Denise Walls is a wonderful 60 y.o. female who is here for evaluation and management of DLBCL. We are joined today by a Patent attorney. The patient's last visit with [place] was on 04/20/2020. The pt reports that she is doing well overall.  The pt reports that she has been feeling very well, but notes some weight gain. She has no new concerns nor medication changes. She notes tightness on her left calf, but denies any cramping. She alleviates this by massaging it and resting. The pt notes that her sleep has improved recently and is becoming more consistent. The pt has received her COVID vaccines and booster. The pt notes she experienced two days of neck swelling but this resolved on its own and is no longer bothersome.  Of note since last visit, the pt has had CT Chest/Abd/Pel w contrast ([id number]) on 07/06/2020, which revealed "1. No evidence of recurrent disease in the chest, abdomen or pelvis. 2. Post treatment changes in the chest and abdomen as described. 3. Subtle sclerosis at L2 unchanged since January of 2020, may also be indicative of treated disease and did not show uptake on prior PET imaging, attention on follow-up. 4. Hepatic steatosis. 5. Aortic atherosclerosis."  Lab results today  07/06/2020 of CBC w/diff and CMP is as follows: all values are WNL. 07/06/2020 LDH of 249.  On review of systems, pt reports intermittent left calf  muscle tightness and denies fatigue, difficulty sleeping, abdominal pain, back pain, neck pain, leg swelling, unexpected weight loss, and any other symptoms.  MEDICAL HISTORY:  Past Medical History:  Diagnosis Date  . Acute cystitis   . Anxiety   . Arthritis   . Cancer ([place])   . Crohn disease ([place])    dx 2004, history of small bowel obstruction 02/2007 treated conservatively  . History of colon polyps   . History of shingles   . HTN (hypertension)   . Hypercholesterolemia   . Insomnia   . Migraine   . Uterine fibroid     SURGICAL HISTORY: Past Surgical History:  Procedure Laterality Date  . COLONOSCOPY  09/11/2014   Small internal hemorrhoids. Otherwise normal colonoscopy to terminal ileum  . COLONOSCOPY  10/02/2017  . ESOPHAGOGASTRODUODENOSCOPY  04/05/2007   Mild gastritis. Otherwise, normal esophagogastroduodenoscopy  . IR IMAGING GUIDED PORT INSERTION  11/29/2018  . LYMPH NODE BIOPSY Left 12/09/2018   Procedure: LEFT DEEP CERVICAL LYMPH NODE BIOPSY;  Surgeon: Fanny Skates, MD;  Location: [facility];  Service: General;  Laterality: Left;  . TUBAL LIGATION      SOCIAL HISTORY: Social History   Socioeconomic History  . Marital status: Married    Spouse name: Not on file  . Number of children: Not on file  . Years of education: Not on file  . Highest education level: 6th grade  Occupational History  . Not on file  Tobacco Use  . Smoking status: Never Smoker  . Smokeless tobacco: Never Used  Vaping Use  . Vaping Use: Never used  Substance and Sexual Activity  . Alcohol use: Never  . Drug use: Never  . Sexual activity: Yes    Birth control/protection: Post-menopausal  Other Topics Concern  . Not on file  Social History Narrative  . Not on file   Social Determinants of Health   Financial Resource Strain: Not on file  Food Insecurity: Not on file  Transportation Needs: No Transportation Needs  . Lack of Transportation (Medical): No  . Lack of Transportation  (Non-Medical): No  Physical Activity: Not on file  Stress: Not on file  Social Connections: Not on file  Intimate Partner Violence: Not on file    FAMILY HISTORY: Family History  Problem Relation Age of Onset  . Heart disease Father   . Arthritis Sister   . Heart disease Sister   . Colon cancer Neg Hx     ALLERGIES:  is allergic to nsaids.  MEDICATIONS:  Current Outpatient Medications  Medication Sig Dispense Refill  . acetaminophen (TYLENOL) 325 MG tablet Take 325-650 mg by mouth every 6 (six) hours as needed for mild pain. (Patient not taking: Reported on 12/07/2019)    . amLODipine (NORVASC) 5 MG tablet Take 1 tablet (5 mg total) by mouth daily. Toyah (Patient not taking: Reported on 02/21/2020) 30 tablet 11  . atorvastatin (LIPITOR) 20 MG tablet Take 1 tablet (20 mg total) by mouth daily. Blanco (Patient not taking: Reported on 02/21/2020) 30 tablet 11  . B Complex Vitamins (B COMPLEX PO) Take 1 tablet by mouth daily. (Patient not taking: Reported on 11/10/2019)    . calcium carbonate (TUMS - DOSED IN MG ELEMENTAL CALCIUM) 500 MG chewable tablet Chew 2 tablets by mouth daily as needed for indigestion or heartburn. (Patient  not taking: Reported on 11/10/2019)    . cholecalciferol (VITAMIN D3) 25 MCG (1000 UT) tablet Take 1 tablet (1,000 Units total) by mouth daily. 30 tablet 3  . lidocaine-prilocaine (EMLA) cream Apply to affected area once (Patient not taking: Reported on 02/21/2020) 30 g 3  . mesalamine (LIALDA) 1.2 g EC tablet Take 2 tablets (2.4 g total) by mouth daily. 30 tablet 11  . ondansetron (ZOFRAN) 8 MG tablet Take 1 tablet (8 mg total) by mouth 2 (two) times daily as needed for refractory nausea / vomiting. Start on day 3 after cyclophosphamide chemotherapy. (Patient not taking: Reported on 10/28/2019) 30 tablet 1  . polyethylene glycol (MIRALAX / GLYCOLAX) 17 g packet Take 17 g by mouth daily as needed for mild constipation. (Patient not taking: Reported on 11/10/2019)     . Prenatal Vit-Fe Fumarate-FA (MULTIVITAMIN-PRENATAL) 27-0.8 MG TABS tablet Take 1 tablet by mouth daily at 12 noon.      No current facility-administered medications for this visit.    REVIEW OF SYSTEMS:   10 Point review of Systems was done is negative except as noted above.  PHYSICAL EXAMINATION: ECOG PERFORMANCE STATUS: 2 - Symptomatic, <50% confined to bed  . Vitals:   07/13/20 1019  BP: 139/82  Pulse: 67  Resp: 15  Temp: 97.7 F (36.5 C)  SpO2: 100%   Filed Weights   07/13/20 1019  Weight: 161 lb 9.6 oz (73.3 kg)   .Body mass index is 31.56 kg/m.   GENERAL:alert, in no acute distress and comfortable SKIN: no acute rashes, no significant lesions EYES: conjunctiva are pink and non-injected, sclera anicteric OROPHARYNX: MMM, no exudates, no oropharyngeal erythema or ulceration NECK: supple, no JVD LYMPH:  no palpable lymphadenopathy in the cervical, axillary or inguinal regions LUNGS: clear to auscultation b/l with normal respiratory effort HEART: regular rate & rhythm ABDOMEN:  normoactive bowel sounds , non tender, not distended. Extremity: no pedal edema PSYCH: alert & oriented x 3 with fluent speech NEURO: no focal motor/sensory deficits  LABORATORY DATA:  I have reviewed the data as listed  . CBC Latest Ref Rng & Units 07/06/2020 04/13/2020 01/27/2020  WBC 4.0 - 10.5 K/uL 5.4 4.6 4.8  Hemoglobin 12.0 - 15.0 g/dL 13.0 12.7 12.9  Hematocrit 36.0 - 46.0 % 40.0 40.4 39.2  Platelets 150 - 400 K/uL 224 221 224    . CMP Latest Ref Rng & Units 07/06/2020 04/13/2020 01/27/2020  Glucose 70 - 99 mg/dL 93 89 93  BUN 6 - 20 mg/dL 15 17 16   Creatinine 0.44 - 1.00 mg/dL 0.83 0.81 0.80  Sodium 135 - 145 mmol/L 138 140 141  Potassium 3.5 - 5.1 mmol/L 3.9 3.8 3.8  Chloride 98 - 111 mmol/L 107 106 107  CO2 22 - 32 mmol/L 24 26 26   Calcium 8.9 - 10.3 mg/dL 9.0 9.1 9.2  Total Protein 6.5 - 8.1 g/dL 7.2 6.7 7.0  Total Bilirubin 0.3 - 1.2 mg/dL 0.4 0.4 0.4  Alkaline  Phos 38 - 126 U/L 114 128(H) 144(H)  AST 15 - 41 U/L 27 23 23   ALT 0 - 44 U/L 27 25 30    . Lab Results  Component Value Date   LDH 249 (H) 07/06/2020      RADIOGRAPHIC STUDIES: I have personally reviewed the radiological images as listed and agreed with the findings in the report. CT CHEST ABDOMEN PELVIS W CONTRAST  Result Date: 07/06/2020 CLINICAL DATA:  Hematologic malignancy in a 60 year old female, history of large B-cell lymphoma.  EXAM: CT CHEST, ABDOMEN, AND PELVIS WITH CONTRAST TECHNIQUE: Multidetector CT imaging of the chest, abdomen and pelvis was performed following the standard protocol during bolus administration of intravenous contrast. CONTRAST:  123m OMNIPAQUE IOHEXOL 300 MG/ML  SOLN COMPARISON:  May 23, 2019 and October 29, 2018 as well as PET scan from February 14, 2019 FINDINGS: CT CHEST FINDINGS Cardiovascular: RIGHT-sided Port-A-Cath terminates at the caval to atrial junction. Heart size normal without pericardial effusion. Aorta mildly dilated 3 cm caliber. Central pulmonary vessels are unremarkable on venous phase. Mediastinum/Nodes: Esophagus mildly patulous. Thoracic inlet structures are normal. No axillary lymphadenopathy. No mediastinal lymphadenopathy. No hilar lymphadenopathy. Mild fullness of subcarinal nodal tissue, 7 mm short axis previously 11 mm with subtle calcification along with small calcified RIGHT paratracheal lymph nodes. Lungs/Pleura: Lungs are clear.  Airways are patent. Musculoskeletal: No acute musculoskeletal finding related to the bony thorax. No destructive bone process. See below for full musculoskeletal details. CT ABDOMEN PELVIS FINDINGS Hepatobiliary: Hepatic steatosis. No focal, suspicious hepatic lesion. No pericholecystic stranding. No biliary duct dilation. Portal vein is patent. Pancreas: Normal, without mass, inflammation or ductal dilatation. Spleen: Normal size spleen without focal lesion. Adrenals/Urinary Tract: Adrenal glands are normal.  Symmetric renal enhancement. Urinary bladder with smooth contours. Under distended with limited assessment, grossly unremarkable. Stomach/Bowel: No acute gastrointestinal process. Stool fills much of the colon. Central mesenteric stranding is similar to prior imaging. Lymph nodes that show calcification currently previously subtly calcified now more pronounced calcification with interval decrease in size, for instance on image 65 of series 2 a central mesenteric lymph node measuring 4 mm previously 9 mm short axis. Other lymph nodes following this same pattern within the mesentery of the small bowel. Vascular/Lymphatic: Patent abdominal vasculature. Calcified and noncalcified atheromatous plaque of the abdominal aorta. There is no gastrohepatic or hepatoduodenal ligament lymphadenopathy. No retroperitoneal or mesenteric lymphadenopathy. Stranding in the retroperitoneum very similar to what is seen in the small bowel mesentery compatible with treated disease. Largest lymph node showing subtle calcification, 6 mm along the LEFT periaortic chain previously 7 mm. Smooth contour the IVC. No pelvic sidewall lymphadenopathy. Reproductive: Unremarkable appearance of uterus and adnexa. Other: Fat containing umbilical hernia.  No ascites. Musculoskeletal: Spinal degenerative changes. No acute or destructive bone process. Subtle sclerosis at L2 is unchanged since January of 2020 IMPRESSION: 1. No evidence of recurrent disease in the chest, abdomen or pelvis. 2. Post treatment changes in the chest and abdomen as described. 3. Subtle sclerosis at L2 unchanged since January of 2020, may also be indicative of treated disease and did not show uptake on prior PET imaging, attention on follow-up. 4. Hepatic steatosis. 5. Aortic atherosclerosis. Aortic Atherosclerosis (ICD10-I70.0). Electronically Signed   By: GZetta BillsM.D.   On: 07/06/2020 15:10    ASSESSMENT & PLAN:   1. Stage IV DLBCL, GCB subtype;double  hitlymphoma -S/p 1 cycle of R-CHOP and 5 cycles of da EPOCH-R and IT MTX x 4 for CNS prophylaxis -FISH  on 01/03/2019 and were positive for Myc and BCL2 rearrangement  02/14/2019 PET Scan Whole Body ([id number] revealed "1. Findings favor complete metabolic response. No residual hypermetabolism within the lymph nodes of the neck, chest, abdomen or pelvis, which have all decreased in size in the interval. 2. Nonspecific new diffuse skeletal hypermetabolism and new mild splenic hypermetabolism, favor reactive state of the marrow and reticuloendothelial system. Spleen is normal size. Continued surveillance with CT or PET-CT advised. 3. Small layering left pleural effusion, decreased. 4.  Aortic Atherosclerosis (ICD10-I70.0)."  -  Completed 5 cycles of R-EPOCH on 04/04/2019, 6 total cycles of chemotherapy (1st cycle was R-CHOP) -Completed 4 cycles of IT METHOTREXATE on 04/25/2019  05/23/2019 PET/CT scan ([id number]) revealed "Small calcified jejunal mesentery nodes, compatible with treated lymphoma. Deauville category 2. No findings suspicious for active lymphoma. Prior splenic and osseous hypermetabolism is no longer evident."  PLAN:  -Discussed pt labwork, 07/06/2020; blood counts and chemistries all normal. LDH elevated.  -Discussed pt CT Chest/Abd/Pel w contrast ([id number]) on 07/06/2020; no evidence of disease. No incidental findings for elevated LDH either. -Advised pt she is not hemolysing nor blood counts show signs for elevated LDH. Not a concern at this time. -Advised pt she is now over a year out from completion of treatment. -Advised pt we can remove Port at this time. The pt desires to keep it and not remove at this time. -No clinical or lab evidence of DLBCL recurrence at this time. No indication for treatment. Will continue watchful observation. -Will see back in 3 months with labs.  2. 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 Normocytic  anemia Resolved hgb 12.7 today.   FOLLOW UP: RTC with Dr Irene Limbo with portflush and labs in 3 months  The total time spent in the appointment was 20 minutes and more than 50% was on counseling and direct patient cares.  All of the patient's questions were answered with apparent satisfaction. The patient knows to call the clinic with any problems, questions or concerns.   Sullivan Lone MD [place] AAHIVMS [facility] Hematology/Oncology Physician [facility]  (Office):       [phone] (Work cell):  [phone] (Fax):           [phone]  07/13/2020 10:16 AM  I, Reinaldo Raddle, am acting as scribe for Dr. Sullivan Lone, MD.    .I have reviewed the above documentation for accuracy and completeness, and I agree with the above. [name] MD

## 2020-07-13 ENCOUNTER — Inpatient Hospital Stay: Payer: BC Managed Care – PPO | Admitting: Hematology

## 2020-07-13 ENCOUNTER — Telehealth: Payer: Self-pay | Admitting: Hematology

## 2020-07-13 ENCOUNTER — Other Ambulatory Visit: Payer: Self-pay

## 2020-07-13 VITALS — BP 139/82 | HR 67 | Temp 97.7°F | Resp 15 | Ht 60.0 in | Wt 161.6 lb

## 2020-07-13 DIAGNOSIS — C83398 Diffuse large b-cell lymphoma of other extranodal and solid organ sites: Secondary | ICD-10-CM

## 2020-07-13 DIAGNOSIS — C8339 Diffuse large B-cell lymphoma, extranodal and solid organ sites: Secondary | ICD-10-CM

## 2020-07-13 DIAGNOSIS — Z95828 Presence of other vascular implants and grafts: Secondary | ICD-10-CM | POA: Diagnosis not present

## 2020-07-13 NOTE — Telephone Encounter (Signed)
Scheduled appointments per 2/25 los. Spoke to patient who is aware of appointments dates and times.

## 2020-07-18 ENCOUNTER — Telehealth: Payer: Self-pay

## 2020-07-18 NOTE — Telephone Encounter (Signed)
Health Coaching 3  interpreter- Rudene Anda, [employer]    Goals- Patient states that her diet has been up and down. She states that on some days she eats well and on other days she eats whatever she wants even if it is not a healthy option. Patient wants to start eating healthier and exercising.   New goal- Patient has not been taking blood pressure or cholesterol medication since she ran out of the first month's supply. Patient stated that she did not know that she was supposed to continue taking the medication after she ran out. Informed patient that she has 11 refills remaining on both medications. Encouraged patient to get prescriptions refilled and to continue taking them unless the doctor tells her otherwise. Patient voiced understanding and said that she will get her daughter to help her.   Barrier to reaching goal- Patient states that on some days she is tired and doesn't feel like exercising.    Strategies to overcome- Encouraged patient to start off with a small goal of 10 minutes a day of walking and to increase the amount of time that she spends walking over time as she builds up more endurance.    Navigation:  Patient is aware of  a follow up session. Patient is scheduled for follow-up visit on Wednesday, August 22, 2020 @ 9:30 am.   Time- 20 minutes

## 2020-07-20 MED FILL — AMLODIPINE BESYLATE 5 MG TA: 5 | 30 days supply | Qty: 30 | Fill #1

## 2020-07-20 MED FILL — ATORVASTATIN CALCIUM 20 MG: 20 | 30 days supply | Qty: 30 | Fill #1

## 2020-08-22 ENCOUNTER — Other Ambulatory Visit (HOSPITAL_COMMUNITY): Payer: Self-pay

## 2020-08-22 ENCOUNTER — Other Ambulatory Visit: Payer: Self-pay

## 2020-08-22 ENCOUNTER — Inpatient Hospital Stay: Payer: BC Managed Care – PPO | Attending: Obstetrics and Gynecology | Admitting: *Deleted

## 2020-08-22 VITALS — BP 132/80 | Ht <= 58 in | Wt 160.8 lb

## 2020-08-22 DIAGNOSIS — Z0000 Encounter for general adult medical examination without abnormal findings: Secondary | ICD-10-CM

## 2020-08-22 NOTE — Progress Notes (Signed)
[facility] follow up   Interpreter: Rudene Anda, [employer]  Clinical Measurement:   Vitals:   08/22/20 0924  BP: 122/82      Medical History:  Patient states that she has high cholesterol, has high blood pressure and she does not have diabetes.  Medications:  Patient states that she does take medication to lower cholesterol and blood pressure. Patient does not take medication to lower blood sugar. Patient does not take an aspirin a day to help prevent a heart attack or stroke. During the past 7 days patient has taken prescribed medication to lower blood pressure on 7 days.   Blood pressure, self measurement: Patient states that she does measure blood pressure from home. She checks her blood pressure daily. She shares her readings with a health care provider: no.   Nutrition: Patient states that on average she eats 1 cups of fruit and 0 cups of vegetables per day. Patient states that she does not eat fish at least 2 times per week. Patient eats about half servings of whole grains. Patient drinks less than 36 ounces of beverages with added sugar weekly: yes. Patient is currently watching sodium or salt intake: yes. In the past 7 days patient has had 0 drinks containing alcohol. On average patient drinks 0 drinks containing alcohol per day.      Physical activity:  Patient states that she gets 225 minutes of moderate and 0 minutes of vigorous physical activity each week.  Smoking status:  Patient states that she has has never smoked .   Quality of life:  Over the past 2 weeks patient states that she had little interest or pleasure in doing things: not at all. She has been feeling down, depressed or hopeless:not at all.    Risk reduction and counseling:   Health Coaching: Spoke with patient about the daily recommendation for fruits and vegetables. Patient states that on some days she eats 2 servings of fruit but usually eats at least 1 serving a day. Patient consumes vegetables but not every  day. Encouraged patient to try and consume at least a serving of vegetables daily. Patient eats fish but not often. Gave examples of heart healthy fish that she can add into diet. Patient likes brown rice, oatmeal and cereal but does not consume them daily. Encouraged patient to continue adding whole grains in diet. Patient enjoys walking and walks daily when it is nice out for at least 45 minutes. Encouraged patient to continue with daily exercise.    Navigation: This was the  follow up session for this patient, I will check up on her progress in the coming months.  Time: 30 minutes

## 2020-08-24 ENCOUNTER — Other Ambulatory Visit (HOSPITAL_COMMUNITY): Payer: Self-pay

## 2020-10-11 ENCOUNTER — Other Ambulatory Visit: Payer: Self-pay | Admitting: Gastroenterology

## 2020-10-11 NOTE — Progress Notes (Signed)
HEMATOLOGY/ONCOLOGY CLINIC NOTE  Date of Service: 10/12/2020  Patient Care Team: [name], MD as PCP - General (Family Medicine)  CHIEF COMPLAINTS/PURPOSE OF CONSULTATION:  DLBCL (diffuse large B cell lymphoma)   HISTORY OF PRESENTING ILLNESS:  Denise Walls is a wonderful 60 y.o. female with a past medical history significant for anxiety, Crohn's disease, hypertension, hyperlipidemia, migraine headaches.  The patient's history is obtained through a Spanish interpreter via video, [place].  The patient reports that she has history of Crohn's disease, for which she has been taking mesalamine with very good control of her symptoms. She was previously followed by Dr. Kathy Breach gastroenterology and to transition her care to a different gastroenterologist after Dr.Guptamoved to [facility]. Her new gastroenterologist stopped her mesalamine some time in 2019,and soon after that, she began to develop new onset left lower quadrant discomfort/pain,nonradiating, dull, intermittent, mild to moderate in intensity, exacerbated by eating and improved with defecation. She underwent colonoscopy, which was unremarkable, and was recommended to modify her diet without any improvement. She then re-established her care with Dr. Etter Sjogren ordered CT abdomen/pelvis that showed diffuse abdominal lymphadenopathy. PET showed diffuse lymph node involvement in the neck, chest, abdomen and the pelvis,and patient was referred to oncology for further evaluation.  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 The patient initially received 1 cycle of R-CHOP which she tolerated fairly well with exception of hyponatremia and neutropenia.  Prior to her second planned cycle of chemotherapy, she was found to be positive for Myc and BCL-2 rearrangement.  She is now receiving  R-EPOCH and is here for cycle #2 of his  chemotherapy.  Today, the patient reports that she is feeling well overall.  She reports a very good appetite and has been eating a lot at home.  She reports that her breathing is stable and maybe even slightly improved.  Had an episode of dizziness and vomiting and was seen in the ER last week. Symptoms have now resolved. Today, she denies any dizziness, headaches, chest discomfort, cough.  Denies abdominal pain, nausea, vomiting, constipation, diarrhea.  Denies bleeding.  The patient is here for admission for cycle #2 of R-EPOCH.  INTERVAL HISTORY:   Denise Walls is a wonderful 60 y.o. female who is here for evaluation and management of DLBCL. We are joined today by a Patent attorney. The patient's last visit with [place] was on 07/13/2020. The pt reports that she is doing well overall.  The pt reports that she has no new symptoms or concerns. She notes some days are better than others.  Lab results today 10/12/2020 of CBC w/diff and CMP is as follows: all values are WNL. 10/12/2020 LDH of 245.  On review of systems, pt reports seasonal allergies and intermittent cough and denies new lumps/bumps, fevers, chills, night sweats, leg swelling, abdominal pain, and any other symptoms.  MEDICAL HISTORY:  Past Medical History:  Diagnosis Date  . Acute cystitis   . Anxiety   . Arthritis   . Cancer ([place])   . Crohn disease ([place])    dx 2004, history of small bowel obstruction 02/2007 treated conservatively  . History of colon polyps   . History of shingles   . HTN (hypertension)   . Hypercholesterolemia   . Insomnia   . Migraine   . Uterine fibroid     SURGICAL HISTORY: Past Surgical History:  Procedure Laterality Date  . COLONOSCOPY  09/11/2014  Small internal hemorrhoids. Otherwise normal colonoscopy to terminal ileum  . COLONOSCOPY  10/02/2017  . ESOPHAGOGASTRODUODENOSCOPY  04/05/2007   Mild gastritis. Otherwise, normal esophagogastroduodenoscopy  . IR IMAGING GUIDED PORT  INSERTION  11/29/2018  . LYMPH NODE BIOPSY Left 12/09/2018   Procedure: LEFT DEEP CERVICAL LYMPH NODE BIOPSY;  Surgeon: Fanny Skates, MD;  Location: [facility];  Service: General;  Laterality: Left;  . TUBAL LIGATION      SOCIAL HISTORY: Social History   Socioeconomic History  . Marital status: Married    Spouse name: Not on file  . Number of children: Not on file  . Years of education: Not on file  . Highest education level: 6th grade  Occupational History  . Not on file  Tobacco Use  . Smoking status: Never Smoker  . Smokeless tobacco: Never Used  Vaping Use  . Vaping Use: Never used  Substance and Sexual Activity  . Alcohol use: Never  . Drug use: Never  . Sexual activity: Not Currently    Birth control/protection: Post-menopausal  Other Topics Concern  . Not on file  Social History Narrative  . Not on file   Social Determinants of Health   Financial Resource Strain: Not on file  Food Insecurity: Not on file  Transportation Needs: No Transportation Needs  . Lack of Transportation (Medical): No  . Lack of Transportation (Non-Medical): No  Physical Activity: Not on file  Stress: Not on file  Social Connections: Not on file  Intimate Partner Violence: Not on file    FAMILY HISTORY: Family History  Problem Relation Age of Onset  . Heart disease Father   . Arthritis Sister   . Heart disease Sister   . Colon cancer Neg Hx     ALLERGIES:  is allergic to nsaids.  MEDICATIONS:  Current Outpatient Medications  Medication Sig Dispense Refill  . acetaminophen (TYLENOL) 325 MG tablet Take 325-650 mg by mouth every 6 (six) hours as needed for mild pain.    Marland Kitchen amLODipine (NORVASC) 5 MG tablet TAKE 1 TABLET (5 MG TOTAL) BY MOUTH DAILY. 30 tablet 11  . atorvastatin (LIPITOR) 20 MG tablet TAKE 1 TABLET (20 MG TOTAL) BY MOUTH DAILY. 30 tablet 11  . B Complex Vitamins (B COMPLEX PO) Take 1 tablet by mouth daily. (Patient not taking: No sig reported)    . calcium carbonate  (TUMS - DOSED IN MG ELEMENTAL CALCIUM) 500 MG chewable tablet Chew 2 tablets by mouth daily as needed for indigestion or heartburn. (Patient not taking: No sig reported)    . cholecalciferol (VITAMIN D3) 25 MCG (1000 UT) tablet Take 1 tablet (1,000 Units total) by mouth daily. 30 tablet 3  . lidocaine-prilocaine (EMLA) cream Apply to affected area once 30 g 3  . mesalamine (LIALDA) 1.2 g EC tablet Take 2 tablets (2.4 g total) by mouth daily. Please call [phone] to schedule an office visit for more refills. 60 tablet 2  . ondansetron (ZOFRAN) 8 MG tablet Take 1 tablet (8 mg total) by mouth 2 (two) times daily as needed for refractory nausea / vomiting. Start on day 3 after cyclophosphamide chemotherapy. (Patient not taking: No sig reported) 30 tablet 1  . polyethylene glycol (MIRALAX / GLYCOLAX) 17 g packet Take 17 g by mouth daily as needed for mild constipation. (Patient not taking: No sig reported)    . Prenatal Vit-Fe Fumarate-FA (MULTIVITAMIN-PRENATAL) 27-0.8 MG TABS tablet Take 1 tablet by mouth daily at 12 noon.  (Patient not taking: Reported on 08/22/2020)  No current facility-administered medications for this visit.    REVIEW OF SYSTEMS:   10 Point review of Systems was done is negative except as noted above.  PHYSICAL EXAMINATION: ECOG PERFORMANCE STATUS: 2 - Symptomatic, <50% confined to bed  . Vitals:   10/12/20 1146  BP: (!) 122/91  Pulse: 61  Resp: 18  Temp: 97.7 F (36.5 C)  SpO2: 100%   Filed Weights   10/12/20 1146  Weight: 163 lb 3.2 oz (74 kg)   .Body mass index is 34.11 kg/m.   NAD. GENERAL:alert, in no acute distress and comfortable SKIN: no acute rashes, no significant lesions EYES: conjunctiva are pink and non-injected, sclera anicteric OROPHARYNX: MMM, no exudates, no oropharyngeal erythema or ulceration NECK: supple, no JVD LYMPH:  no palpable lymphadenopathy in the cervical, axillary or inguinal regions LUNGS: clear to auscultation b/l with  normal respiratory effort HEART: regular rate & rhythm ABDOMEN:  normoactive bowel sounds , non tender, not distended. Extremity: no pedal edema PSYCH: alert & oriented x 3 with fluent speech NEURO: no focal motor/sensory deficits  LABORATORY DATA:  I have reviewed the data as listed  . CBC Latest Ref Rng & Units 10/12/2020 07/06/2020 04/13/2020  WBC 4.0 - 10.5 K/uL 4.8 5.4 4.6  Hemoglobin 12.0 - 15.0 g/dL 12.5 13.0 12.7  Hematocrit 36.0 - 46.0 % 38.1 40.0 40.4  Platelets 150 - 400 K/uL 231 224 221    . CMP Latest Ref Rng & Units 10/12/2020 07/06/2020 04/13/2020  Glucose 70 - 99 mg/dL 82 93 89  BUN 6 - 20 mg/dL 15 15 17   Creatinine 0.44 - 1.00 mg/dL 0.80 0.83 0.81  Sodium 135 - 145 mmol/L 143 138 140  Potassium 3.5 - 5.1 mmol/L 4.0 3.9 3.8  Chloride 98 - 111 mmol/L 107 107 106  CO2 22 - 32 mmol/L 26 24 26   Calcium 8.9 - 10.3 mg/dL 9.0 9.0 9.1  Total Protein 6.5 - 8.1 g/dL 7.0 7.2 6.7  Total Bilirubin 0.3 - 1.2 mg/dL 0.4 0.4 0.4  Alkaline Phos 38 - 126 U/L 123 114 128(H)  AST 15 - 41 U/L 26 27 23   ALT 0 - 44 U/L 24 27 25    . Lab Results  Component Value Date   LDH 245 (H) 10/12/2020      RADIOGRAPHIC STUDIES: I have personally reviewed the radiological images as listed and agreed with the findings in the report. No results found.  ASSESSMENT & PLAN:   1. Stage IV DLBCL, GCB subtype;double hitlymphoma -S/p 1 cycle of R-CHOP and 5 cycles of da EPOCH-R and IT MTX x 4 for CNS prophylaxis -FISH  on 01/03/2019 and were positive for Myc and BCL2 rearrangement  02/14/2019 PET Scan Whole Body ([id number]) revealed "1. Findings favor complete metabolic response. No residual hypermetabolism within the lymph nodes of the neck, chest, abdomen or pelvis, which have all decreased in size in the interval. 2. Nonspecific new diffuse skeletal hypermetabolism and new mild splenic hypermetabolism, favor reactive state of the marrow and reticuloendothelial system. Spleen is normal size.  Continued surveillance with CT or PET-CT advised. 3. Small layering left pleural effusion, decreased. 4.  Aortic Atherosclerosis (ICD10-I70.0)."  -Completed 5 cycles of R-EPOCH on 04/04/2019, 6 total cycles of chemotherapy (1st cycle was R-CHOP) -Completed 4 cycles of IT METHOTREXATE on 04/25/2019  05/23/2019 PET/CT scan ([id number]) revealed "Small calcified jejunal mesentery nodes, compatible with treated lymphoma. Deauville category 2. No findings suspicious for active lymphoma. Prior splenic and osseous hypermetabolism is no longer evident."  PLAN:  -Discussed pt labwork, 10/12/2020; blood counts and chemistries completely normal, LDH stable. -Advised pt she is now nearly 1.5 years out from treatment completion. -Recommended pt receive the second COVID booster shot as recently approved. The pt is okay to get this now as her first booster was 01/27/2020. The pt has previously had [place]. -Recommended pt walk at least 30 min daily. -Advised pt the maximum we can keep her Port is two years. The pt wishes to keep it for now. -No clinical or lab evidence of DLBCL recurrence at this time. No indication for treatment. Will continue watchful observation. -Will see back in 3 months with labs.   2. 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  3. Normocytic anemia Resolved.   FOLLOW UP: RTC with Dr Irene Limbo with portflush and labs in 3 months    The total time spent in the appointment was 20 minutes and more than 50% was on counseling and direct patient cares.  All of the patient's questions were answered with apparent satisfaction. The patient knows to call the clinic with any problems, questions or concerns.   Sullivan Lone MD [place] AAHIVMS [facility] Hematology/Oncology Physician [facility]  (Office):       [phone] (Work cell):  [phone] (Fax):           [phone]  10/12/2020 12:33 PM  I, Reinaldo Raddle, am acting as scribe for Dr.  Sullivan Lone, MD.   .I have reviewed the above documentation for accuracy and completeness, and I agree with the above. [name] MD

## 2020-10-12 ENCOUNTER — Inpatient Hospital Stay: Payer: BC Managed Care – PPO | Attending: Hematology | Admitting: Hematology

## 2020-10-12 ENCOUNTER — Other Ambulatory Visit: Payer: Self-pay

## 2020-10-12 ENCOUNTER — Other Ambulatory Visit: Payer: BC Managed Care – PPO

## 2020-10-12 ENCOUNTER — Inpatient Hospital Stay: Payer: BC Managed Care – PPO

## 2020-10-12 ENCOUNTER — Telehealth: Payer: Self-pay | Admitting: Hematology

## 2020-10-12 VITALS — BP 122/91 | HR 61 | Temp 97.7°F | Resp 18 | Ht <= 58 in | Wt 163.2 lb

## 2020-10-12 DIAGNOSIS — C8339 Diffuse large B-cell lymphoma, extranodal and solid organ sites: Secondary | ICD-10-CM

## 2020-10-12 DIAGNOSIS — C8330 Diffuse large B-cell lymphoma, unspecified site: Secondary | ICD-10-CM

## 2020-10-12 DIAGNOSIS — Z95828 Presence of other vascular implants and grafts: Secondary | ICD-10-CM

## 2020-10-12 LAB — CBC WITH DIFFERENTIAL (CANCER CENTER ONLY)
Abs Immature Granulocytes: 0 10*3/uL (ref 0.00–0.07)
Basophils Absolute: 0 10*3/uL (ref 0.0–0.1)
Basophils Relative: 0 %
Eosinophils Absolute: 0.1 10*3/uL (ref 0.0–0.5)
Eosinophils Relative: 2 %
HCT: 38.1 % (ref 36.0–46.0)
Hemoglobin: 12.5 g/dL (ref 12.0–15.0)
Immature Granulocytes: 0 %
Lymphocytes Relative: 43 %
Lymphs Abs: 2 10*3/uL (ref 0.7–4.0)
MCH: 29.1 pg (ref 26.0–34.0)
MCHC: 32.8 g/dL (ref 30.0–36.0)
MCV: 88.6 fL (ref 80.0–100.0)
Monocytes Absolute: 0.4 10*3/uL (ref 0.1–1.0)
Monocytes Relative: 8 %
Neutro Abs: 2.3 10*3/uL (ref 1.7–7.7)
Neutrophils Relative %: 47 %
Platelet Count: 231 10*3/uL (ref 150–400)
RBC: 4.3 MIL/uL (ref 3.87–5.11)
RDW: 13.7 % (ref 11.5–15.5)
WBC Count: 4.8 10*3/uL (ref 4.0–10.5)
nRBC: 0 % (ref 0.0–0.2)

## 2020-10-12 LAB — CMP (CANCER CENTER ONLY)
ALT: 24 U/L (ref 0–44)
AST: 26 U/L (ref 15–41)
Albumin: 3.8 g/dL (ref 3.5–5.0)
Alkaline Phosphatase: 123 U/L (ref 38–126)
Anion gap: 10 (ref 5–15)
BUN: 15 mg/dL (ref 6–20)
CO2: 26 mmol/L (ref 22–32)
Calcium: 9 mg/dL (ref 8.9–10.3)
Chloride: 107 mmol/L (ref 98–111)
Creatinine: 0.8 mg/dL (ref 0.44–1.00)
GFR, Estimated: >60 mL/min (ref >60)
Glucose, Bld: 82 mg/dL (ref 70–99)
Potassium: 4 mmol/L (ref 3.5–5.1)
Sodium: 143 mmol/L (ref 135–145)
Total Bilirubin: 0.4 mg/dL (ref 0.3–1.2)
Total Protein: 7 g/dL (ref 6.5–8.1)

## 2020-10-12 LAB — LACTATE DEHYDROGENASE: LDH: 245 U/L — ABNORMAL HIGH (ref 98–192)

## 2020-10-12 MED ORDER — HEPARIN SOD (PORK) LOCK FLUSH 100 UNIT/ML IV SOLN
500.0000 [IU] | Freq: Once | INTRAVENOUS | Status: AC
Start: 2020-10-12 — End: 2020-10-12
  Administered 2020-10-12: 500 [IU]
  Filled 2020-10-12: qty 5

## 2020-10-12 MED ORDER — SODIUM CHLORIDE 0.9% FLUSH
10.0000 mL | Freq: Once | INTRAVENOUS | Status: AC
  Administered 2020-10-12: 10 mL
  Filled 2020-10-12: qty 10

## 2020-10-12 NOTE — Telephone Encounter (Signed)
Scheduled per los. Gave avs and calendar  

## 2020-10-18 ENCOUNTER — Encounter: Payer: Self-pay | Admitting: Hematology

## 2021-01-11 ENCOUNTER — Other Ambulatory Visit: Payer: BC Managed Care – PPO

## 2021-01-11 ENCOUNTER — Ambulatory Visit: Payer: BC Managed Care – PPO | Admitting: Hematology

## 2021-01-16 ENCOUNTER — Other Ambulatory Visit: Payer: Self-pay | Admitting: Gastroenterology

## 2021-02-05 ENCOUNTER — Encounter: Payer: Self-pay | Admitting: Hematology

## 2021-02-05 ENCOUNTER — Inpatient Hospital Stay: Payer: BC Managed Care – PPO | Admitting: Hematology

## 2021-02-05 ENCOUNTER — Inpatient Hospital Stay: Payer: BC Managed Care – PPO | Attending: Hematology

## 2021-02-05 ENCOUNTER — Other Ambulatory Visit: Payer: Self-pay

## 2021-02-05 VITALS — BP 167/82 | HR 65 | Temp 98.0°F | Resp 17 | Ht <= 58 in | Wt 159.5 lb

## 2021-02-05 DIAGNOSIS — C8339 Diffuse large B-cell lymphoma, extranodal and solid organ sites: Secondary | ICD-10-CM

## 2021-02-05 DIAGNOSIS — C8330 Diffuse large B-cell lymphoma, unspecified site: Secondary | ICD-10-CM | POA: Diagnosis present

## 2021-02-05 DIAGNOSIS — Z95828 Presence of other vascular implants and grafts: Secondary | ICD-10-CM

## 2021-02-05 LAB — CBC WITH DIFFERENTIAL/PLATELET
Abs Immature Granulocytes: 0 10*3/uL (ref 0.00–0.07)
Basophils Absolute: 0 10*3/uL (ref 0.0–0.1)
Basophils Relative: 1 %
Eosinophils Absolute: 0.1 10*3/uL (ref 0.0–0.5)
Eosinophils Relative: 1 %
HCT: 39.3 % (ref 36.0–46.0)
Hemoglobin: 13 g/dL (ref 12.0–15.0)
Immature Granulocytes: 0 %
Lymphocytes Relative: 43 %
Lymphs Abs: 2.3 10*3/uL (ref 0.7–4.0)
MCH: 29.5 pg (ref 26.0–34.0)
MCHC: 33.1 g/dL (ref 30.0–36.0)
MCV: 89.3 fL (ref 80.0–100.0)
Monocytes Absolute: 0.4 10*3/uL (ref 0.1–1.0)
Monocytes Relative: 7 %
Neutro Abs: 2.6 10*3/uL (ref 1.7–7.7)
Neutrophils Relative %: 48 %
Platelets: 231 10*3/uL (ref 150–400)
RBC: 4.4 MIL/uL (ref 3.87–5.11)
RDW: 13.9 % (ref 11.5–15.5)
WBC: 5.4 10*3/uL (ref 4.0–10.5)
nRBC: 0 % (ref 0.0–0.2)

## 2021-02-05 LAB — CMP (CANCER CENTER ONLY)
ALT: 22 U/L (ref 0–44)
AST: 22 U/L (ref 15–41)
Albumin: 4.1 g/dL (ref 3.5–5.0)
Alkaline Phosphatase: 103 U/L (ref 38–126)
Anion gap: 9 (ref 5–15)
BUN: 14 mg/dL (ref 8–23)
CO2: 25 mmol/L (ref 22–32)
Calcium: 9.1 mg/dL (ref 8.9–10.3)
Chloride: 105 mmol/L (ref 98–111)
Creatinine: 0.81 mg/dL (ref 0.44–1.00)
GFR, Estimated: >60 mL/min (ref >60)
Glucose, Bld: 86 mg/dL (ref 70–99)
Potassium: 4.1 mmol/L (ref 3.5–5.1)
Sodium: 139 mmol/L (ref 135–145)
Total Bilirubin: 0.6 mg/dL (ref 0.3–1.2)
Total Protein: 7.3 g/dL (ref 6.5–8.1)

## 2021-02-05 LAB — LACTATE DEHYDROGENASE: LDH: 254 U/L — ABNORMAL HIGH (ref 98–192)

## 2021-02-05 MED ORDER — HEPARIN SOD (PORK) LOCK FLUSH 100 UNIT/ML IV SOLN
500.0000 [IU] | Freq: Once | INTRAVENOUS | Status: AC
  Administered 2021-02-05: 500 [IU]

## 2021-02-05 MED ORDER — SODIUM CHLORIDE 0.9% FLUSH
10.0000 mL | Freq: Once | INTRAVENOUS | Status: AC
  Administered 2021-02-05: 10 mL

## 2021-02-11 ENCOUNTER — Encounter: Payer: Self-pay | Admitting: Hematology

## 2021-02-11 NOTE — Progress Notes (Signed)
HEMATOLOGY/ONCOLOGY CLINIC NOTE  Date of Service:   Patient Care Team: [name], MD as PCP - General (Family Medicine)  CHIEF COMPLAINTS/PURPOSE OF CONSULTATION:  DLBCL (diffuse large B cell lymphoma)   HISTORY OF PRESENTING ILLNESS:  Denise Walls is a wonderful 60 y.o. female with a past medical history significant for anxiety, Crohn's disease, hypertension, hyperlipidemia, migraine headaches.  The patient's history is obtained through a Spanish interpreter via video, [place].  The patient reports that she has history of Crohn's disease, for which she has been taking mesalamine with very good control of her symptoms. She was previously followed by [name] of gastroenterology and to transition her care to a different gastroenterologist after [name] moved to [place].  Her new gastroenterologist stopped her mesalamine some time in 2019, and soon after that, she began to develop new onset left lower quadrant discomfort/pain, nonradiating, dull, intermittent, mild to moderate in intensity, exacerbated by eating and improved with defecation.  She underwent colonoscopy, which was unremarkable, and was recommended to modify her diet without any improvement.  She then re-established her care with [name], who ordered CT abdomen/pelvis that showed diffuse abdominal lymphadenopathy.  PET showed diffuse lymph node involvement in the neck, chest, abdomen and the pelvis, and patient was referred to oncology for further evaluation.  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 The patient initially received 1 cycle of R-CHOP  which she tolerated fairly well with exception of hyponatremia and neutropenia.  Prior to her second planned cycle of chemotherapy, she was found to be positive for Myc and BCL-2 rearrangement.  She is now receiving  R-EPOCH and is here for cycle #2 of his chemotherapy.    Today, the patient reports that she is feeling well overall.  She reports a very good appetite and has been eating a lot at home.  She reports that her breathing is stable and maybe even slightly improved.  Had an episode of dizziness and vomiting and was seen in the ER last week. Symptoms have now resolved. Today, she denies any dizziness, headaches, chest discomfort, cough.  Denies abdominal pain, nausea, vomiting, constipation, diarrhea.  Denies bleeding.  The patient is here for admission for cycle #2 of R-EPOCH.  INTERVAL HISTORY:   Denise Walls is a wonderful 60 y.o. female who is here for evaluation and management of DLBCL. We are joined today by a Patent attorney. The patient's last visit with [place] was on 10/12/2020. The pt reports that she is doing well overall.  The pt reports that she has no new symptoms or concerns. She notes she visited her home done in [place] and had a very good time spending some time with her family there. Notes that she was walking much more in [place] and felt healthier.  Lab results today 02/05/2021 CBC within normal limits, CMP unremarkable.  Patient notes no fevers no chills no night sweats no unexpected weight loss.  No new lumps or bumps.  No new fatigue.. Eating well and has no other acute new symptoms. In good spirits.  Discussed port removal and patient is now agreeable to this.  MEDICAL HISTORY:  Past Medical History:  Diagnosis Date   Acute cystitis    Anxiety    Arthritis    Cancer ([place])    Crohn disease ([place])    dx 2004, history of small bowel obstruction 02/2007 treated conservatively   History of  colon polyps    History of shingles    HTN (hypertension)    Hypercholesterolemia    Insomnia    Migraine    Uterine fibroid     SURGICAL HISTORY: Past Surgical History:  Procedure Laterality Date   COLONOSCOPY  09/11/2014   Small internal hemorrhoids. Otherwise normal colonoscopy to terminal ileum   COLONOSCOPY  10/02/2017    ESOPHAGOGASTRODUODENOSCOPY  04/05/2007   Mild gastritis. Otherwise, normal esophagogastroduodenoscopy   IR IMAGING GUIDED PORT INSERTION  11/29/2018   LYMPH NODE BIOPSY Left 12/09/2018   Procedure: LEFT DEEP CERVICAL LYMPH NODE BIOPSY;  Surgeon: Fanny Skates, MD;  Location: [facility];  Service: General;  Laterality: Left;   TUBAL LIGATION      SOCIAL HISTORY: Social History   Socioeconomic History   Marital status: Married    Spouse name: Not on file   Number of children: Not on file   Years of education: Not on file   Highest education level: 6th grade  Occupational History   Not on file  Tobacco Use   Smoking status: Never   Smokeless tobacco: Never  Vaping Use   Vaping Use: Never used  Substance and Sexual Activity   Alcohol use: Never   Drug use: Never   Sexual activity: Not Currently    Birth control/protection: Post-menopausal  Other Topics Concern   Not on file  Social History Narrative   Not on file   Social Determinants of Health   Financial Resource Strain: Not on file  Food Insecurity: Not on file  Transportation Needs: No Transportation Needs   Lack of Transportation (Medical): No   Lack of Transportation (Non-Medical): No  Physical Activity: Not on file  Stress: Not on file  Social Connections: Not on file  Intimate Partner Violence: Not on file    FAMILY HISTORY: Family History  Problem Relation Age of Onset   Heart disease Father    Arthritis Sister    Heart disease Sister    Colon cancer Neg Hx     ALLERGIES:  is allergic to nsaids.  MEDICATIONS:  Current Outpatient Medications  Medication Sig Dispense Refill   acetaminophen (TYLENOL) 325 MG tablet Take 325-650 mg by mouth every 6 (six) hours as needed for mild pain.     amLODipine (NORVASC) 5 MG tablet TAKE 1 TABLET (5 MG TOTAL) BY MOUTH DAILY. 30 tablet 11   atorvastatin (LIPITOR) 20 MG tablet TAKE 1 TABLET (20 MG TOTAL) BY MOUTH DAILY. 30 tablet 11   B Complex Vitamins (B COMPLEX PO)  Take 1 tablet by mouth daily. (Patient not taking: No sig reported)     calcium carbonate (TUMS - DOSED IN MG ELEMENTAL CALCIUM) 500 MG chewable tablet Chew 2 tablets by mouth daily as needed for indigestion or heartburn. (Patient not taking: No sig reported)     cholecalciferol (VITAMIN D3) 25 MCG (1000 UT) tablet Take 1 tablet (1,000 Units total) by mouth daily. 30 tablet 3   lidocaine-prilocaine (EMLA) cream Apply to affected area once 30 g 3   mesalamine (LIALDA) 1.2 g EC tablet Take 2 tablets (2.4 g total) by mouth daily. Please call [phone] to schedule an office visit for more refills. 60 tablet 2   ondansetron (ZOFRAN) 8 MG tablet Take 1 tablet (8 mg total) by mouth 2 (two) times daily as needed for refractory nausea / vomiting. Start on day 3 after cyclophosphamide chemotherapy. (Patient not taking: No sig reported) 30 tablet 1   polyethylene glycol (MIRALAX / GLYCOLAX)  17 g packet Take 17 g by mouth daily as needed for mild constipation. (Patient not taking: No sig reported)     Prenatal Vit-Fe Fumarate-FA (MULTIVITAMIN-PRENATAL) 27-0.8 MG TABS tablet Take 1 tablet by mouth daily at 12 noon.  (Patient not taking: Reported on 08/22/2020)     No current facility-administered medications for this visit.    REVIEW OF SYSTEMS:   .10 Point review of Systems was done is negative except as noted above.  PHYSICAL EXAMINATION: ECOG PERFORMANCE STATUS: 2 - Symptomatic, <50% confined to bed  . Vitals:   02/05/21 1009  BP: (!) 167/82  Pulse: 65  Resp: 17  Temp: 98 F (36.7 C)  SpO2: 99%   Filed Weights   02/05/21 1009  Weight: 159 lb 8 oz (72.3 kg)   .Body mass index is 33.34 kg/m.  NAD .Marland Kitchen GENERAL:alert, in no acute distress and comfortable SKIN: no acute rashes, no significant lesions EYES: conjunctiva are pink and non-injected, sclera anicteric OROPHARYNX: MMM, no exudates, no oropharyngeal erythema or ulceration NECK: supple, no JVD LYMPH:  no palpable lymphadenopathy in  the cervical, axillary or inguinal regions LUNGS: clear to auscultation b/l with normal respiratory effort HEART: regular rate & rhythm ABDOMEN:  normoactive bowel sounds , non tender, not distended. Extremity: no pedal edema PSYCH: alert & oriented x 3 with fluent speech NEURO: no focal motor/sensory deficits    LABORATORY DATA:  I have reviewed the data as listed  . CBC Latest Ref Rng & Units 02/05/2021 10/12/2020 07/06/2020  WBC 4.0 - 10.5 K/uL 5.4 4.8 5.4  Hemoglobin 12.0 - 15.0 g/dL 13.0 12.5 13.0  Hematocrit 36.0 - 46.0 % 39.3 38.1 40.0  Platelets 150 - 400 K/uL 231 231 224    . CMP Latest Ref Rng & Units 02/05/2021 10/12/2020 07/06/2020  Glucose 70 - 99 mg/dL 86 82 93  BUN 8 - 23 mg/dL 14 15 15   Creatinine 0.44 - 1.00 mg/dL 0.81 0.80 0.83  Sodium 135 - 145 mmol/L 139 143 138  Potassium 3.5 - 5.1 mmol/L 4.1 4.0 3.9  Chloride 98 - 111 mmol/L 105 107 107  CO2 22 - 32 mmol/L 25 26 24   Calcium 8.9 - 10.3 mg/dL 9.1 9.0 9.0  Total Protein 6.5 - 8.1 g/dL 7.3 7.0 7.2  Total Bilirubin 0.3 - 1.2 mg/dL 0.6 0.4 0.4  Alkaline Phos 38 - 126 U/L 103 123 114  AST 15 - 41 U/L 22 26 27   ALT 0 - 44 U/L 22 24 27    . Lab Results  Component Value Date   LDH 254 (H) 02/05/2021      RADIOGRAPHIC STUDIES: I have personally reviewed the radiological images as listed and agreed with the findings in the report. No results found.  ASSESSMENT & PLAN:   1. Stage IV DLBCL, GCB subtype; double hit lymphoma -S/p 1 cycle  of R-CHOP and 5 cycles of da EPOCH-R and IT MTX x 4 for CNS prophylaxis -FISH  on 01/03/2019 and were positive for Myc and BCL2 rearrangement  02/14/2019 PET Scan Whole Body ([id number]) revealed "1. Findings favor complete metabolic response. No residual hypermetabolism within the lymph nodes of the neck, chest, abdomen or pelvis, which have all decreased in size in the interval. 2. Nonspecific new diffuse skeletal hypermetabolism and new mild splenic hypermetabolism, favor  reactive state of the marrow and reticuloendothelial system. Spleen is normal size. Continued surveillance with CT or PET-CT advised. 3. Small layering left pleural effusion, decreased. 4.  Aortic Atherosclerosis (ICD10-I70.0)."  -  Completed 5 cycles of R-EPOCH on 04/04/2019, 6 total cycles of chemotherapy (1st cycle was R-CHOP) -Completed 4 cycles of IT METHOTREXATE on 04/25/2019  05/23/2019 PET/CT scan ([id number]) revealed "Small calcified jejunal mesentery nodes, compatible with treated lymphoma. Deauville category 2. No findings suspicious for active lymphoma. Prior splenic and osseous hypermetabolism is no longer evident."  PLAN:  -Discussed pt labwork, 02/05/2021; blood counts and chemistries completely normal, LDH stable. -Advised pt she is now nearly 21 months out from treatment completion. -Recommended pt receive the second COVID booster shot as recently approved. The pt is okay to get this now as her first booster was 01/27/2020. The pt has previously had [place]. -Recommended pt walk at least 30 min daily. -IR referral for Port-A-Cath removal -No clinical or lab evidence of DLBCL recurrence at this time. No indication for treatment. Will continue watchful observation. -Will see back in 4 months with labs.   2. 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   3. Normocytic anemia Resolved.    FOLLOW UP: IR for Port-A-Cath removal in 1 to 2 weeks Return to clinic with Dr. Irene Limbo with labs 4 months    The total time spent in the appointment was 20 minutes and more than 50% was on counseling and direct patient cares.  All of the patient's questions were answered with apparent satisfaction. The patient knows to call the clinic with any problems, questions or concerns.   Sullivan Lone MD [place] AAHIVMS [facility] Hematology/Oncology Physician [facility]  (Office):       [phone] (Work cell):  [phone] (Fax):            [phone]  02/11/2021 3:07 PM  I, Reinaldo Raddle, am acting as scribe for Dr. Sullivan Lone, MD.   .I have reviewed the above documentation for accuracy and completeness, and I agree with the above. [name] MD

## 2021-02-22 ENCOUNTER — Other Ambulatory Visit: Payer: Self-pay | Admitting: Radiology

## 2021-02-22 ENCOUNTER — Other Ambulatory Visit: Payer: Self-pay | Admitting: Gastroenterology

## 2021-02-25 ENCOUNTER — Other Ambulatory Visit: Payer: Self-pay

## 2021-02-25 ENCOUNTER — Ambulatory Visit (HOSPITAL_COMMUNITY)
Admission: RE | Admit: 2021-02-25 | Discharge: 2021-02-25 | Disposition: A | Discharge status: Home / Self Care | Payer: BC Managed Care – PPO | Source: Ambulatory Visit | Attending: Hematology | Admitting: Hematology

## 2021-02-25 ENCOUNTER — Encounter (HOSPITAL_COMMUNITY): Payer: Self-pay

## 2021-02-25 DIAGNOSIS — Z886 Allergy status to analgesic agent status: Secondary | ICD-10-CM | POA: Insufficient documentation

## 2021-02-25 DIAGNOSIS — Z452 Encounter for adjustment and management of vascular access device: Secondary | ICD-10-CM | POA: Diagnosis not present

## 2021-02-25 DIAGNOSIS — C8330 Diffuse large B-cell lymphoma, unspecified site: Secondary | ICD-10-CM

## 2021-02-25 DIAGNOSIS — Z79899 Other long term (current) drug therapy: Secondary | ICD-10-CM | POA: Diagnosis not present

## 2021-02-25 HISTORY — PX: IR REMOVAL TUN ACCESS W/ PORT W/O FL MOD SED: IMG2290

## 2021-02-25 MED ORDER — SODIUM CHLORIDE 0.9 % IV SOLN: INTRAVENOUS | Status: DC

## 2021-02-25 MED ORDER — FENTANYL CITRATE (PF) 100 MCG/2ML IJ SOLN
INTRAMUSCULAR | Status: AC
  Filled 2021-02-25: qty 2

## 2021-02-25 MED ORDER — FENTANYL CITRATE (PF) 100 MCG/2ML IJ SOLN
INTRAMUSCULAR | Status: DC | PRN
  Administered 2021-02-25: 50 ug via INTRAVENOUS

## 2021-02-25 MED ORDER — MIDAZOLAM HCL 2 MG/2ML IJ SOLN
INTRAMUSCULAR | Status: AC
  Filled 2021-02-25: qty 2

## 2021-02-25 MED ORDER — MIDAZOLAM HCL 2 MG/2ML IJ SOLN
INTRAMUSCULAR | Status: DC | PRN
  Administered 2021-02-25: 1 mg via INTRAVENOUS

## 2021-02-25 MED ORDER — LIDOCAINE HCL (PF) 1 % IJ SOLN
INTRAMUSCULAR | Status: DC | PRN
  Administered 2021-02-25: 10 mL via INTRADERMAL

## 2021-02-25 MED ORDER — LIDOCAINE HCL 1 % IJ SOLN
INTRAMUSCULAR | Status: AC
  Filled 2021-02-25: qty 20

## 2021-02-25 NOTE — H&P (Signed)
Chief Complaint: Patient was seen in consultation today for No chief complaint on file.  at the request of Denise Walls  Referring Physician(s): Denise Walls  Supervising Physician: Markus Daft  Patient Status: [facility] - Out-pt  History of Present Illness: Denise Walls is a 60 y.o. female with history of diffuse large B cell lymphoma.  She has completed treatment.  Past Medical History:  Diagnosis Date   Acute cystitis    Anxiety    Arthritis    Cancer ([place])    Crohn disease ([place])    dx 2004, history of small bowel obstruction 02/2007 treated conservatively   History of colon polyps    History of shingles    HTN (hypertension)    Hypercholesterolemia    Insomnia    Migraine    Uterine fibroid     Past Surgical History:  Procedure Laterality Date   COLONOSCOPY  09/11/2014   Small internal hemorrhoids. Otherwise normal colonoscopy to terminal ileum   COLONOSCOPY  10/02/2017   ESOPHAGOGASTRODUODENOSCOPY  04/05/2007   Mild gastritis. Otherwise, normal esophagogastroduodenoscopy   IR IMAGING GUIDED PORT INSERTION  11/29/2018   LYMPH NODE BIOPSY Left 12/09/2018   Procedure: LEFT DEEP CERVICAL LYMPH NODE BIOPSY;  Surgeon: Fanny Skates, MD;  Location: [facility];  Service: General;  Laterality: Left;   TUBAL LIGATION      Allergies: Nsaids  Medications: Prior to Admission medications   Medication Sig Start Date End Date Taking? Authorizing Provider  acetaminophen (TYLENOL) 325 MG tablet Take 325-650 mg by mouth every 6 (six) hours as needed for mild pain.   Yes [provider]  cholecalciferol (VITAMIN D3) 25 MCG (1000 UT) tablet Take 1 tablet (1,000 Units total) by mouth daily. 02/04/19  Yes Denise Genera, MD  mesalamine (LIALDA) 1.2 g EC tablet TAKE 2 TABLETS (2.4 G TOTAL) BY MOUTH DAILY. PLEASE CALL [phone] TO SCHEDULE AN OFFICE VISIT FOR MORE REFILLS. 02/22/21  Yes Jackquline Denmark, MD  amLODipine (NORVASC) 5 MG tablet TAKE 1  TABLET (5 MG TOTAL) BY MOUTH DAILY. 12/30/19 12/29/20  Katsadouros, Vasilios, MD  atorvastatin (LIPITOR) 20 MG tablet TAKE 1 TABLET (20 MG TOTAL) BY MOUTH DAILY. 12/30/19 12/29/20  Riesa Pope, MD  B Complex Vitamins (B COMPLEX PO) Take 1 tablet by mouth daily. Patient not taking: No sig reported    [provider]  calcium carbonate (TUMS - DOSED IN MG ELEMENTAL CALCIUM) 500 MG chewable tablet Chew 2 tablets by mouth daily as needed for indigestion or heartburn. Patient not taking: No sig reported    [provider]  lidocaine-prilocaine (EMLA) cream Apply to affected area once 12/14/18   Tish Men, MD  ondansetron (ZOFRAN) 8 MG tablet Take 1 tablet (8 mg total) by mouth 2 (two) times daily as needed for refractory nausea / vomiting. Start on day 3 after cyclophosphamide chemotherapy. Patient not taking: No sig reported 12/14/18   Tish Men, MD  polyethylene glycol (MIRALAX / GLYCOLAX) 17 g packet Take 17 g by mouth daily as needed for mild constipation. Patient not taking: No sig reported    [provider]  Prenatal Vit-Fe Fumarate-FA (MULTIVITAMIN-PRENATAL) 27-0.8 MG TABS tablet Take 1 tablet by mouth daily at 12 noon.  Patient not taking: Reported on 08/22/2020    [provider]     Family History  Problem Relation Age of Onset   Heart disease Father    Arthritis Sister    Heart disease Sister    Colon cancer Neg Hx  Social History   Socioeconomic History   Marital status: Married    Spouse name: Not on file   Number of children: Not on file   Years of education: Not on file   Highest education level: 6th grade  Occupational History   Not on file  Tobacco Use   Smoking status: Never   Smokeless tobacco: Never  Vaping Use   Vaping Use: Never used  Substance and Sexual Activity   Alcohol use: Never   Drug use: Never   Sexual activity: Not Currently    Birth control/protection: Post-menopausal  Other Topics Concern   Not on file   Social History Narrative   Not on file   Social Determinants of Health   Financial Resource Strain: Not on file  Food Insecurity: Not on file  Transportation Needs: No Transportation Needs   Lack of Transportation (Medical): No   Lack of Transportation (Non-Medical): No  Physical Activity: Not on file  Stress: Not on file  Social Connections: Not on file     Review of Systems: A 12 point ROS discussed and pertinent positives are indicated in the HPI above.  All other systems are negative.  Review of Systems  Constitutional: Negative.   HENT: Negative.    Respiratory: Negative.    Cardiovascular: Negative.   Gastrointestinal: Negative.   Genitourinary: Negative.   Skin: Negative.   Neurological: Negative.   Psychiatric/Behavioral: Negative.     Vital Signs: BP 130/72   Pulse 67   Temp 98 F (36.7 C) (Oral)   Resp 18   LMP  (LMP Unknown)   SpO2 98%   Physical Exam Vitals reviewed.  Constitutional:      Appearance: Normal appearance.  HENT:     Mouth/Throat:     Mouth: Mucous membranes are moist.     Pharynx: Oropharynx is clear.  Cardiovascular:     Rate and Rhythm: Normal rate and regular rhythm.     Pulses: Normal pulses.  Pulmonary:     Effort: Pulmonary effort is normal.     Breath sounds: Normal breath sounds.  Abdominal:     General: Abdomen is flat.     Palpations: Abdomen is soft.  Skin:    General: Skin is warm and dry.     Capillary Refill: Capillary refill takes 2 to 3 seconds.  Neurological:     Mental Status: She is alert and oriented to person, place, and time.  Psychiatric:        Mood and Affect: Mood normal.        Behavior: Behavior normal.    Imaging: No results found.  Labs:  CBC: Recent Labs    04/13/20 1027 07/06/20 1048 10/12/20 1117 02/05/21 0942  WBC 4.6 5.4 4.8 5.4  HGB 12.7 13.0 12.5 13.0  HCT 40.4 40.0 38.1 39.3  PLT 221 224 231 231    COAGS: No results for input(s): INR, APTT in the last 8760  hours.  BMP: Recent Labs    04/13/20 1027 07/06/20 1048 10/12/20 1117 02/05/21 0942  NA 140 138 143 139  K 3.8 3.9 4.0 4.1  CL 106 107 107 105  CO2 26 24 26 25   GLUCOSE 89 93 82 86  BUN 17 15 15 14   CALCIUM 9.1 9.0 9.0 9.1  CREATININE 0.81 0.83 0.80 0.81  GFRNONAA >60 >60 >60 >60    LIVER FUNCTION TESTS: Recent Labs    04/13/20 1027 07/06/20 1048 10/12/20 1117 02/05/21 0942  BILITOT 0.4 0.4 0.4 0.6  AST 23 27 26 22   ALT 25 27 24 22   ALKPHOS 128* 114 123 103  PROT 6.7 7.2 7.0 7.3  ALBUMIN 3.9 4.1 3.8 4.1    Assessment and Plan:  Diffuse Large B-Cell lymphoma, in remission --here for port removal --Pt NPO, has driver home, and spouse will be with her overnight --Pt voices understanding and desire to proceed.  Risks and benefits of image guided port-a-catheter removal was discussed with the patient including, but not limited to bleeding, infection, pneumothorax, or fibrin sheath development and need for additional procedures.  All of the patient's questions were answered, patient is agreeable to proceed. Consent signed and in chart.   Thank you for this interesting consult.  I greatly enjoyed meeting Ely and look forward to participating in their care.  A copy of this report was sent to the requesting provider on this date.  Electronically Signed: Pasty Spillers, PA 02/25/2021, 1:24 PM   I spent a total of 15 Minutes  in face to face in clinical consultation, greater than 50% of which was counseling/coordinating care for Port-a-catheter removal.

## 2021-02-25 NOTE — Procedures (Signed)
Interventional Radiology Procedure:   Indications: History of lymphoma and completed treatment.  Port is no longer needed.   Procedure: Port removal   Findings: Complete removal of the port  Complications: No immediate complications noted.     EBL: Minimal  Plan: Discharge in 1 hour   Calirose Mccance R. Anselm Pancoast, MD  Pager: [phone]

## 2021-02-25 NOTE — Discharge Instructions (Signed)
Please call Interventional Radiology clinic [phone] with any questions or concerns.  You may remove your dressing and shower tomorrow.   Implanted Port Removal, Care After This sheet gives you information about how to care for yourself after your procedure. Your health care provider may also give you more specific instructions. If you have problems or questions, contact your health care provider. What can I expect after the procedure? After the procedure, it is common to have: Soreness or pain near your incision. Some swelling or bruising near your incision. Follow these instructions at home: Medicines Take over-the-counter and prescription medicines only as told by your health care provider. If you were prescribed an antibiotic medicine, take it as told by your health care provider. Do not stop taking the antibiotic even if you start to feel better. Bathing Do not take baths, swim, or use a hot tub until your health care provider approves. Ask your health care provider if you can take showers. You may only be allowed to take sponge baths. Incision care Follow instructions from your health care provider about how to take care of your incision. Make sure you: Wash your hands with soap and water before you change your bandage (dressing). If soap and water are not available, use hand sanitizer. Change your dressing as told by your health care provider. Keep your dressing dry. Leave stitches (sutures), skin glue, or adhesive strips in place. These skin closures may need to stay in place for 2 weeks or longer. If adhesive strip edges start to loosen and curl up, you may trim the loose edges. Do not remove adhesive strips completely unless your health care provider tells you to do that. Check your incision area every day for signs of infection. Check for: More redness, swelling, or pain. More fluid or blood. Warmth. Pus or a bad smell.    Driving Do not drive for 24 hours if you were  given a medicine to help you relax (sedative) during your procedure. If you did not receive a sedative, ask your health care provider when it is safe to drive.    Activity Return to your normal activities as told by your health care provider. Ask your health care provider what activities are safe for you. Do not lift anything that is heavier than 10 lb (4.5 kg), or the limit that you are told, until your health care provider says that it is safe. Do not do activities that involve lifting your arms over your head. General instructions Do not use any products that contain nicotine or tobacco, such as cigarettes and e-cigarettes. These can delay healing. If you need help quitting, ask your health care provider. Keep all follow-up visits as told by your health care provider. This is important. Contact a health care provider if: You have more redness, swelling, or pain around your incision. You have more fluid or blood coming from your incision. Your incision feels warm to the touch. You have pus or a bad smell coming from your incision. You have pain that is not relieved by your pain medicine. Get help right away if you have: A fever or chills. Chest pain. Difficulty breathing. Summary After the procedure, it is common to have pain, soreness, swelling, or bruising near your incision. If you were prescribed an antibiotic medicine, take it as told by your health care provider. Do not stop taking the antibiotic even if you start to feel better. Do not drive for 24 hours if you were given a sedative during  your procedure. Return to your normal activities as told by your health care provider. Ask your health care provider what activities are safe for you. This information is not intended to replace advice given to you by your health care provider. Make sure you discuss any questions you have with your health care provider. Document Revised: 06/18/2017 Document Reviewed: 06/18/2017 Elsevier Patient  Education  2021 [employer].   Moderate Conscious Sedation, Adult, Care After This sheet gives you information about how to care for yourself after your procedure. Your health care provider may also give you more specific instructions. If you have problems or questions, contact your health care provider. What can I expect after the procedure? After the procedure, it is common to have: Sleepiness for several hours. Impaired judgment for several hours. Difficulty with balance. Vomiting if you eat too soon. Follow these instructions at home: For the time period you were told by your health care provider: Rest. Do not participate in activities where you could fall or become injured. Do not drive or use machinery. Do not drink alcohol. Do not take sleeping pills or medicines that cause drowsiness. Do not make important decisions or sign legal documents. Do not take care of children on your own.        Eating and drinking Follow the diet recommended by your health care provider. Drink enough fluid to keep your urine pale yellow. If you vomit: Drink water, juice, or soup when you can drink without vomiting. Make sure you have little or no nausea before eating solid foods.    General instructions Take over-the-counter and prescription medicines only as told by your health care provider. Have a responsible adult stay with you for the time you are told. It is important to have someone help care for you until you are awake and alert. Do not smoke. Keep all follow-up visits as told by your health care provider. This is important. Contact a health care provider if: You are still sleepy or having trouble with balance after 24 hours. You feel light-headed. You keep feeling nauseous or you keep vomiting. You develop a rash. You have a fever. You have redness or swelling around the IV site. Get help right away if: You have trouble breathing. You have new-onset confusion at  home. Summary After the procedure, it is common to feel sleepy, have impaired judgment, or feel nauseous if you eat too soon. Rest after you get home. Know the things you should not do after the procedure. Follow the diet recommended by your health care provider and drink enough fluid to keep your urine pale yellow. Get help right away if you have trouble breathing or new-onset confusion at home. This information is not intended to replace advice given to you by your health care provider. Make sure you discuss any questions you have with your health care provider. Document Revised: 09/02/2019 Document Reviewed: 03/31/2019 Elsevier Patient Education  2021 [employer].

## 2021-04-17 ENCOUNTER — Other Ambulatory Visit: Payer: Self-pay

## 2021-04-17 ENCOUNTER — Ambulatory Visit (INDEPENDENT_AMBULATORY_CARE_PROVIDER_SITE_OTHER): Payer: BC Managed Care – PPO | Admitting: Gastroenterology

## 2021-04-17 ENCOUNTER — Encounter: Payer: Self-pay | Admitting: Gastroenterology

## 2021-04-17 VITALS — BP 122/96 | HR 73 | Ht <= 58 in | Wt 161.0 lb

## 2021-04-17 DIAGNOSIS — K50919 Crohn's disease, unspecified, with unspecified complications: Secondary | ICD-10-CM

## 2021-04-17 DIAGNOSIS — C8590 Non-Hodgkin lymphoma, unspecified, unspecified site: Secondary | ICD-10-CM

## 2021-04-17 MED ORDER — MESALAMINE 1.2 G PO TBEC: 2.4000 g | DELAYED_RELEASE_TABLET | Freq: Every day | ORAL | 4 refills | Status: DC

## 2021-04-17 NOTE — Patient Instructions (Signed)
If you are age 61 or older, your body mass index should be between 23-30. Your Body mass index is 34.24 kg/m. If this is out of the aforementioned range listed, please consider follow up with your Primary Care Provider.  If you are age 36 or younger, your body mass index should be between 19-25. Your Body mass index is 34.24 kg/m. If this is out of the aformentioned range listed, please consider follow up with your Primary Care Provider.   ________________________________________________________  The [place] [facility] providers would like to encourage you to use [facility] to communicate with providers for non-urgent requests or questions.  Due to long hold times on the telephone, sending your provider a message by [facility] may be a faster and more efficient way to get a response.  Please allow 48 business hours for a response.  Please remember that this is for non-urgent requests.  _______________________________________________________  We have sent the following medications to your pharmacy for you to pick up at your convenience: Lialda   Please call to schedule an appointment in 1 year  Repeat colon 09-2023. We will send a letter as it gets closer. You can call 2 months prior to get scheduled.  Please call with any question or concerns.  Thank you,  Dr. Jackquline Denmark

## 2021-04-17 NOTE — Progress Notes (Signed)
Chief Complaint:   Referring Provider:  [name], MD      ASSESSMENT AND PLAN;   #1. Stage IV diffuse large B-cell lymphoma 2020 s/p chemo (S/p 1 cycle  of R-CHOP and 5 cycles of da EPOCH-R and IT MTX x 4 for CNS prophylaxis) in complete remission.  Neg CT C/A/P with contrast February 2022  #3.  Likely quiescent Crohn's disease: Dx 2004, H/O PSBO 02/2007 managed conservatively, involving TI and right colon.  In remission with Lialda.  Neg colon 08/2014. Last colon 09/2017 Dr Jerilynn Mages neg to TI.   Plan: - Contine oatmeal and high-fiber diet - Continue mesalamine ER (1.2g/t) 2/day (180), 4 rf - FU in 1 year. - Colon 09/2023.  Certainly earlier, if with any new problems. - Discussed extensively with the patient thru interpretor  HPI:    Denise Walls is a 60 y.o. female  Speaks little English, history through interpreter. For follow-up visit. Has been doing very well from GI standpoint. Tolerating mesalamine 2.4 g p.o. daily.  Does not wish to go off as she had " flareup" when she went off in 2019 after last colonoscopy. Here for prescription refill  No GI complaints.  No diarrhea or constipation.  No abdominal pain.  No weight loss.  Had CT Abdo/pelvis/chest with contrast February 2022 which was negative for any lymphoma recurrence.   Past GI procedures: -Colonoscopy 09/11/2014 ([facility]) small internal hemorrhoids.  No active Crohn's.  Normal TI. Bx- neg. -Colonoscopy 10/02/2017-normal to TI except for internal hemorrhoids.  Biopsies-terminal ileal biopsies, right colonic biopsies and left colonic biopsies were unremarkable.  Recommended to repeat in 5 years.   -EGD 10/02/2017-gastritis, negative CLOtest  CT chest Abdo/pelvis with contrast 07/06/2020 1. No evidence of recurrent disease in the chest, abdomen or pelvis. 2. Post treatment changes in the chest and abdomen as described. 3. Subtle sclerosis at L2 unchanged since January of 2020, may also be indicative of treated  disease and did not show uptake on prior PET imaging, attention on follow-up. 4. Hepatic steatosis. 5. Aortic atherosclerosis.  Past Medical History:  Diagnosis Date  . Acute cystitis   . Anxiety   . Arthritis   . Cancer ([place])   . Crohn disease ([place])    dx 2004, history of small bowel obstruction 02/2007 treated conservatively  . History of colon polyps   . History of shingles   . HTN (hypertension)   . Hypercholesterolemia   . Insomnia   . Migraine   . Uterine fibroid     Past Surgical History:  Procedure Laterality Date  . COLONOSCOPY  09/11/2014   Small internal hemorrhoids. Otherwise normal colonoscopy to terminal ileum  . COLONOSCOPY  10/02/2017  . ESOPHAGOGASTRODUODENOSCOPY  04/05/2007   Mild gastritis. Otherwise, normal esophagogastroduodenoscopy  . IR IMAGING GUIDED PORT INSERTION  11/29/2018  . IR REMOVAL TUN ACCESS W/ PORT W/O FL MOD SED  02/25/2021  . LYMPH NODE BIOPSY Left 12/09/2018   Procedure: LEFT DEEP CERVICAL LYMPH NODE BIOPSY;  Surgeon: Fanny Skates, MD;  Location: [facility];  Service: General;  Laterality: Left;  . TUBAL LIGATION      Family History  Problem Relation Age of Onset  . Heart disease Father   . Arthritis Sister   . Heart disease Sister   . Colon cancer Neg Hx     Social History   Tobacco Use  . Smoking status: Never  . Smokeless tobacco: Never  Vaping Use  . Vaping Use: Never used  Substance Use Topics  . Alcohol use: Never  . Drug use: Never    Current Outpatient Medications  Medication Sig Dispense Refill  . acetaminophen (TYLENOL) 325 MG tablet Take 325-650 mg by mouth every 6 (six) hours as needed for mild pain.    . Alpha-Lipoic Acid 600 MG TABS Take by mouth.    . cholecalciferol (VITAMIN D3) 25 MCG (1000 UT) tablet Take 1 tablet (1,000 Units total) by mouth daily. 30 tablet 3  . mesalamine (LIALDA) 1.2 g EC tablet TAKE 2 TABLETS (2.4 G TOTAL) BY MOUTH DAILY. PLEASE CALL [phone] TO SCHEDULE AN OFFICE VISIT FOR MORE  REFILLS. 60 tablet 2  . Prenatal Vit-Fe Fumarate-FA (MULTIVITAMIN-PRENATAL) 27-0.8 MG TABS tablet Take 1 tablet by mouth daily at 12 noon.    . venlafaxine XR (EFFEXOR-XR) 37.5 MG 24 hr capsule TOME UNA C PSULA TODOS LOS D AS    . amLODipine (NORVASC) 5 MG tablet TAKE 1 TABLET (5 MG TOTAL) BY MOUTH DAILY. 30 tablet 11   No current facility-administered medications for this visit.    Allergies  Allergen Reactions  . Nsaids Anaphylaxis and Other (See Comments)    Abdominal pain, GI Bleeding     Review of Systems:  neg     Physical Exam:    BP (!) 122/96 (BP Location: Left Arm, Patient Position: Sitting, Cuff Size: Normal)   Pulse 73   Ht 4' 9.5" (1.461 m)   Wt 161 lb (73 kg)   LMP  (LMP Unknown)   SpO2 98%   BMI 34.24 kg/m  Filed Weights   04/17/21 1556  Weight: 161 lb (73 kg)   Gen: awake, alert, NAD HEENT: anicteric, no pallor CV: RRR, no mrg Pulm: CTA b/l Abd: soft, NT/ND, +BS throughout Ext: no c/c/e Neuro: nonfocal    Carmell Austria, MD 04/17/2021, 4:09 PM  Cc: [name], MD

## 2021-06-07 ENCOUNTER — Inpatient Hospital Stay (HOSPITAL_BASED_OUTPATIENT_CLINIC_OR_DEPARTMENT_OTHER): Payer: BC Managed Care – PPO

## 2021-06-07 ENCOUNTER — Other Ambulatory Visit: Payer: Self-pay

## 2021-06-07 ENCOUNTER — Inpatient Hospital Stay: Payer: BC Managed Care – PPO | Attending: Hematology | Admitting: Hematology

## 2021-06-07 VITALS — BP 140/93 | HR 73 | Temp 97.9°F | Resp 18 | Wt 158.5 lb

## 2021-06-07 DIAGNOSIS — C8330 Diffuse large B-cell lymphoma, unspecified site: Secondary | ICD-10-CM

## 2021-06-07 LAB — CMP (CANCER CENTER ONLY)
ALT: 20 U/L (ref 0–44)
AST: 20 U/L (ref 15–41)
Albumin: 4.2 g/dL (ref 3.5–5.0)
Alkaline Phosphatase: 92 U/L (ref 38–126)
Anion gap: 8 (ref 5–15)
BUN: 14 mg/dL (ref 8–23)
CO2: 27 mmol/L (ref 22–32)
Calcium: 9.2 mg/dL (ref 8.9–10.3)
Chloride: 104 mmol/L (ref 98–111)
Creatinine: 0.75 mg/dL (ref 0.44–1.00)
GFR, Estimated: >60 mL/min (ref >60)
Glucose, Bld: 97 mg/dL (ref 70–99)
Potassium: 3.7 mmol/L (ref 3.5–5.1)
Sodium: 139 mmol/L (ref 135–145)
Total Bilirubin: 0.4 mg/dL (ref 0.3–1.2)
Total Protein: 7.3 g/dL (ref 6.5–8.1)

## 2021-06-07 LAB — CBC WITH DIFFERENTIAL/PLATELET
Abs Immature Granulocytes: 0.01 10*3/uL (ref 0.00–0.07)
Basophils Absolute: 0 10*3/uL (ref 0.0–0.1)
Basophils Relative: 1 %
Eosinophils Absolute: 0.1 10*3/uL (ref 0.0–0.5)
Eosinophils Relative: 1 %
HCT: 39.5 % (ref 36.0–46.0)
Hemoglobin: 13 g/dL (ref 12.0–15.0)
Immature Granulocytes: 0 %
Lymphocytes Relative: 38 %
Lymphs Abs: 2.3 10*3/uL (ref 0.7–4.0)
MCH: 29.1 pg (ref 26.0–34.0)
MCHC: 32.9 g/dL (ref 30.0–36.0)
MCV: 88.6 fL (ref 80.0–100.0)
Monocytes Absolute: 0.3 10*3/uL (ref 0.1–1.0)
Monocytes Relative: 6 %
Neutro Abs: 3.2 10*3/uL (ref 1.7–7.7)
Neutrophils Relative %: 54 %
Platelets: 259 10*3/uL (ref 150–400)
RBC: 4.46 MIL/uL (ref 3.87–5.11)
RDW: 13.2 % (ref 11.5–15.5)
WBC: 6 10*3/uL (ref 4.0–10.5)
nRBC: 0 % (ref 0.0–0.2)

## 2021-06-07 LAB — LACTATE DEHYDROGENASE: LDH: 237 U/L — ABNORMAL HIGH (ref 98–192)

## 2021-06-13 ENCOUNTER — Encounter: Payer: Self-pay | Admitting: Hematology

## 2021-06-13 NOTE — Progress Notes (Addendum)
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  HISTORY OF PRESENTING ILLNESS:  Please see previous notes for details on initial presentation  INTERVAL HISTORY:   [facility] is here for her scheduled follow-up for continued surveillance of her large B-cell lymphoma. She notes she has had her Port-A-Cath removed uneventfully. No fevers no chills no night sweats. New lumps or bumps noted. Pain or distention. Some irregular bowel habits which she attributes to her Crohn's disease.  She continues to be on Lialda with [facility]. [facility] gastroenterology. No other acute new focal symptoms. She notes some fatigue and some balance issues and would like to work with outpatient physical therapy and we discussed giving her a referral. Labs done today were reviewed with her.  MEDICAL HISTORY:  Past Medical History:  Diagnosis Date   Acute cystitis    Anxiety    Arthritis    Cancer ([place])    Crohn disease ([place])    dx 2004, history of small bowel obstruction 02/2007 treated conservatively   History of colon polyps    History of shingles    HTN (hypertension)    Hypercholesterolemia    Insomnia    Migraine    Uterine fibroid     SURGICAL HISTORY: Past Surgical History:  Procedure Laterality Date   COLONOSCOPY  09/11/2014   Small internal hemorrhoids. Otherwise normal colonoscopy to terminal ileum   COLONOSCOPY  10/02/2017   ESOPHAGOGASTRODUODENOSCOPY  04/05/2007   Mild gastritis. Otherwise, normal esophagogastroduodenoscopy   IR IMAGING GUIDED PORT INSERTION  11/29/2018   IR REMOVAL TUN ACCESS W/ PORT W/O FL MOD SED  02/25/2021   LYMPH NODE BIOPSY Left 12/09/2018   Procedure: LEFT DEEP CERVICAL LYMPH NODE BIOPSY;  Surgeon: Fanny Skates, MD;  Location: [place] [facility];  Service: General;  Laterality:  Left;   TUBAL LIGATION      SOCIAL HISTORY: Social History   Socioeconomic History   Marital status: Married    Spouse name: Not on file   Number of children: Not on file   Years of education: Not on file   Highest education level: 6th grade  Occupational History   Not on file  Tobacco Use   Smoking status: Never   Smokeless tobacco: Never  Vaping Use   Vaping Use: Never used  Substance and Sexual Activity   Alcohol use: Never   Drug use: Never   Sexual activity: Not Currently    Birth control/protection: Post-menopausal  Other Topics Concern   Not on file  Social History Narrative   Not on file   Social Determinants of Health   Financial Resource Strain: Not on file  Food Insecurity: Not on file  Transportation Needs: No Transportation Needs   Lack of Transportation (Medical): No   Lack of Transportation (Non-Medical): No  Physical Activity: Not on file  Stress: Not on file  Social Connections: Not on file  Intimate Partner Violence: Not on file    FAMILY HISTORY: Family History  Problem Relation Age of Onset   Heart disease Father    Arthritis Sister    Heart disease Sister    Colon cancer Neg Hx     ALLERGIES:  is allergic to nsaids.  MEDICATIONS:  Current Outpatient Medications  Medication Sig Dispense Refill   acetaminophen (TYLENOL) 325 MG tablet Take 325-650 mg by mouth  every 6 (six) hours as needed for mild pain.     Alpha-Lipoic Acid 600 MG TABS Take by mouth.     amLODipine (NORVASC) 5 MG tablet TAKE 1 TABLET (5 MG TOTAL) BY MOUTH DAILY. 30 tablet 11   cholecalciferol (VITAMIN D3) 25 MCG (1000 UT) tablet Take 1 tablet (1,000 Units total) by mouth daily. 30 tablet 3   mesalamine (LIALDA) 1.2 g EC tablet Take 2 tablets (2.4 g total) by mouth daily. 180 tablet 4   Prenatal Vit-Fe Fumarate-FA (MULTIVITAMIN-PRENATAL) 27-0.8 MG TABS tablet Take 1 tablet by mouth daily at 12 noon.     venlafaxine XR (EFFEXOR-XR) 37.5 MG 24 hr capsule TOME UNA C PSULA  TODOS LOS D AS     No current facility-administered medications for this visit.    REVIEW OF SYSTEMS:   .10 Point review of Systems was done is negative except as noted above.   PHYSICAL EXAMINATION: ECOG PERFORMANCE STATUS: 2 - Symptomatic, <50% confined to bed  . Vitals:   06/07/21 1250  BP: (!) 140/93  Pulse: 73  Resp: 18  Temp: 97.9 F (36.6 C)  SpO2: 99%   Filed Weights   06/07/21 1250  Weight: 158 lb 8 oz (71.9 kg)   .Body mass index is 33.71 kg/m.  NAD . GENERAL:alert, in no acute distress and comfortable SKIN: no acute rashes, no significant lesions EYES: conjunctiva are pink and non-injected, sclera anicteric OROPHARYNX: MMM, no exudates, no oropharyngeal erythema or ulceration NECK: supple, no JVD LYMPH:  no palpable lymphadenopathy in the cervical, axillary or inguinal regions LUNGS: clear to auscultation b/l with normal respiratory effort HEART: regular rate & rhythm ABDOMEN:  normoactive bowel sounds , non tender, not distended. Extremity: no pedal edema PSYCH: alert & oriented x 3 with fluent speech NEURO: no focal motor/sensory deficits   LABORATORY DATA:  I have reviewed the data as listed  . CBC Latest Ref Rng & Units 06/07/2021 02/05/2021 10/12/2020  WBC 4.0 - 10.5 K/uL 6.0 5.4 4.8  Hemoglobin 12.0 - 15.0 g/dL 13.0 13.0 12.5  Hematocrit 36.0 - 46.0 % 39.5 39.3 38.1  Platelets 150 - 400 K/uL 259 231 231    . CMP Latest Ref Rng & Units 06/07/2021 02/05/2021 10/12/2020  Glucose 70 - 99 mg/dL 97 86 82  BUN 8 - 23 mg/dL 14 14 15   Creatinine 0.44 - 1.00 mg/dL 0.75 0.81 0.80  Sodium 135 - 145 mmol/L 139 139 143  Potassium 3.5 - 5.1 mmol/L 3.7 4.1 4.0  Chloride 98 - 111 mmol/L 104 105 107  CO2 22 - 32 mmol/L 27 25 26   Calcium 8.9 - 10.3 mg/dL 9.2 9.1 9.0  Total Protein 6.5 - 8.1 g/dL 7.3 7.3 7.0  Total Bilirubin 0.3 - 1.2 mg/dL 0.4 0.6 0.4  Alkaline Phos 38 - 126 U/L 92 103 123  AST 15 - 41 U/L 20 22 26   ALT 0 - 44 U/L 20 22 24    . Lab  Results  Component Value Date   LDH 237 (H) 06/07/2021      RADIOGRAPHIC STUDIES: I have personally reviewed the radiological images as listed and agreed with the findings in the report. No results found.  ASSESSMENT & PLAN:   1. Stage IV DLBCL, GCB subtype; double hit lymphoma -S/p 1 cycle  of R-CHOP and 5 cycles of da EPOCH-R and IT MTX x 4 for CNS prophylaxis -FISH  on 01/03/2019 and were positive for Myc and BCL2 rearrangement  02/14/2019 PET Scan Whole Body (  [id number]) revealed "1. Findings favor complete metabolic response. No residual hypermetabolism within the lymph nodes of the neck, chest, abdomen or pelvis, which have all decreased in size in the interval. 2. Nonspecific new diffuse skeletal hypermetabolism and new mild splenic hypermetabolism, favor reactive state of the marrow and reticuloendothelial system. Spleen is normal size. Continued surveillance with CT or PET-CT advised. 3. Small layering left pleural effusion, decreased. 4.  Aortic Atherosclerosis (ICD10-I70.0)."  -Completed 5 cycles of R-EPOCH on 04/04/2019, 6 total cycles of chemotherapy (1st cycle was R-CHOP) -Completed 4 cycles of IT METHOTREXATE on 04/25/2019  05/23/2019 PET/CT scan ([id number]) revealed "Small calcified jejunal mesentery nodes, compatible with treated lymphoma. Deauville category 2. No findings suspicious for active lymphoma. Prior splenic and osseous hypermetabolism is no longer evident."  PLAN:  -Patient's labs from today 06/07/2021 show normal CBC with a hemoglobin of 13 WBC count of 6k and platelets of 259k CMP within normal limits LDH 237 which is stable and somewhat lower than her last value of 254.  Does not seem to correlate with her lymphoma status. Port-A-Cath has been removed and the site has healed. Patient has lab or clinical findings suggestive of lymphoma recurrence at this time. She notes some fatigue and balance/mobility issues and was given a referral to cancer rehab center  for outpatient physical therapy.    FOLLOW UP: RTC with [facility] with labs in 3 months Referral for outpatient PT- Cancer rehab center   All of the patient's questions were answered with apparent satisfaction. The patient knows to call the clinic with any problems, questions or concerns.   Sullivan Lone MD MS AAHIVMS [facility] Hematology/Oncology Physician [facility]    .Marland Kitchen

## 2021-06-18 NOTE — Addendum Note (Signed)
Addended by: Sullivan Lone on: 06/18/2021 11:44 AM   Modules accepted: Orders

## 2021-07-09 ENCOUNTER — Telehealth: Payer: Self-pay | Admitting: Gastroenterology

## 2021-07-09 NOTE — Telephone Encounter (Signed)
Left message for daughter Macon Large to call back:

## 2021-07-09 NOTE — Telephone Encounter (Addendum)
Pt called stating that she has been having back pain since a while ago. Her PCP gave her gabapentin but it did not help her. PCP directed pt to [name] because he thinks that pain is related to her crohns. He told pt that he could not help her in that regard. Pls call pt's daughter Macon Large.

## 2021-07-09 NOTE — Telephone Encounter (Signed)
Left message for pt to call back  °

## 2021-07-10 NOTE — Telephone Encounter (Signed)
Through a spanish Interpreter: Pt stated that she recently went to her PCP complaining Left Groin pain/ Hip pain: Scans done and revealed arthritis: Pt stated that her PCP wanted to see [name] because the pain could be related to Crohn's: Pt was scheduled for an office visit 08/02/2021 at 3:00: Pt made aware Pt verbalized understanding with all questions answered.

## 2021-07-24 ENCOUNTER — Encounter (HOSPITAL_COMMUNITY): Payer: Self-pay | Admitting: Internal Medicine

## 2021-07-24 ENCOUNTER — Other Ambulatory Visit: Payer: Self-pay

## 2021-07-24 ENCOUNTER — Inpatient Hospital Stay (HOSPITAL_COMMUNITY)
Admission: AD | Admit: 2021-07-24 | Discharge: 2021-08-01 | DRG: 478 | Disposition: A | Discharge status: Home / Self Care | Payer: BC Managed Care – PPO | Source: Other Acute Inpatient Hospital | Attending: Internal Medicine | Admitting: Internal Medicine

## 2021-07-24 DIAGNOSIS — M810 Age-related osteoporosis without current pathological fracture: Secondary | ICD-10-CM | POA: Diagnosis present

## 2021-07-24 DIAGNOSIS — K5289 Other specified noninfective gastroenteritis and colitis: Secondary | ICD-10-CM | POA: Diagnosis present

## 2021-07-24 DIAGNOSIS — K5900 Constipation, unspecified: Secondary | ICD-10-CM | POA: Diagnosis present

## 2021-07-24 DIAGNOSIS — R262 Difficulty in walking, not elsewhere classified: Secondary | ICD-10-CM | POA: Diagnosis present

## 2021-07-24 DIAGNOSIS — K5090 Crohn's disease, unspecified, without complications: Secondary | ICD-10-CM | POA: Diagnosis present

## 2021-07-24 DIAGNOSIS — I10 Essential (primary) hypertension: Secondary | ICD-10-CM | POA: Diagnosis present

## 2021-07-24 DIAGNOSIS — S22080A Wedge compression fracture of T11-T12 vertebra, initial encounter for closed fracture: Secondary | ICD-10-CM | POA: Diagnosis not present

## 2021-07-24 DIAGNOSIS — C8330 Diffuse large B-cell lymphoma, unspecified site: Secondary | ICD-10-CM | POA: Diagnosis not present

## 2021-07-24 DIAGNOSIS — R7303 Prediabetes: Secondary | ICD-10-CM | POA: Diagnosis present

## 2021-07-24 DIAGNOSIS — E7800 Pure hypercholesterolemia, unspecified: Secondary | ICD-10-CM | POA: Diagnosis present

## 2021-07-24 DIAGNOSIS — E871 Hypo-osmolality and hyponatremia: Secondary | ICD-10-CM | POA: Diagnosis present

## 2021-07-24 DIAGNOSIS — M4854XA Collapsed vertebra, not elsewhere classified, thoracic region, initial encounter for fracture: Secondary | ICD-10-CM | POA: Diagnosis present

## 2021-07-24 DIAGNOSIS — Z886 Allergy status to analgesic agent status: Secondary | ICD-10-CM | POA: Diagnosis not present

## 2021-07-24 DIAGNOSIS — Z9221 Personal history of antineoplastic chemotherapy: Secondary | ICD-10-CM | POA: Diagnosis not present

## 2021-07-24 DIAGNOSIS — G4700 Insomnia, unspecified: Secondary | ICD-10-CM | POA: Diagnosis present

## 2021-07-24 DIAGNOSIS — I700 Atherosclerosis of aorta: Secondary | ICD-10-CM | POA: Diagnosis present

## 2021-07-24 DIAGNOSIS — Z8249 Family history of ischemic heart disease and other diseases of the circulatory system: Secondary | ICD-10-CM

## 2021-07-24 DIAGNOSIS — Z79899 Other long term (current) drug therapy: Secondary | ICD-10-CM | POA: Diagnosis not present

## 2021-07-24 DIAGNOSIS — F32A Depression, unspecified: Secondary | ICD-10-CM | POA: Diagnosis present

## 2021-07-24 DIAGNOSIS — K760 Fatty (change of) liver, not elsewhere classified: Secondary | ICD-10-CM | POA: Diagnosis present

## 2021-07-24 DIAGNOSIS — R944 Abnormal results of kidney function studies: Secondary | ICD-10-CM | POA: Diagnosis not present

## 2021-07-24 DIAGNOSIS — E860 Dehydration: Secondary | ICD-10-CM | POA: Diagnosis present

## 2021-07-24 DIAGNOSIS — Z8572 Personal history of non-Hodgkin lymphomas: Secondary | ICD-10-CM | POA: Diagnosis not present

## 2021-07-24 DIAGNOSIS — Z8261 Family history of arthritis: Secondary | ICD-10-CM | POA: Diagnosis not present

## 2021-07-24 DIAGNOSIS — F419 Anxiety disorder, unspecified: Secondary | ICD-10-CM | POA: Diagnosis present

## 2021-07-24 MED ORDER — ONDANSETRON HCL 4 MG PO TABS: 4.0000 mg | ORAL_TABLET | Freq: Four times a day (QID) | ORAL | Status: DC | PRN

## 2021-07-24 MED ORDER — HYDRALAZINE HCL 20 MG/ML IJ SOLN: 10.0000 mg | Freq: Four times a day (QID) | INTRAMUSCULAR | Status: DC | PRN

## 2021-07-24 MED ORDER — ENOXAPARIN SODIUM 40 MG/0.4ML IJ SOSY
40.0000 mg | PREFILLED_SYRINGE | INTRAMUSCULAR | Status: DC
  Administered 2021-07-24 – 2021-07-30 (×7): 40 mg via SUBCUTANEOUS
  Filled 2021-07-24 (×8): qty 0.4

## 2021-07-24 MED ORDER — ACETAMINOPHEN 650 MG RE SUPP: 650.0000 mg | Freq: Four times a day (QID) | RECTAL | Status: DC | PRN

## 2021-07-24 MED ORDER — ONDANSETRON HCL 4 MG/2ML IJ SOLN: 4.0000 mg | Freq: Four times a day (QID) | INTRAMUSCULAR | Status: DC | PRN

## 2021-07-24 MED ORDER — AMLODIPINE BESYLATE 2.5 MG PO TABS
2.5000 mg | ORAL_TABLET | Freq: Every day | ORAL | Status: DC
  Administered 2021-07-24 – 2021-08-01 (×9): 2.5 mg via ORAL
  Filled 2021-07-24 (×10): qty 1

## 2021-07-24 MED ORDER — MORPHINE SULFATE (PF) 2 MG/ML IV SOLN
2.0000 mg | INTRAVENOUS | Status: DC | PRN
  Administered 2021-07-25 – 2021-07-26 (×3): 2 mg via INTRAVENOUS
  Filled 2021-07-24 (×3): qty 1

## 2021-07-24 MED ORDER — ALBUTEROL SULFATE (2.5 MG/3ML) 0.083% IN NEBU
2.5000 mg | INHALATION_SOLUTION | Freq: Two times a day (BID) | RESPIRATORY_TRACT | Status: DC
  Administered 2021-07-25: 08:00:00 2.5 mg via RESPIRATORY_TRACT
  Filled 2021-07-24: qty 3

## 2021-07-24 MED ORDER — BISACODYL 10 MG RE SUPP: 10.0000 mg | Freq: Every day | RECTAL | Status: DC | PRN

## 2021-07-24 MED ORDER — ALBUTEROL SULFATE (2.5 MG/3ML) 0.083% IN NEBU
2.5000 mg | INHALATION_SOLUTION | Freq: Four times a day (QID) | RESPIRATORY_TRACT | Status: DC
  Administered 2021-07-24: 2.5 mg via RESPIRATORY_TRACT
  Filled 2021-07-24: qty 3

## 2021-07-24 MED ORDER — ENOXAPARIN SODIUM 40 MG/0.4ML IJ SOSY: 40.0000 mg | PREFILLED_SYRINGE | INTRAMUSCULAR | Status: DC

## 2021-07-24 MED ORDER — SENNA 8.6 MG PO TABS
1.0000 | ORAL_TABLET | Freq: Two times a day (BID) | ORAL | Status: DC
  Administered 2021-07-24 – 2021-08-01 (×15): 8.6 mg via ORAL
  Filled 2021-07-24 (×16): qty 1

## 2021-07-24 MED ORDER — POLYETHYLENE GLYCOL 3350 17 G PO PACK
17.0000 g | PACK | Freq: Every day | ORAL | Status: DC | PRN
  Administered 2021-07-26: 17 g via ORAL
  Filled 2021-07-24: qty 1

## 2021-07-24 MED ORDER — ACETAMINOPHEN 325 MG PO TABS
650.0000 mg | ORAL_TABLET | Freq: Four times a day (QID) | ORAL | Status: DC | PRN
Start: 2021-07-24 — End: 2021-08-01

## 2021-07-24 MED ORDER — OXYCODONE HCL 5 MG PO TABS
5.0000 mg | ORAL_TABLET | ORAL | Status: DC | PRN
  Administered 2021-07-24 – 2021-07-27 (×3): 5 mg via ORAL
  Filled 2021-07-24 (×4): qty 1

## 2021-07-24 MED ORDER — AMLODIPINE BESYLATE 2.5 MG PO TABS
2.5000 mg | ORAL_TABLET | Freq: Every day | ORAL | Status: DC
Start: 2021-07-24 — End: 2021-07-24

## 2021-07-24 NOTE — H&P (Signed)
[facility] History and Physical  [name] PYP:[id number] DOB: Aug 08, 1960 DOA: 07/24/2021 PCP: [facility] Berger, MD  Admitted from: Home Chief Complaint: Back pain  History of Present Illness: [facility] is a 61 y.o. female with PMH significant for non-Hodgkin's lymphoma, Crohn's disease, prediabetes, HTN, HLD, arthritis, anxiety, insomnia, migraine, uterine fibroid. Patient had large cell non-Hodgkin's lymphoma in 2020 that was treated with chemotherapy.  Since then she is in remission.  She was in her usual state of health until December 2022 when she started having lower back pain.  It was initially suspected to be due to osteoarthritis however her symptoms progressed.  For the last 2 weeks, patient was not able to walk and had numbness in bilateral lower extremity. She underwent an MRI at [facility] which resulted to show pathological fracture in T12.  PCP directed her to the ED at [facility].  ED physician at [facility] discussed the case with patient's oncologist Dr. Irene [facility] as well as neurosurgeon Dr. Reatha Wall[facility].  Neurosurgeon recommended to transfer the patient to [facility] as she may need biopsy for fracture site. Patient arrived to [facility] this afternoon.  Last set of labs from care everywhere was from 3/7. BMP unremarkable except for BUN/creatinine elevated to 26/0.66 CBC unremarkable Hemoglobin 13.1, sodium 140  At the time of my evaluation, patient was accompanied by her daughter at bedside who helped for interpretation.  History reviewed with her. While waiting at [facility] for bed availability at [facility], patient was using bedpan for bowel and bladder function.  Able to move lower extremities.  No loss of bowel/bladder function.  Review of Systems:  All systems were reviewed and were negative unless otherwise mentioned in the HPI   Past medical history: Past Medical History:  Diagnosis Date   Acute cystitis    Anxiety    Arthritis     Cancer ([facility])    Crohn disease ([facility])    dx 2004, history of small bowel obstruction 02/2007 treated conservatively   History of colon polyps    History of shingles    HTN (hypertension)    Hypercholesterolemia    Insomnia    Migraine    Uterine fibroid     Past surgical history: Past Surgical History:  Procedure Laterality Date   COLONOSCOPY  09/11/2014   Small internal hemorrhoids. Otherwise normal colonoscopy to terminal ileum   COLONOSCOPY  10/02/2017   ESOPHAGOGASTRODUODENOSCOPY  04/05/2007   Mild gastritis. Otherwise, normal esophagogastroduodenoscopy   IR IMAGING GUIDED PORT INSERTION  11/29/2018   IR REMOVAL TUN ACCESS W/ PORT W/O FL MOD SED  02/25/2021   LYMPH NODE BIOPSY Left 12/09/2018   Procedure: LEFT DEEP CERVICAL LYMPH NODE BIOPSY;  Surgeon: [facility]ise Skates, MD;  Location: [facility];  Service: General;  Laterality: Left;   TUBAL LIGATION      Social History:  reports that she has never smoked. She has never used smokeless tobacco. She reports that she does not drink alcohol and does not use drugs.  Allergies:  Allergies  Allergen Reactions   Nsaids Anaphylaxis and Other (See Comments)    Abdominal pain, GI Bleeding     Family history:  Family History  Problem Relation Age of Onset   Heart disease Father    Arthritis Sister    Heart disease Sister    Colon cancer Neg Hx      Home Meds: Prior to Admission medications   Medication Sig Start Date End Date Taking? Authorizing Provider  acetaminophen (TYLENOL) 325 MG tablet Take 325-650 mg by mouth every 6 (six) hours as needed for mild pain.    [provider]  Alpha-Lipoic Acid 600 MG TABS Take by mouth. 12/11/20   [provider]  amLODipine (NORVASC) 5 MG tablet TAKE 1 TABLET (5 MG TOTAL) BY MOUTH DAILY. 12/30/19 12/29/20  Riesa Pope, MD  cholecalciferol (VITAMIN D3) 25 MCG (1000 UT) tablet Take 1 tablet (1,000 Units total) by mouth daily. 02/04/19   [name], MD   mesalamine (LIALDA) 1.2 g EC tablet Take 2 tablets (2.4 g total) by mouth daily. 04/17/21   Jackquline Denmark, MD  Prenatal Vit-Fe Fumarate-FA (MULTIVITAMIN-PRENATAL) 27-0.8 MG TABS tablet Take 1 tablet by mouth daily at 12 noon.    [provider]  venlafaxine XR (EFFEXOR-XR) 37.5 MG 24 hr capsule TOME UNA C PSULA TODOS LOS D AS 02/24/21   [provider]    Physical Exam: Vitals:   07/24/21 1636  BP: 135/89  Pulse: 93  Resp: 16  Temp: 99.1 F (37.3 C)  TempSrc: Oral  SpO2: 97%   Wt Readings from Last 3 Encounters:  06/07/21 71.9 kg  04/17/21 73 kg  02/05/21 72.3 kg   There is no height or weight on file to calculate BMI.  General exam: Patient middle-aged Hispanic female.  Not in physical distress at rest Skin: No rashes, lesions or ulcers. HEENT: Atraumatic, normocephalic, no obvious bleeding Lungs: Clear to auscultation bilaterally CVS: Regular rate and rhythm, no murmur GI/Abd soft, nontender, nondistended, bowel sound present CNS: Alert, awake, oriented x3.  Able to move lower extremities.   Psychiatry: Mood appropriate Extremities: No pedal edema, no calf tenderness    Labs on Admission:   CBC: No results for input(s): WBC, NEUTROABS, HGB, HCT, MCV, PLT in the last 168 hours.  Basic Metabolic Panel: No results for input(s): NA, K, CL, CO2, GLUCOSE, BUN, CREATININE, CALCIUM, MG, PHOS in the last 168 hours.  Liver Function Tests: No results for input(s): AST, ALT, ALKPHOS, BILITOT, PROT, ALBUMIN in the last 168 hours. No results for input(s): LIPASE, AMYLASE in the last 168 hours. No results for input(s): AMMONIA in the last 168 hours.  Cardiac Enzymes: No results for input(s): CKTOTAL, CKMB, CKMBINDEX, TROPONINI in the last 168 hours.  BNP (last 3 results) No results for input(s): BNP in the last 8760 hours.  ProBNP (last 3 results) No results for input(s): PROBNP in the last 8760 hours.  CBG: No results for input(s): GLUCAP in the last  168 hours.  Lipase     Component Value Date/Time   LIPASE 22 01/27/2019 0954     Urinalysis    Component Value Date/Time   COLORURINE YELLOW 01/27/2019 [place] 01/27/2019 0953   LABSPEC 1.010 01/27/2019 0953   PHURINE 7.0 01/27/2019 0953   GLUCOSEU NEGATIVE 01/27/2019 0953   HGBUR NEGATIVE 01/27/2019 0953   BILIRUBINUR NEGATIVE 01/27/2019 0953   [place] 01/27/2019 0953   PROTEINUR NEGATIVE 01/27/2019 0953   NITRITE NEGATIVE 01/27/2019 0953   LEUKOCYTESUR NEGATIVE 01/27/2019 0953     Drugs of Abuse  No results found for: LABOPIA, COCAINSCRNUR, LABBENZ, AMPHETMU, THCU, LABBARB    Radiological Exams on Admission: No results found.   ------------------------------------------------------------------------------------------------------ Assessment/Plan: Principal Problem:   T12 compression fracture (HCC)   Intractable back pain T2 compression fracture History of large cell non-Hodgkin's lymphoma -Suspicious for pathologic fracture.  Neurosurgery and oncology informed of patient arrival. -We will keep her n.p.o. after midnight just  in case any procedure is planned for tomorrow. -IV and oral pain medicines for now.   Essential hypertension -On amlodipine at home.   -Start low-dose at 2.5 mg daily. -Hydralazine as needed   History of Crohn's disease -Follows up with [name] with [facility]. Upcoming GI appointment on 3/17.  Home med list pending.  Mobility -Needs PT eval post procedure  Goals of care - -  Code Status: Full Code   Diet:  Diet Order             Diet Heart Room service appropriate? Yes; Fluid consistency: Thin  Diet effective now                  DVT prophylaxis:  enoxaparin (LOVENOX) injection 40 mg Start: 07/24/21 1830   Antimicrobials: None Fluid: None Consultants: Neurosurgery, oncology Family Communication: Daughter at bedside Dispo: The patient is from: Home              Anticipated d/c is to:  Home likely              Anticipated d/c date is: > 3 days  ------------------------------------------------------------------------------------- Severity of Illness: The appropriate patient status for this patient is INPATIENT. Inpatient status is judged to be reasonable and necessary in order to provide the required intensity of service to ensure the patient's safety. The patient's presenting symptoms, physical exam findings, and initial radiographic and laboratory data in the context of their chronic comorbidities is felt to place them at high risk for further clinical deterioration. Furthermore, it is not anticipated that the patient will be medically stable for discharge from the hospital within 2 midnights of admission.   * I certify that at the point of admission it is my clinical judgment that the patient will require inpatient hospital care spanning beyond 2 midnights from the point of admission due to high intensity of service, high risk for further deterioration and high frequency of surveillance required.*  Signed, Terrilee Croak, MD [facility] 07/24/2021

## 2021-07-25 ENCOUNTER — Other Ambulatory Visit (HOSPITAL_COMMUNITY): Payer: BC Managed Care – PPO

## 2021-07-25 DIAGNOSIS — C8330 Diffuse large B-cell lymphoma, unspecified site: Secondary | ICD-10-CM

## 2021-07-25 DIAGNOSIS — S22080A Wedge compression fracture of T11-T12 vertebra, initial encounter for closed fracture: Secondary | ICD-10-CM | POA: Diagnosis not present

## 2021-07-25 LAB — BASIC METABOLIC PANEL
Anion gap: 11 (ref 5–15)
BUN: 25 mg/dL — ABNORMAL HIGH (ref 8–23)
CO2: 23 mmol/L (ref 22–32)
Calcium: 9.1 mg/dL (ref 8.9–10.3)
Chloride: 104 mmol/L (ref 98–111)
Creatinine, Ser: 0.79 mg/dL (ref 0.44–1.00)
GFR, Estimated: >60 mL/min (ref >60)
Glucose, Bld: 91 mg/dL (ref 70–99)
Potassium: 3.7 mmol/L (ref 3.5–5.1)
Sodium: 138 mmol/L (ref 135–145)

## 2021-07-25 LAB — CBC
HCT: 41.7 % (ref 36.0–46.0)
Hemoglobin: 13.9 g/dL (ref 12.0–15.0)
MCH: 29.6 pg (ref 26.0–34.0)
MCHC: 33.3 g/dL (ref 30.0–36.0)
MCV: 88.7 fL (ref 80.0–100.0)
Platelets: 310 10*3/uL (ref 150–400)
RBC: 4.7 MIL/uL (ref 3.87–5.11)
RDW: 12.8 % (ref 11.5–15.5)
WBC: 7.3 10*3/uL (ref 4.0–10.5)
nRBC: 0 % (ref 0.0–0.2)

## 2021-07-25 LAB — HIV ANTIBODY (ROUTINE TESTING W REFLEX): HIV Screen 4th Generation wRfx: NONREACTIVE

## 2021-07-25 MED ORDER — MESALAMINE 1.2 G PO TBEC
2.4000 g | DELAYED_RELEASE_TABLET | Freq: Every day | ORAL | Status: DC
  Administered 2021-07-25 – 2021-08-01 (×7): 2.4 g via ORAL
  Filled 2021-07-25 (×8): qty 2

## 2021-07-25 MED ORDER — VENLAFAXINE HCL ER 37.5 MG PO CP24
37.5000 mg | ORAL_CAPSULE | Freq: Every day | ORAL | Status: DC
  Administered 2021-07-26 – 2021-08-01 (×6): 37.5 mg via ORAL
  Filled 2021-07-25 (×7): qty 1

## 2021-07-25 MED ORDER — IOHEXOL 9 MG/ML PO SOLN
ORAL | Status: AC
  Administered 2021-07-25: 15:00:00 500 mL
  Filled 2021-07-25: qty 1000

## 2021-07-25 MED ORDER — ALBUTEROL SULFATE (2.5 MG/3ML) 0.083% IN NEBU: 2.5000 mg | INHALATION_SOLUTION | RESPIRATORY_TRACT | Status: DC | PRN

## 2021-07-25 MED ORDER — TRAMADOL HCL 50 MG PO TABS
50.0000 mg | ORAL_TABLET | Freq: Four times a day (QID) | ORAL | Status: DC | PRN
  Administered 2021-07-31: 50 mg via ORAL
  Filled 2021-07-25: qty 1

## 2021-07-25 NOTE — Progress Notes (Signed)
?PROGRESS NOTE ? ?[place]  ?DOB: 1961/05/03  ?PCP: [name], MD ?ZYS:[id number]  ?DOA: 07/24/2021 ? LOS: 1 day  ?Hospital Day: 2 ? ?Brief narrative: ?[facility] is a 61 y.o. female with PMH significant for non-Hodgkin's lymphoma, Crohn's disease, prediabetes, HTN, HLD, arthritis, anxiety, insomnia, migraine, uterine fibroid. 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 Since then she is in remission.   ?She was in her usual state of health until December 2022 when she started having lower back pain.  It was initially suspected to be due to osteoarthritis however her symptoms progressed.  For the last 2 weeks, patient was not able to walk and had numbness in bilateral lower extremity. ?She underwent an MRI at [facility] which resulted to show pathological fracture in T12.  PCP directed her to the ED at [facility].  ED physician at [facility] discussed the case with patient's oncologist Dr. Irene Limbo as well as neurosurgeon Dr. Reatha Armour.  Neurosurgeon recommended to transfer the patient to [facility]. ? ?Admitted to hospitalist service ?Neurosurgery and oncology consultation appreciated. ? ?Subjective: ?Patient was seen and examined this morning.  Pleasant middle-aged Hispanic female.  Lying on bed.  Not in distress.  No pain at rest but any movement reproduces back pain. ? ?Principal Problem: ?  T12 compression fracture ([place]) ?  ?Assessment and Plan: ?Intractable back pain ?T2 compression fracture ?History of large cell non-Hodgkin's lymphoma ?-Suspicious for pathologic fracture.  Neurosurgery and oncology consulted.  As per oncology recommendation.  I ordered for CT scan of the neck, chest, abdomen and pelvis. ?-May need biopsy by IR ?-Continue pain management. ?  ?Essential hypertension ?-Currently BP controlled on amlodipine 2.5 mg daily ?-Hydralazine as needed ?  ?History of Crohn's disease ?-Continue mesalamine 1.2 g daily  as before. ?Follows up with [name] with [facility]. Upcoming GI appointment on 3/17. ? ?Depression ?-Continue Effexor as before. ?   ?Goals of care ?  Code Status: Full Code  ? ? ?Mobility: Encourage ambulation.  Needs PT eval after biopsy ? ?Nutritional status:  ?There is no height or weight on file to calculate BMI.  ?  ?  ? ? ? ? ?Diet:  ?Diet Order   ? ?       ?  Diet Heart Room service appropriate? Yes; Fluid consistency: Thin  Diet effective now       ?  ? ?  ?  ? ?  ? ? ?DVT prophylaxis:  ?enoxaparin (LOVENOX) injection 40 mg Start: 07/24/21 2200 ?  ?Antimicrobials: None ?Fluid: None ?Consultants: Neurosurgery, oncology ?Family Communication: Son at bedside today ? ?Status is: Inpatient ? ?Continue in-hospital care because: Continue work-up ?Level of care: Med-Surg  ? ?Dispo: The patient is from: Home ?             Anticipated d/c is to: Pending clinical course ?             Patient currently is not medically stable to d/c. ?  Difficult to place patient No ? ? ? ? ?Infusions:  ? ? ?Scheduled Meds: ? amLODipine  2.5 mg Oral Daily  ? enoxaparin (LOVENOX) injection  40 mg Subcutaneous Q24H  ? iohexol      ? mesalamine  2.4 g Oral Daily  ? senna  1 tablet Oral BID  ? [START ON 07/26/2021] venlafaxine XR  37.5 mg Oral Q breakfast  ? ? ?PRN meds: ?acetaminophen **OR** acetaminophen, albuterol,  bisacodyl, hydrALAZINE, morphine injection, ondansetron **OR** ondansetron (ZOFRAN) IV, oxyCODONE, polyethylene glycol, traMADol  ? ?Antimicrobials: ?Anti-infectives (From admission, onward)  ? ? None  ? ?  ? ? ?Objective: ?Vitals:  ? 07/25/21 0737 07/25/21 0818  ?BP: 127/79   ?Pulse: 83   ?Resp: 17   ?Temp: 98.2 ?F (36.8 ?C)   ?SpO2: 96% 96%  ? ?No intake or output data in the 24 hours ending 07/25/21 1312 ?There were no vitals filed for this visit. ?Weight change:  ?There is no height or weight on file to calculate BMI.  ? ?Physical Exam: ?General exam: Pleasant, middle-aged Spanish female.  Not in distress at rest ?Skin:  No rashes, lesions or ulcers. ?HEENT: Atraumatic, normocephalic, no obvious bleeding ?Lungs: Clear to auscultation bilaterally ?CVS: Regular rate and rhythm, no murmur ?GI/Abd soft, nontender, nondistended, bowel sound present ?CNS: Alert, awake, oriented x3 ?Psychiatry: Mood appropriate ?Extremities: No pedal edema, no calf tenderness ? ?Data Review: I have personally reviewed the laboratory data and studies available. ? ?F/u labs ordered ?Unresulted Labs (From admission, onward)  ? ?  Start     Ordered  ? 07/25/21 8022  Basic metabolic panel  Daily,   R     ? 07/24/21 1731  ? 07/25/21 0500  CBC  Daily,   R     ? 07/24/21 1731  ? ?  ?  ? ?  ? ? ?Signed, ?Terrilee Croak, MD ?[facility] ?07/25/2021 ? ? ? ? ? ? ? ? ? ? ?  ?

## 2021-07-25 NOTE — Discharge Planning (Signed)
Oncology Discharge Planning Admission Note  [facility] Address: [address], [place], [place] [address] Hours of Operation:  8am - 5pm, Monday - Friday  Clinic Contact Information:  [phone]) [phone]  Oncology Care Team: Medical Oncologist:  Dr. Salvatore Marvel - NP is aware of this hospital admission dated 07/24/21.  The cancer center will follow [facility] inpatient care to assist with discharge planning as indicated by the oncologist.  We will arrange outpatient follow up closer to discharge.  Disclaimer:  This [place] note does not imply a formal consult request has been made by the admitting attending for this admission or there will be an inpatient consult completed by oncology.  Please request oncology consults as per standard process as indicated.

## 2021-07-25 NOTE — Consult Note (Signed)
Reason for Consult:T12 Compression fracture ?Referring Physician: Terrilee Croak, MD  ? ?HPI: Denise Walls is a 61 y.o. Spanish speaking female with a PmHx significant for B cell lymphoma (treated with chemotherapy in 2020), Crohn's disease, prediabetes, HTN, HLD, arthritis, anxiety, insomnia, migraine, and uterine fibroid. A Spanish to [place] translator was used during this encounter. The patient's son was also at bedside. The patient had been doing well and was in her normal state of health until December of 2022 when she began having complaints of low back pain. Her low back pain was initially thought to be due to spondylosis, however, her symptoms continued to progressively worsen. Her pain initially started on the left and then progressed into the right side. About 2 weeks ago, she reports that her pain severity increased so drastically, that she was unable to ambulate or move without pain. Due to the worsening symptoms, the patient's PCP sent her for an MRI. The MRI revealed a pathological fracture in T12. Her PCP then instructed her to go to the ED at [facility]. While in the ED, NSX was consulted and requested the patient be transferred to [facility] for further evaluation. She denies any numbness and tingling, bowel or bladder changes, and weakness.  ? ? ?Past Medical History:  ?Diagnosis Date  ? Acute cystitis   ? Anxiety   ? Arthritis   ? Cancer [facility])   ? Crohn disease ([place])   ? dx 2004, history of small bowel obstruction 02/2007 treated conservatively  ? History of colon polyps   ? History of shingles   ? HTN (hypertension)   ? Hypercholesterolemia   ? Insomnia   ? Migraine   ? Uterine fibroid   ? ? ?Past Surgical History:  ?Procedure Laterality Date  ? COLONOSCOPY  09/11/2014  ? Small internal hemorrhoids. Otherwise normal colonoscopy to terminal ileum  ? COLONOSCOPY  10/02/2017  ? ESOPHAGOGASTRODUODENOSCOPY  04/05/2007  ? Mild gastritis. Otherwise, normal esophagogastroduodenoscopy  ?  IR IMAGING GUIDED PORT INSERTION  11/29/2018  ? IR REMOVAL TUN ACCESS W/ PORT W/O FL MOD SED  02/25/2021  ? LYMPH NODE BIOPSY Left 12/09/2018  ? Procedure: LEFT DEEP CERVICAL LYMPH NODE BIOPSY;  Surgeon: Fanny Skates, MD;  Location: [place];  Service: General;  Laterality: Left;  ? TUBAL LIGATION    ? ? ?Family History  ?Problem Relation Age of Onset  ? Heart disease Father   ? Arthritis Sister   ? Heart disease Sister   ? Colon cancer Neg Hx   ? ? ?Social History:  reports that she has never smoked. She has never used smokeless tobacco. She reports that she does not drink alcohol and does not use drugs. ? ?Allergies:  ?Allergies  ?Allergen Reactions  ? Nsaids Anaphylaxis and Other (See Comments)  ?  Abdominal pain, GI Bleeding ?  ? ? ?Medications: I have reviewed the patient's current medications. ? ?Results for orders placed or performed during the hospital encounter of 07/24/21 (from the past 48 hour(s))  ?HIV Antibody (routine testing w rflx)     Status: None  ? Collection Time: 07/25/21  1:48 AM  ?Result Value Ref Range  ? HIV Screen 4th Generation wRfx Non Reactive Non Reactive  ?  Comment: Performed at [facility], [place] [address] [place], [place] [address]  ?Basic metabolic panel     Status: Abnormal  ? Collection Time: 07/25/21  1:48 AM  ?Result Value Ref Range  ? Sodium 138 135 - 145  mmol/L  ? Potassium 3.7 3.5 - 5.1 mmol/L  ? Chloride 104 98 - 111 mmol/L  ? CO2 23 22 - 32 mmol/L  ? Glucose, Bld 91 70 - 99 mg/dL  ?  Comment: Glucose reference range applies only to samples taken after fasting for at least 8 hours.  ? BUN 25 (H) 8 - 23 mg/dL  ? Creatinine, Ser 0.79 0.44 - 1.00 mg/dL  ? Calcium 9.1 8.9 - 10.3 mg/dL  ? GFR, Estimated >60 >60 mL/min  ?  Comment: (NOTE) ?Calculated using the CKD-EPI Creatinine Equation (2021) ?  ? Anion gap 11 5 - 15  ?  Comment: Performed at [facility], [place] [address], [place], [place] [address]  ?CBC     Status: None  ? Collection Time: 07/25/21  1:48 AM  ?Result  Value Ref Range  ? WBC 7.3 4.0 - 10.5 K/uL  ? RBC 4.70 3.87 - 5.11 MIL/uL  ? Hemoglobin 13.9 12.0 - 15.0 g/dL  ? HCT 41.7 36.0 - 46.0 %  ? MCV 88.7 80.0 - 100.0 fL  ? MCH 29.6 26.0 - 34.0 pg  ? MCHC 33.3 30.0 - 36.0 g/dL  ? RDW 12.8 11.5 - 15.5 %  ? Platelets 310 150 - 400 K/uL  ? nRBC 0.0 0.0 - 0.2 %  ?  Comment: Performed at [facility], [place] [address] [place], [place] [address]  ? ? ?No results found. ? ?ROS: Per HPI ?Blood pressure 127/79, pulse 83, temperature 98.2 ?F (36.8 ?C), temperature source Oral, resp. rate 17, SpO2 96 %. ?Physical Exam: Patient is awake, A/O X 4, conversant, and in good spirits. They are in NAD and VSS. Speech is fluent and appropriate. MAEW with good strength that is symmetric bilaterally.  BUE 5/5 throughout, BLE 5/5 throughout. Stregth testing slightly limited due to pain. Sensation to light touch is intact. PERLA, EOMI. CNs grossly intact.  ? ? ? ?Assessment/Plan: ?61 y.o. female with a PmHx significant for B cell lymphoma presented to the ED with approximately three-month history of low back pain that has progressed in severity and is affecting the patient's ability to ambulate and mobilize. Her exam reveals full strength without any deficits. The patient has a pathological fracture at T12. I would recommend IR biopsy of T12. Pain control per primary.  ? ?Marvis Moeller, DNP, NP-C ?07/25/2021, 11:40 AM  ? ? ? ? ?

## 2021-07-25 NOTE — Progress Notes (Addendum)
HEMATOLOGY-ONCOLOGY PROGRESS NOTE  ASSESSMENT AND PLAN: 1. Stage IV DLBCL, GCB subtype; double hit lymphoma -S/p 1 cycle  of R-CHOP and 5 cycles of da EPOCH-R and IT MTX x 4 for CNS prophylaxis -FISH  on 01/03/2019 and were positive for Myc and BCL2 rearrangement   02/14/2019 PET Scan Whole Body ([id number]) revealed "1. Findings favor complete metabolic response. No residual hypermetabolism within the lymph nodes of the neck, chest, abdomen or pelvis, which have all decreased in size in the interval. 2. Nonspecific new diffuse skeletal hypermetabolism and new mild splenic hypermetabolism, favor reactive state of the marrow and reticuloendothelial system. Spleen is normal size. Continued surveillance with CT or PET-CT advised. 3. Small layering left pleural effusion, decreased. 4.  Aortic Atherosclerosis (ICD10-I70.0)."   -Completed 5 cycles of R-EPOCH on 04/04/2019, 6 total cycles of chemotherapy (1st cycle was R-CHOP) -Completed 4 cycles of IT METHOTREXATE on 04/25/2019   05/23/2019 PET/CT scan ([id number]) revealed "Small calcified jejunal mesentery nodes, compatible with treated lymphoma. Deauville category 2. No findings suspicious for active lymphoma. Prior splenic and osseous hypermetabolism is no longer evident."  07/06/2020 CT chest/abdomen/pelvis with contrast revealed "1. No evidence of recurrent disease in the chest, abdomen or pelvis.2. Post treatment changes in the chest and abdomen as described.3. Subtle sclerosis at L2 unchanged since January of 2020, may also be indicative of treated disease and did not show uptake on prior PET imaging, attention on follow-up. 4. Hepatic steatosis. 5. Aortic atherosclerosis."  2.  T12 compression fracture   PLAN:  -Labs from today have been reviewed.  Her CBC is completely normal.  BUN mildly elevated at 25 but otherwise her chemistry is normal. -She has developed a T12 compression fracture.  Unclear if related to malignancy.  She is about a year  and a half out from her lymphoma treatment and has less than 10% chance of lymphoma recurrence.  More standpoint, we would recommend a PET scan but that is not possible inpatient.  Therefore, would recommend CT neck/chest/abdomen/pelvis to evaluate for other evidence of lymphoma recurrence. -She will require a biopsy if the only site of concern is the T12 vertebral says she also has a high likelihood of osteoporotic compression fracture given multiple risk factors. Sometimes with high-grade lymphomas the recurrent lymphoma tends to be a low-grade competent and is managed differently and she certainly would need a tissue diagnosis prior to any interventions. -Continue pain control per primary team. -We will follow-up on CT scan results.  Mikey Bussing, DNP, AGPCNP-BC, AOCNP  SUBJECTIVE: The patient is followed by our office for a history of stage IV double hit lymphoma.  He completed chemotherapy November 2022.  Last seen in our office in January 2023.  At that visit, she had no lab or clinical findings to suggest disease recurrence.  Presented to the emergency department with back pain.  She had undergone an MRI at [facility] which showed a pathologic fracture at T12.  She tells me that she has been having back pain since December.  Pain has been worsening.  Otherwise, she has been feeling well at home.  She is not having any loss of appetite, weight loss, fevers, chills, night sweats, chest pain, shortness of breath, lymphadenopathy.  She has been trying to stay active around her house and walks on a regular basis.  Oncology History  DLBCL (diffuse large B cell lymphoma) ([place])  10/29/2018 Imaging   CT abdomen/pelvis: IMPRESSION: Abdominopelvic lymphadenopathy, including a dominant 3.2 cm short axis jejunal mesentery nodal mass  encasing the SMA, partially necrotic. Overall appearance favors lymphoma, although nodal metastases are also possible.   Spleen is normal in size. However,  hypoenhancing lesions are suspected on the portal venous phase, raising the possibility of lymphomatous involvement.   Suspected subcarinal nodal mass, incompletely evaluated. Consider CT chest or PET-CT for further evaluation.   Moderate left pleural effusion. Associated left lower lobe opacity, likely atelectasis.   11/10/2018 Imaging   PET: IMPRESSION: 1. 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 2. Nodal metastasis include the lower neck, mediastinum, mesentery, peritoneum and retroperitoneum, and iliac lymph nodes. 3. Spleen and bone marrow normal. 4. Moderate LEFT pleural effusion. 5. Target lymph nodes for sampling could include the LEFT external iliac lymph node, precordial lymph node in the LEFT upper quadrant, or super clavicular/LEFT sub pectoralis nodes.   12/03/2018 Bone Marrow Biopsy   Bone Marrow, Aspirate,Biopsy, and Clot, left posterior iliac crest - NORMOCELLULAR MARROW WITH FOCAL SMALL LYMPHOID AGGREGATES. - SEE COMMENT. PERIPHERAL BLOOD: - BORDERLINE NORMOCYTIC ANEMIA. Diagnosis Note The marrow has only a few small lymphoid aggregates which are not overtly atypical and contain a mixture of B-cells and T-cells. Flow cytometry is negative for a monoclonal B-cell population. These findings are not diagnostic of a lymphoproliferative process.  Accession: [id number] Bone Marrow Flow Cytometry - NO MONOCLONAL B-CELL OR PHENOTYPICALLY ABERRANT T-CELL POPULATION IDENTIFIED.   12/03/2018 Imaging   TTE:  1. The left ventricle has normal systolic function with an ejection fraction of 60-65%. The cavity size was normal. Left ventricular diastolic Doppler parameters are consistent with impaired relaxation.  2. The right ventricle has normal systolic function. The cavity was normal. There is no increase in right ventricular wall thickness.  3. Large pleural effusion.  4. Clinical correlation suggested.  5. The mitral valve is  grossly normal.  6. The tricuspid valve is grossly normal.  7. The aortic valve is grossly normal. Aortic valve regurgitation was not assessed by color flow Doppler.  8. The aortic root is normal in size and structure.   12/08/2018 Procedure   Left thoracentesis   12/08/2018 Pathology Results   Diagnosis PLEURAL FLUID, LEFT (SPECIMEN 1 OF 1 COLLECTED 12/08/18): - B-CELL LYMPHOMA - SEE COMMENT   12/09/2018 Procedure   Left supraclavicular LN bx    12/09/2018 Pathology Results   Accession: [id number] Lymph node for lymphoma, left deep cervical - DIFFUSE LARGE B-CELL LYMPHOMA - SEE COMMENT   12/21/2018 - 12/23/2018 Chemotherapy   The patient had DOXOrubicin (ADRIAMYCIN) chemo injection 80 mg, 50 mg/m2 = 80 mg, Intravenous,  Once, 1 of 6 cycles Administration: 80 mg (12/21/2018) palonosetron (ALOXI) injection 0.25 mg, 0.25 mg, Intravenous,  Once, 1 of 6 cycles Administration: 0.25 mg (12/21/2018) pegfilgrastim-cbqv (UDENYCA) injection 6 mg, 6 mg, Subcutaneous, Once, 1 of 6 cycles Administration: 6 mg (12/23/2018) vinCRIStine (ONCOVIN) 2 mg in sodium chloride 0.9 % 50 mL chemo infusion, 2 mg, Intravenous,  Once, 1 of 6 cycles Administration: 2 mg (12/21/2018) riTUXimab (RITUXAN) 600 mg in sodium chloride 0.9 % 250 mL (1.9355 mg/mL) infusion, 375 mg/m2 = 600 mg, Intravenous,  Once, 1 of 1 cycle Administration: 600 mg (12/21/2018) cyclophosphamide (CYTOXAN) 1,200 mg in sodium chloride 0.9 % 250 mL chemo infusion, 750 mg/m2 = 1,200 mg, Intravenous,  Once, 1 of 6 cycles Administration: 1,200 mg (12/21/2018) riTUXimab (RITUXAN) 600 mg in sodium chloride 0.9 % 190 mL infusion, 375 mg/m2 = 600 mg (100 % of original dose 375 mg/m2), Intravenous,  Once, 0 of 5 cycles Dose modification: 375  mg/m2 (original dose 375 mg/m2, Cycle 2, Reason: Provider Judgment, Comment: VO to change to RIR) fosaprepitant (EMEND) 150 mg, dexamethasone (DECADRON) 12 mg in sodium chloride 0.9 % 145 mL IVPB, , Intravenous,  Once, 1 of 6  cycles Administration:  (12/21/2018)   for chemotherapy treatment.     01/10/2019 -  Chemotherapy   The patient had dexamethasone (DECADRON) tablet 10 mg, 10 mg (100 % of original dose 10 mg), Oral,  Once, 1 of 1 cycle Dose modification: 10 mg (original dose 10 mg, Cycle 6) Administration: 10 mg (04/27/2019) dexamethasone (DECADRON) 4 MG tablet, 8 mg, Oral, 2 times daily with meals, 1 of 1 cycle, Start date: --, End date: -- DOXOrubicin (ADRIAMYCIN) 16 mg, etoposide (VEPESID) 80 mg, vinCRIStine (ONCOVIN) 0.6 mg in sodium chloride 0.9 % 1,000 mL chemo infusion, , Intravenous, Once, 5 of 5 cycles Administration:  (01/10/2019),  (01/11/2019),  (01/31/2019),  (02/01/2019),  (01/12/2019),  (01/13/2019),  (02/02/2019),  (02/03/2019),  (02/21/2019),  (02/22/2019),  (04/04/2019),  (04/05/2019),  (04/06/2019),  (04/07/2019),  (02/23/2019),  (02/24/2019),  (03/14/2019),  (03/15/2019),  (03/16/2019),  (03/17/2019) ondansetron (ZOFRAN) 8 mg, dexamethasone (DECADRON) 10 mg in sodium chloride 0.9 % 50 mL IVPB, , Intravenous,  Once, 5 of 5 cycles Administration: 18 mg (01/10/2019), 8 mg (01/11/2019), 36 mg (01/14/2019), 8 mg (01/31/2019), 18 mg (02/01/2019), 36 mg (02/04/2019), 18 mg (01/12/2019), 18 mg (01/13/2019), 18 mg (02/02/2019), 18 mg (02/03/2019), 18 mg (02/21/2019), 8 mg (02/22/2019), 36 mg (02/25/2019), 18 mg (04/04/2019), 18 mg (04/05/2019), 8 mg (04/06/2019), 18 mg (04/07/2019), 36 mg (04/08/2019), 8 mg (02/23/2019), 8 mg (02/24/2019), 8 mg (03/14/2019), 18 mg (03/15/2019), 36 mg (03/18/2019), 8 mg (03/16/2019), 18 mg (03/17/2019) methotrexate (PF) 12 mg, hydrocortisone sodium succinate (SOLU-CORTEF) 50 mg in sodium chloride (PF) 0.9 % INTRATHECAL chemo injection, , Intrathecal,  Once, 4 of 4 cycles Administration:  (02/23/2019),  (03/15/2019),  (04/27/2019) cyclophosphamide (CYTOXAN) 1,180 mg in sodium chloride 0.9 % 250 mL chemo infusion, 750 mg/m2 = 1,180 mg, Intravenous,  Once, 5 of 5 cycles Administration: 1,180 mg (01/14/2019),  1,180 mg (02/04/2019), 1,180 mg (02/25/2019), 1,180 mg (04/08/2019), 1,180 mg (03/18/2019) ondansetron (ZOFRAN) 8 mg, dexamethasone (DECADRON) 10 mg in sodium chloride 0.9 % 50 mL IVPB, , Intravenous,  Once, 1 of 1 cycle   for chemotherapy treatment.     01/17/2019 -  Chemotherapy   The patient had pegfilgrastim-cbqv (UDENYCA) injection 6 mg, 6 mg, Subcutaneous, Once, 5 of 5 cycles Administration: 6 mg (01/17/2019), 6 mg (02/07/2019), 6 mg (02/28/2019), 6 mg (03/21/2019), 6 mg (04/11/2019) riTUXimab (RITUXAN) 600 mg in sodium chloride 0.9 % 250 mL (1.9355 mg/mL) infusion, 375 mg/m2 = 600 mg, Intravenous,  Once, 3 of 3 cycles Administration: 600 mg (02/07/2019) riTUXimab (RITUXAN) 600 mg in sodium chloride 0.9 % 190 mL infusion, 375 mg/m2 = 600 mg, Intravenous,  Once, 3 of 3 cycles Administration: 600 mg (02/28/2019), 600 mg (03/21/2019), 600 mg (04/11/2019)   for chemotherapy treatment.        REVIEW OF SYSTEMS:   Review of Systems  Constitutional:  Negative for chills, fever and weight loss.  HENT: Negative.    Eyes: Negative.   Respiratory: Negative.    Cardiovascular: Negative.   Gastrointestinal: Negative.   Musculoskeletal:  Positive for back pain.  Skin: Negative.   Neurological: Negative.   Endo/Heme/Allergies: Negative.   Psychiatric/Behavioral: Negative.     I have reviewed the past medical history, past surgical history, social history and family history with the patient and they are unchanged  from previous note.   PHYSICAL EXAMINATION: ECOG PERFORMANCE STATUS: 1 - Symptomatic but completely ambulatory  Vitals:   07/25/21 0737 07/25/21 0818  BP: 127/79   Pulse: 83   Resp: 17   Temp: 98.2 F (36.8 C)   SpO2: 96% 96%   There were no vitals filed for this visit.  Intake/Output from previous day: No intake/output data recorded.  Physical Exam Vitals reviewed.  Constitutional:      General: She is not in acute distress. HENT:     Head: Normocephalic.      Mouth/Throat:     Pharynx: No oropharyngeal exudate or posterior oropharyngeal erythema.  Eyes:     General: No scleral icterus.    Conjunctiva/sclera: Conjunctivae normal.  Cardiovascular:     Rate and Rhythm: Normal rate and regular rhythm.  Pulmonary:     Effort: Pulmonary effort is normal.     Breath sounds: Normal breath sounds.  Abdominal:     General: Bowel sounds are normal.     Palpations: Abdomen is soft.     Tenderness: There is no abdominal tenderness.  Lymphadenopathy:     Cervical: No cervical adenopathy.     Upper Body:     Right upper body: No supraclavicular adenopathy.     Left upper body: No supraclavicular adenopathy.     Lower Body: No right inguinal adenopathy. No left inguinal adenopathy.  Skin:    General: Skin is warm and dry.  Neurological:     Mental Status: She is alert and oriented to person, place, and time.    LABORATORY DATA:  I have reviewed the data as listed CMP Latest Ref Rng & Units 07/25/2021 06/07/2021 02/05/2021  Glucose 70 - 99 mg/dL 91 97 86  BUN 8 - 23 mg/dL 25(H) 14 14  Creatinine 0.44 - 1.00 mg/dL 0.79 0.75 0.81  Sodium 135 - 145 mmol/L 138 139 139  Potassium 3.5 - 5.1 mmol/L 3.7 3.7 4.1  Chloride 98 - 111 mmol/L 104 104 105  CO2 22 - 32 mmol/L 23 27 25   Calcium 8.9 - 10.3 mg/dL 9.1 9.2 9.1  Total Protein 6.5 - 8.1 g/dL - 7.3 7.3  Total Bilirubin 0.3 - 1.2 mg/dL - 0.4 0.6  Alkaline Phos 38 - 126 U/L - 92 103  AST 15 - 41 U/L - 20 22  ALT 0 - 44 U/L - 20 22    Lab Results  Component Value Date   WBC 7.3 07/25/2021   HGB 13.9 07/25/2021   HCT 41.7 07/25/2021   MCV 88.7 07/25/2021   PLT 310 07/25/2021   NEUTROABS 3.2 06/07/2021    No results found for: CEA1, CEA, CAN199, CA125, PSA1  No results found.   Future Appointments  Date Time Provider [place]  08/02/2021  3:00 PM Jackquline Denmark, MD [facility]  09/13/2021 12:00 PM [facility] None  09/13/2021 12:40 PM [name], MD  [facility] None      LOS: 1 day    ADDENDUM  .Patient was Personally and independently interviewed, examined and relevant elements of the history of present illness were reviewed in details and an assessment and plan was created. All elements of the patient's history of present illness , assessment and plan were discussed in details with Krisitin Curcio DNP. The above documentation reflects our combined findings assessment and plan.   T12 pathologic compression fracture in a patient with previous history of DOube hit large B cell lymphoma . PLAN -MRI T spine @  [facility]-- images not available for review at this time. -recommend CT neck/Chest/abd/pelvis to evaluate for broader lymphoma recurrence and restaging. -T12 compression fracture -- need for neurosurgical intervention vs IR osteocool ablation + vertebroplasty will depend on fracture site stability. -will need tissue diagnosis prior to specific oncologic recommendations -- Biopsy of T12 if not other site of disease or if T12 intervention intervention indicated anyways for symptom control. -oncology will f/u with tissue diagnosis for additional recommendations.  Sullivan Lone MD MS

## 2021-07-26 ENCOUNTER — Encounter (HOSPITAL_COMMUNITY): Payer: Self-pay | Admitting: Internal Medicine

## 2021-07-26 ENCOUNTER — Inpatient Hospital Stay (HOSPITAL_COMMUNITY): Payer: BC Managed Care – PPO

## 2021-07-26 DIAGNOSIS — Z8572 Personal history of non-Hodgkin lymphomas: Secondary | ICD-10-CM

## 2021-07-26 DIAGNOSIS — S22080A Wedge compression fracture of T11-T12 vertebra, initial encounter for closed fracture: Secondary | ICD-10-CM | POA: Diagnosis not present

## 2021-07-26 LAB — BASIC METABOLIC PANEL
Anion gap: 11 (ref 5–15)
BUN: 18 mg/dL (ref 8–23)
CO2: 23 mmol/L (ref 22–32)
Calcium: 9.4 mg/dL (ref 8.9–10.3)
Chloride: 103 mmol/L (ref 98–111)
Creatinine, Ser: 0.9 mg/dL (ref 0.44–1.00)
GFR, Estimated: >60 mL/min (ref >60)
Glucose, Bld: 113 mg/dL — ABNORMAL HIGH (ref 70–99)
Potassium: 3.9 mmol/L (ref 3.5–5.1)
Sodium: 137 mmol/L (ref 135–145)

## 2021-07-26 LAB — CBC
HCT: 42 % (ref 36.0–46.0)
Hemoglobin: 14.3 g/dL (ref 12.0–15.0)
MCH: 29.4 pg (ref 26.0–34.0)
MCHC: 34 g/dL (ref 30.0–36.0)
MCV: 86.2 fL (ref 80.0–100.0)
Platelets: 309 10*3/uL (ref 150–400)
RBC: 4.87 MIL/uL (ref 3.87–5.11)
RDW: 12.8 % (ref 11.5–15.5)
WBC: 5.8 10*3/uL (ref 4.0–10.5)
nRBC: 0 % (ref 0.0–0.2)

## 2021-07-26 MED ORDER — IOHEXOL 300 MG/ML  SOLN
150.0000 mL | Freq: Once | INTRAMUSCULAR | Status: AC | PRN
  Administered 2021-07-26: 150 mL via INTRAVENOUS

## 2021-07-26 MED ORDER — DEXAMETHASONE SODIUM PHOSPHATE 10 MG/ML IJ SOLN
4.0000 mg | Freq: Four times a day (QID) | INTRAMUSCULAR | Status: DC
  Administered 2021-07-26 – 2021-08-01 (×25): 4 mg via INTRAVENOUS
  Filled 2021-07-26 (×25): qty 1

## 2021-07-26 MED ORDER — MELATONIN 3 MG PO TABS
3.0000 mg | ORAL_TABLET | Freq: Every day | ORAL | Status: DC
  Administered 2021-07-26 – 2021-07-31 (×6): 3 mg via ORAL
  Filled 2021-07-26 (×6): qty 1

## 2021-07-26 NOTE — Progress Notes (Signed)
[facility]  HEMATOLOGY/ONCOLOGY INPATIENT PROGRESS NOTE  Date of Service: 07/26/2021  Inpatient Attending: .Terrilee Croak, MD   SUBJECTIVE  Patient was seen in oncologic followup today. She notes he pain is reasonably controlled when resting in bed. CT neck/CAP showed no LNadenopathy or hepatosplenomegaly concerning for lymphoma recurrence. T12 compression fracture with no overt osseous lesion. Awaiting T12 biopsy with kyphoplasty possibly today or Monday. No fevers/chills/night sweats or other new focal symptoms.   OBJECTIVE:  NAD  PHYSICAL EXAMINATION: . Vitals:   07/25/21 2004 07/26/21 0452 07/26/21 0745 07/26/21 1342  BP: 122/72 114/83 120/74 120/85  Pulse: 81 87 83 86  Resp: 17 14 18 18   Temp: 98 F (36.7 C) 98.4 F (36.9 C) 98.7 F (37.1 C) 98.1 F (36.7 C)  TempSrc: Oral  Oral Oral  SpO2: 99% 97% 97% 97%   There were no vitals filed for this visit. .There is no height or weight on file to calculate BMI.  GENERAL:alert, in no acute distress and comfortable SKIN: skin color, texture, turgor are normal, no rashes or significant lesions EYES: normal, conjunctiva are pink and non-injected, sclera clear OROPHARYNX:no exudate, no erythema and lips, buccal mucosa, and tongue normal  NECK: supple, no JVD, thyroid normal size, non-tender, without nodularity LYMPH:  no palpable lymphadenopathy in the cervical, axillary or inguinal LUNGS: clear to auscultation with normal respiratory effort HEART: regular rate & rhythm,  no murmurs and no lower extremity edema ABDOMEN: abdomen soft, non-tender, normoactive bowel sounds  Musculoskeletal: no cyanosis of digits and no clubbing  PSYCH: alert & oriented x 3 with fluent speech NEURO: no focal motor/sensory deficits  MEDICAL HISTORY:  Past Medical History:  Diagnosis Date   Acute cystitis    Anxiety    Arthritis    Cancer ([place])    Crohn disease ([place])    dx 2004, history of small bowel obstruction 02/2007 treated conservatively    History of colon polyps    History of shingles    HTN (hypertension)    Hypercholesterolemia    Insomnia    Migraine    Uterine fibroid     SURGICAL HISTORY: Past Surgical History:  Procedure Laterality Date   COLONOSCOPY  09/11/2014   Small internal hemorrhoids. Otherwise normal colonoscopy to terminal ileum   COLONOSCOPY  10/02/2017   ESOPHAGOGASTRODUODENOSCOPY  04/05/2007   Mild gastritis. Otherwise, normal esophagogastroduodenoscopy   IR IMAGING GUIDED PORT INSERTION  11/29/2018   IR REMOVAL TUN ACCESS W/ PORT W/O FL MOD SED  02/25/2021   LYMPH NODE BIOPSY Left 12/09/2018   Procedure: LEFT DEEP CERVICAL LYMPH NODE BIOPSY;  Surgeon: Fanny Skates, MD;  Location: [facility];  Service: General;  Laterality: Left;   TUBAL LIGATION      SOCIAL HISTORY: Social History   Socioeconomic History   Marital status: Married    Spouse name: Not on file   Number of children: Not on file   Years of education: Not on file   Highest education level: 6th grade  Occupational History   Not on file  Tobacco Use   Smoking status: Never   Smokeless tobacco: Never  Vaping Use   Vaping Use: Never used  Substance and Sexual Activity   Alcohol use: Never   Drug use: Never   Sexual activity: Not Currently    Birth control/protection: Post-menopausal  Other Topics Concern   Not on file  Social History Narrative   Not on file   Social Determinants of Health   Financial Resource Strain: Not on  file  Food Insecurity: Not on file  Transportation Needs: No Transportation Needs   Lack of Transportation (Medical): No   Lack of Transportation (Non-Medical): No  Physical Activity: Not on file  Stress: Not on file  Social Connections: Not on file  Intimate Partner Violence: Not on file    FAMILY HISTORY: Family History  Problem Relation Age of Onset   Heart disease Father    Arthritis Sister    Heart disease Sister    Colon cancer Neg Hx     ALLERGIES:  is allergic to  nsaids.  MEDICATIONS:  Scheduled Meds:  amLODipine  2.5 mg Oral Daily   dexamethasone (DECADRON) injection  4 mg Intravenous Q6H   enoxaparin (LOVENOX) injection  40 mg Subcutaneous Q24H   mesalamine  2.4 g Oral Daily   senna  1 tablet Oral BID   venlafaxine XR  37.5 mg Oral Q breakfast   Continuous Infusions: PRN Meds:.acetaminophen **OR** acetaminophen, albuterol, bisacodyl, hydrALAZINE, morphine injection, ondansetron **OR** ondansetron (ZOFRAN) IV, oxyCODONE, polyethylene glycol, traMADol  REVIEW OF SYSTEMS:    10 Point review of Systems was done is negative except as noted above.   LABORATORY DATA:  I have reviewed the data as listed  . CBC Latest Ref Rng & Units 07/26/2021 07/25/2021 06/07/2021  WBC 4.0 - 10.5 K/uL 5.8 7.3 6.0  Hemoglobin 12.0 - 15.0 g/dL 14.3 13.9 13.0  Hematocrit 36.0 - 46.0 % 42.0 41.7 39.5  Platelets 150 - 400 K/uL 309 310 259    . CMP Latest Ref Rng & Units 07/26/2021 07/25/2021 06/07/2021  Glucose 70 - 99 mg/dL 113(H) 91 97  BUN 8 - 23 mg/dL 18 25(H) 14  Creatinine 0.44 - 1.00 mg/dL 0.90 0.79 0.75  Sodium 135 - 145 mmol/L 137 138 139  Potassium 3.5 - 5.1 mmol/L 3.9 3.7 3.7  Chloride 98 - 111 mmol/L 103 104 104  CO2 22 - 32 mmol/L 23 23 27   Calcium 8.9 - 10.3 mg/dL 9.4 9.1 9.2  Total Protein 6.5 - 8.1 g/dL - - 7.3  Total Bilirubin 0.3 - 1.2 mg/dL - - 0.4  Alkaline Phos 38 - 126 U/L - - 92  AST 15 - 41 U/L - - 20  ALT 0 - 44 U/L - - 20   . Lab Results  Component Value Date   LDH 237 (H) 06/07/2021     RADIOGRAPHIC STUDIES: I have personally reviewed the radiological images as listed and agreed with the findings in the report. CT SOFT TISSUE NECK W CONTRAST  Result Date: 07/26/2021 CLINICAL DATA:  Initial evaluation for lymphoma recurrence. EXAM: CT NECK WITH CONTRAST TECHNIQUE: Multidetector CT imaging of the neck was performed using the standard protocol following the bolus administration of intravenous contrast. RADIATION DOSE REDUCTION:  This exam was performed according to the departmental dose-optimization program which includes automated exposure control, adjustment of the mA and/or kV according to patient size and/or use of iterative reconstruction technique. CONTRAST:  136m OMNIPAQUE IOHEXOL 300 MG/ML  SOLN COMPARISON:  Prior PET-CT from 05/23/2019. FINDINGS: Pharynx and larynx: Oral cavity within normal limits. No acute inflammatory changes about the dentition. Oropharynx and nasopharynx within normal limits. No retropharyngeal collection or swelling. Negative epiglottis. Hypopharynx and supraglottic larynx normal. Negative glottis. Subglottic airway clear. Salivary glands: Salivary glands including the parotid and submandibular glands are within normal limits. Thyroid: Normal. Lymph nodes: No enlarged or pathologic adenopathy within the neck. Vascular: Normal intravascular enhancement seen throughout the neck. Limited intracranial: Unremarkable. Visualized orbits: Unremarkable.  Mastoids and visualized paranasal sinuses: Mild mucoperiosteal thickening within the ethmoidal air cells and maxillary sinuses. Visualized paranasal sinuses are otherwise clear. Visualized mastoids and middle ear cavities are well pneumatized and free of fluid. Skeleton: Mild spondylosis for age. No worrisome lytic or blastic osseous lesions. Upper chest: Visualized lung apices are largely clear. Visualized upper chest demonstrates no other acute or significant finding. Other: None. IMPRESSION: Normal CT of the neck. No adenopathy or other findings to suggest recurrent lymphoma. Electronically Signed   By: Jeannine Boga M.D.   On: 07/26/2021 02:39   CT CHEST ABDOMEN PELVIS W CONTRAST  Result Date: 07/26/2021 CLINICAL DATA:  History of lymphoma, diffuse large B-cell, follow-up/surveillance EXAM: CT CHEST, ABDOMEN, AND PELVIS WITH CONTRAST TECHNIQUE: Multidetector CT imaging of the chest, abdomen and pelvis was performed following the standard protocol during  bolus administration of intravenous contrast. RADIATION DOSE REDUCTION: This exam was performed according to the departmental dose-optimization program which includes automated exposure control, adjustment of the mA and/or kV according to patient size and/or use of iterative reconstruction technique. CONTRAST:  146m OMNIPAQUE IOHEXOL 300 MG/ML  SOLN COMPARISON:  Multiple priors including most recent CT July 02, 2020 FINDINGS: CT CHEST FINDINGS Cardiovascular: Aortic atherosclerosis without aneurysmal dilation. No central pulmonary embolus on this nondedicated study. Mild cardiac enlargement. Small pericardial effusion is slightly increased from prior. Mediastinum/Nodes: No supraclavicular adenopathy. No discrete thyroid nodularity. No pathologically enlarged mediastinal, hilar or axillary lymph nodes. Previously indexed partially calcified subcarinal lymph node measures 5 mm on image 51/6 previously 7 mm. Esophagus is grossly unremarkable. Lungs/Pleura: Linear atelectasis versus scarring in the bilateral lung bases. No suspicious pulmonary nodules or masses. No pleural effusion or pneumothorax. Musculoskeletal: No aggressive lytic or blastic lesion of bone. Thoracic spondylosis with bridging lower thoracic anterior vertebral osteophytes. CT ABDOMEN PELVIS FINDINGS Hepatobiliary: Hepatic steatosis. No suspicious hepatic lesion. Gallbladder is distended without wall thickening or pericholecystic fluid. No biliary ductal dilation. Pancreas: No pancreatic ductal dilation or evidence of acute inflammation. Spleen: No splenomegaly or focal splenic lesion. Adrenals/Urinary Tract: Bilateral adrenal glands appear normal. No hydronephrosis. Symmetric bilateral renal enhancement and excretion of contrast material. Urinary bladder is distended with urine. Stomach/Bowel: Radiopaque enteric contrast material traverses the descending colon. Stomach is unremarkable for degree of distension. No pathologic dilation of small or  large bowel. Large rectal stool ball with rectal wall thickening and some perirectal stranding. Vascular/Lymphatic: Aortic atherosclerosis without aneurysmal dilation. Similar appearance of the mesenteric stranding. No gastrohepatic or hepatoduodenal ligament lymphadenopathy. No retroperitoneal or mesenteric lymphadenopathy. No pelvic sidewall lymphadenopathy. No groin lymphadenopathy. Reproductive: Uterus and bilateral adnexa are unremarkable. Other: Trace pelvic free fluid. Musculoskeletal: New wedging compression deformity of the T12 vertebral body with approximately 30% height loss, there is subtle sclerosis throughout the T12 vertebral body without discrete osseous lesion, likely reflecting osseous changes related to the compression deformity however lymphomatous disease involvement not entirely excluded. Similar subtle sclerosis of the L2 vertebral body stable dating back to at least January 2020. IMPRESSION: 1. No pathologically enlarged lymph nodes within the chest, abdomen, or pelvis and no splenomegaly. 2. New wedging compression deformity of the T12 vertebral body with subtle sclerosis throughout the T12 vertebral body but without discrete osseous lesion, likely reflecting osseous changes related to the compression deformity. However, lymphomatous disease involvement not entirely excluded. 3. Large rectal stool ball with rectal wall thickening and perirectal stranding, may reflect early changes of stercoral colitis. 4. Trace pelvic free fluid. 5. Hepatic steatosis. 6.  Aortic Atherosclerosis (ICD10-I70.0). Electronically  Signed   By: Dahlia Bailiff M.D.   On: 07/26/2021 07:13    ASSESSMENT & PLAN:   1. H/o Stage IV DLBCL, GCB subtype; double hit lymphoma -S/p 1 cycle  of R-CHOP and 5 cycles of da EPOCH-R and IT MTX x 4 for CNS prophylaxis -FISH  on 01/03/2019 and were positive for Myc and BCL2 rearrangement  2. T12 vertebral pathologic fracture. Less likely related to lymphoma/other maliagnancy.  More likely to be osteoporotic.  PLAN -labs stable- normal CBC and CMP -CT neck/chest/abd/pelvis - with on Lnadenopathy or other findings suggestive of lymphoma recurrence at this time. -patient to have Bone bx of T12 and kyphoplasty for pain control. -if biopsy results show malignancy --will need to evaluated and managed accordingly. -pain mx per hospital medicine -will need outpatient DEXA scan for evaluation of osteoporosis and if present aggressive mx of osteoporosis. -keep [place] levels 60-90   Sullivan Lone MD MS AAHIVMS [facility] [facility] Phys[facility]  (Office):       [phone] (Work cell):  [phone] (Fax):           [phone]  07/26/2021 2:48 PM

## 2021-07-26 NOTE — Progress Notes (Signed)
Mobility Specialist: Progress Note ? ? 07/26/21 1220  ?Mobility  ?Activity Ambulated with assistance in room  ?Level of Assistance Minimal assist, patient does 75% or more  ?Assistive Device  ?(HHA)  ?Distance Ambulated (ft) 2 ft  ?Activity Response Tolerated poorly  ?$Mobility charge 1 Mobility  ? ?Pt received in bed and agreeable to mobility. C/o back pain upon sitting EOB as well as standing. Able to take a couple short steps from the bed and then back, limited d/t pain level. Pt assisted back to bed with call bell and phone in reach. Pt's husband present in the room. Pt is requesting pain medication, RN notified.  ? ?Denise Walls ?Mobility Specialist ?Mobility Specialist [place]: [phone] ?Mobility Specialist [place]: [phone] ? ?

## 2021-07-26 NOTE — Consult Note (Signed)
Chief Complaint: Patient was seen in consultation today for T12 lesion, compression fracture  Referring Physician(s): Dr. Marlowe Aschoff Dahal  Supervising Physician: Juliet Rude  Patient Status: [name] - Out-pt  History of Present Illness: Denise Walls is a 61 y.o. female with past medical history of anxiety, HTN, Crohn's disease, lymphoma who presented to her PCP with back pain, lower extremity weakness.  She underwent outpatient MR scan which showed a pathologic fracture at T12.  She was referred to [facility] for further work-up.  She has been evaluated by Neurosurgery and oncology who recommend biopsy with kyphoplasty and Osteocool ablation. Case reviewed by Dr. Denna Haggard who approves patient for the procedure.  A request has been submitted to her insurance for prior authorization.   PA met with patient and husband with assistance from [employer] interpreter.  Patient recounts her progressive back pain and decline in mobility over the past 3 months. She reports difficulty walking and standing for prolonged periods of time. She is interested in treatments that would alleviate her pain.   Past Medical History:  Diagnosis Date   Acute cystitis    Anxiety    Arthritis    Cancer ([place])    Crohn disease ([place])    dx 2004, history of small bowel obstruction 02/2007 treated conservatively   History of colon polyps    History of shingles    HTN (hypertension)    Hypercholesterolemia    Insomnia    Migraine    Uterine fibroid     Past Surgical History:  Procedure Laterality Date   COLONOSCOPY  09/11/2014   Small internal hemorrhoids. Otherwise normal colonoscopy to terminal ileum   COLONOSCOPY  10/02/2017   ESOPHAGOGASTRODUODENOSCOPY  04/05/2007   Mild gastritis. Otherwise, normal esophagogastroduodenoscopy   IR IMAGING GUIDED PORT INSERTION  11/29/2018   IR REMOVAL TUN ACCESS W/ PORT W/O FL MOD SED  02/25/2021   LYMPH NODE BIOPSY Left 12/09/2018   Procedure: LEFT DEEP CERVICAL LYMPH  NODE BIOPSY;  Surgeon: Fanny Skates, MD;  Location: [place];  Service: General;  Laterality: Left;   TUBAL LIGATION      Allergies: Nsaids  Medications: Prior to Admission medications   Medication Sig Start Date End Date Taking? Authorizing Provider  acetaminophen (TYLENOL) 325 MG tablet Take 325-650 mg by mouth every 6 (six) hours as needed for mild pain.   Yes [provider]  amLODipine (NORVASC) 5 MG tablet TAKE 1 TABLET (5 MG TOTAL) BY MOUTH DAILY. Patient taking differently: Take 5 mg by mouth daily. 12/30/19 07/25/21 Yes Katsadouros, Vasilios, MD  cholecalciferol (VITAMIN D3) 25 MCG (1000 UT) tablet Take 1 tablet (1,000 Units total) by mouth daily. 02/04/19  Yes [name], MD  mesalamine (LIALDA) 1.2 g EC tablet Take 2 tablets (2.4 g total) by mouth daily. 04/17/21  Yes Jackquline Denmark, MD  traMADol (ULTRAM) 50 MG tablet Take 50 mg by mouth every 6 (six) hours as needed for pain. 07/15/21  Yes [provider]  venlafaxine XR (EFFEXOR-XR) 37.5 MG 24 hr capsule 37.5 mg daily with breakfast. 02/24/21  Yes [provider]     Family History  Problem Relation Age of Onset   Heart disease Father    Arthritis Sister    Heart disease Sister    Colon cancer Neg Hx     Social History   Socioeconomic History   Marital status: Married    Spouse name: Not on file   Number of children: Not on file   Years  of education: Not on file   Highest education level: 6th grade  Occupational History   Not on file  Tobacco Use   Smoking status: Never   Smokeless tobacco: Never  Vaping Use   Vaping Use: Never used  Substance and Sexual Activity   Alcohol use: Never   Drug use: Never   Sexual activity: Not Currently    Birth control/protection: Post-menopausal  Other Topics Concern   Not on file  Social History Narrative   Not on file   Social Determinants of Health   Financial Resource Strain: Not on file  Food Insecurity: Not on file  Transportation  Needs: No Transportation Needs   Lack of Transportation (Medical): No   Lack of Transportation (Non-Medical): No  Physical Activity: Not on file  Stress: Not on file  Social Connections: Not on file     Review of Systems: A 12 point ROS discussed and pertinent positives are indicated in the HPI above.  All other systems are negative.  Review of Systems  Constitutional:  Negative for fatigue and fever.  Respiratory:  Negative for cough and shortness of breath.   Cardiovascular:  Negative for chest pain.  Gastrointestinal:  Negative for abdominal pain.  Musculoskeletal:  Negative for back pain.  Psychiatric/Behavioral:  Negative for behavioral problems and confusion.    Vital Signs: BP 120/85 (BP Location: Right Arm)    Pulse 86    Temp 98.1 F (36.7 C) (Oral)    Resp 18    LMP  (LMP Unknown)    SpO2 97%   Physical Exam Vitals and nursing note reviewed.  Constitutional:      General: She is not in acute distress.    Appearance: Normal appearance. She is not ill-appearing.  HENT:     Mouth/Throat:     Mouth: Mucous membranes are moist.     Pharynx: Oropharynx is clear.  Cardiovascular:     Rate and Rhythm: Normal rate and regular rhythm.  Pulmonary:     Effort: Pulmonary effort is normal.     Breath sounds: Normal breath sounds.  Abdominal:     General: Abdomen is flat.     Palpations: Abdomen is soft.  Musculoskeletal:        General: Tenderness (point tenderness in mid back, difficulty shifting in bed) present.  Skin:    General: Skin is warm and dry.  Neurological:     General: No focal deficit present.     Mental Status: She is alert and oriented to person, place, and time. Mental status is at baseline.     MD Evaluation Airway: WNL Heart: WNL Abdomen: WNL Chest/ Lungs: WNL ASA  Classification: 3 Mallampati/Airway Score: Two   Imaging: CT SOFT TISSUE NECK W CONTRAST  Result Date: 07/26/2021 CLINICAL DATA:  Initial evaluation for lymphoma recurrence.  EXAM: CT NECK WITH CONTRAST TECHNIQUE: Multidetector CT imaging of the neck was performed using the standard protocol following the bolus administration of intravenous contrast. RADIATION DOSE REDUCTION: This exam was performed according to the departmental dose-optimization program which includes automated exposure control, adjustment of the mA and/or kV according to patient size and/or use of iterative reconstruction technique. CONTRAST:  139m OMNIPAQUE IOHEXOL 300 MG/ML  SOLN COMPARISON:  Prior PET-CT from 05/23/2019. FINDINGS: Pharynx and larynx: Oral cavity within normal limits. No acute inflammatory changes about the dentition. Oropharynx and nasopharynx within normal limits. No retropharyngeal collection or swelling. Negative epiglottis. Hypopharynx and supraglottic larynx normal. Negative glottis. Subglottic airway clear. Salivary glands: Salivary  glands including the parotid and submandibular glands are within normal limits. Thyroid: Normal. Lymph nodes: No enlarged or pathologic adenopathy within the neck. Vascular: Normal intravascular enhancement seen throughout the neck. Limited intracranial: Unremarkable. Visualized orbits: Unremarkable. Mastoids and visualized paranasal sinuses: Mild mucoperiosteal thickening within the ethmoidal air cells and maxillary sinuses. Visualized paranasal sinuses are otherwise clear. Visualized mastoids and middle ear cavities are well pneumatized and free of fluid. Skeleton: Mild spondylosis for age. No worrisome lytic or blastic osseous lesions. Upper chest: Visualized lung apices are largely clear. Visualized upper chest demonstrates no other acute or significant finding. Other: None. IMPRESSION: Normal CT of the neck. No adenopathy or other findings to suggest recurrent lymphoma. Electronically Signed   By: Jeannine Boga M.D.   On: 07/26/2021 02:39   CT CHEST ABDOMEN PELVIS W CONTRAST  Result Date: 07/26/2021 CLINICAL DATA:  History of lymphoma, diffuse  large B-cell, follow-up/surveillance EXAM: CT CHEST, ABDOMEN, AND PELVIS WITH CONTRAST TECHNIQUE: Multidetector CT imaging of the chest, abdomen and pelvis was performed following the standard protocol during bolus administration of intravenous contrast. RADIATION DOSE REDUCTION: This exam was performed according to the departmental dose-optimization program which includes automated exposure control, adjustment of the mA and/or kV according to patient size and/or use of iterative reconstruction technique. CONTRAST:  112m OMNIPAQUE IOHEXOL 300 MG/ML  SOLN COMPARISON:  Multiple priors including most recent CT July 02, 2020 FINDINGS: CT CHEST FINDINGS Cardiovascular: Aortic atherosclerosis without aneurysmal dilation. No central pulmonary embolus on this nondedicated study. Mild cardiac enlargement. Small pericardial effusion is slightly increased from prior. Mediastinum/Nodes: No supraclavicular adenopathy. No discrete thyroid nodularity. No pathologically enlarged mediastinal, hilar or axillary lymph nodes. Previously indexed partially calcified subcarinal lymph node measures 5 mm on image 51/6 previously 7 mm. Esophagus is grossly unremarkable. Lungs/Pleura: Linear atelectasis versus scarring in the bilateral lung bases. No suspicious pulmonary nodules or masses. No pleural effusion or pneumothorax. Musculoskeletal: No aggressive lytic or blastic lesion of bone. Thoracic spondylosis with bridging lower thoracic anterior vertebral osteophytes. CT ABDOMEN PELVIS FINDINGS Hepatobiliary: Hepatic steatosis. No suspicious hepatic lesion. Gallbladder is distended without wall thickening or pericholecystic fluid. No biliary ductal dilation. Pancreas: No pancreatic ductal dilation or evidence of acute inflammation. Spleen: No splenomegaly or focal splenic lesion. Adrenals/Urinary Tract: Bilateral adrenal glands appear normal. No hydronephrosis. Symmetric bilateral renal enhancement and excretion of contrast material.  Urinary bladder is distended with urine. Stomach/Bowel: Radiopaque enteric contrast material traverses the descending colon. Stomach is unremarkable for degree of distension. No pathologic dilation of small or large bowel. Large rectal stool ball with rectal wall thickening and some perirectal stranding. Vascular/Lymphatic: Aortic atherosclerosis without aneurysmal dilation. Similar appearance of the mesenteric stranding. No gastrohepatic or hepatoduodenal ligament lymphadenopathy. No retroperitoneal or mesenteric lymphadenopathy. No pelvic sidewall lymphadenopathy. No groin lymphadenopathy. Reproductive: Uterus and bilateral adnexa are unremarkable. Other: Trace pelvic free fluid. Musculoskeletal: New wedging compression deformity of the T12 vertebral body with approximately 30% height loss, there is subtle sclerosis throughout the T12 vertebral body without discrete osseous lesion, likely reflecting osseous changes related to the compression deformity however lymphomatous disease involvement not entirely excluded. Similar subtle sclerosis of the L2 vertebral body stable dating back to at least January 2020. IMPRESSION: 1. No pathologically enlarged lymph nodes within the chest, abdomen, or pelvis and no splenomegaly. 2. New wedging compression deformity of the T12 vertebral body with subtle sclerosis throughout the T12 vertebral body but without discrete osseous lesion, likely reflecting osseous changes related to the compression deformity. However, lymphomatous disease  involvement not entirely excluded. 3. Large rectal stool ball with rectal wall thickening and perirectal stranding, may reflect early changes of stercoral colitis. 4. Trace pelvic free fluid. 5. Hepatic steatosis. 6.  Aortic Atherosclerosis (ICD10-I70.0). Electronically Signed   By: Dahlia Bailiff M.D.   On: 07/26/2021 07:13    Labs:  CBC: Recent Labs    02/05/21 0942 06/07/21 1155 07/25/21 0148 07/26/21 0634  WBC 5.4 6.0 7.3 5.8  HGB  13.0 13.0 13.9 14.3  HCT 39.3 39.5 41.7 42.0  PLT 231 259 310 309    COAGS: No results for input(s): INR, APTT in the last 8760 hours.  BMP: Recent Labs    02/05/21 0942 06/07/21 1155 07/25/21 0148 07/26/21 0634  NA 139 139 138 137  K 4.1 3.7 3.7 3.9  CL 105 104 104 103  CO2 25 27 23 23   GLUCOSE 86 97 91 113*  BUN 14 14 25* 18  CALCIUM 9.1 9.2 9.1 9.4  CREATININE 0.81 0.75 0.79 0.90  GFRNONAA >60 >60 >60 >60    LIVER FUNCTION TESTS: Recent Labs    10/12/20 1117 02/05/21 0942 06/07/21 1155  BILITOT 0.4 0.6 0.4  AST 26 22 20   ALT 24 22 20   ALKPHOS 123 103 92  PROT 7.0 7.3 7.3  ALBUMIN 3.8 4.1 4.2    TUMOR MARKERS: No results for input(s): AFPTM, CEA, CA199, CHROMGRNA in the last 8760 hours.  Assessment and Plan: Back pain T12 compression fracture Soft tissue enhancement with spinal canal compression Patient with new, progressive back pain found to have pathology T12 compression fracture with soft tissue lesion causing spinal canal compression.  Request made for biopsy, vertebroplasty/kyphoplasty, as well as Osteocool ablation.  Case reviewed by Dr. Denna Haggard who has approved patient for this procedure.  Request for insurance approval has been submitted and is pending.  Patient may eat today and continue lovenox as ordered.  Procedure to be planned once approval obtained based on MD availability.   Thank you for this interesting consult.  I greatly enjoyed meeting [place] and look forward to participating in their care.  A copy of this report was sent to the requesting provider on this date.  Electronically Signed: Docia Barrier, PA 07/26/2021, 4:28 PM   I spent a total of 40 Minutes    in face to face in clinical consultation, greater than 50% of which was counseling/coordinating care for pathologic T12 compression fracture.

## 2021-07-26 NOTE — Progress Notes (Signed)
?PROGRESS NOTE ? ?[place]  ?DOB: Jul 02, 1960  ?PCP: [name], MD ?WCB:[id number]  ?DOA: 07/24/2021 ? LOS: 2 days  ?Hospital Day: 3 ? ?Brief narrative: ?Mry Doyne Ellinger is a 61 y.o. female with PMH significant for non-Hodgkin's lymphoma, Crohn's disease, prediabetes, HTN, HLD, arthritis, anxiety, insomnia, migraine, uterine fibroid. 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 Since then she is in remission.   ?She was in her usual state of health until December 2022 when she started having lower back pain.  It was initially suspected to be due to osteoarthritis however her symptoms progressed.  For the last 2 weeks, patient was not able to walk and had numbness in bilateral lower extremity. ?She underwent an MRI at [facility] which resulted to show pathological fracture in T12.  PCP directed her to the ED at [facility].  ED physician at [facility] discussed the case with patient's oncologist Dr. Irene Limbo as well as neurosurgeon Dr. Reatha Armour.  Neurosurgeon recommended to transfer the patient to [facility]. ? ?Admitted to hospitalist service ?Neurosurgery and oncology consultation appreciated. ? ?Subjective: ?Patient was seen and examined this morning. ?Pain controlled. ?Lying down in bed.  Not in distress.  Waiting for insurance authorization for T12 biopsy and kyphoplasty.  IR, neurosurgery and oncology following.  ? ?Principal Problem: ?  T12 compression fracture ([place]) ?  ?Assessment and Plan: ?Intractable back pain ?T2 compression fracture ?History of large cell non-Hodgkin's lymphoma ?-Suspicious for pathologic fracture.  Unclear at this time if the fracture is pathological related to any recurrence of lymphoma.   ?-3/9 CT neck, chest abdomen pelvis did not show any large lymph nodes.  It confirmed the T12 compression fracture but could not exclude lymphomatous disease. ?-Neurosurgery and oncology consulted.  As per oncology  recommendation.  I ordered for CT scan of the neck, chest, abdomen and pelvis. ?-May need biopsy by IR ?-Continue pain management. ?  ?Essential hypertension ?-Currently BP controlled on amlodipine 2.5 mg daily ?-Hydralazine as needed ?  ?History of Crohn's disease ?-Continue mesalamine 1.2 g daily as before. ?Follows up with [name] with [facility]. Upcoming GI appointment on 3/17. ? ?Depression ?-Continue Effexor as before. ? ?Stercoral colitis ?-CT abdomen from 3/9 showed large rectal stool ball with rectal wall thickening empirically standing.  Probably due to constipation related to impaired mobility. ?-Continue scheduled Senokot, as needed laxatives.  Encouraged to use as needed laxatives. ?   ?Goals of care ?  Code Status: Full Code  ? ? ?Mobility: Encourage ambulation.  Needs PT eval after biopsy ? ?Nutritional status:  ?There is no height or weight on file to calculate BMI.  ?  ?  ? ? ? ? ?Diet:  ?Diet Order   ? ?       ?  Diet NPO time specified  Diet effective now       ?  ? ?  ?  ? ?  ? ? ?DVT prophylaxis:  ?enoxaparin (LOVENOX) injection 40 mg Start: 07/24/21 2200 ?  ?Antimicrobials: None ?Fluid: None ?Consultants: Neurosurgery, oncology ?Family Communication: Son at bedside today ? ?Status is: Inpatient ? ?Continue in-hospital care because: Pending insurance authorization for T12 biopsy and kyphoplasty ?Level of care: Med-Surg  ? ?Dispo: The patient is from: Home ?             Anticipated d/c is to: Pending clinical course ?  Patient currently is not medically stable to d/c. ?  Difficult to place patient No ? ? ? ? ?Infusions:  ? ? ?Scheduled Meds: ? amLODipine  2.5 mg Oral Daily  ? dexamethasone (DECADRON) injection  4 mg Intravenous Q6H  ? enoxaparin (LOVENOX) injection  40 mg Subcutaneous Q24H  ? mesalamine  2.4 g Oral Daily  ? senna  1 tablet Oral BID  ? venlafaxine XR  37.5 mg Oral Q breakfast  ? ? ?PRN meds: ?acetaminophen **OR** acetaminophen, albuterol, bisacodyl, hydrALAZINE,  morphine injection, ondansetron **OR** ondansetron (ZOFRAN) IV, oxyCODONE, polyethylene glycol, traMADol  ? ?Antimicrobials: ?Anti-infectives (From admission, onward)  ? ? None  ? ?  ? ? ?Objective: ?Vitals:  ? 07/26/21 0452 07/26/21 0745  ?BP: 114/83 120/74  ?Pulse: 87 83  ?Resp: 14 18  ?Temp: 98.4 ?F (36.9 ?C) 98.7 ?F (37.1 ?C)  ?SpO2: 97% 97%  ? ?No intake or output data in the 24 hours ending 07/26/21 1253 ?There were no vitals filed for this visit. ?Weight change:  ?There is no height or weight on file to calculate BMI.  ? ?Physical Exam: ?General exam: Pleasant, middle-aged Spanish female.  Not in distress at rest. ?Skin: No rashes, lesions or ulcers. ?HEENT: Atraumatic, normocephalic, no obvious bleeding ?Lungs: Clear to auscultation bilaterally ?CVS: Regular rate and rhythm, no murmur ?GI/Abd soft, nontender, nondistended, bowel sound present ?CNS: Alert, awake, oriented x3 ?Psychiatry: Mood appropriate ?Extremities: No pedal edema, no calf tenderness ? ?Data Review: I have personally reviewed the laboratory data and studies available. ? ?F/u labs ordered ?Unresulted Labs (From admission, onward)  ? ?  Start     Ordered  ? 07/25/21 7096  Basic metabolic panel  Daily,   R     ? 07/24/21 1731  ? 07/25/21 0500  CBC  Daily,   R     ? 07/24/21 1731  ? ?  ?  ? ?  ? ? ?Signed, ?Terrilee Croak, MD ?[facility] ?07/26/2021 ? ? ? ? ? ? ? ? ? ? ?  ?

## 2021-07-26 NOTE — Plan of Care (Signed)

## 2021-07-27 DIAGNOSIS — S22080A Wedge compression fracture of T11-T12 vertebra, initial encounter for closed fracture: Secondary | ICD-10-CM | POA: Diagnosis not present

## 2021-07-27 LAB — BASIC METABOLIC PANEL
Anion gap: 14 (ref 5–15)
BUN: 32 mg/dL — ABNORMAL HIGH (ref 8–23)
CO2: 19 mmol/L — ABNORMAL LOW (ref 22–32)
Calcium: 9.3 mg/dL (ref 8.9–10.3)
Chloride: 101 mmol/L (ref 98–111)
Creatinine, Ser: 0.82 mg/dL (ref 0.44–1.00)
GFR, Estimated: >60 mL/min (ref >60)
Glucose, Bld: 132 mg/dL — ABNORMAL HIGH (ref 70–99)
Potassium: 3.6 mmol/L (ref 3.5–5.1)
Sodium: 134 mmol/L — ABNORMAL LOW (ref 135–145)

## 2021-07-27 LAB — CBC
HCT: 40.8 % (ref 36.0–46.0)
Hemoglobin: 14.2 g/dL (ref 12.0–15.0)
MCH: 29.9 pg (ref 26.0–34.0)
MCHC: 34.8 g/dL (ref 30.0–36.0)
MCV: 85.9 fL (ref 80.0–100.0)
Platelets: 322 10*3/uL (ref 150–400)
RBC: 4.75 MIL/uL (ref 3.87–5.11)
RDW: 12.6 % (ref 11.5–15.5)
WBC: 4.8 10*3/uL (ref 4.0–10.5)
nRBC: 0 % (ref 0.0–0.2)

## 2021-07-27 MED ORDER — SODIUM CHLORIDE 0.9 % IV SOLN
INTRAVENOUS | Status: DC
Start: 2021-07-27 — End: 2021-07-29

## 2021-07-27 NOTE — Plan of Care (Signed)
?  Problem: Clinical Measurements: ?Goal: Will remain free from infection ?Outcome: Progressing ?  ?Problem: Nutrition: ?Goal: Adequate nutrition will be maintained ?Outcome: Progressing ?  ?Problem: Coping: ?Goal: Level of anxiety will decrease ?Outcome: Progressing ?  ?Problem: Elimination: ?Goal: Will not experience complications related to urinary retention ?Outcome: Progressing ?  ?Problem: Skin Integrity: ?Goal: Risk for impaired skin integrity will decrease ?Outcome: Progressing ?  ?

## 2021-07-27 NOTE — Progress Notes (Signed)
?PROGRESS NOTE ? ?[place]  ?DOB: 1960-12-25  ?PCP: [name], MD ?WRU:[id number]  ?DOA: 07/24/2021 ? LOS: 3 days  ?Hospital Day: 4 ? ?Brief narrative: ?Denise Walls is a 61 y.o. female with PMH significant for non-Hodgkin's lymphoma, Crohn's disease, prediabetes, HTN, HLD, arthritis, anxiety, insomnia, migraine, uterine fibroid. 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 Since then she is in remission.   ?She was in her usual state of health until December 2022 when she started having lower back pain.  It was initially suspected to be due to osteoarthritis however her symptoms progressed.  For the last 2 weeks, patient was not able to walk and had numbness in bilateral lower extremity. ?She underwent an MRI at [facility] which resulted to show pathological fracture in T12.  PCP directed her to the ED at [facility].  ED physician at [facility] discussed the case with patient's oncologist Dr. Irene Limbo as well as neurosurgeon Dr. Reatha Armour.  Neurosurgeon recommended to transfer the patient to [facility]. ? ?Admitted to hospitalist service ?Neurosurgery and oncology consultation appreciated. ? ?Subjective: ?Patient was seen and examined this morning. ?Pain controlled. ?Lying down in bed.  Not in distress.  Waiting for insurance authorization for T12 biopsy and kyphoplasty.  IR, neurosurgery and oncology following.  ? ?Principal Problem: ?  T12 compression fracture ([place]) ?Active Problems: ?  Hx of lymphoma ?  ?Assessment and Plan: ?Intractable back pain ?T2 compression fracture ?History of large cell non-Hodgkin's lymphoma ?-Suspicious for pathologic fracture.  Unclear at this time if the fracture is pathological related to any recurrence of lymphoma.   ?-3/9 CT neck, chest abdomen pelvis did not show any large lymph nodes.  It confirmed the T12 compression fracture but could not exclude lymphomatous disease. ?-Neurosurgery, oncology  and IR following. ?-Pending insurance authorization for T12 biopsy and kyphoplasty by IR. ?-Continue pain management. ?-Continue PT eval ?  ?Essential hypertension ?-Currently BP controlled on amlodipine 2.5 mg daily ?-Hydralazine as needed ?  ?History of Crohn's disease ?-Continue mesalamine 1.2 g daily as before. ?Follows up with [name] with [facility]. Upcoming GI appointment on 3/17. ? ?Depression ?-Continue Effexor as before. ? ?Stercoral colitis ?-CT abdomen from 3/9 showed large rectal stool ball with rectal wall thickening empirically standing.  Probably due to constipation related to impaired mobility. ?-Continue scheduled Senokot, as needed laxatives.  Encouraged to use as needed laxatives. ?   ?Goals of care ?  Code Status: Full Code  ? ? ?Mobility: Encourage ambulation.  Needs PT eval after biopsy ? ?Nutritional status:  ?Body mass index is 28.59 kg/m?.  ?  ?  ? ? ? ? ?Diet:  ?Diet Order   ? ?       ?  Diet Heart Room service appropriate? Yes; Fluid consistency: Thin  Diet effective now       ?  ? ?  ?  ? ?  ? ? ?DVT prophylaxis:  ?enoxaparin (LOVENOX) injection 40 mg Start: 07/24/21 2200 ?  ?Antimicrobials: None ?Fluid: None ?Consultants: Neurosurgery, oncology ?Family Communication: Daughter at bedside today and helped with interpretation. ? ?Status is: Inpatient ? ?Continue in-hospital care because: Pending insurance authorization for T12 biopsy and kyphoplasty ?Level of care: Med-Surg  ? ?Dispo: The patient is from: Home ?             Anticipated d/c is to: Pending clinical course ?  Patient currently is not medically stable to d/c. ?  Difficult to place patient No ? ? ? ? ?Infusions:  ? sodium chloride    ? ? ?Scheduled Meds: ? amLODipine  2.5 mg Oral Daily  ? dexamethasone (DECADRON) injection  4 mg Intravenous Q6H  ? enoxaparin (LOVENOX) injection  40 mg Subcutaneous Q24H  ? melatonin  3 mg Oral QHS  ? mesalamine  2.4 g Oral Daily  ? senna  1 tablet Oral BID  ? venlafaxine XR  37.5  mg Oral Q breakfast  ? ? ?PRN meds: ?acetaminophen **OR** acetaminophen, albuterol, bisacodyl, hydrALAZINE, morphine injection, ondansetron **OR** ondansetron (ZOFRAN) IV, oxyCODONE, polyethylene glycol, traMADol  ? ?Antimicrobials: ?Anti-infectives (From admission, onward)  ? ? None  ? ?  ? ? ?Objective: ?Vitals:  ? 07/27/21 0508 07/27/21 0825  ?BP: 95/75 125/80  ?Pulse: 80 74  ?Resp: 14 19  ?Temp: 97.9 ?F (36.6 ?C) (!) 97.5 ?F (36.4 ?C)  ?SpO2: 96% 96%  ? ?No intake or output data in the 24 hours ending 07/27/21 1117 ?Filed Weights  ? 07/27/21 5035  ?Weight: 66.4 kg  ? ?Weight change:  ?Body mass index is 28.59 kg/m?.  ? ?Physical Exam: ?General exam: Pleasant, middle-aged Spanish female.  Not in distress at rest. ?Skin: No rashes, lesions or ulcers. ?HEENT: Atraumatic, normocephalic, no obvious bleeding ?Lungs: Clear to auscultation bilaterally ?CVS: Regular rate and rhythm, no murmur ?GI/Abd soft, nontender, nondistended, bowel sound present ?CNS: Alert, awake, oriented x3 ?Psychiatry: Mood appropriate ?Extremities: No pedal edema, no calf tenderness ? ?Data Review: I have personally reviewed the laboratory data and studies available. ? ?F/u labs ordered ?Unresulted Labs (From admission, onward)  ? ? None  ? ?  ? ? ?Signed, ?Terrilee Croak, MD ?[facility] ?07/27/2021 ? ? ? ? ? ? ? ? ? ? ?  ?

## 2021-07-28 DIAGNOSIS — S22080A Wedge compression fracture of T11-T12 vertebra, initial encounter for closed fracture: Secondary | ICD-10-CM | POA: Diagnosis not present

## 2021-07-28 NOTE — Progress Notes (Signed)
?PROGRESS NOTE ? ?[place]  ?DOB: 1960-11-14  ?PCP: [name], MD ?MLY:[id number]  ?DOA: 07/24/2021 ? LOS: 4 days  ?Hospital Day: 5 ? ?Brief narrative: ?Denise Walls is a 61 y.o. female with PMH significant for non-Hodgkin's lymphoma, Crohn's disease, prediabetes, HTN, HLD, arthritis, anxiety, insomnia, migraine, uterine fibroid. 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 Since then she is in remission.   ?She was in her usual state of health until December 2022 when she started having lower back pain.  It was initially suspected to be due to osteoarthritis however her symptoms progressed.  For the last 2 weeks, patient was not able to walk and had numbness in bilateral lower extremity. ?She underwent an MRI at [facility] which resulted to show pathological fracture in T12.  PCP directed her to the ED at [facility].  ED physician at [facility] discussed the case with patient's oncologist Dr. Irene Limbo as well as neurosurgeon Dr. Reatha Armour.  Neurosurgeon recommended to transfer the patient to [facility]. ? ?Admitted to hospitalist service ?Neurosurgery and oncology consultation appreciated. ? ?Subjective: ?Patient was seen and examined this morning. ?Lying on bed.  Not in distress.  Remains on IV fluid.  Waiting for insurance authorization for T12 biopsy and kyphoplasty.  IR, neurosurgery and oncology following.  ? ?Principal Problem: ?  T12 compression fracture ([place]) ?Active Problems: ?  Hx of lymphoma ?  ?Assessment and Plan: ?Intractable back pain ?T2 compression fracture ?History of large cell non-Hodgkin's lymphoma ?-Suspicious for pathologic fracture.  Unclear at this time if the fracture is pathological related to any recurrence of lymphoma.   ?-3/9 CT neck, chest abdomen pelvis did not show any large lymph nodes.  It confirmed the T12 compression fracture but could not exclude lymphomatous disease. ?-Neurosurgery, oncology  and IR following. ?-Pending insurance authorization for T12 biopsy and kyphoplasty by IR. ?-Continue pain management. ?-Continue PT eval ?  ?Essential hypertension ?-Currently BP controlled on amlodipine 2.5 mg daily ?-Hydralazine as needed ?  ?History of Crohn's disease ?-Continue mesalamine 1.2 g daily as before. ?Follows up with [name] with [facility]. Upcoming GI appointment on 3/17. ? ?Depression ?-Continue Effexor as before. ? ?Stercoral colitis ?-CT abdomen from 3/9 showed large rectal stool ball with rectal wall thickening empirically standing.  Probably due to constipation related to impaired mobility. ?-Continue scheduled Senokot, as needed laxatives.  Encouraged to use as needed laxatives. ? ?Dehydration ?-Started on IV fluid yesterday because of elevated BUN.  Repeat labs tomorrow. ?Recent Labs  ?  10/12/20 ?1117 02/05/21 ?0942 06/07/21 ?1155 07/25/21 ?0148 07/26/21 ?8127 07/27/21 ?0309  ?BUN 15 14 14  25* 18 32*  ?CREATININE 0.80 0.81 0.75 0.79 0.90 0.82  ? ? ?   ?Goals of care ?  Code Status: Full Code  ? ? ?Mobility: Encourage ambulation.  Needs PT eval after biopsy ? ?Nutritional status:  ?Body mass index is 28.59 kg/m?.  ?  ?  ? ? ? ? ?Diet:  ?Diet Order   ? ?       ?  Diet Heart Room service appropriate? Yes; Fluid consistency: Thin  Diet effective now       ?  ? ?  ?  ? ?  ? ? ?DVT prophylaxis:  ?enoxaparin (LOVENOX) injection 40 mg Start: 07/24/21 2200 ?  ?Antimicrobials: None ?Fluid: None ?Consultants: Neurosurgery, oncology ?Family Communication: Daughter at bedside today and helped with interpretation. ? ?Status is: Inpatient ? ?Continue in-hospital  care because: Pending insurance authorization for T12 biopsy and kyphoplasty ?Level of care: Med-Surg  ? ?Dispo: The patient is from: Home ?             Anticipated d/c is to: Pending clinical course ?             Patient currently is not medically stable to d/c. ?  Difficult to place patient No ? ? ? ? ?Infusions:  ? sodium chloride 50 mL/hr at  07/28/21 0557  ? ? ?Scheduled Meds: ? amLODipine  2.5 mg Oral Daily  ? dexamethasone (DECADRON) injection  4 mg Intravenous Q6H  ? enoxaparin (LOVENOX) injection  40 mg Subcutaneous Q24H  ? melatonin  3 mg Oral QHS  ? mesalamine  2.4 g Oral Daily  ? senna  1 tablet Oral BID  ? venlafaxine XR  37.5 mg Oral Q breakfast  ? ? ?PRN meds: ?acetaminophen **OR** acetaminophen, albuterol, bisacodyl, hydrALAZINE, morphine injection, ondansetron **OR** ondansetron (ZOFRAN) IV, oxyCODONE, polyethylene glycol, traMADol  ? ?Antimicrobials: ?Anti-infectives (From admission, onward)  ? ? None  ? ?  ? ? ?Objective: ?Vitals:  ? 07/28/21 0530 07/28/21 0700  ?BP: 129/79 110/65  ?Pulse: 61 66  ?Resp: 14 19  ?Temp: 98.4 ?F (36.9 ?C) 98 ?F (36.7 ?C)  ?SpO2: 95%   ? ? ?Intake/Output Summary (Last 24 hours) at 07/28/2021 1425 ?Last data filed at 07/28/2021 3403 ?Gross per 24 hour  ?Intake 1117.98 ml  ?Output --  ?Net 1117.98 ml  ? ?Filed Weights  ? 07/27/21 5248  ?Weight: 66.4 kg  ? ?Weight change:  ?Body mass index is 28.59 kg/m?.  ? ?Physical Exam: ?General exam: Pleasant, middle-aged Spanish female.  Not in distress at rest. ?Skin: No rashes, lesions or ulcers. ?HEENT: Atraumatic, normocephalic, no obvious bleeding ?Lungs: Clear to auscultation bilaterally ?CVS: Regular rate and rhythm, no murmur ?GI/Abd soft, nontender, nondistended, bowel sound present ?CNS: Alert, awake, oriented x3 ?Psychiatry: Mood appropriate ?Extremities: No pedal edema, no calf tenderness ? ?Data Review: I have personally reviewed the laboratory data and studies available. ? ?F/u labs ordered ?Unresulted Labs (From admission, onward)  ? ?  Start     Ordered  ? 07/29/21 0500  CBC with Differential/Platelet  Tomorrow morning,   R       ?Question:  Specimen collection method  Answer:  Lab=Lab collect  ? 07/28/21 1425  ? 07/29/21 1859  Basic metabolic panel  Tomorrow morning,   R       ?Question:  Specimen collection method  Answer:  Lab=Lab collect  ? 07/28/21 1425   ? ?  ?  ? ?  ? ? ?Signed, ?Terrilee Croak, MD ?[facility] ?07/28/2021 ? ? ? ? ? ? ? ? ? ? ?  ?

## 2021-07-28 NOTE — Progress Notes (Signed)
Mobility Specialist: Progress Note ? ? 07/28/21 1220  ?Mobility  ?Activity Ambulated with assistance in hallway  ?Level of Assistance Standby assist, set-up cues, supervision of patient - no hands on  ?Assistive Device Front wheel walker  ?Distance Ambulated (ft) 180 ft  ?Activity Response Tolerated well  ?$Mobility charge 1 Mobility  ? ?Received pt in bed having no complaints and agreeable to mobility. Asymptomatic throughout ambulation, returned back to bed w/ call bell in reach and all needs met. ? ?Denise Walls ?Mobility Specialist ?Mobility Specialist [place]: [phone] ?Mobility Specialist [place]: [phone] ? ?

## 2021-07-29 DIAGNOSIS — S22080A Wedge compression fracture of T11-T12 vertebra, initial encounter for closed fracture: Secondary | ICD-10-CM | POA: Diagnosis not present

## 2021-07-29 LAB — BASIC METABOLIC PANEL
Anion gap: 7 (ref 5–15)
BUN: 24 mg/dL — ABNORMAL HIGH (ref 8–23)
CO2: 23 mmol/L (ref 22–32)
Calcium: 8.6 mg/dL — ABNORMAL LOW (ref 8.9–10.3)
Chloride: 108 mmol/L (ref 98–111)
Creatinine, Ser: 0.81 mg/dL (ref 0.44–1.00)
GFR, Estimated: >60 mL/min (ref >60)
Glucose, Bld: 143 mg/dL — ABNORMAL HIGH (ref 70–99)
Potassium: 4.3 mmol/L (ref 3.5–5.1)
Sodium: 138 mmol/L (ref 135–145)

## 2021-07-29 LAB — CBC WITH DIFFERENTIAL/PLATELET
Abs Immature Granulocytes: 0.15 10*3/uL — ABNORMAL HIGH (ref 0.00–0.07)
Basophils Absolute: 0 10*3/uL (ref 0.0–0.1)
Basophils Relative: 0 %
Eosinophils Absolute: 0 10*3/uL (ref 0.0–0.5)
Eosinophils Relative: 0 %
HCT: 37.7 % (ref 36.0–46.0)
Hemoglobin: 12.9 g/dL (ref 12.0–15.0)
Immature Granulocytes: 2 %
Lymphocytes Relative: 11 %
Lymphs Abs: 1 10*3/uL (ref 0.7–4.0)
MCH: 30.1 pg (ref 26.0–34.0)
MCHC: 34.2 g/dL (ref 30.0–36.0)
MCV: 87.9 fL (ref 80.0–100.0)
Monocytes Absolute: 0.1 10*3/uL (ref 0.1–1.0)
Monocytes Relative: 1 %
Neutro Abs: 7.8 10*3/uL — ABNORMAL HIGH (ref 1.7–7.7)
Neutrophils Relative %: 86 %
Platelets: 282 10*3/uL (ref 150–400)
RBC: 4.29 MIL/uL (ref 3.87–5.11)
RDW: 13.1 % (ref 11.5–15.5)
WBC: 9 10*3/uL (ref 4.0–10.5)
nRBC: 0 % (ref 0.0–0.2)

## 2021-07-29 NOTE — Progress Notes (Signed)
Mobility Specialist: Progress Note ? ? 07/29/21 1047  ?Mobility  ?Activity Ambulated with assistance in hallway  ?Level of Assistance Standby assist, set-up cues, supervision of patient - no hands on  ?Assistive Device Front wheel walker  ?Distance Ambulated (ft) 300 ft  ?Activity Response Tolerated well  ?$Mobility charge 1 Mobility  ? ?Received pt in bed having no complaints and agreeable to mobility. Asymptomatic throughout ambulation, returned back to bed w/ call bell in reach and all needs met. ? ?Denise Walls ?Mobility Specialist ?Mobility Specialist [place]: [phone] ?Mobility Specialist [place]: [phone] ? ?

## 2021-07-29 NOTE — Plan of Care (Signed)

## 2021-07-29 NOTE — Progress Notes (Signed)
?PROGRESS NOTE ? ?[place]  ?DOB: 1960-11-29  ?PCP: [name], MD ?JSH:[id number]  ?DOA: 07/24/2021 ? LOS: 5 days  ?Hospital Day: 6 ? ?Brief narrative: ?Denise Walls is a 61 y.o. female with PMH significant for non-Hodgkin's lymphoma, Crohn's disease, prediabetes, HTN, HLD, arthritis, anxiety, insomnia, migraine, uterine fibroid. 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 Since then she is in remission.   ?She was in her usual state of health until December 2022 when she started having lower back pain.  It was initially suspected to be due to osteoarthritis however her symptoms progressed.  For the last 2 weeks, patient was not able to walk and had numbness in bilateral lower extremity. ?She underwent an MRI at [facility] which resulted to show pathological fracture in T12.  PCP directed her to the ED at [facility].  ED physician at [facility] discussed the case with patient's oncologist Dr. Irene Limbo as well as neurosurgeon Dr. Reatha Armour.  Neurosurgeon recommended to transfer the patient to [facility]. ? ?Admitted to hospitalist service ?Neurosurgery and oncology consultation appreciated. ? ?Subjective: ?Patient was seen and examined this morning. ?Lying on bed.  Not in distress.  Remains on IV fluid.  Waiting for insurance authorization for T12 biopsy and kyphoplasty.  IR, neurosurgery and oncology following.  ? ?Principal Problem: ?  T12 compression fracture ([place]) ?Active Problems: ?  Hx of lymphoma ?  ?Assessment and Plan: ?Intractable back pain ?T2 compression fracture ?History of large cell non-Hodgkin's lymphoma ?-Suspicious for pathologic fracture.  Unclear at this time if the fracture is pathological related to any recurrence of lymphoma.   ?-3/9 CT neck, chest abdomen pelvis did not show any large lymph nodes.  It confirmed the T12 compression fracture but could not exclude lymphomatous disease. ?-Neurosurgery, oncology  and IR following. ?-Pending insurance authorization for T12 biopsy and kyphoplasty by IR. ?-Continue pain management. ?-Continue PT eval ?  ?Essential hypertension ?-Currently BP controlled on amlodipine 2.5 mg daily ?-Hydralazine as needed ?  ?History of Crohn's disease ?-Continue mesalamine 1.2 g daily as before. ?Follows up with [name] with [facility]. Upcoming GI appointment on 3/17. ? ?Depression ?-Continue Effexor as before. ? ?Stercoral colitis ?-CT abdomen from 3/9 showed large rectal stool ball with rectal wall thickening empirically standing.  Probably due to constipation related to impaired mobility. ?-Continue scheduled Senokot, as needed laxatives.  Encouraged to use as needed laxatives. ?-Last bowel movement yesterday. ? ?Dehydration ?-Given IV fluids for last 3 days.  Seems adequately hydrated.  We will stop IV fluid today. ?   ?Goals of care ?  Code Status: Full Code  ? ? ?Mobility: Encourage ambulation.  Needs PT eval after biopsy ? ?Nutritional status:  ?Body mass index is 28.59 kg/m?.  ?  ?  ? ? ? ? ?Diet:  ?Diet Order   ? ?       ?  Diet NPO time specified  Diet effective midnight       ?  ?  Diet regular Room service appropriate? Yes; Fluid consistency: Thin  Diet effective now       ?  ? ?  ?  ? ?  ? ? ?DVT prophylaxis:  ?enoxaparin (LOVENOX) injection 40 mg Start: 07/24/21 2200 ?  ?Antimicrobials: None ?Fluid: None ?Consultants: Neurosurgery, oncology ?Family Communication: Daughter at bedside today and helped with interpretation. ? ?Status is: Inpatient ? ?Continue in-hospital care because: Pending insurance authorization for T12 biopsy and  kyphoplasty ?Level of care: Med-Surg  ? ?Dispo: The patient is from: Home ?             Anticipated d/c is to: Pending clinical course ?             Patient currently is not medically stable to d/c. ?  Difficult to place patient No ? ? ? ? ?Infusions:  ? ? ? ?Scheduled Meds: ? amLODipine  2.5 mg Oral Daily  ? dexamethasone (DECADRON) injection  4 mg  Intravenous Q6H  ? enoxaparin (LOVENOX) injection  40 mg Subcutaneous Q24H  ? melatonin  3 mg Oral QHS  ? mesalamine  2.4 g Oral Daily  ? senna  1 tablet Oral BID  ? venlafaxine XR  37.5 mg Oral Q breakfast  ? ? ?PRN meds: ?acetaminophen **OR** acetaminophen, albuterol, bisacodyl, hydrALAZINE, morphine injection, ondansetron **OR** ondansetron (ZOFRAN) IV, oxyCODONE, polyethylene glycol, traMADol  ? ?Antimicrobials: ?Anti-infectives (From admission, onward)  ? ? None  ? ?  ? ? ?Objective: ?Vitals:  ? 07/29/21 1023 07/29/21 1140  ?BP:  (!) 141/81  ?Pulse: 61   ?Resp:    ?Temp:    ?SpO2:    ? ?No intake or output data in the 24 hours ending 07/29/21 1351 ? ?Filed Weights  ? 07/27/21 1601  ?Weight: 66.4 kg  ? ?Weight change:  ?Body mass index is 28.59 kg/m?.  ? ?Physical Exam: ?General exam: Pleasant, middle-aged Spanish female.  Not in distress at rest. ?Skin: No rashes, lesions or ulcers. ?HEENT: Atraumatic, normocephalic, no obvious bleeding ?Lungs: Clear to auscultation bilaterally ?CVS: Regular rate and rhythm, no murmur ?GI/Abd soft, nontender, nondistended, bowel sound present ?CNS: Alert, awake, oriented x3 ?Psychiatry: Mood appropriate ?Extremities: No pedal edema, no calf tenderness ? ?Data Review: I have personally reviewed the laboratory data and studies available. ? ?F/u labs ordered ?Unresulted Labs (From admission, onward)  ? ? None  ? ?  ? ? ?Signed, ?Terrilee Croak, MD ?[facility] ?07/29/2021 ? ? ? ? ? ? ? ? ? ? ?  ?

## 2021-07-29 NOTE — Progress Notes (Signed)
Brief Update: ? ?Denise Walls is on our radar for VP/KP.  Insurance approval has been sought.  It is still pending review and approval.  Will continue checking in with insurance until decision is made. ? ? ?Electronically Signed: ?Pasty Spillers, PA ?07/29/2021, 11:12 AM ? ? ?

## 2021-07-30 DIAGNOSIS — S22080A Wedge compression fracture of T11-T12 vertebra, initial encounter for closed fracture: Secondary | ICD-10-CM | POA: Diagnosis not present

## 2021-07-30 LAB — SURGICAL PCR SCREEN
MRSA, PCR: NEGATIVE
Staphylococcus aureus: NEGATIVE

## 2021-07-30 MED ORDER — MUPIROCIN 2 % EX OINT
1.0000 "application " | TOPICAL_OINTMENT | Freq: Two times a day (BID) | CUTANEOUS | Status: DC
  Filled 2021-07-30 (×2): qty 22

## 2021-07-30 NOTE — Plan of Care (Signed)

## 2021-07-30 NOTE — Progress Notes (Signed)
?PROGRESS NOTE ? ?[place]  ?DOB: 05/09/1961  ?PCP: [name], MD ?AUQ:[id number]  ?DOA: 07/24/2021 ? LOS: 6 days  ?Hospital Day: 7 ? ?Brief narrative: ?Denise Walls is a 61 y.o. female with PMH significant for non-Hodgkin's lymphoma, Crohn's disease, prediabetes, HTN, HLD, arthritis, anxiety, insomnia, migraine, uterine fibroid. 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 Since then she is in remission.   ?She was in her usual state of health until December 2022 when she started having lower back pain.  It was initially suspected to be due to osteoarthritis however her symptoms progressed.  For the last 2 weeks, patient was not able to walk and had numbness in bilateral lower extremity. ?She underwent an MRI at [facility] which resulted to show pathological fracture in T12.  PCP directed her to the ED at [facility].  ED physician at [facility] discussed the case with patient's oncologist Dr. Irene Limbo as well as neurosurgeon Dr. Reatha Armour.  Neurosurgeon recommended to transfer the patient to [facility]. ? ?Admitted to hospitalist service ?Neurosurgery and oncology consultation appreciated. ? ?Subjective: ?Patient was seen and examined this morning. ?Son at bedside. ?Patient is on dexamethasone 6 mg every 6 hours. ?She states she has been able to get up and walk out of the hallway without pain.  She is not requiring any pain medicines. ? ?Principal Problem: ?  T12 compression fracture ([place]) ?Active Problems: ?  Hx of lymphoma ?  ?Assessment and Plan: ?Intractable back pain ?T2 compression fracture ?History of large cell non-Hodgkin's lymphoma ?-Suspicious for pathologic fracture.  Unclear at this time if the fracture is pathological related to any recurrence of lymphoma.   ?-3/9 CT neck, chest abdomen pelvis did not show any large lymph nodes.  It confirmed the T12 compression fracture but could not exclude lymphomatous  disease. ?-Neurosurgery, oncology and IR following. ?-Pending insurance authorization for T12 biopsy and kyphoplasty by IR. ?-Continue dexamethasone as recommended by neurosurgery. ?-Since she is just waiting for insurance authorization for kyphoplasty, I asked oncology and IR if she can be discharged and this procedure can be done as an outpatient.  Pending response. ?  ?Essential hypertension ?-Currently BP controlled on amlodipine 2.5 mg daily ?-Hydralazine as needed ? ?Hyponatremia ?-Improved with IV fluid. ?Recent Labs  ?Lab 07/25/21 ?0148 07/26/21 ?6389 07/27/21 ?0309 07/29/21 ?3734  ?NA 138 137 134* 138  ? ?History of Crohn's disease ?-Continue mesalamine 1.2 g daily as before. ?Follows up with [name] with [facility]. Upcoming GI appointment on 3/17. ? ?Depression ?-Continue Effexor as before. ? ?Stercoral colitis ?-CT abdomen from 3/9 showed large rectal stool ball with rectal wall thickening empirically standing.  Probably due to constipation related to impaired mobility. ?-Continue scheduled Senokot, as needed laxatives.  Encouraged to use as needed laxatives. ?-Having bowel movement every day. ?   ?Goals of care ?  Code Status: Full Code  ? ? ?Mobility: Encourage ambulation.  Needs PT eval after biopsy ? ?Nutritional status:  ?Body mass index is 28.59 kg/m?.  ?  ?  ? ? ? ? ?Diet:  ?Diet Order   ? ?       ?  Diet NPO time specified  Diet effective midnight       ?  ? ?  ?  ? ?  ? ? ?DVT prophylaxis:  ?enoxaparin (LOVENOX) injection 40 mg Start: 07/24/21 2200 ?  ?Antimicrobials: None ?Fluid: None ?Consultants: Neurosurgery, oncology ?Family Communication: Son at  bedside today and helped with interpretation. ? ?Status is: Inpatient ? ?Continue in-hospital care because: Pending insurance authorization for T12 biopsy and kyphoplasty ?Level of care: Med-Surg  ? ?Dispo: The patient is from: Home ?             Anticipated d/c is to: Pending clinical course ?             Patient currently is not medically  stable to d/c. ?  Difficult to place patient No ? ? ? ? ?Infusions:  ? ? ? ?Scheduled Meds: ? amLODipine  2.5 mg Oral Daily  ? dexamethasone (DECADRON) injection  4 mg Intravenous Q6H  ? enoxaparin (LOVENOX) injection  40 mg Subcutaneous Q24H  ? melatonin  3 mg Oral QHS  ? mesalamine  2.4 g Oral Daily  ? mupirocin ointment  1 application. Nasal BID  ? senna  1 tablet Oral BID  ? venlafaxine XR  37.5 mg Oral Q breakfast  ? ? ?PRN meds: ?acetaminophen **OR** acetaminophen, albuterol, bisacodyl, hydrALAZINE, morphine injection, ondansetron **OR** ondansetron (ZOFRAN) IV, oxyCODONE, polyethylene glycol, traMADol  ? ?Antimicrobials: ?Anti-infectives (From admission, onward)  ? ? None  ? ?  ? ? ?Objective: ?Vitals:  ? 07/29/21 2050 07/30/21 0750  ?BP: (!) 103/56 128/75  ?Pulse: 65 (!) 56  ?Resp: 17 18  ?Temp: 98.4 ?F (36.9 ?C) 98.2 ?F (36.8 ?C)  ?SpO2: 97% 99%  ? ? ?Intake/Output Summary (Last 24 hours) at 07/30/2021 0803 ?Last data filed at 07/29/2021 2215 ?Gross per 24 hour  ?Intake 1877.51 ml  ?Output --  ?Net 1877.51 ml  ? ? ?Filed Weights  ? 07/27/21 9702  ?Weight: 66.4 kg  ? ?Weight change:  ?Body mass index is 28.59 kg/m?.  ? ?Physical Exam: ?General exam: Pleasant, middle-aged Spanish female.  Not in distress at rest. ?Skin: No rashes, lesions or ulcers. ?HEENT: Atraumatic, normocephalic, no obvious bleeding ?Lungs: Clear to auscultation bilaterally ?CVS: Regular rate and rhythm, no murmur ?GI/Abd soft, nontender, nondistended, bowel sound present ?CNS: Alert, awake, oriented x3 ?Psychiatry: Mood appropriate ?Extremities: No pedal edema, no calf tenderness ? ?Data Review: I have personally reviewed the laboratory data and studies available. ? ?F/u labs ordered ?Unresulted Labs (From admission, onward)  ? ?  Start     Ordered  ? 07/30/21 0737  Surgical PCR screen  Once,   R       ? 07/30/21 0736  ? ?  ?  ? ?  ? ? ?Signed, ?Terrilee Croak, MD ?[facility] ?07/30/2021 ? ? ? ? ? ? ? ? ? ? ?  ?

## 2021-07-30 NOTE — Progress Notes (Signed)
Mobility Specialist Progress Note  ? ? 07/30/21 1039  ?Mobility  ?Activity Ambulated independently in hallway  ?Level of Assistance Standby assist, set-up cues, supervision of patient - no hands on  ?Assistive Device None  ?Distance Ambulated (ft) 400 ft  ?Activity Response Tolerated well  ?$Mobility charge 1 Mobility  ? ?Pt received in bed and agreeable. No complaints. Noticed pt was holding her back and stretching. Returned to sitting EOB with RN and family present.  ? ?Denise Walls ?Mobility Specialist  ?M.S. 5N: [phone]  ?

## 2021-07-31 ENCOUNTER — Encounter (HOSPITAL_COMMUNITY): Payer: Self-pay | Admitting: Internal Medicine

## 2021-07-31 ENCOUNTER — Inpatient Hospital Stay (HOSPITAL_COMMUNITY): Payer: BC Managed Care – PPO

## 2021-07-31 DIAGNOSIS — S22080A Wedge compression fracture of T11-T12 vertebra, initial encounter for closed fracture: Secondary | ICD-10-CM | POA: Diagnosis not present

## 2021-07-31 LAB — BASIC METABOLIC PANEL
Anion gap: 10 (ref 5–15)
BUN: 26 mg/dL — ABNORMAL HIGH (ref 8–23)
CO2: 22 mmol/L (ref 22–32)
Calcium: 8.4 mg/dL — ABNORMAL LOW (ref 8.9–10.3)
Chloride: 105 mmol/L (ref 98–111)
Creatinine, Ser: 0.73 mg/dL (ref 0.44–1.00)
GFR, Estimated: >60 mL/min (ref >60)
Glucose, Bld: 132 mg/dL — ABNORMAL HIGH (ref 70–99)
Potassium: 4.2 mmol/L (ref 3.5–5.1)
Sodium: 137 mmol/L (ref 135–145)

## 2021-07-31 LAB — CBC WITH DIFFERENTIAL/PLATELET
Abs Immature Granulocytes: 0.08 10*3/uL — ABNORMAL HIGH (ref 0.00–0.07)
Basophils Absolute: 0 10*3/uL (ref 0.0–0.1)
Basophils Relative: 0 %
Eosinophils Absolute: 0 10*3/uL (ref 0.0–0.5)
Eosinophils Relative: 0 %
HCT: 41 % (ref 36.0–46.0)
Hemoglobin: 14.1 g/dL (ref 12.0–15.0)
Immature Granulocytes: 1 %
Lymphocytes Relative: 14 %
Lymphs Abs: 1.1 10*3/uL (ref 0.7–4.0)
MCH: 29.9 pg (ref 26.0–34.0)
MCHC: 34.4 g/dL (ref 30.0–36.0)
MCV: 86.9 fL (ref 80.0–100.0)
Monocytes Absolute: 0.2 10*3/uL (ref 0.1–1.0)
Monocytes Relative: 3 %
Neutro Abs: 6.5 10*3/uL (ref 1.7–7.7)
Neutrophils Relative %: 82 %
Platelets: 265 10*3/uL (ref 150–400)
RBC: 4.72 MIL/uL (ref 3.87–5.11)
RDW: 12.9 % (ref 11.5–15.5)
WBC: 7.9 10*3/uL (ref 4.0–10.5)
nRBC: 0 % (ref 0.0–0.2)

## 2021-07-31 LAB — PROTIME-INR
INR: 1.1 (ref 0.8–1.2)
Prothrombin Time: 14.6 seconds (ref 11.4–15.2)

## 2021-07-31 MED ORDER — MIDAZOLAM HCL 2 MG/2ML IJ SOLN
INTRAMUSCULAR | Status: AC
  Filled 2021-07-31: qty 2

## 2021-07-31 MED ORDER — CEFAZOLIN SODIUM-DEXTROSE 2-4 GM/100ML-% IV SOLN: 2.0000 g | Freq: Once | INTRAVENOUS | Status: AC

## 2021-07-31 MED ORDER — FENTANYL CITRATE (PF) 100 MCG/2ML IJ SOLN
INTRAMUSCULAR | Status: AC
  Filled 2021-07-31: qty 2

## 2021-07-31 MED ORDER — BUPIVACAINE HCL (PF) 0.5 % IJ SOLN
INTRAMUSCULAR | Status: AC
  Filled 2021-07-31: qty 30

## 2021-07-31 MED ORDER — BUPIVACAINE HCL (PF) 0.5 % IJ SOLN
INTRAMUSCULAR | Status: DC | PRN
  Administered 2021-07-31: 20 mL
  Administered 2021-07-31: 30 mL

## 2021-07-31 MED ORDER — FENTANYL CITRATE (PF) 100 MCG/2ML IJ SOLN
INTRAMUSCULAR | Status: DC | PRN
  Administered 2021-07-31: 25 ug via INTRAVENOUS
  Administered 2021-07-31 (×2): 50 ug via INTRAVENOUS
  Administered 2021-07-31: 25 ug via INTRAVENOUS

## 2021-07-31 MED ORDER — LIDOCAINE HCL (PF) 1 % IJ SOLN
INTRAMUSCULAR | Status: DC | PRN
  Administered 2021-07-31: 20 mL
  Administered 2021-07-31: 30 mL

## 2021-07-31 MED ORDER — MIDAZOLAM HCL 2 MG/2ML IJ SOLN
INTRAMUSCULAR | Status: DC | PRN
  Administered 2021-07-31 (×3): 1 mg via INTRAVENOUS

## 2021-07-31 MED ORDER — ENOXAPARIN SODIUM 40 MG/0.4ML IJ SOSY
40.0000 mg | PREFILLED_SYRINGE | INTRAMUSCULAR | Status: DC
  Administered 2021-08-01: 40 mg via SUBCUTANEOUS
  Filled 2021-07-31: qty 0.4

## 2021-07-31 MED ORDER — LIDOCAINE HCL 1 % IJ SOLN
INTRAMUSCULAR | Status: AC
  Filled 2021-07-31: qty 20

## 2021-07-31 MED ORDER — CEFAZOLIN SODIUM-DEXTROSE 2-4 GM/100ML-% IV SOLN
INTRAVENOUS | Status: AC
  Administered 2021-07-31: 2 g via INTRAVENOUS
  Filled 2021-07-31: qty 100

## 2021-07-31 NOTE — Plan of Care (Signed)
?  Problem: Education: ?Goal: Knowledge of General Education information will improve ?Description: Including pain rating scale, medication(s)/side effects and non-pharmacologic comfort measures ?Outcome: Progressing ?  ?Problem: Health Behavior/Discharge Planning: ?Goal: Ability to manage health-related needs will improve ?Outcome: Progressing ?  ?Problem: Clinical Measurements: ?Goal: Ability to maintain clinical measurements within normal limits will improve ?Outcome: Progressing ?  ?Problem: Activity: ?Goal: Risk for activity intolerance will decrease ?Outcome: Progressing ?  ?Problem: Pain Managment: ?Goal: General experience of comfort will improve ?Outcome: Progressing ?  ?Problem: Safety: ?Goal: Ability to remain free from injury will improve ?Outcome: Progressing ?  ?Problem: Skin Integrity: ?Goal: Risk for impaired skin integrity will decrease ?Outcome: Progressing ?  ?

## 2021-07-31 NOTE — Progress Notes (Signed)
MEDICATION-RELATED CONSULT NOTE  ? ?IR Procedure Consult - Anticoagulant/Antiplatelet PTA/Inpatient Med List Review by Pharmacist  ? ? ?Procedure: T12, L1 biopsy, vetrebroplasty/kyphoplasty and OsteoCool ablation ?   ?Completed: 10:12am on 07/31/21 ? ?Post-Procedural bleeding risk per IR MD assessment:  standard ? ?Antithrombotic medications on inpatient or PTA profile prior to procedure:   Enoxaparin 40 mg SQ q24h at 10pm - last dose 3/14 pm ?  ?Recommended restart time per IR Post-Procedure Guidelines:   ? Post-procedure day ? ?Other considerations:    ?  No antiplatelet meds. ? ?Plan:     ? No Enoxaparin tonight. ? Resume Enoxaparin 40 mg SQ Q24h on 3/16 am. ? ?Arty Baumgartner, RPh ?07/31/2021 ?12:26 PM ? ? ? ? ? ?

## 2021-07-31 NOTE — Plan of Care (Signed)

## 2021-07-31 NOTE — Procedures (Signed)
INTERVENTIONAL NEURORADIOLOGY BRIEF POSTPROCEDURE NOTE ? ?FLUOROSCOPY GUIDED T12 AND L1 CORE BONE BIOPSY, RADIOFREQUENCY ABLATION AND KYPHOPLASTY  ?SPINAL FLAT PANEL CT  ?  ?Attending: Dr. Pedro Earls ?  ?Assistant: None. ?  ?Diagnosis: Pathologic fractures of T12 and L1 ?  ?Access site: Percutaneous  ?  ?Anesthesia: Moderate sedation  ?  ?Medication used: 3 mg Versed IV; 100 mcg Fentanyl IV; 2 g Ancef IV. ?  ?Complications:  ?none.  ?  ?Estimated blood loss: Negligible  ?  ?Specimen: 2 core bone biopsies of each level (T12 and L1).  ?  ?Findings: Approximately 40% loss of vertebral body height at T12 and approximately 10% loss of vertebral body height at L1.  Bilateral transpedicular approach utilized for T12 and L1 core bone biopsy, radiofrequency ablation and balloon kyphoplasty.  Small amount of cement protruding into the left lateral recess at T12 is contained within the tumor, as seen on flat panel CT and correlation with prior MRI.] ?  ?The patient tolerated the procedure well without incident or complication and is in stable condition. ? ?  ?

## 2021-07-31 NOTE — Plan of Care (Signed)

## 2021-07-31 NOTE — Progress Notes (Signed)
? ? ?Referring Physician(s): ?Dr. Pietro Cassis ? ?Supervising Physician: Pedro Earls ? ?Patient Status:  [facility] - In-pt ? ?Chief Complaint: ?T12 compression fracture ? ?Subjective: ?Patient case now lead by Dr. Karenann Cai. ?She has reviewed imaging and feels that L1 also has disease and warrants intervention.  ?Patient has been approved for T12, L1 osteocool abalation, vertebroplasty/kyphoplasty, and biopsy of lesion due to intractable pain with evidence of pathologic fracture.  ? ?Patient presents to IR this AM for procedure in her usual state of health.  She has been NPO.  ?Last dose of lovenox was yesterday.  ? ?Allergies: ?Nsaids ? ?Medications: ?Prior to Admission medications   ?Medication Sig Start Date End Date Taking? Authorizing Provider  ?acetaminophen (TYLENOL) 325 MG tablet Take 325-650 mg by mouth every 6 (six) hours as needed for mild pain.   Yes [provider]  ?amLODipine (NORVASC) 5 MG tablet TAKE 1 TABLET (5 MG TOTAL) BY MOUTH DAILY. ?Patient taking differently: Take 5 mg by mouth daily. 12/30/19 07/25/21 Yes Katsadouros, Vasilios, MD  ?cholecalciferol (VITAMIN D3) 25 MCG (1000 UT) tablet Take 1 tablet (1,000 Units total) by mouth daily. 02/04/19  Yes [name], MD  ?mesalamine (LIALDA) 1.2 g EC tablet Take 2 tablets (2.4 g total) by mouth daily. 04/17/21  Yes Jackquline Denmark, MD  ?traMADol (ULTRAM) 50 MG tablet Take 50 mg by mouth every 6 (six) hours as needed for pain. 07/15/21  Yes [provider]  ?venlafaxine XR (EFFEXOR-XR) 37.5 MG 24 hr capsule 37.5 mg daily with breakfast. 02/24/21  Yes [provider]  ? ? ? ?Vital Signs: ?BP 134/80 (BP Location: Right Arm)   Pulse (!) 52   Temp 98.1 ?F (36.7 ?C) (Oral)   Resp 16   Ht 5' (1.524 m)   Wt 146 lb 6.2 oz (66.4 kg)   LMP  (LMP Unknown)   SpO2 98%   BMI 28.59 kg/m?  ? ?Physical Exam ?NAD, alert ?Heart: RRR ?Chest: CTABL ?Back: point tenderness in the mid-back.  ? ?Imaging: ?No results  found. ? ?Labs: ? ?CBC: ?Recent Labs  ?  07/26/21 ?0634 07/27/21 ?0309 07/29/21 ?0428 07/31/21 ?0853  ?WBC 5.8 4.8 9.0 7.9  ?HGB 14.3 14.2 12.9 14.1  ?HCT 42.0 40.8 37.7 41.0  ?PLT 309 322 282 265  ? ? ?COAGS: ?Recent Labs  ?  07/31/21 ?0853  ?INR 1.1  ? ? ?BMP: ?Recent Labs  ?  07/26/21 ?0634 07/27/21 ?0309 07/29/21 ?0428 07/31/21 ?0853  ?NA 137 134* 138 137  ?K 3.9 3.6 4.3 4.2  ?CL 103 101 108 105  ?CO2 23 19* 23 22  ?GLUCOSE 113* 132* 143* 132*  ?BUN 18 32* 24* 26*  ?CALCIUM 9.4 9.3 8.6* 8.4*  ?CREATININE 0.90 0.82 0.81 0.73  ?GFRNONAA >60 >60 >60 >60  ? ? ?LIVER FUNCTION TESTS: ?Recent Labs  ?  10/12/20 ?1117 02/05/21 ?0942 06/07/21 ?1155  ?BILITOT 0.4 0.6 0.4  ?AST 26 22 20   ?ALT 24 22 20   ?ALKPHOS 123 103 92  ?PROT 7.0 7.3 7.3  ?ALBUMIN 3.8 4.1 4.2  ? ? ?Assessment and Plan: ?T12, L1 biopsy, vertebroplasty/kyphoplasty, and Osteocool ablation ?Patient with intractable back pain, decreased mobility, and concern for lymphomatous process at T12, L1.  ?She has been approved for VP/KP, with Osteocool ablation. Biopsy the time of procedure.  ?She understands the high risk nature of her procedure given the disease involvement near her spinal cord.  ?Neurosurgery and Oncology both following patient and agree with plan to  proceed.  ? ?Risks and benefits of kyphoplasty with ablation were discussed with the patient including, but not limited to education regarding the natural healing process of compression fractures without intervention, bleeding, infection, cement migration which may cause spinal cord damage, paralysis, pulmonary embolism or even death. ? ?This interventional procedure involves the use of X-rays and because of the nature of the planned procedure, it is possible that we will have prolonged use of X-ray fluoroscopy. ? ?Potential radiation risks to you include (but are not limited to) the following: ?- A slightly elevated risk for cancer  several years later in life. This risk is typically less ?than 0.5%  percent. This risk is low in comparison to the normal incidence of human ?cancer, which is 33% for women and 50% for men according to the American Cancer ?Society. ?- Radiation induced injury can include skin redness, resembling a rash, tissue breakdown / ulcers and hair loss (which can be temporary or permanent).  ? ?The likelihood of either of these occurring depends on the difficulty of the procedure and whether you are sensitive to radiation due to previous procedures, disease, or genetic conditions.  ? ?IF your procedure requires a prolonged use of radiation, you will be notified and given written instructions for further action.  It is your responsibility to monitor the irradiated area for the 2 weeks following the procedure and to notify your physician if you are concerned that you have suffered a radiation induced injury.   ? ?All of the patient's questions were answered, patient is agreeable to proceed. ? ?Consent signed and in chart. ? ?Electronically Signed: ?Docia Barrier, PA ?07/31/2021, 10:02 AM ? ? ?I spent a total of 15 Minutes at the the patient's bedside AND on the patient's hospital floor or unit, greater than 50% of which was counseling/coordinating care for pathologic T12 compression fracture, abnormal imaging enhancement at L1.  ? ? ? ? ? ?

## 2021-07-31 NOTE — Progress Notes (Signed)
?PROGRESS NOTE ? ?[place]  ?DOB: [birth year]  ?PCP: [name], MD ?TMH:[id number]  ?DOA: 07/24/2021 ? LOS: 7 days  ?Hospital Day: 8 ? ?Brief narrative: ?Denise Walls is a 61 y.o. female with PMH significant for non-Hodgkin's lymphoma, Crohn's disease, prediabetes, HTN, HLD, arthritis, anxiety, insomnia, migraine, uterine fibroid. 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 Since then she is in remission.   ?She was in her usual state of health until December 2022 when she started having lower back pain.  It was initially suspected to be due to osteoarthritis however her symptoms progressed.  For the last 2 weeks, patient was not able to walk and had numbness in bilateral lower extremity. ?She underwent an MRI at [facility] which resulted to show pathological fracture in T12.  PCP directed her to the ED at [facility].  ED physician at [facility] discussed the case with patient's oncologist Dr. Irene Limbo as well as neurosurgeon Dr. Reatha Armour.  Neurosurgeon recommended to transfer the patient to [facility]. ? ?Admitted to hospitalist service ?Neurosurgery and oncology consultation appreciated. ? ?Subjective: ?Patient was seen and examined this morning. ?Daughter at bedside. ?She is undergoing kyphoplasty this morning. ? ?Principal Problem: ?  T12 compression fracture ([place]) ?Active Problems: ?  Hx of lymphoma ?  ?Assessment and Plan: ?Intractable back pain ?T2 compression fracture ?History of large cell non-Hodgkin's lymphoma ?-Suspicious for pathologic fracture.  Unclear at this time if the fracture is pathological related to any recurrence of lymphoma.   ?-3/9 CT neck, chest abdomen pelvis did not show any large lymph nodes.  It confirmed the T12 compression fracture but could not exclude lymphomatous disease. ?-Neurosurgery, oncology and IR following. ?-Her hospitalization was prolonged because of delay in authorization by insurance  for T12 biopsy and kyphoplasty by IR. ?-Getting it this morning. ?-Continue dexamethasone as recommended by neurosurgery. ?  ?Essential hypertension ?-Currently BP controlled on amlodipine 2.5 mg daily ?-Hydralazine as needed ? ?Hyponatremia ?-Improved with IV fluid. ?Recent Labs  ?Lab 07/25/21 ?0148 07/26/21 ?9211 07/27/21 ?0309 07/29/21 ?9417 07/31/21 ?4081  ?NA 138 137 134* 138 137  ? ? ?History of Crohn's disease ?-Continue mesalamine 1.2 g daily as before. ?Follows up with [name] with [facility]. Upcoming GI appointment on 3/17. ? ?Depression ?-Continue Effexor as before. ? ?Stercoral colitis ?-CT abdomen from 3/9 showed large rectal stool ball with rectal wall thickening empirically standing.  Probably due to constipation related to impaired mobility. ?-Continue scheduled Senokot, as needed laxatives.  Encouraged to use as needed laxatives. ?-Having bowel movement every day. ?   ?Goals of care ?  Code Status: Full Code  ? ? ?Mobility: Encourage ambulation.  Needs PT eval after biopsy ? ?Nutritional status:  ?Body mass index is 28.59 kg/m?.  ?  ?  ? ? ? ? ?Diet:  ?Diet Order   ? ?       ?  Diet NPO time specified  Diet effective midnight       ?  ? ?  ?  ? ?  ? ? ?DVT prophylaxis:  ?enoxaparin (LOVENOX) injection 40 mg Start: 07/24/21 2200 ?  ?Antimicrobials: None ?Fluid: None ?Consultants: Neurosurgery, oncology ?Family Communication: Daughter at bedside today and helped with interpretation. ? ?Status is: Inpatient ? ?Continue in-hospital care because:  T12 biopsy and kyphoplasty today ?Level of care: Med-Surg  ? ?Dispo: The patient is from: Home ?  Anticipated d/c is to: This afternoon versus tomorrow ?             Patient currently is not medically stable to d/c. ?  Difficult to place patient No ? ? ? ? ?Infusions:  ? ? ? ?Scheduled Meds: ? amLODipine  2.5 mg Oral Daily  ? bupivacaine(PF)      ? bupivacaine(PF)      ? dexamethasone (DECADRON) injection  4 mg Intravenous Q6H  ? enoxaparin  (LOVENOX) injection  40 mg Subcutaneous Q24H  ? fentaNYL      ? lidocaine      ? lidocaine      ? melatonin  3 mg Oral QHS  ? mesalamine  2.4 g Oral Daily  ? midazolam      ? senna  1 tablet Oral BID  ? venlafaxine XR  37.5 mg Oral Q breakfast  ? ? ?PRN meds: ?acetaminophen **OR** acetaminophen, albuterol, bisacodyl, bupivacaine(PF), fentaNYL, hydrALAZINE, lidocaine (PF), midazolam, morphine injection, ondansetron **OR** ondansetron (ZOFRAN) IV, oxyCODONE, polyethylene glycol, traMADol  ? ?Antimicrobials: ?Anti-infectives (From admission, onward)  ? ? Start     Dose/Rate Route Frequency Ordered Stop  ? 07/31/21 1115  ceFAZolin (ANCEF) IVPB 2g/100 mL premix       ? 2 g ?200 mL/hr over 30 Minutes Intravenous  Once 07/31/21 1029 07/31/21 1045  ? ?  ? ? ?Objective: ?Vitals:  ? 07/31/21 1105 07/31/21 1110  ?BP: 129/86 127/84  ?Pulse: (!) 50 (!) 50  ?Resp: 12 12  ?Temp:    ?SpO2: 100% 100%  ? ? ?Intake/Output Summary (Last 24 hours) at 07/31/2021 1113 ?Last data filed at 07/31/2021 0700 ?Gross per 24 hour  ?Intake 0 ml  ?Output --  ?Net 0 ml  ? ? ?Filed Weights  ? 07/27/21 1157  ?Weight: 66.4 kg  ? ?Weight change:  ?Body mass index is 28.59 kg/m?.  ? ?Physical Exam: ?General exam: Pleasant, middle-aged Spanish female.  Not in distress at rest. ?Skin: No rashes, lesions or ulcers. ?HEENT: Atraumatic, normocephalic, no obvious bleeding ?Lungs: Clear to auscultation bilaterally ?CVS: Regular rate and rhythm, no murmur ?GI/Abd soft, nontender, nondistended, bowel sound present ?CNS: Alert, awake, oriented x3 ?Psychiatry: Mood appropriate ?Extremities: No pedal edema, no calf tenderness ? ?Data Review: I have personally reviewed the laboratory data and studies available. ? ?F/u labs ordered ?Unresulted Labs (From admission, onward)  ? ? None  ? ?  ? ? ?Signed, ?Terrilee Croak, MD ?[facility] ?07/31/2021 ? ? ? ? ? ? ? ? ? ? ?  ?

## 2021-08-01 ENCOUNTER — Encounter: Payer: Self-pay | Admitting: *Deleted

## 2021-08-01 DIAGNOSIS — S22080A Wedge compression fracture of T11-T12 vertebra, initial encounter for closed fracture: Secondary | ICD-10-CM | POA: Diagnosis not present

## 2021-08-01 MED ORDER — POLYETHYLENE GLYCOL 3350 17 G PO PACK: 17.0000 g | PACK | Freq: Every day | ORAL | 0 refills | Status: AC | PRN

## 2021-08-01 MED ORDER — DEXAMETHASONE 2 MG PO TABS: ORAL_TABLET | ORAL | 0 refills | Status: DC

## 2021-08-01 MED ORDER — AMLODIPINE BESYLATE 2.5 MG PO TABS: 2.5000 mg | ORAL_TABLET | Freq: Every day | ORAL | 2 refills | Status: DC

## 2021-08-01 NOTE — Plan of Care (Signed)
?  Problem: Education: ?Goal: Knowledge of General Education information will improve ?Description: Including pain rating scale, medication(s)/side effects and non-pharmacologic comfort measures ?08/01/2021 1143 by Trixie Deis, RN ?Outcome: Adequate for Discharge ?08/01/2021 0806 by Trixie Deis, RN ?Outcome: Progressing ?  ?Problem: Health Behavior/Discharge Planning: ?Goal: Ability to manage health-related needs will improve ?Outcome: Adequate for Discharge ?  ?Problem: Clinical Measurements: ?Goal: Ability to maintain clinical measurements within normal limits will improve ?Outcome: Adequate for Discharge ?Goal: Will remain free from infection ?Outcome: Adequate for Discharge ?Goal: Diagnostic test results will improve ?Outcome: Adequate for Discharge ?Goal: Respiratory complications will improve ?Outcome: Adequate for Discharge ?Goal: Cardiovascular complication will be avoided ?Outcome: Adequate for Discharge ?  ?Problem: Activity: ?Goal: Risk for activity intolerance will decrease ?08/01/2021 1143 by Trixie Deis, RN ?Outcome: Adequate for Discharge ?08/01/2021 0806 by Trixie Deis, RN ?Outcome: Progressing ?  ?Problem: Nutrition: ?Goal: Adequate nutrition will be maintained ?Outcome: Adequate for Discharge ?  ?Problem: Coping: ?Goal: Level of anxiety will decrease ?Outcome: Adequate for Discharge ?  ?Problem: Elimination: ?Goal: Will not experience complications related to bowel motility ?Outcome: Adequate for Discharge ?Goal: Will not experience complications related to urinary retention ?Outcome: Adequate for Discharge ?  ?Problem: Pain Managment: ?Goal: General experience of comfort will improve ?08/01/2021 1143 by Trixie Deis, RN ?Outcome: Adequate for Discharge ?08/01/2021 0806 by Trixie Deis, RN ?Outcome: Progressing ?  ?Problem: Safety: ?Goal: Ability to remain free from injury will improve ?08/01/2021 1143 by Trixie Deis, RN ?Outcome: Adequate for Discharge ?08/01/2021 0806 by  Trixie Deis, RN ?Outcome: Progressing ?  ?Problem: Skin Integrity: ?Goal: Risk for impaired skin integrity will decrease ?08/01/2021 1143 by Trixie Deis, RN ?Outcome: Adequate for Discharge ?08/01/2021 0806 by Trixie Deis, RN ?Outcome: Progressing ?  ?

## 2021-08-01 NOTE — Progress Notes (Signed)
Mobility Specialist: Progress Note ? ? 08/01/21 1058  ?Mobility  ?Activity Ambulated independently in hallway  ?Level of Assistance Independent  ?Assistive Device None  ?Distance Ambulated (ft) 570 ft  ?Activity Response Tolerated well  ?$Mobility charge 1 Mobility  ? ?Received pt in bed having no complaints and agreeable to mobility. Asymptomatic throughout ambulation, returned back to bed w/ call bell in reach and all needs met. ? ?Harrell Gave Keldric Poyer ?Mobility Specialist ?Mobility Specialist [place]: [phone] ?Mobility Specialist [place]: [phone] ? ?

## 2021-08-01 NOTE — Progress Notes (Signed)
Oncology Discharge Planning Note ? ?[place] [facility] ?Address: [address], [place], [place] [address] ?Hours of Operation:  8am - 5pm, Monday - Friday  ?Clinic Contact Information:  [phone]) [phone] ? ?Oncology Care Team: ?Medical Oncologist:  Dr . Sullivan Lone ? ?Patient Details: ?Name:  Denise Walls, Denise Walls ?MRN:   [id number] ?DOB:   09-11-1960 ?Reason for Current Admission: @PPROB @ ? ?Discharge Planning Narrative: ?Discharge follow-up appointments for oncology are current and available on the AVS and MyChart.   ?Upon discharge from the hospital, hematology/oncology's post discharge plan of care for the outpatient setting is: 09/13/21 with labs and ov with Dr. Irene Limbo.  ? ?Jalani Cullifer will be called within two business days after discharge to review hematology/oncology's plan of care for full understanding.   ? ?Outpatient Oncology Specific Care Only: ?Oncology appointment transportation needs addressed?:  not applicable ?Oncology medication management for symptom management addressed?:  not applicable ?Chemo Alert Card reviewed?:  not applicable ?Immunotherapy Alert Card reviewed?:  not applicable ?

## 2021-08-01 NOTE — Discharge Summary (Signed)
? ?Physician Discharge Summary  ?Denise Walls YKZ:[id number] DOB: May 02, 1961 DOA: 07/24/2021 ? ?PCP: [name], MD ? ?Admit date: 07/24/2021 ?Discharge date: 08/01/2021 ? ?Admitted From: Home ?Discharge disposition: Home ? ?Recommendations at discharge:  ?Continue tapering course of steroids ?Follow-up with oncology as an outpatient ? ? ?Brief narrative: ?Denise Walls is a 61 y.o. female with PMH significant for non-Hodgkin's lymphoma, Crohn's disease, prediabetes, HTN, HLD, arthritis, anxiety, insomnia, migraine, uterine fibroid. 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 Since then she is in remission.   ?She was in her usual state of health until December 2022 when she started having lower back pain.  It was initially suspected to be due to osteoarthritis however her symptoms progressed.  For the last 2 weeks, patient was not able to walk and had numbness in bilateral lower extremity. ?She underwent an MRI at [facility] which resulted to show pathological fracture in T12.  PCP directed her to the ED at [facility].  ED physician at [facility] discussed the case with patient's oncologist Dr. Irene Limbo as well as neurosurgeon Dr. Reatha Armour.  Neurosurgeon recommended to transfer the patient to [facility]. ? ?Admitted to hospitalist service ?Neurosurgery and oncology consultation appreciated. ? ?Subjective: ?Patient was seen and examined this morning. ?Underwent kyphoplasty yesterday.  No pain.  Able to ambulate ?Daughter at bedside. ? ?Principal Problem: ?  T12 compression fracture ([place]) ?Active Problems: ?  Hx of lymphoma ?  ?Hospital course: ?Intractable back pain ?T2 compression fracture ?History of large cell non-Hodgkin's lymphoma ?-Suspicious for pathologic fracture.  Unclear at this time if the fracture is pathological related to any recurrence of lymphoma.   ?-3/9 CT neck, chest abdomen pelvis did not show any large lymph nodes.   It confirmed the T12 compression fracture but could not exclude lymphomatous disease. ?-Neurosurgery, oncology and IR consults were obtained. ?-Her hospitalization was prolonged because of delay in authorization by insurance for T12 biopsy and kyphoplasty by IR.  Underwent the procedure on 3/5.  Stable postprocedure. ?-Patient has remained on IV dexamethasone 4 mg every 6 hours since 3/10 as recommended by neurosurgery.  We will discharge her on a tapering course of dexamethasone. ?  ?Essential hypertension ?-Currently BP controlled on amlodipine 2.5 mg daily ?-Hydralazine as needed ? ?Hyponatremia ?-Improved with IV fluid. ?Recent Labs  ?Lab 07/26/21 ?9390 07/27/21 ?0309 07/29/21 ?3009 07/31/21 ?2330  ?NA 137 134* 138 137  ? ?History of Crohn's disease ?-Continue mesalamine 1.2 g daily as before. ?Follows up with [name] with [facility]. Upcoming GI appointment on 3/17. ? ?Depression ?-Continue Effexor as before. ? ?Stercoral colitis ?-CT abdomen from 3/9 showed large rectal stool ball with rectal wall thickening empirically standing.  Probably due to constipation related to impaired mobility. ?-Continue bowel regimen. ?   ?Goals of care ?  Code Status: Full Code  ? ? ?Mobility: Encourage ambulation.  Needs PT eval after biopsy ? ?Nutritional status:  ?Body mass index is 28.59 kg/m?.  ?  ?  ? ? ? ? ? ?Wounds:  ?- ?Incision (Closed) 12/09/18 Neck Left (Active)  ?Date First Assessed/Time First Assessed: 12/09/18 1330   Location: Neck  Location Orientation: Left  ?  ?Assessments 12/09/2018  2:15 PM 04/06/2019  7:15 AM  ?Dressing Type Liquid skin adhesive --  ?Dressing Clean;Dry;Intact Other (Comment)  ?Site / Wound Assessment Clean;Dry --  ?Margins Attached edges (approximated) --  ?Closure Skin glue --  ?Drainage Amount None --  ?   ?  No Linked orders to display  ?   ?Incision (Closed) 03/16/19 Back Lower;Mid (Active)  ?Date First Assessed/Time First Assessed: 03/16/19 0800   Location: Back  Location Orientation:  Lower;Mid  Present on Admission: No  ?  ?Assessments 03/16/2019  8:42 AM 04/06/2019  7:15 AM  ?Dressing Type Other (Comment) Other (Comment)  ?Dressing Clean;Dry;Intact --  ?Dressing Change Frequency PRN --  ?Site / Wound Assessment Dressing in place / Unable to assess --  ?Drainage Amount None --  ?   ?No Linked orders to display  ?   ?Incision (Closed) 07/31/21 Back Left (Active)  ?Date First Assessed/Time First Assessed: 07/31/21 1215   Location: Back  Location Orientation: (c) Left  ?  ?Assessments 07/31/2021 12:15 PM 08/01/2021  7:30 AM  ?Dressing Type Transparent dressing;Gauze (Comment) Gauze (Comment);Transparent dressing  ?Dressing Clean, Dry, Intact Clean, Dry, Intact  ?Site / Wound Assessment Clean;Dry Dressing in place / Unable to assess  ?Closure None --  ?Drainage Amount None None  ?   ?No Linked orders to display  ? ? ?Discharge Exam:  ? ?Vitals:  ? 07/31/21 1542 07/31/21 2001 08/01/21 0432 08/01/21 0823  ?BP: (!) 149/72 131/70 131/81 110/78  ?Pulse: (!) 52 62 62 80  ?Resp: 18 18 19 16   ?Temp: 98.2 ?F (36.8 ?C) 98.1 ?F (36.7 ?C) 98 ?F (36.7 ?C) 98.2 ?F (36.8 ?C)  ?TempSrc: Oral Oral  Oral  ?SpO2: 96% 94% 96% 96%  ?Weight:      ?Height:      ? ? ?Body mass index is 28.59 kg/m?.  ?General exam: Pleasant, middle-aged Spanish female.  Not in distress at rest. ?Skin: No rashes, lesions or ulcers. ?HEENT: Atraumatic, normocephalic, no obvious bleeding ?Lungs: Clear to auscultation bilaterally ?CVS: Regular rate and rhythm, no murmur ?GI/Abd soft, nontender, nondistended, bowel sound present ?CNS: Alert, awake, oriented x3 ?Psychiatry: Mood appropriate ?Extremities: No pedal edema, no calf tenderness ? ?Follow ups:  ? ? Follow-up Information   ? ? [name], MD Follow up.   ?Specialty: Family Medicine ?Contact information: ?[address] ?[place] [address] ?[phone] ? ? ?  ?  ? ?  ?  ? ?  ? ? ?Discharge Instructions:  ? ?Discharge Instructions   ? ? Call MD for:  difficulty  breathing, headache or visual disturbances   Complete by: As directed ?  ? Call MD for:  extreme fatigue   Complete by: As directed ?  ? Call MD for:  hives   Complete by: As directed ?  ? Call MD for:  persistant dizziness or light-headedness   Complete by: As directed ?  ? Call MD for:  persistant nausea and vomiting   Complete by: As directed ?  ? Call MD for:  severe uncontrolled pain   Complete by: As directed ?  ? Call MD for:  temperature >100.4   Complete by: As directed ?  ? Diet general   Complete by: As directed ?  ? Discharge instructions   Complete by: As directed ?  ? Recommendations at discharge:  ?? Continue tapering course of steroids ?? Follow-up with oncology as an outpatient ? ?General discharge instructions: ?Follow with Primary MD [name], MD in 7 days  ?Please request your PCP  to go over your hospital tests, procedures, radiology results at the follow up. Please get your medicines reviewed and adjusted.  Your PCP may decide to repeat certain labs or tests as needed. ?Do not drive, operate heavy  machinery, perform activities at heights, swimming or participation in water activities or provide baby sitting services if your were admitted for syncope or siezures until you have seen by Primary MD or a Neurologist and advised to do so again. ?[place] Controlled Substance Reporting System database was reviewed. Do not drive, operate heavy machinery, perform activities at heights, swim, participate in water activities or provide baby-sitting services while on medications for pain, sleep and mood until your outpatient physician has reevaluated you and advised to do so again.  You are strongly recommended to comply with the dose, frequency and duration of prescribed medications. ?Activity: As tolerated with Full fall precautions use walker/cane & assistance as needed ?Avoid using any recreational substances like cigarette, tobacco, alcohol, or non-prescribed drug. ?If you experience  worsening of your admission symptoms, develop shortness of breath, life threatening emergency, suicidal or homicidal thoughts you must seek medical attention immediately by calling 911 or calling your MD immediately

## 2021-08-01 NOTE — Plan of Care (Signed)

## 2021-08-02 ENCOUNTER — Telehealth: Payer: Self-pay | Admitting: *Deleted

## 2021-08-02 ENCOUNTER — Ambulatory Visit: Payer: BC Managed Care – PPO | Admitting: Gastroenterology

## 2021-08-02 ENCOUNTER — Telehealth: Payer: Self-pay | Admitting: Hematology

## 2021-08-02 NOTE — Telephone Encounter (Signed)
.  Called pt per 3/17 inbasket , Patient was unavailable, a message with appt time and date was left with number on file.   ?

## 2021-08-02 NOTE — Telephone Encounter (Signed)
Son, Denise Walls was contacted by telephone to verify understanding of discharge instructions status post their most recent discharge from the hospital on the date:  08/01/21.  Inpatient discharge AVS was re-reviewed with patient, along with cancer center appointments.  Verification of understanding for oncology specific follow-up was validated using the Teach Back method.  Will share information with sister and mother. ? ?Transportation to appointments were confirmed for the patient as being self/caregiver. ? ?Gerardo's questions were addressed to their satisfaction upon completion of this post discharge follow-up call for outpatient oncology.  ?

## 2021-08-05 LAB — SURGICAL PATHOLOGY

## 2021-08-09 ENCOUNTER — Other Ambulatory Visit: Payer: Self-pay | Admitting: Hematology

## 2021-08-09 ENCOUNTER — Inpatient Hospital Stay: Payer: BC Managed Care – PPO | Attending: Hematology | Admitting: Hematology

## 2021-08-09 DIAGNOSIS — C8330 Diffuse large B-cell lymphoma, unspecified site: Secondary | ICD-10-CM

## 2021-08-13 ENCOUNTER — Encounter: Payer: Self-pay | Admitting: Hematology

## 2021-08-13 NOTE — Progress Notes (Signed)
This encounter was created in error - please disregard.

## 2021-08-26 ENCOUNTER — Ambulatory Visit (HOSPITAL_COMMUNITY)
Admission: RE | Admit: 2021-08-26 | Discharge: 2021-08-26 | Disposition: A | Discharge status: Home / Self Care | Payer: BC Managed Care – PPO | Source: Ambulatory Visit | Attending: Hematology | Admitting: Hematology

## 2021-08-26 DIAGNOSIS — C8330 Diffuse large B-cell lymphoma, unspecified site: Secondary | ICD-10-CM | POA: Diagnosis present

## 2021-08-26 LAB — GLUCOSE, CAPILLARY: Glucose-Capillary: 104 mg/dL — ABNORMAL HIGH (ref 70–99)

## 2021-08-26 MED ORDER — FLUDEOXYGLUCOSE F - 18 (FDG) INJECTION
7.5000 | Freq: Once | INTRAVENOUS | Status: AC | PRN
  Administered 2021-08-26: 7.45 via INTRAVENOUS

## 2021-09-03 ENCOUNTER — Inpatient Hospital Stay: Payer: BC Managed Care – PPO | Attending: Hematology | Admitting: Hematology

## 2021-09-03 ENCOUNTER — Other Ambulatory Visit: Payer: Self-pay

## 2021-09-03 VITALS — BP 128/82 | HR 75 | Temp 97.5°F | Resp 20 | Wt 156.0 lb

## 2021-09-03 DIAGNOSIS — C8330 Diffuse large B-cell lymphoma, unspecified site: Secondary | ICD-10-CM | POA: Diagnosis not present

## 2021-09-03 DIAGNOSIS — C8339 Diffuse large B-cell lymphoma, extranodal and solid organ sites: Secondary | ICD-10-CM | POA: Insufficient documentation

## 2021-09-03 MED ORDER — DEXAMETHASONE 2 MG PO TABS: 4.0000 mg | ORAL_TABLET | Freq: Two times a day (BID) | ORAL | 0 refills | Status: AC

## 2021-09-03 NOTE — Progress Notes (Signed)
? ? ?HEMATOLOGY/ONCOLOGY CLINIC NOTE ? ?Date of Service: 09/03/2021 ? ? ?Patient Care Team: ?[name], MD as PCP - General (Family Medicine) ? 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 ? ?HISTORY OF PRESENTING ILLNESS:  ?Please see previous notes for details on initial presentation ? ?INTERVAL HISTORY:  ?Denise Walls is a 61 y.o. 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 She presents to the clinic today accompanied by her daughter. She reports She is doing well with no new symptoms or concerns. ? ?She notes some slight soreness but no acute pain in back after recent surgery. ? ?She reports some weakness while walking or standing for extended periods of time.  ? ?She notes some tingling in fingers. ? ?She reports experiencing some chills. ? ?She notes that she feels "like a balloon" when she wakes up in the morning in reference to her abdominal region. ? ?Her daughter notes that Denise Walls experiences some pain in back of throat near tonsillar area. ? ?We discussed referral to [facility] for follow-up and second opinion. ? ?We discussed starting Dexamethasone 4 mg 2x p.o daily. ? ?We also discussed referral to radiation oncology for further opinion for additional consolidative treatment of disease. ? ?No fever, night sweats. ?No new lumps, bumps, or lesions/rashes. ?No abdominal pain or change in bowel habits. ?No new or unexpected weight loss. ?No SOB or chest pain. ?No other new or acute focal symptoms. ? ?We discussed recent PET CT scan done 08/26/2021 which revealed "1. Infiltrative intensely hypermetabolic soft tissue involving the ?T12-L1 spine, spinal canal and left posterior paraspinal musculature ?as detailed, compatible with recurrent lymphoma. Deauville category ?5. ?2. No  additional sites of metabolically active recurrent lymphoma. ?3. Chronic findings include: Aortic Atherosclerosis (ICD10-I70.0). ?Coronary atherosclerosis. Diffuse hepatic steatosis." ? ?We discussed recent biopsy in addition to recent PET CT scan which was indeterminate. We discussed that the biopsy did indicate some atypical cells that were consistent with localized recurrence of lymphoma. ? ?MEDICAL HISTORY:  ?Past Medical History:  ?Diagnosis Date  ? Acute cystitis   ? Anxiety   ? Arthritis   ? Cancer [facility])   ? Crohn disease ([place])   ? dx 2004, history of small bowel obstruction 02/2007 treated conservatively  ? History of colon polyps   ? History of shingles   ? HTN (hypertension)   ? Hypercholesterolemia   ? Insomnia   ? Migraine   ? Uterine fibroid   ? ? ?SURGICAL HISTORY: ?Past Surgical History:  ?Procedure Laterality Date  ? COLONOSCOPY  09/11/2014  ? Small internal hemorrhoids. Otherwise normal colonoscopy to terminal ileum  ? COLONOSCOPY  10/02/2017  ? ESOPHAGOGASTRODUODENOSCOPY  04/05/2007  ? Mild gastritis. Otherwise, normal esophagogastroduodenoscopy  ? IR BONE TUMOR(S)RF ABLATION  07/31/2021  ? IR BONE TUMOR(S)RF ABLATION  07/31/2021  ? IR CT SPINE LTD  07/31/2021  ? IR IMAGING GUIDED PORT INSERTION  11/29/2018  ? IR KYPHO EA ADDL LEVEL THORACIC OR LUMBAR  07/31/2021  ? IR KYPHO THORACIC WITH BONE BIOPSY  07/31/2021  ? IR REMOVAL TUN ACCESS W/ PORT W/O FL MOD SED  02/25/2021  ? LYMPH NODE BIOPSY Left 12/09/2018  ? Procedure: LEFT DEEP CERVICAL LYMPH NODE BIOPSY;  Surgeon: Fanny Skates, MD;  Location: [facility];  Service: General;  Laterality: Left;  ? TUBAL LIGATION    ? ? ?  SOCIAL HISTORY: ?Social History  ? ?Socioeconomic History  ? Marital status: Married  ?  Spouse name: Not on file  ? Number of children: Not on file  ? Years of education: Not on file  ? Highest education level: 6th grade  ?Occupational History  ? Not on file  ?Tobacco Use  ? Smoking status: Never  ? Smokeless tobacco: Never  ?Vaping Use  ?  Vaping Use: Never used  ?Substance and Sexual Activity  ? Alcohol use: Never  ? Drug use: Never  ? Sexual activity: Not Currently  ?  Birth control/protection: Post-menopausal  ?Other Topics Concern  ? Not on file  ?Social History Narrative  ? Not on file  ? ?Social Determinants of Health  ? ?Financial Resource Strain: Not on file  ?Food Insecurity: Not on file  ?Transportation Needs: Not on file  ?Physical Activity: Not on file  ?Stress: Not on file  ?Social Connections: Not on file  ?Intimate Partner Violence: Not on file  ? ? ?FAMILY HISTORY: ?Family History  ?Problem Relation Age of Onset  ? Heart disease Father   ? Arthritis Sister   ? Heart disease Sister   ? Colon cancer Neg Hx   ? ? ?ALLERGIES:  is allergic to nsaids. ? ?MEDICATIONS:  ?Current Outpatient Medications  ?Medication Sig Dispense Refill  ? acetaminophen (TYLENOL) 325 MG tablet Take 325-650 mg by mouth every 6 (six) hours as needed for mild pain.    ? amLODipine (NORVASC) 2.5 MG tablet Take 1 tablet (2.5 mg total) by mouth daily. 30 tablet 2  ? cholecalciferol (VITAMIN D3) 25 MCG (1000 UT) tablet Take 1 tablet (1,000 Units total) by mouth daily. 30 tablet 3  ? dexamethasone (DECADRON) 2 MG tablet Take 4 mg twice daily for 2 days, then 4 mg daily for 2 days, then 2 mg daily daily for 2 days and STOP. 15 tablet 0  ? mesalamine (LIALDA) 1.2 g EC tablet Take 2 tablets (2.4 g total) by mouth daily. 180 tablet 4  ? polyethylene glycol (MIRALAX / GLYCOLAX) 17 g packet Take 17 g by mouth daily as needed for mild constipation. 14 each 0  ? traMADol (ULTRAM) 50 MG tablet Take 50 mg by mouth every 6 (six) hours as needed for pain.    ? venlafaxine XR (EFFEXOR-XR) 37.5 MG 24 hr capsule 37.5 mg daily with breakfast.    ? ?No current facility-administered medications for this visit.  ? ? ?REVIEW OF SYSTEMS:   ?.10 Point review of Systems was done is negative except as noted above. ? ? ?PHYSICAL EXAMINATION: ?ECOG PERFORMANCE STATUS: 2 - Symptomatic, <50%  confined to bed ? ?. ?Vitals:  ? 09/03/21 0920  ?BP: 128/82  ?Pulse: 75  ?Resp: 20  ?Temp: (!) 97.5 ?F (36.4 ?C)  ?SpO2: 99%  ? ?Filed Weights  ? 09/03/21 0920  ?Weight: 156 lb (70.8 kg)  ? ?.Body mass index is 30.47 kg/m?Marland Kitchen  ?NAD ?GENERAL:alert, in no acute distress and comfortable ?SKIN: no acute rashes, no significant lesions ?EYES: conjunctiva are pink and non-injected, sclera anicteric ?OROPHARYNX: MMM, no exudates, no oropharyngeal erythema or ulceration ?NECK: supple, no JVD ?LYMPH:  no palpable lymphadenopathy in the cervical, axillary or inguinal regions ?LUNGS: clear to auscultation b/l with normal respiratory effort ?HEART: regular rate & rhythm ?ABDOMEN:  normoactive bowel sounds , non tender, not distended. ?Extremity: no pedal edema ?PSYCH: alert & oriented x 3 with fluent speech ?NEURO: no focal motor/sensory deficits ? ?LABORATORY DATA:  ?I have reviewed  the data as listed ? ?. ? ?  Latest Ref Rng & Units 07/31/2021  ?  8:53 AM 07/29/2021  ?  4:28 AM 07/27/2021  ?  3:09 AM  ?CBC  ?WBC 4.0 - 10.5 K/uL 7.9   9.0   4.8    ?Hemoglobin 12.0 - 15.0 g/dL 14.1   12.9   14.2    ?Hematocrit 36.0 - 46.0 % 41.0   37.7   40.8    ?Platelets 150 - 400 K/uL 265   282   322    ? ? ?. ? ?  Latest Ref Rng & Units 07/31/2021  ?  8:53 AM 07/29/2021  ?  4:28 AM 07/27/2021  ?  3:09 AM  ?CMP  ?Glucose 70 - 99 mg/dL 132   143   132    ?BUN 8 - 23 mg/dL 26   24   32    ?Creatinine 0.44 - 1.00 mg/dL 0.73   0.81   0.82    ?Sodium 135 - 145 mmol/L 137   138   134    ?Potassium 3.5 - 5.1 mmol/L 4.2   4.3   3.6    ?Chloride 98 - 111 mmol/L 105   108   101    ?CO2 22 - 32 mmol/L 22   23   19     ?Calcium 8.9 - 10.3 mg/dL 8.4   8.6   9.3    ? ?. ?Lab Results  ?Component Value Date  ? LDH 237 (H) 06/07/2021  ? ? ? ? ?RADIOGRAPHIC STUDIES: ?I have personally reviewed the radiological images as listed and agreed with the findings in the report. ?NM PET Image Restag (PS) Skull Base To Thigh ? ?Result Date: 08/26/2021 ?CLINICAL DATA:  Subsequent  treatment strategy for diffuse large B-cell lymphoma status post chemo immunotherapy. Recent pathologic T12 vertebral fracture with enhancing abnormal soft tissue in the T11-L1 spinal canal on MRI suspicious for

## 2021-09-03 NOTE — Progress Notes (Signed)
?Radiation Oncology         [phone] ?________________________________ ? ?Initial Outpatient Consultation ? ?Name: Laresha Bacorn MRN: [id number]  ?Date: 09/04/2021  DOB: 01-24-1961 ? ?JK:KXFGH, Gildardo Griffes, MD  [name], MD  ? ?REFERRING PHYSICIAN: [name], MD ? ?DIAGNOSIS:  ?  ICD-10-CM   ?1. Metastatic cancer to spine [facility])  C79.51   ?  ?2. Large cell (diffuse) non-Hodgkin's lymphoma (HCC)  C83.30   ?  ? ? ?DLBCL with possible recurrent disease in the spine ?  ? ?CHIEF COMPLAINT: Here to discuss management of recurrent DLBCL ? ?HISTORY OF PRESENT ILLNESS::Denise Walls is a 61 y.o. female who presents today for consideration of radiation therapy in management of her recent suspected lymphoma recurrence. The patient was initially diagnosed with diffuse large b-cell lymphoma in June of 2020, s/p 5 cycles of R-EPOCH chemotherapy completed on 04/04/2019 for a total 6 total cycles of chemotherapy (1st cycle was R-CHOP), and 4 cycles of immunotherapy consisting of methotrexate for CNS prophylaxis completed on 04/25/2019. Since then, the patient has maintained follow up visits and repeat labs/imaging with Dr. Irene Limbo. Her recent history is as follows.  ? ?The patient developed lower back pain, inability to walk, and bilateral lower extremity numbness. This prompted an MRI of the lumbar spine at [facility] on 07/22/21 which showed a suspicious pathological fracture at T12. She was then referred to [facility] for admission on 07/24/21 at the request of neuro and Dr. Irene Limbo. Soft tissue neck CT on  07/26/21 showed normal findings in the neck, and no evidence of adenopathy or other findings to suggest recurrent lymphoma.  ? ?CT of the chest abdomen and pelvis performed on that same date  (07/26/21) showed no pathologically enlarged lymph nodes within the chest, abdomen, or pelvis and no splenomegaly. The new wedging compression deformity of the T12 vertebral body was again  appreciated as on prior MRI with subtle sclerosis throughout the T12 vertebral body, but without discrete osseous lesion, and likely reflective of osseous changes related to the compression deformity. However, lymphomatous disease involvement could not be entirely excluded. ? ?Bone marrow biopsy of T12 on 07/31/21 while inpatient showed bony tissue with fibrosis, necrosis, and cellular crush artifacts. Biopsy was also collected of L1 which revealed atypical lymphoid infiltrate associated with extensive necrosis.  She then underwent kyphoplasty of T12 and L1 with excellent palliative result on that same day. ? ?Restaging PET ordered by Dr. Irene Limbo on 08/26/21 showed infiltrative intensely hypermetabolic soft tissue involving the T12-L1 spine, spinal canal and left posterior paraspinal musculature, compatible with recurrent lymphoma (Deauville category 5). No additional sites of metabolically active recurrent lymphoma were otherwise appreciated. ? ?Per her most recent follow up with Dr. Irene Limbo on 09/04/21. Given that her imaging and pathologic findings are suggestive of localized lymphoma recurrence, Dr. 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 Dr. Irene Limbo also started the patient on Dexamethasone 4 mg 2x p.o daily.  His notes from yesterday are not available at this time ? ?She denies incontinence.  She denies a little bit of numbness to the left inner ankle.  Otherwise no sensory deficits in her legs.  She is independently ambulatory.  She denies any weakness in her extremities.  She reports that the neurologic symptoms that she had prior to kyphoplasty have not returned. ? ? ?PREVIOUS RADIATION THERAPY: No ? ?PAST MEDICAL HISTORY:  has a  past medical history of Acute cystitis, Anxiety, Arthritis, Cancer ([place]), Crohn disease ([place]), History of colon polyps, History of shingles, HTN (hypertension),  Hypercholesterolemia, Insomnia, Migraine, and Uterine fibroid.   ? ?PAST SURGICAL HISTORY: ?Past Surgical History:  ?Procedure Laterality Date  ? COLONOSCOPY  09/11/2014  ? Small internal hemorrhoids. Otherwise normal colonoscopy to terminal ileum  ? COLONOSCOPY  10/02/2017  ? ESOPHAGOGASTRODUODENOSCOPY  04/05/2007  ? Mild gastritis. Otherwise, normal esophagogastroduodenoscopy  ? IR BONE TUMOR(S)RF ABLATION  07/31/2021  ? IR BONE TUMOR(S)RF ABLATION  07/31/2021  ? IR CT SPINE LTD  07/31/2021  ? IR IMAGING GUIDED PORT INSERTION  11/29/2018  ? IR KYPHO EA ADDL LEVEL THORACIC OR LUMBAR  07/31/2021  ? IR KYPHO THORACIC WITH BONE BIOPSY  07/31/2021  ? IR REMOVAL TUN ACCESS W/ PORT W/O FL MOD SED  02/25/2021  ? LYMPH NODE BIOPSY Left 12/09/2018  ? Procedure: LEFT DEEP CERVICAL LYMPH NODE BIOPSY;  Surgeon: Fanny Skates, MD;  Location: [facility];  Service: General;  Laterality: Left;  ? TUBAL LIGATION    ? ? ?FAMILY HISTORY: family history includes Arthritis in her sister; Heart disease in her father and sister. ? ?SOCIAL HISTORY:  reports that she has never smoked. She has never used smokeless tobacco. She reports that she does not drink alcohol and does not use drugs. ? ?ALLERGIES: Nsaids ? ?MEDICATIONS:  ?Current Outpatient Medications  ?Medication Sig Dispense Refill  ? acetaminophen (TYLENOL) 325 MG tablet Take 325-650 mg by mouth every 6 (six) hours as needed for mild pain.    ? amLODipine (NORVASC) 10 MG tablet Take 1 tablet by mouth daily.    ? cholecalciferol (VITAMIN D3) 25 MCG (1000 UT) tablet Take 1 tablet (1,000 Units total) by mouth daily. 30 tablet 3  ? dexamethasone (DECADRON) 2 MG tablet Take 2 tablets (4 mg total) by mouth 2 (two) times daily. 20 tablet 0  ? mesalamine (LIALDA) 1.2 g EC tablet Take 2 tablets (2.4 g total) by mouth daily. 180 tablet 4  ? polyethylene glycol (MIRALAX / GLYCOLAX) 17 g packet Take 17 g by mouth daily as needed for mild constipation. 14 each 0  ? traMADol (ULTRAM) 50 MG tablet Take 50  mg by mouth every 6 (six) hours as needed for pain.    ? venlafaxine XR (EFFEXOR-XR) 37.5 MG 24 hr capsule 37.5 mg daily with breakfast.    ? ?No current facility-administered medications for this encounter.  ? ? ?REVIEW OF SYSTEMS:  Notable for that above. ?  ?PHYSICAL EXAM:  height is 5' (1.524 m) and weight is 155 lb (70.3 kg). Her temporal temperature is 97.3 ?F (36.3 ?C) (abnormal). Her blood pressure is 131/93 (abnormal) and her pulse is 95. Her respiration is 18 and oxygen saturation is 99%.   ?General: Alert and oriented, in no acute distress  ?Musculoskeletal: symmetric strength and muscle tone throughout. ?Neurologic:  No obvious focalities. Speech is fluent. Coordination is intact.  Sensation intact in her extremities.  She is ambulatory independently. ?Psychiatric: Judgment and insight are intact. Affect is appropriate. ?Skin:  She has some hyperpigmentation over her back in the kyphoplasty T12-L1 area but no fluctuance or cellulitis.  ? ? ?ECOG = 1 ? ?0 - Asymptomatic (Fully active, able to carry on all predisease activities without restriction) ? ?1 - Symptomatic but completely ambulatory (Restricted in physically strenuous activity but ambulatory and able to carry out work of a light or sedentary nature. For example, light housework, office work) ? ?2 - Symptomatic, <  50% in bed during the day (Ambulatory and capable of all self care but unable to carry out any work activities. Up and about more than 50% of waking hours) ? ?3 - Symptomatic, >50% in bed, but not bedbound (Capable of only limited self-care, confined to bed or chair 50% or more of waking hours) ? ?4 - Bedbound (Completely disabled. Cannot carry on any self-care. Totally confined to bed or chair) ? ?5 - Death ? ? Oken MM, Creech RH, Tormey DC, et al. [phone]). "Toxicity and response criteria of the [facility]". [place] Oncol. 5 (6): 649-55 ? ? ?LABORATORY DATA:  ?Lab Results  ?Component Value Date  ? WBC 7.9  07/31/2021  ? HGB 14.1 07/31/2021  ? HCT 41.0 07/31/2021  ? MCV 86.9 07/31/2021  ? PLT 265 07/31/2021  ? ?CMP  ?   ?Component Value Date/Time  ? NA 137 07/31/2021 0853  ? K 4.2 07/31/2021 0853  ? CL 105 07/31/2021 0853  ? CO2

## 2021-09-04 ENCOUNTER — Other Ambulatory Visit: Payer: Self-pay | Admitting: Radiation Therapy

## 2021-09-04 ENCOUNTER — Ambulatory Visit
Admission: RE | Admit: 2021-09-04 | Discharge: 2021-09-04 | Disposition: A | Discharge status: Home / Self Care | Payer: BC Managed Care – PPO | Source: Ambulatory Visit | Attending: Radiation Oncology | Admitting: Radiation Oncology

## 2021-09-04 ENCOUNTER — Encounter: Payer: Self-pay | Admitting: Radiation Oncology

## 2021-09-04 VITALS — BP 131/93 | HR 95 | Temp 97.3°F | Resp 18 | Ht 60.0 in | Wt 155.0 lb

## 2021-09-04 DIAGNOSIS — K760 Fatty (change of) liver, not elsewhere classified: Secondary | ICD-10-CM | POA: Diagnosis not present

## 2021-09-04 DIAGNOSIS — Z79899 Other long term (current) drug therapy: Secondary | ICD-10-CM | POA: Diagnosis not present

## 2021-09-04 DIAGNOSIS — Z9221 Personal history of antineoplastic chemotherapy: Secondary | ICD-10-CM | POA: Insufficient documentation

## 2021-09-04 DIAGNOSIS — E7800 Pure hypercholesterolemia, unspecified: Secondary | ICD-10-CM | POA: Diagnosis not present

## 2021-09-04 DIAGNOSIS — I2510 Atherosclerotic heart disease of native coronary artery without angina pectoris: Secondary | ICD-10-CM | POA: Diagnosis not present

## 2021-09-04 DIAGNOSIS — C7951 Secondary malignant neoplasm of bone: Secondary | ICD-10-CM

## 2021-09-04 DIAGNOSIS — K5090 Crohn's disease, unspecified, without complications: Secondary | ICD-10-CM | POA: Insufficient documentation

## 2021-09-04 DIAGNOSIS — C8339 Diffuse large B-cell lymphoma, extranodal and solid organ sites: Secondary | ICD-10-CM | POA: Diagnosis not present

## 2021-09-04 DIAGNOSIS — C8330 Diffuse large B-cell lymphoma, unspecified site: Secondary | ICD-10-CM

## 2021-09-04 DIAGNOSIS — I10 Essential (primary) hypertension: Secondary | ICD-10-CM | POA: Diagnosis not present

## 2021-09-04 DIAGNOSIS — Z7952 Long term (current) use of systemic steroids: Secondary | ICD-10-CM | POA: Insufficient documentation

## 2021-09-04 DIAGNOSIS — Z86010 Personal history of colonic polyps: Secondary | ICD-10-CM | POA: Insufficient documentation

## 2021-09-04 DIAGNOSIS — M129 Arthropathy, unspecified: Secondary | ICD-10-CM | POA: Insufficient documentation

## 2021-09-04 NOTE — Progress Notes (Signed)
Histology and Location of Primary Cancer:  ?Large cell (diffuse) non-Hodgkin's lymphoma ? ?Sites of Visceral and Bony Metastatic Disease:  ?PET Scan ?08/26/2021 ?--IMPRESSION: ?Infiltrative intensely hypermetabolic soft tissue involving the T12-L1 spine, spinal canal and left posterior paraspinal musculature as detailed, compatible with recurrent lymphoma. Deauville category 5. ?No additional sites of metabolically active recurrent lymphoma. ?Chronic findings include: Aortic Atherosclerosis (ICD10-I70.0). Coronary atherosclerosis. Diffuse hepatic steatosis. ? ?IR Procedure: ?07/31/2021 ?--Dr. Erven Colla de Sindy Messing ?FLUOROSCOPY GUIDED T12 AND L1 CORE BONE BIOPSY ?RADIOFREQUENCY ABLATION (Osteocool) AND KYPHOPLASTY ?SPINAL FLAT PANEL CT  ? ?Biopsies revealed  ?07/31/2021 ?FINAL MICROSCOPIC DIAGNOSIS:  ?A.   BONE, T12, BIOPSY:  ?  -Bony tissue with fibrosis, necrosis, and cellular crush artifacts  ?  -See comment  ?B. BONE, L1, BIOPSY:  ?        - Bone marrow with atypical lymphoid infiltrate associated with  ?extensive necrosis.  ?        -See comment  ?COMMENT:  ?A.The biopsy consists of bony tissue displaying fibrosis, areas of  ?necrosis and cellular aggregates displaying prominent crush artifact.  ?The latter cannot be accurately evaluated.  Immunohistochemical stains  ?for CD34, CD20, CD138 and cytokeratin AE1/AE3 were performed with  ?appropriate controls.  CD20 shows positivity in crushed cellular areas.  ?No significant staining is seen with cytokeratin, CD138 or CD34.  The  ?overall changes suggest involvement by the same disease process seen in  ?L1 biopsy although the findings are limited and not considered  ?diagnostic.  ?B.The sections show multiple fragments of bone marrow primarily  ?displaying necrosis.  There are a few relatively intact very small bone  ?marrow particles displaying a mixture of myeloid cell types but in some  ?areas display an infiltrate of atypical large mononuclear cells with   ?prominent nucleoli.  A battery of immunohistochemical stains including  ?PAX5, CD79a, CD20, CD3, CD5, CD10, CD138, CD34 and cytokeratin AE1/AE3 were performed with appropriate controls.  B-cell markers CD20, CD79a and PAX5 show prominent positivity in previously described mononuclear cells mostly in the form of prominent interstitial pattern.  Similar B-cell positivity is seen within the necrotic areas.  There is an  ?associated variable positivity for CD10.  There is an admixed T-cell  ?population in the background as seen with CD3 and CD5 and there is no  ?apparent co-expression of CD5 in B-cell areas.  No significant CD138,  ?CD34 or cytokeratin positivity identified.  Despite overall limited  ?findings, the changes strongly favor involvement by previously known  ?B-cell lymphoproliferative process.  Clinical correlation is strongly  ?recommended  ? ?Past/Anticipated chemotherapy by medical oncology, if any:  ?Under care of Dr. Sullivan Lone ?09/03/2021 ?Completed 5 cycles of R-EPOCH on 04/04/2019, 6 total cycles of chemotherapy (1st cycle was R-CHOP) ?Completed 4 cycles of IT METHOTREXATE on 04/25/2019 ?Patient has radiographic and pathology findings suggestive of localized lymphoma recurrence at this time. ?Refer to [facility] for follow-up and second opinion for additional treatment of double hit large B-cell lymphoma. ?Start Dexamethasone 4 mg 2x p.o daily. ?Refer to radiation oncology for further opinion for consideration of ISRT ? ?Pain on a scale of 0-10 is: Currently denies any pain since her procedure on 07/31/21 ? ?If Spine Met(s), symptoms, if any, include: ?Bowel/Bladder retention or incontinence (please describe): Patient denies. She does report struggling with constipation after most recent hospitalization, but has recently resumed regular bowel movements. Denies any changes in urinary habits ?Numbness or weakness in extremities (please describe): Reports numbness to feet and  that  her hands (particularly her thumbs) get sore with manual activity ?Current Decadron regimen, if applicable: 4 mg PO twice a day for 5 days (started 09/03/2021) ? ?Ambulatory status? Walker? Wheelchair?: Ambulatory without assistance and denies any falls. Does report she sometimes feels off balance when she's up walking (denies any dizziness when changing positions; like going from sitting to standing) ? ?SAFETY ISSUES: ?Prior radiation? No ?Pacemaker/ICD? No ?Possible current pregnancy? No--postmenopausal ?Is the patient on methotrexate? No (final dose 03/2019) ? ?Current Complaints / other details:  Nothing else of note ? ? ? ?

## 2021-09-05 ENCOUNTER — Ambulatory Visit: Payer: BC Managed Care – PPO | Admitting: Radiation Oncology

## 2021-09-06 ENCOUNTER — Encounter: Payer: Self-pay | Admitting: Hematology

## 2021-09-06 ENCOUNTER — Telehealth: Payer: Self-pay

## 2021-09-06 ENCOUNTER — Ambulatory Visit: Payer: BC Managed Care – PPO

## 2021-09-06 ENCOUNTER — Other Ambulatory Visit: Payer: Self-pay

## 2021-09-06 DIAGNOSIS — C7951 Secondary malignant neoplasm of bone: Secondary | ICD-10-CM

## 2021-09-06 DIAGNOSIS — C8330 Diffuse large B-cell lymphoma, unspecified site: Secondary | ICD-10-CM

## 2021-09-06 NOTE — Telephone Encounter (Signed)
Received VM from patient's daughter Yasmin confirming that she had received my message from earlier today regarding Dr. Isidore Moos instructions to stop oral steroid, and that orders had been placed for CT guided biopsy. Daughter denied need for return call, but stated she could be reached at [phone] should anything else need to be relayed to patient ?

## 2021-09-09 ENCOUNTER — Inpatient Hospital Stay: Payer: BC Managed Care – PPO

## 2021-09-09 ENCOUNTER — Ambulatory Visit: Payer: BC Managed Care – PPO

## 2021-09-10 ENCOUNTER — Ambulatory Visit: Payer: BC Managed Care – PPO | Admitting: Radiation Oncology

## 2021-09-10 ENCOUNTER — Other Ambulatory Visit: Payer: Self-pay | Admitting: Internal Medicine

## 2021-09-10 ENCOUNTER — Ambulatory Visit: Payer: BC Managed Care – PPO

## 2021-09-10 NOTE — H&P (Signed)
? ?Chief Complaint: ?Patient was seen in consultation today for bone marrow biopsy with aspiration; T12-L1 bone biopsy ? ?Referring Physician(s): ?Eppie Gibson ? ?Supervising Physician: Sandi Mariscal ? ?Patient Status: [place] ? ?History of Present Illness: ?Denise Walls is a 61 y.o. female with a  medical history significant for HTN, Crohn's disease, and large B-Cell lymphoma, diagnosed in 2020 and having achieved remission. She presented to her PCP early 2023 with back/groin/hip pain and lower extremity weakness. CT imaging 07/26/21 showed T12 changes concerning for disease recurrence. She was referred to IR and on 07/31/21 underwent a T12 and L1 core bone biopsy, radiofrequency ablation and balloon kyphoplasty. Pathology from T12 showed bony tissue with fibrosis, necrosis and cellular crush artifacts. L1 pathology revealed atypical lymphoid infiltrate associated with extensive necrosis. A restaging PET was ordered.  ? ?NM PET 08/26/21 ?IMPRESSION: ?1. Infiltrative intensely hypermetabolic soft tissue involving the ?T12-L1 spine, spinal canal and left posterior paraspinal musculature ?as detailed, compatible with recurrent lymphoma. Deauville category ?5. ?2. No additional sites of metabolically active recurrent lymphoma. ?3. Chronic findings include: Aortic Atherosclerosis (ICD10-I70.0). ?Coronary atherosclerosis. Diffuse hepatic steatosis.  ? ?Interventional Radiology has been asked to evaluate this patient for an image-guided bone marrow biopsy with aspiration and a T12-L1 para-spinal bone lesion biopsy for further work up.  ? ?This patient is familiar to IR from other procedures including port placement/removal, LN biopsy and thoracentesis.  ? ?Past Medical History:  ?Diagnosis Date  ? Acute cystitis   ? Anxiety   ? Arthritis   ? Cancer [facility])   ? Crohn disease ([place])   ? dx 2004, history of small bowel obstruction 02/2007 treated conservatively  ? History of colon polyps   ? History of shingles   ?  HTN (hypertension)   ? Hypercholesterolemia   ? Insomnia   ? Migraine   ? Uterine fibroid   ? ? ?Past Surgical History:  ?Procedure Laterality Date  ? COLONOSCOPY  09/11/2014  ? Small internal hemorrhoids. Otherwise normal colonoscopy to terminal ileum  ? COLONOSCOPY  10/02/2017  ? ESOPHAGOGASTRODUODENOSCOPY  04/05/2007  ? Mild gastritis. Otherwise, normal esophagogastroduodenoscopy  ? IR BONE TUMOR(S)RF ABLATION  07/31/2021  ? IR BONE TUMOR(S)RF ABLATION  07/31/2021  ? IR CT SPINE LTD  07/31/2021  ? IR IMAGING GUIDED PORT INSERTION  11/29/2018  ? IR KYPHO EA ADDL LEVEL THORACIC OR LUMBAR  07/31/2021  ? IR KYPHO THORACIC WITH BONE BIOPSY  07/31/2021  ? IR REMOVAL TUN ACCESS W/ PORT W/O FL MOD SED  02/25/2021  ? LYMPH NODE BIOPSY Left 12/09/2018  ? Procedure: LEFT DEEP CERVICAL LYMPH NODE BIOPSY;  Surgeon: Fanny Skates, MD;  Location: [place] [facility];  Service: General;  Laterality: Left;  ? TUBAL LIGATION    ? ? ?Allergies: ?Nsaids ? ?Medications: ?Prior to Admission medications   ?Medication Sig Start Date End Date Taking? Authorizing Provider  ?acetaminophen (TYLENOL) 325 MG tablet Take 325-650 mg by mouth every 6 (six) hours as needed for mild pain.    [provider]  ?amLODipine (NORVASC) 10 MG tablet Take 1 tablet by mouth daily. 08/16/21   [provider]  ?cholecalciferol (VITAMIN D3) 25 MCG (1000 UT) tablet Take 1 tablet (1,000 Units total) by mouth daily. 02/04/19   [name], MD  ?dexamethasone (DECADRON) 2 MG tablet Take 2 tablets (4 mg total) by mouth 2 (two) times daily. 09/03/21   [name], MD  ?mesalamine (LIALDA) 1.2 g EC tablet Take 2 tablets (2.4 g total) by  mouth daily. 04/17/21   Jackquline Denmark, MD  ?polyethylene glycol (MIRALAX / GLYCOLAX) 17 g packet Take 17 g by mouth daily as needed for mild constipation. 08/01/21   Terrilee Croak, MD  ?traMADol (ULTRAM) 50 MG tablet Take 50 mg by mouth every 6 (six) hours as needed for pain. 07/15/21   [provider]   ?venlafaxine XR (EFFEXOR-XR) 37.5 MG 24 hr capsule 37.5 mg daily with breakfast. 02/24/21   [provider]  ?  ? ?Family History  ?Problem Relation Age of Onset  ? Heart disease Father   ? Arthritis Sister   ? Heart disease Sister   ? Colon cancer Neg Hx   ? ? ?Social History  ? ?Socioeconomic History  ? Marital status: Married  ?  Spouse name: Not on file  ? Number of children: Not on file  ? Years of education: Not on file  ? Highest education level: 6th grade  ?Occupational History  ? Not on file  ?Tobacco Use  ? Smoking status: Never  ? Smokeless tobacco: Never  ?Vaping Use  ? Vaping Use: Never used  ?Substance and Sexual Activity  ? Alcohol use: Never  ? Drug use: Never  ? Sexual activity: Not Currently  ?  Birth control/protection: Post-menopausal  ?Other Topics Concern  ? Not on file  ?Social History Narrative  ? Not on file  ? ?Social Determinants of Health  ? ?Financial Resource Strain: Not on file  ?Food Insecurity: Not on file  ?Transportation Needs: Not on file  ?Physical Activity: Not on file  ?Stress: Not on file  ?Social Connections: Not on file  ? ? ?Review of Systems: A 12 point ROS discussed and pertinent positives are indicated in the HPI above.  All other systems are negative. ? ?Review of Systems  ?Constitutional:  Negative for appetite change and fatigue.  ?Respiratory:  Negative for cough and shortness of breath.   ?Cardiovascular:  Negative for chest pain and leg swelling.  ?Gastrointestinal:  Negative for abdominal pain, diarrhea, nausea and vomiting.  ?Neurological:  Negative for dizziness and headaches.  ? ?Vital Signs: ?BP (!) 86/48 (BP Location: Left Arm)   Pulse 68   Temp 98.2 ?F (36.8 ?C) (Oral)   Resp 18   LMP  (LMP Unknown)   SpO2 99%  ? ?Physical Exam ?Constitutional:   ?   General: She is not in acute distress. ?   Appearance: She is not ill-appearing.  ?HENT:  ?   Mouth/Throat:  ?   Mouth: Mucous membranes are moist.  ?   Pharynx: Oropharynx is clear.   ?Cardiovascular:  ?   Rate and Rhythm: Normal rate and regular rhythm.  ?   Pulses: Normal pulses.  ?   Heart sounds: Normal heart sounds.  ?Pulmonary:  ?   Effort: Pulmonary effort is normal.  ?   Breath sounds: Normal breath sounds.  ?Abdominal:  ?   General: Bowel sounds are normal.  ?   Palpations: Abdomen is soft.  ?Musculoskeletal:  ?   Right lower leg: No edema.  ?   Left lower leg: No edema.  ?Skin: ?   General: Skin is warm and dry.  ?Neurological:  ?   Mental Status: She is alert and oriented to person, place, and time.  ? ? ?Imaging: ?NM PET Image Restag (PS) Skull Base To Thigh ? ?Result Date: 08/26/2021 ?CLINICAL DATA:  Subsequent treatment strategy for diffuse large B-cell lymphoma status post chemo immunotherapy. Recent pathologic T12 vertebral fracture  with enhancing abnormal soft tissue in the T11-L1 spinal canal on MRI suspicious for recurrent lymphoma. T12 and L1 kyphoplasty 07/31/2021. EXAM: NUCLEAR MEDICINE PET SKULL BASE TO THIGH TECHNIQUE: 7.4 mCi F-18 FDG was injected intravenously. Full-ring PET imaging was performed from the skull base to thigh after the radiotracer. CT data was obtained and used for attenuation correction and anatomic localization. Fasting blood glucose: 104 mg/dl COMPARISON:  07/26/2021 CT chest, abdomen and pelvis. 07/22/2021 lumbar spine MRI. 05/23/2019 PET-CT. FINDINGS: Mediastinal blood pool activity: SUV max 2.6 Liver activity: SUV max 3.1 NECK: No hypermetabolic lymph nodes in the neck. Incidental CT findings: none CHEST: No enlarged or hypermetabolic axillary, mediastinal or hilar lymph nodes. No hypermetabolic pulmonary findings. Incidental CT findings: Coronary atherosclerosis. Atherosclerotic nonaneurysmal thoracic aorta. ABDOMEN/PELVIS: No abnormal hypermetabolic activity within the liver, pancreas, adrenal glands, or spleen. No hypermetabolic lymph nodes in the abdomen or pelvis. Incidental CT findings: Diffuse hepatic steatosis. Atherosclerotic nonaneurysmal  abdominal aorta. SKELETON: Infiltrative intensely hypermetabolic soft tissue involving the T12 and L1 vertebral bodies, bilateral posterior elements at T12, left L1 posterior elements, T12-L1 spinal canal and T12-L1 left posteri

## 2021-09-11 ENCOUNTER — Ambulatory Visit (HOSPITAL_COMMUNITY)
Admission: RE | Admit: 2021-09-11 | Discharge: 2021-09-11 | Disposition: A | Discharge status: Home / Self Care | Payer: BC Managed Care – PPO | Source: Ambulatory Visit | Attending: Radiation Oncology | Admitting: Radiation Oncology

## 2021-09-11 ENCOUNTER — Other Ambulatory Visit: Payer: Self-pay

## 2021-09-11 ENCOUNTER — Ambulatory Visit: Payer: BC Managed Care – PPO

## 2021-09-11 ENCOUNTER — Encounter (HOSPITAL_COMMUNITY): Payer: Self-pay

## 2021-09-11 ENCOUNTER — Other Ambulatory Visit: Payer: Self-pay | Admitting: Radiation Oncology

## 2021-09-11 DIAGNOSIS — K5090 Crohn's disease, unspecified, without complications: Secondary | ICD-10-CM | POA: Diagnosis not present

## 2021-09-11 DIAGNOSIS — C8339 Diffuse large B-cell lymphoma, extranodal and solid organ sites: Secondary | ICD-10-CM | POA: Insufficient documentation

## 2021-09-11 DIAGNOSIS — I10 Essential (primary) hypertension: Secondary | ICD-10-CM | POA: Insufficient documentation

## 2021-09-11 DIAGNOSIS — C8330 Diffuse large B-cell lymphoma, unspecified site: Secondary | ICD-10-CM

## 2021-09-11 DIAGNOSIS — C7951 Secondary malignant neoplasm of bone: Secondary | ICD-10-CM | POA: Insufficient documentation

## 2021-09-11 LAB — CBC WITH DIFFERENTIAL/PLATELET
Abs Immature Granulocytes: 0.02 10*3/uL (ref 0.00–0.07)
Basophils Absolute: 0 10*3/uL (ref 0.0–0.1)
Basophils Relative: 0 %
Eosinophils Absolute: 0.1 10*3/uL (ref 0.0–0.5)
Eosinophils Relative: 2 %
HCT: 39 % (ref 36.0–46.0)
Hemoglobin: 12.7 g/dL (ref 12.0–15.0)
Immature Granulocytes: 0 %
Lymphocytes Relative: 31 %
Lymphs Abs: 1.7 10*3/uL (ref 0.7–4.0)
MCH: 29.8 pg (ref 26.0–34.0)
MCHC: 32.6 g/dL (ref 30.0–36.0)
MCV: 91.5 fL (ref 80.0–100.0)
Monocytes Absolute: 0.4 10*3/uL (ref 0.1–1.0)
Monocytes Relative: 6 %
Neutro Abs: 3.3 10*3/uL (ref 1.7–7.7)
Neutrophils Relative %: 61 %
Platelets: 246 10*3/uL (ref 150–400)
RBC: 4.26 MIL/uL (ref 3.87–5.11)
RDW: 13.9 % (ref 11.5–15.5)
WBC: 5.5 10*3/uL (ref 4.0–10.5)
nRBC: 0 % (ref 0.0–0.2)

## 2021-09-11 LAB — GLUCOSE, CAPILLARY: Glucose-Capillary: 98 mg/dL (ref 70–99)

## 2021-09-11 MED ORDER — MIDAZOLAM HCL 2 MG/2ML IJ SOLN
INTRAMUSCULAR | Status: AC
  Filled 2021-09-11: qty 4

## 2021-09-11 MED ORDER — MIDAZOLAM HCL 2 MG/2ML IJ SOLN
INTRAMUSCULAR | Status: AC | PRN
  Administered 2021-09-11 (×3): 1 mg via INTRAVENOUS

## 2021-09-11 MED ORDER — FENTANYL CITRATE (PF) 100 MCG/2ML IJ SOLN
INTRAMUSCULAR | Status: AC
  Filled 2021-09-11: qty 2

## 2021-09-11 MED ORDER — SODIUM CHLORIDE 0.9 % IV BOLUS
500.0000 mL | Freq: Once | INTRAVENOUS | Status: AC
  Administered 2021-09-11: 500 mL via INTRAVENOUS

## 2021-09-11 MED ORDER — FENTANYL CITRATE (PF) 100 MCG/2ML IJ SOLN
INTRAMUSCULAR | Status: AC | PRN
Start: 2021-09-11 — End: 2021-09-11
  Administered 2021-09-11 (×2): 50 ug via INTRAVENOUS

## 2021-09-11 MED ORDER — SODIUM CHLORIDE 0.9 % IV SOLN: INTRAVENOUS | Status: DC

## 2021-09-11 NOTE — Discharge Instructions (Addendum)
Please call Interventional Radiology clinic [phone] with any questions or concerns. ? ?You may remove your dressing and shower tomorrow. ? ? ? ?Bone Biopsy and Bone Marrow Biopsy, Adult, Care After ?This sheet gives you information about how to care for yourself after your procedure. Your health care provider may also give you more specific instructions. If you have problems or questions, contact your health care provider. ?What can I expect after the procedure? ?After the procedure, it is common to have: ?Mild pain and tenderness. ?Swelling. ?Bruising. ?Follow these instructions at home: ?Puncture site care ?Follow instructions from your health care provider about how to take care of the puncture site. Make sure you: ?Wash your hands with soap and water before and after you change your bandage (dressing). If soap and water are not available, use hand sanitizer. ?Change your dressing as told by your health care provider. ?Check your puncture site every day for signs of infection. Check for: ?More redness, swelling, or pain. ?Fluid or blood. ?Warmth. ?Pus or a bad smell.   ?Activity ?Return to your normal activities as told by your health care provider. Ask your health care provider what activities are safe for you. ?Do not lift anything that is heavier than 10 lb (4.5 kg), or the limit that you are told, until your health care provider says that it is safe. ?Do not drive for 24 hours if you were given a sedative during your procedure. ?General instructions ?Take over-the-counter and prescription medicines only as told by your health care provider. ?Do not take baths, swim, or use a hot tub until your health care provider approves. Ask your health care provider if you may take showers. You may only be allowed to take sponge baths. ?If directed, put ice on the affected area. To do this: ?Put ice in a plastic bag. ?Place a towel between your skin and the bag. ?Leave the ice on for 20 minutes, 2-3 times a  day. ?Keep all follow-up visits as told by your health care provider. This is important.   ?Contact a health care provider if: ?Your pain is not controlled with medicine. ?You have a fever. ?You have more redness, swelling, or pain around the puncture site. ?You have fluid or blood coming from the puncture site. ?Your puncture site feels warm to the touch. ?You have pus or a bad smell coming from the puncture site. ?Summary ?After the procedure, it is common to have mild pain, tenderness, swelling, and bruising. ?Follow instructions from your health care provider about how to take care of the puncture site and what activities are safe for you. ?Take over-the-counter and prescription medicines only as told by your health care provider. ?Contact a health care provider if you have any signs of infection, such as fluid or blood coming from the puncture site. ?This information is not intended to replace advice given to you by your health care provider. Make sure you discuss any questions you have with your health care provider. ?Document Revised: 09/21/2018 Document Reviewed: 09/21/2018 ?Elsevier Patient Education ? [place]. ? ? ?Moderate Conscious Sedation, Adult, Care After ?This sheet gives you information about how to care for yourself after your procedure. Your health care provider may also give you more specific instructions. If you have problems or questions, contact your health care provider. ?What can I expect after the procedure? ?After the procedure, it is common to have: ?Sleepiness for several hours. ?Impaired judgment for several hours. ?Difficulty with balance. ?Vomiting if you eat too  soon. Follow these instructions at home: For the time period you were told by your health care provider: Rest. Do not participate in activities where you could fall or become injured. Do not drive or use machinery. Do not drink alcohol. Do not take sleeping pills or medicines that cause drowsiness. Do not  make important decisions or sign legal documents. Do not take care of children on your own.      Eating and drinking Follow the diet recommended by your health care provider. Drink enough fluid to keep your urine pale yellow. If you vomit: Drink water, juice, or soup when you can drink without vomiting. Make sure you have little or no nausea before eating solid foods.   General instructions Take over-the-counter and prescription medicines only as told by your health care provider. Have a responsible adult stay with you for the time you are told. It is important to have someone help care for you until you are awake and alert. Do not smoke. Keep all follow-up visits as told by your health care provider. This is important. Contact a health care provider if: You are still sleepy or having trouble with balance after 24 hours. You feel light-headed. You keep feeling nauseous or you keep vomiting. You develop a rash. You have a fever. You have redness or swelling around the IV site. Get help right away if: You have trouble breathing. You have new-onset confusion at home. Summary After the procedure, it is common to feel sleepy, have impaired judgment, or feel nauseous if you eat too soon. Rest after you get home. Know the things you should not do after the procedure. Follow the diet recommended by your health care provider and drink enough fluid to keep your urine pale yellow. Get help right away if you have trouble breathing or new-onset confusion at home. This information is not intended to replace advice given to you by your health care provider. Make sure you discuss any questions you have with your health care provider. Document Revised: 09/02/2019 Document Reviewed: 03/31/2019 Elsevier Patient Education  2021 Elsevier Inc.  

## 2021-09-11 NOTE — Procedures (Signed)
Pre-procedure Diagnosis: Lymphoma ?Post-procedure Diagnosis: Same ? ?Technically successful CT guided bone marrow aspiration and biopsy of left iliac crest.  ?Technically successful CT and US guided biopsy of T11-T12 left paraspinal hypermetabolic soft tissue mass.  ? ? ?Complications: None Immediate ? ?EBL: None ? ?Signed: ?Sandi Mariscal ?Pager: [phone] ?09/11/2021, 10:11 AM ? ? ?

## 2021-09-12 ENCOUNTER — Ambulatory Visit: Payer: BC Managed Care – PPO

## 2021-09-12 ENCOUNTER — Other Ambulatory Visit: Payer: Self-pay

## 2021-09-12 DIAGNOSIS — C8330 Diffuse large B-cell lymphoma, unspecified site: Secondary | ICD-10-CM

## 2021-09-12 LAB — SURGICAL PATHOLOGY

## 2021-09-13 ENCOUNTER — Ambulatory Visit: Payer: BC Managed Care – PPO

## 2021-09-13 ENCOUNTER — Inpatient Hospital Stay: Payer: BC Managed Care – PPO

## 2021-09-13 ENCOUNTER — Inpatient Hospital Stay: Payer: BC Managed Care – PPO | Admitting: Hematology

## 2021-09-13 ENCOUNTER — Other Ambulatory Visit: Payer: Self-pay

## 2021-09-13 VITALS — BP 146/91 | HR 85 | Temp 97.7°F | Resp 20 | Wt 154.1 lb

## 2021-09-13 DIAGNOSIS — C8330 Diffuse large B-cell lymphoma, unspecified site: Secondary | ICD-10-CM

## 2021-09-13 DIAGNOSIS — C8339 Diffuse large B-cell lymphoma, extranodal and solid organ sites: Secondary | ICD-10-CM | POA: Diagnosis not present

## 2021-09-13 LAB — CMP (CANCER CENTER ONLY)
ALT: 18 U/L (ref 0–44)
AST: 16 U/L (ref 15–41)
Albumin: 4 g/dL (ref 3.5–5.0)
Alkaline Phosphatase: 84 U/L (ref 38–126)
Anion gap: 5 (ref 5–15)
BUN: 18 mg/dL (ref 8–23)
CO2: 28 mmol/L (ref 22–32)
Calcium: 8.8 mg/dL — ABNORMAL LOW (ref 8.9–10.3)
Chloride: 103 mmol/L (ref 98–111)
Creatinine: 0.74 mg/dL (ref 0.44–1.00)
GFR, Estimated: >60 mL/min (ref >60)
Glucose, Bld: 89 mg/dL (ref 70–99)
Potassium: 3.4 mmol/L — ABNORMAL LOW (ref 3.5–5.1)
Sodium: 136 mmol/L (ref 135–145)
Total Bilirubin: 0.4 mg/dL (ref 0.3–1.2)
Total Protein: 6.9 g/dL (ref 6.5–8.1)

## 2021-09-13 LAB — CBC WITH DIFFERENTIAL (CANCER CENTER ONLY)
Abs Immature Granulocytes: 0.03 10*3/uL (ref 0.00–0.07)
Basophils Absolute: 0 10*3/uL (ref 0.0–0.1)
Basophils Relative: 1 %
Eosinophils Absolute: 0.1 10*3/uL (ref 0.0–0.5)
Eosinophils Relative: 1 %
HCT: 39.4 % (ref 36.0–46.0)
Hemoglobin: 13.1 g/dL (ref 12.0–15.0)
Immature Granulocytes: 1 %
Lymphocytes Relative: 34 %
Lymphs Abs: 2 10*3/uL (ref 0.7–4.0)
MCH: 30 pg (ref 26.0–34.0)
MCHC: 33.2 g/dL (ref 30.0–36.0)
MCV: 90.2 fL (ref 80.0–100.0)
Monocytes Absolute: 0.4 10*3/uL (ref 0.1–1.0)
Monocytes Relative: 7 %
Neutro Abs: 3.3 10*3/uL (ref 1.7–7.7)
Neutrophils Relative %: 56 %
Platelet Count: 242 10*3/uL (ref 150–400)
RBC: 4.37 MIL/uL (ref 3.87–5.11)
RDW: 13.8 % (ref 11.5–15.5)
WBC Count: 5.8 10*3/uL (ref 4.0–10.5)
nRBC: 0 % (ref 0.0–0.2)

## 2021-09-13 LAB — LACTATE DEHYDROGENASE: LDH: 213 U/L — ABNORMAL HIGH (ref 98–192)

## 2021-09-13 LAB — SURGICAL PATHOLOGY

## 2021-09-16 ENCOUNTER — Ambulatory Visit: Payer: BC Managed Care – PPO | Admitting: Radiation Oncology

## 2021-09-16 ENCOUNTER — Inpatient Hospital Stay: Payer: BC Managed Care – PPO | Attending: Hematology

## 2021-09-16 ENCOUNTER — Ambulatory Visit: Payer: BC Managed Care – PPO

## 2021-09-16 ENCOUNTER — Ambulatory Visit
Admission: RE | Admit: 2021-09-16 | Discharge: 2021-09-16 | Disposition: A | Discharge status: Home / Self Care | Payer: BC Managed Care – PPO | Source: Ambulatory Visit | Attending: Radiation Oncology | Admitting: Radiation Oncology

## 2021-09-16 ENCOUNTER — Other Ambulatory Visit: Payer: Self-pay

## 2021-09-16 DIAGNOSIS — C8339 Diffuse large B-cell lymphoma, extranodal and solid organ sites: Secondary | ICD-10-CM | POA: Insufficient documentation

## 2021-09-16 LAB — RAD ONC ARIA SESSION SUMMARY
Course Elapsed Days: 0
Plan Fractions Treated to Date: 1
Plan Prescribed Dose Per Fraction: 2.5 Gy
Plan Total Fractions Prescribed: 10
Plan Total Prescribed Dose: 25 Gy
Reference Point Dosage Given to Date: 2.5 Gy
Reference Point Session Dosage Given: 2.5 Gy
Session Number: 1

## 2021-09-17 ENCOUNTER — Ambulatory Visit: Payer: BC Managed Care – PPO

## 2021-09-17 ENCOUNTER — Other Ambulatory Visit: Payer: Self-pay

## 2021-09-17 ENCOUNTER — Ambulatory Visit
Admission: RE | Admit: 2021-09-17 | Discharge: 2021-09-17 | Disposition: A | Discharge status: Home / Self Care | Payer: BC Managed Care – PPO | Source: Ambulatory Visit | Attending: Radiation Oncology | Admitting: Radiation Oncology

## 2021-09-17 DIAGNOSIS — C8339 Diffuse large B-cell lymphoma, extranodal and solid organ sites: Secondary | ICD-10-CM | POA: Diagnosis not present

## 2021-09-17 LAB — RAD ONC ARIA SESSION SUMMARY
Course Elapsed Days: 1
Plan Fractions Treated to Date: 2
Plan Prescribed Dose Per Fraction: 2.5 Gy
Plan Total Fractions Prescribed: 10
Plan Total Prescribed Dose: 25 Gy
Reference Point Dosage Given to Date: 5 Gy
Reference Point Session Dosage Given: 2.5 Gy
Session Number: 2

## 2021-09-17 LAB — SURGICAL PATHOLOGY

## 2021-09-18 ENCOUNTER — Ambulatory Visit: Payer: BC Managed Care – PPO

## 2021-09-18 ENCOUNTER — Other Ambulatory Visit: Payer: Self-pay

## 2021-09-18 ENCOUNTER — Ambulatory Visit
Admission: RE | Admit: 2021-09-18 | Discharge: 2021-09-18 | Disposition: A | Discharge status: Home / Self Care | Payer: BC Managed Care – PPO | Source: Ambulatory Visit | Attending: Radiation Oncology | Admitting: Radiation Oncology

## 2021-09-18 DIAGNOSIS — C8339 Diffuse large B-cell lymphoma, extranodal and solid organ sites: Secondary | ICD-10-CM | POA: Diagnosis not present

## 2021-09-18 LAB — RAD ONC ARIA SESSION SUMMARY
Course Elapsed Days: 2
Plan Fractions Treated to Date: 3
Plan Prescribed Dose Per Fraction: 2.5 Gy
Plan Total Fractions Prescribed: 10
Plan Total Prescribed Dose: 25 Gy
Reference Point Dosage Given to Date: 7.5 Gy
Reference Point Session Dosage Given: 2.5 Gy
Session Number: 3

## 2021-09-19 ENCOUNTER — Encounter (HOSPITAL_COMMUNITY): Payer: Self-pay | Admitting: Hematology

## 2021-09-19 ENCOUNTER — Encounter: Payer: Self-pay | Admitting: Hematology

## 2021-09-19 ENCOUNTER — Ambulatory Visit: Admission: RE | Admit: 2021-09-19 | Payer: BC Managed Care – PPO | Source: Ambulatory Visit

## 2021-09-19 ENCOUNTER — Ambulatory Visit
Admission: RE | Admit: 2021-09-19 | Discharge: 2021-09-19 | Disposition: A | Discharge status: Home / Self Care | Payer: BC Managed Care – PPO | Source: Ambulatory Visit | Attending: Radiation Oncology | Admitting: Radiation Oncology

## 2021-09-19 ENCOUNTER — Ambulatory Visit: Payer: BC Managed Care – PPO

## 2021-09-19 ENCOUNTER — Other Ambulatory Visit: Payer: Self-pay

## 2021-09-19 DIAGNOSIS — C8339 Diffuse large B-cell lymphoma, extranodal and solid organ sites: Secondary | ICD-10-CM | POA: Diagnosis not present

## 2021-09-19 LAB — RAD ONC ARIA SESSION SUMMARY
Course Elapsed Days: 3
Plan Fractions Treated to Date: 4
Plan Prescribed Dose Per Fraction: 2.5 Gy
Plan Total Fractions Prescribed: 10
Plan Total Prescribed Dose: 25 Gy
Reference Point Dosage Given to Date: 10 Gy
Reference Point Session Dosage Given: 2.5 Gy
Session Number: 4

## 2021-09-19 NOTE — Progress Notes (Signed)
? ? ?HEMATOLOGY/ONCOLOGY CLINIC NOTE ? ?Date of Service: 09/13/2021 ? ? ?Patient Care Team: ?[name], MD as PCP - General (Family Medicine) ? ?CHIEF COMPLAINTS/PURPOSE OF CONSULTATION:  ?Follow-up for continued evaluation and management of  large B-cell lymphoma ? ?HISTORY OF PRESENTING ILLNESS:  ?Please see previous notes for details on initial presentation ? ?INTERVAL HISTORY:  ? ?Denise Walls is a 61 y.o. female is here for continued evaluation and management of relapsed large B-cell lymphoma. ?Since her last clinic visit patient was seen by radiation oncology and had a repeat biopsy of her T12-L1 soft tissue lesion.  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 ?High risk lymphoma FISH panel pending. ?The patient has been referred to [facility] for recommendations regarding treatment for her relapsed large B-cell lymphoma. ?Patient has localized recurrence in the spine and we discussed that this could cause spinal issues though she does not have overt cord compression symptoms at this time.  1 option would be to pursue radiation to the area of localized recurrence and then directly proceed to consideration of consolidative beam auto HSCT. ?Alternatively if the transplant team at [facility] prefers to demonstrate chemosensitivity we might have to go with a second line treatment option like R-GCD for about 2 cycles prior to proceeding with consideration of Beam autoHSCT. ? ?Patient notes no other acute new symptoms.  No significant new back pain.  No new lower extremity focal neurological deficits. ?Labs done today were reviewed with the patient. ? ? ?MEDICAL HISTORY:  ?Past Medical History:  ?Diagnosis Date  ? Acute cystitis   ? Anxiety   ? Arthritis   ? Cancer [facility])   ? Crohn disease ([place])   ? dx 2004, history of small bowel obstruction 02/2007 treated conservatively  ? History of colon polyps   ? History of  shingles   ? HTN (hypertension)   ? Hypercholesterolemia   ? Insomnia   ? Migraine   ? Uterine fibroid   ? ? ?SURGICAL HISTORY: ?Past Surgical History:  ?Procedure Laterality Date  ? COLONOSCOPY  09/11/2014  ? Small internal hemorrhoids. Otherwise normal colonoscopy to terminal ileum  ? COLONOSCOPY  10/02/2017  ? ESOPHAGOGASTRODUODENOSCOPY  04/05/2007  ? Mild gastritis. Otherwise, normal esophagogastroduodenoscopy  ? IR BONE TUMOR(S)RF ABLATION  07/31/2021  ? IR BONE TUMOR(S)RF ABLATION  07/31/2021  ? IR CT SPINE LTD  07/31/2021  ? IR IMAGING GUIDED PORT INSERTION  11/29/2018  ? IR KYPHO EA ADDL LEVEL THORACIC OR LUMBAR  07/31/2021  ? IR KYPHO THORACIC WITH BONE BIOPSY  07/31/2021  ? IR REMOVAL TUN ACCESS W/ PORT W/O FL MOD SED  02/25/2021  ? LYMPH NODE BIOPSY Left 12/09/2018  ? Procedure: LEFT DEEP CERVICAL LYMPH NODE BIOPSY;  Surgeon: Fanny Skates, MD;  [facility];  Service: General;  Laterality: Left;  ? TUBAL LIGATION    ? ? ?SOCIAL HISTORY: ?Social History  ? ?Socioeconomic History  ? Marital status: Married  ?  Spouse name: Not on file  ? Number of children: Not on file  ? Years of education: Not on file  ? Highest education level: 6th grade  ?Occupational History  ? Not on file  ?Tobacco Use  ? Smoking status: Never  ? Smokeless tobacco: Never  ?Vaping Use  ? Vaping Use: Never used  ?Substance and Sexual Activity  ? Alcohol use: Never  ? Drug use: Never  ? Sexual activity: Not Currently  ?  Birth control/protection: Post-menopausal  ?Other Topics Concern  ? Not on file  ?Social History Narrative  ? Not on file  ? ?Social Determinants of Health  ? ?Financial Resource Strain: Not on file  ?Food Insecurity: Not on file  ?Transportation Needs: Not on file  ?Physical Activity: Not on file  ?Stress: Not on file  ?Social Connections: Not on file  ?Intimate Partner Violence: Not on file  ? ? ?FAMILY HISTORY: ?Family History  ?Problem Relation Age of Onset  ? Heart disease Father   ? Arthritis Sister   ? Heart  disease Sister   ? Colon cancer Neg Hx   ? ? ?ALLERGIES:  is allergic to nsaids. ? ?MEDICATIONS:  ?Current Outpatient Medications  ?Medication Sig Dispense Refill  ? acetaminophen (TYLENOL) 325 MG tablet Take 325-650 mg by mouth every 6 (six) hours as needed for mild pain.    ? amLODipine (NORVASC) 10 MG tablet Take 1 tablet by mouth daily.    ? cholecalciferol (VITAMIN D3) 25 MCG (1000 UT) tablet Take 1 tablet (1,000 Units total) by mouth daily. 30 tablet 3  ? dexamethasone (DECADRON) 2 MG tablet Take 2 tablets (4 mg total) by mouth 2 (two) times daily. 20 tablet 0  ? mesalamine (LIALDA) 1.2 g EC tablet Take 2 tablets (2.4 g total) by mouth daily. 180 tablet 4  ? polyethylene glycol (MIRALAX / GLYCOLAX) 17 g packet Take 17 g by mouth daily as needed for mild constipation. 14 each 0  ? traMADol (ULTRAM) 50 MG tablet Take 50 mg by mouth every 6 (six) hours as needed for pain.    ? venlafaxine XR (EFFEXOR-XR) 37.5 MG 24 hr capsule 37.5 mg daily with breakfast.    ? ?No current facility-administered medications for this visit.  ? ? ?REVIEW OF SYSTEMS:   ?10 Point review of Systems was done is negative except as noted above. ?No fevers no chills no night sweats no loss significant new back pain at this time.  No new neurological symptoms. ? ? ?PHYSICAL EXAMINATION: ?ECOG PERFORMANCE STATUS: 2 - Symptomatic, <50% confined to bed ? ?. ?Vitals:  ? 09/13/21 1247  ?BP: (!) 146/91  ?Pulse: 85  ?Resp: 20  ?Temp: 97.7 ?F (36.5 ?C)  ?SpO2: 99%  ? ?Filed Weights  ? 09/13/21 1247  ?Weight: 154 lb 1.6 oz (69.9 kg)  ? ?.Body mass index is 30.1 kg/m?Marland Kitchen  ?NAD ?GENERAL:alert, in no acute distress and comfortable ?SKIN: no acute rashes, no significant lesions ?EYES: conjunctiva are pink and non-injected, sclera anicteric ?OROPHARYNX: MMM, no exudates, no oropharyngeal erythema or ulceration ?NECK: supple, no JVD ?LYMPH:  no palpable lymphadenopathy in the cervical, axillary or inguinal regions ?LUNGS: clear to auscultation b/l with  normal respiratory effort ?HEART: regular rate & rhythm ?ABDOMEN:  normoactive bowel sounds , non tender, not distended. ?Extremity: no pedal edema ?PSYCH: alert & oriented x 3 with fluent speech ?NEURO: no focal motor/sensory deficits ? ? ?LABORATORY DATA:  ?I have reviewed the data as listed ? ?. ? ?  Latest Ref Rng & Units 09/13/2021  ? 11:59 AM 09/11/2021  ?  7:43 AM 07/31/2021  ?  8:53 AM  ?CBC  ?WBC 4.0 - 10.5 K/uL 5.8   5.5   7.9    ?Hemoglobin 12.0 - 15.0 g/dL 13.1   12.7   14.1    ?Hematocrit 36.0 - 46.0 % 39.4   39.0   41.0    ?Platelets 150 - 400 K/uL 242   246   265    ? ? ?. ? ?  Latest Ref Rng & Units 09/13/2021  ? 11:59 AM 07/31/2021  ?  8:53 AM 07/29/2021  ?  4:28 AM  ?CMP  ?Glucose 70 - 99 mg/dL 89   132   143    ?BUN 8 - 23 mg/dL 18   26   24     ?Creatinine 0.44 - 1.00 mg/dL 0.74   0.73   0.81    ?Sodium 135 - 145 mmol/L 136   137   138    ?Potassium 3.5 - 5.1 mmol/L 3.4   4.2   4.3    ?Chloride 98 - 111 mmol/L 103   105   108    ?CO2 22 - 32 mmol/L 28   22   23     ?Calcium 8.9 - 10.3 mg/dL 8.8   8.4   8.6    ?Total Protein 6.5 - 8.1 g/dL 6.9      ?Total Bilirubin 0.3 - 1.2 mg/dL 0.4      ?Alkaline Phos 38 - 126 U/L 84      ?AST 15 - 41 U/L 16      ?ALT 0 - 44 U/L 18      ? ?. ?Lab Results  ?Component Value Date  ? LDH 213 (H) 09/13/2021  ? ?SURGICAL PATHOLOGY  ?CASE: [id number]  ?PATIENT: Chelsea Bruneau  ?Surgical Pathology Report  ? ? ? ? ?Clinical History: History of lymphoma, post CT and US guided biopsy of  ?T11-T12 left paraspinal hypermetabolic soft tissue mass ([facility])  ? ? ?FINAL MICROSCOPIC DIAGNOSIS:  ? ?A. SOFT TISSUE MASS, T11-T12, LEFT PARASPINAL, NEEDLE CORE BIOPSY:  ?-  Diffuse large B-cell lymphoma, germinal center subtype  ?-  See comment  ? ?COMMENT:  ? ?The core biopsies shows a diffuse infiltrate of malignant appearing  ?cells with scant to ample eosinophilic cytoplasm and round to irregular  ?nuclei with prominent eosinophilic nucleoli.  The malignant cells are  ?infiltrating and  surrounding skeletal muscle.  By immunohistochemistry,  ?the neoplastic cells are positive for CD20, PAX5, CD10 (variable), BCL6  ?and Bcl-2 but negative for mum 1, CD5, CD30, and EBV by in situ  ?hybridization.  The p

## 2021-09-20 ENCOUNTER — Other Ambulatory Visit: Payer: Self-pay

## 2021-09-20 ENCOUNTER — Ambulatory Visit: Payer: BC Managed Care – PPO

## 2021-09-20 ENCOUNTER — Ambulatory Visit
Admission: RE | Admit: 2021-09-20 | Discharge: 2021-09-20 | Disposition: A | Discharge status: Home / Self Care | Payer: BC Managed Care – PPO | Source: Ambulatory Visit | Attending: Radiation Oncology | Admitting: Radiation Oncology

## 2021-09-20 ENCOUNTER — Encounter (HOSPITAL_COMMUNITY): Payer: Self-pay | Admitting: Hematology

## 2021-09-20 DIAGNOSIS — C8339 Diffuse large B-cell lymphoma, extranodal and solid organ sites: Secondary | ICD-10-CM | POA: Diagnosis not present

## 2021-09-20 LAB — RAD ONC ARIA SESSION SUMMARY
Course Elapsed Days: 4
Plan Fractions Treated to Date: 5
Plan Prescribed Dose Per Fraction: 2.5 Gy
Plan Total Fractions Prescribed: 10
Plan Total Prescribed Dose: 25 Gy
Reference Point Dosage Given to Date: 12.5 Gy
Reference Point Session Dosage Given: 2.5 Gy
Session Number: 5

## 2021-09-23 ENCOUNTER — Ambulatory Visit: Payer: BC Managed Care – PPO

## 2021-09-23 ENCOUNTER — Other Ambulatory Visit: Payer: Self-pay

## 2021-09-23 ENCOUNTER — Ambulatory Visit
Admission: RE | Admit: 2021-09-23 | Discharge: 2021-09-23 | Disposition: A | Discharge status: Home / Self Care | Payer: BC Managed Care – PPO | Source: Ambulatory Visit | Attending: Radiation Oncology | Admitting: Radiation Oncology

## 2021-09-23 DIAGNOSIS — C8339 Diffuse large B-cell lymphoma, extranodal and solid organ sites: Secondary | ICD-10-CM | POA: Diagnosis not present

## 2021-09-23 LAB — RAD ONC ARIA SESSION SUMMARY
Course Elapsed Days: 7
Plan Fractions Treated to Date: 6
Plan Prescribed Dose Per Fraction: 2.5 Gy
Plan Total Fractions Prescribed: 10
Plan Total Prescribed Dose: 25 Gy
Reference Point Dosage Given to Date: 15 Gy
Reference Point Session Dosage Given: 2.5 Gy
Session Number: 6

## 2021-09-23 LAB — SURGICAL PATHOLOGY

## 2021-09-24 ENCOUNTER — Ambulatory Visit: Payer: BC Managed Care – PPO

## 2021-09-24 ENCOUNTER — Ambulatory Visit
Admission: RE | Admit: 2021-09-24 | Discharge: 2021-09-24 | Disposition: A | Discharge status: Home / Self Care | Payer: BC Managed Care – PPO | Source: Ambulatory Visit | Attending: Radiation Oncology | Admitting: Radiation Oncology

## 2021-09-24 ENCOUNTER — Other Ambulatory Visit: Payer: Self-pay

## 2021-09-24 DIAGNOSIS — C8339 Diffuse large B-cell lymphoma, extranodal and solid organ sites: Secondary | ICD-10-CM | POA: Diagnosis not present

## 2021-09-24 LAB — RAD ONC ARIA SESSION SUMMARY
Course Elapsed Days: 8
Plan Fractions Treated to Date: 7
Plan Prescribed Dose Per Fraction: 2.5 Gy
Plan Total Fractions Prescribed: 10
Plan Total Prescribed Dose: 25 Gy
Reference Point Dosage Given to Date: 17.5 Gy
Reference Point Session Dosage Given: 2.5 Gy
Session Number: 7

## 2021-09-25 ENCOUNTER — Ambulatory Visit: Payer: BC Managed Care – PPO

## 2021-09-25 ENCOUNTER — Other Ambulatory Visit: Payer: Self-pay

## 2021-09-25 ENCOUNTER — Ambulatory Visit
Admission: RE | Admit: 2021-09-25 | Discharge: 2021-09-25 | Disposition: A | Discharge status: Home / Self Care | Payer: BC Managed Care – PPO | Source: Ambulatory Visit | Attending: Radiation Oncology | Admitting: Radiation Oncology

## 2021-09-25 DIAGNOSIS — C8339 Diffuse large B-cell lymphoma, extranodal and solid organ sites: Secondary | ICD-10-CM | POA: Diagnosis not present

## 2021-09-25 LAB — RAD ONC ARIA SESSION SUMMARY
Course Elapsed Days: 9
Plan Fractions Treated to Date: 8
Plan Prescribed Dose Per Fraction: 2.5 Gy
Plan Total Fractions Prescribed: 10
Plan Total Prescribed Dose: 25 Gy
Reference Point Dosage Given to Date: 20 Gy
Reference Point Session Dosage Given: 2.5 Gy
Session Number: 8

## 2021-09-26 ENCOUNTER — Ambulatory Visit: Payer: BC Managed Care – PPO

## 2021-09-26 ENCOUNTER — Ambulatory Visit
Admission: RE | Admit: 2021-09-26 | Discharge: 2021-09-26 | Disposition: A | Discharge status: Home / Self Care | Payer: BC Managed Care – PPO | Source: Ambulatory Visit | Attending: Radiation Oncology | Admitting: Radiation Oncology

## 2021-09-26 ENCOUNTER — Other Ambulatory Visit: Payer: Self-pay

## 2021-09-26 DIAGNOSIS — C8339 Diffuse large B-cell lymphoma, extranodal and solid organ sites: Secondary | ICD-10-CM | POA: Diagnosis not present

## 2021-09-26 LAB — RAD ONC ARIA SESSION SUMMARY
Course Elapsed Days: 10
Plan Fractions Treated to Date: 9
Plan Prescribed Dose Per Fraction: 2.5 Gy
Plan Total Fractions Prescribed: 10
Plan Total Prescribed Dose: 25 Gy
Reference Point Dosage Given to Date: 22.5 Gy
Reference Point Session Dosage Given: 2.5 Gy
Session Number: 9

## 2021-09-27 ENCOUNTER — Other Ambulatory Visit: Payer: Self-pay

## 2021-09-27 ENCOUNTER — Ambulatory Visit: Payer: BC Managed Care – PPO

## 2021-09-27 ENCOUNTER — Encounter: Payer: Self-pay | Admitting: Radiation Oncology

## 2021-09-27 ENCOUNTER — Ambulatory Visit
Admission: RE | Admit: 2021-09-27 | Discharge: 2021-09-27 | Disposition: A | Discharge status: Home / Self Care | Payer: BC Managed Care – PPO | Source: Ambulatory Visit | Attending: Radiation Oncology | Admitting: Radiation Oncology

## 2021-09-27 DIAGNOSIS — C8339 Diffuse large B-cell lymphoma, extranodal and solid organ sites: Secondary | ICD-10-CM | POA: Diagnosis not present

## 2021-09-27 LAB — RAD ONC ARIA SESSION SUMMARY
Course Elapsed Days: 11
Plan Fractions Treated to Date: 10
Plan Prescribed Dose Per Fraction: 2.5 Gy
Plan Total Fractions Prescribed: 10
Plan Total Prescribed Dose: 25 Gy
Reference Point Dosage Given to Date: 25 Gy
Reference Point Session Dosage Given: 2.5 Gy
Session Number: 10

## 2021-10-07 ENCOUNTER — Encounter: Payer: Self-pay | Admitting: Hematology

## 2021-10-07 NOTE — Progress Notes (Addendum)
                                                                                                                                                             Patient Name: Denise Walls MRN: [id number] DOB: Oct 28, 1960 Referring Physician: [name] (Profile Not Attached) Date of Service: 09/27/2021 [facility], [place]                                                        End Of Treatment Note  Diagnoses: C79.51-Secondary malignant neoplasm of bone  DLBCL with recurrent disease in the paraspinal tissue  Intent: Palliative  Radiation Treatment Dates: 09/16/2021 through 09/27/2021 Site Technique Total Dose (Gy) Dose per Fx (Gy) Completed Fx Beam Energies  Thoracic Spine: Spine_T11-L2 3D 25/25 2.5 10/10 6X, 15X   Narrative: Treatment was started after tissue biopsy of paraspinal tissue confirmed lymphoma relapse. The patient tolerated radiation therapy relatively well.   Plan: The patient will follow-up with radiation oncology in 75mo  [facility] also [facility] her and order PET restaging scan.  -----------------------------------  SEppie Gibson MD

## 2021-10-21 ENCOUNTER — Other Ambulatory Visit: Payer: Self-pay | Admitting: Oncology

## 2021-10-23 ENCOUNTER — Other Ambulatory Visit: Payer: Self-pay | Admitting: Oncology

## 2021-11-06 ENCOUNTER — Ambulatory Visit
Admission: RE | Admit: 2021-11-06 | Discharge: 2021-11-06 | Disposition: A | Discharge status: Home / Self Care | Payer: BC Managed Care – PPO | Source: Ambulatory Visit | Attending: Radiation Oncology | Admitting: Radiation Oncology

## 2021-11-06 ENCOUNTER — Encounter: Payer: Self-pay | Admitting: Radiation Oncology

## 2021-11-06 VITALS — BP 125/84 | HR 78 | Temp 98.6°F | Resp 18 | Ht 60.0 in | Wt 157.5 lb

## 2021-11-06 DIAGNOSIS — C8338 Diffuse large B-cell lymphoma, lymph nodes of multiple sites: Secondary | ICD-10-CM | POA: Insufficient documentation

## 2021-11-06 DIAGNOSIS — Z923 Personal history of irradiation: Secondary | ICD-10-CM | POA: Insufficient documentation

## 2021-11-06 DIAGNOSIS — Z79899 Other long term (current) drug therapy: Secondary | ICD-10-CM | POA: Insufficient documentation

## 2021-11-06 DIAGNOSIS — C7951 Secondary malignant neoplasm of bone: Secondary | ICD-10-CM

## 2021-11-06 NOTE — Progress Notes (Signed)
Denise Walls presents today for follow-up after completing radiation to her T-spine on 09/27/2021  Overall reports she's doing well. States ~3 days ago she started noticing a tension sensation to her mid-back that radiates around to her naval, otherwise denies any back pain or concerns. Reports occasionally feeling mild dizziness when she first stands, but states it's not concerning. States currently her only concern is the swelling in her lower extremities towards the end of the day/evenings. Denies any nausea, changes in appetite, or changes in bowel habits. Last saw her medical oncologist Dr. Irene Limbo 08/2021.

## 2021-11-06 NOTE — Progress Notes (Signed)
Radiation Oncology         [phone] ________________________________  Name: Denise Walls MRN: [id number]  Date: 11/06/2021  DOB: [birth year]  Follow-Up Visit Note  Outpatient  CC: [name], MD  [name], MD  Diagnosis and Prior Radiotherapy:    ICD-10-CM   1. Metastatic cancer to spine ([place])  C79.51     2. Diffuse large B-cell lymphoma of lymph nodes of multiple regions ([facility])  C83.38      DLBCL with recurrent disease in the paraspinal tissue     Radiation Treatment Dates: 09/16/2021 through 09/27/2021 Site Technique Total Dose (Gy) Dose per Fx (Gy) Completed Fx Beam Energies  Thoracic Spine: Spine_T11-L2 3D 25/25 2.5 10/10 6X, 15X   CHIEF COMPLAINT: Here for follow-up and surveillance of lymphoma  Narrative:  The patient returns today for routine follow-up.  Denise Walls presents today for follow-up after completing radiation to her T-spine on 09/27/2021  Overall reports she's doing well. States ~3 days ago she started noticing a tension sensation to her mid-back that radiates around to her navel, otherwise denies any back pain or concerns. Reports occasionally feeling mild dizziness when she first stands, but states it's not concerning. States currently her only concern is the swelling in her lower extremities towards the end of the day/evenings. Denies any nausea, changes in appetite, or changes in bowel habits. Last saw her medical oncologist Dr. Irene Limbo 08/2021.            She sees med/onc, [facility], July 6th after PET restaging.                    She is no longer taking dexamethasone  ALLERGIES:  is allergic to nsaids.  Meds: Current Outpatient Medications  Medication Sig Dispense Refill   acetaminophen (TYLENOL) 325 MG tablet Take 325-650 mg by mouth every 6 (six) hours as needed for mild pain.     amLODipine (NORVASC) 10 MG tablet Take 1 tablet by mouth daily.     cholecalciferol (VITAMIN D3) 25 MCG (1000 UT) tablet Take 1 tablet (1,000 Units total)  by mouth daily. 30 tablet 3   dexamethasone (DECADRON) 2 MG tablet Take 2 tablets (4 mg total) by mouth 2 (two) times daily. 20 tablet 0   mesalamine (LIALDA) 1.2 g EC tablet Take 2 tablets (2.4 g total) by mouth daily. 180 tablet 4   polyethylene glycol (MIRALAX / GLYCOLAX) 17 g packet Take 17 g by mouth daily as needed for mild constipation. 14 each 0   traMADol (ULTRAM) 50 MG tablet Take 50 mg by mouth every 6 (six) hours as needed for pain.     venlafaxine XR (EFFEXOR-XR) 37.5 MG 24 hr capsule 37.5 mg daily with breakfast.     No current facility-administered medications for this encounter.    Physical Findings: The patient is in no acute distress. Patient is alert and oriented.  height is 5' (1.524 m) and weight is 157 lb 8 oz (71.4 kg). Her temporal temperature is 98.6 F (37 C). Her blood pressure is 125/84 and her pulse is 78. Her respiration is 18 and oxygen saturation is 100%. .    Gen: Alert and oriented and in no acute distress MSK: She is ambulatory and her strength is symmetric in all extremities Neuro: Nonfocal, sensation grossly intact Skin: No desquamation in radiation fields  Lab Findings: Lab Results  Component Value Date   WBC 5.8 09/13/2021   HGB 13.1 09/13/2021   HCT 39.4  09/13/2021   MCV 90.2 09/13/2021   PLT 242 09/13/2021    Radiographic Findings: No results found.  Impression/Plan: She is doing well after radiation therapy to the spine and paraspinal tissues.  She will follow-up as scheduled with medical oncology at [facility].  I will see her back on an as-needed basis.     On date of service, in total, I spent 15 minutes on this encounter. Patient was seen in person.  _____________________________________   Eppie Gibson, MD

## 2022-01-23 ENCOUNTER — Other Ambulatory Visit: Payer: Self-pay

## 2022-01-23 DIAGNOSIS — C8330 Diffuse large B-cell lymphoma, unspecified site: Secondary | ICD-10-CM

## 2022-03-10 ENCOUNTER — Inpatient Hospital Stay: Payer: BC Managed Care – PPO | Attending: Hematology

## 2022-03-10 ENCOUNTER — Other Ambulatory Visit: Payer: Self-pay

## 2022-03-10 ENCOUNTER — Inpatient Hospital Stay (HOSPITAL_BASED_OUTPATIENT_CLINIC_OR_DEPARTMENT_OTHER): Payer: BC Managed Care – PPO | Admitting: Hematology

## 2022-03-10 VITALS — BP 135/95 | HR 72 | Temp 97.8°F | Resp 18 | Ht 60.0 in | Wt 150.0 lb

## 2022-03-10 DIAGNOSIS — C8330 Diffuse large B-cell lymphoma, unspecified site: Secondary | ICD-10-CM | POA: Insufficient documentation

## 2022-03-10 DIAGNOSIS — Z9484 Stem cells transplant status: Secondary | ICD-10-CM | POA: Insufficient documentation

## 2022-03-10 LAB — CBC WITH DIFFERENTIAL (CANCER CENTER ONLY)
Abs Immature Granulocytes: 0.01 10*3/uL (ref 0.00–0.07)
Basophils Absolute: 0 10*3/uL (ref 0.0–0.1)
Basophils Relative: 1 %
Eosinophils Absolute: 0.1 10*3/uL (ref 0.0–0.5)
Eosinophils Relative: 2 %
HCT: 35 % — ABNORMAL LOW (ref 36.0–46.0)
Hemoglobin: 11.4 g/dL — ABNORMAL LOW (ref 12.0–15.0)
Immature Granulocytes: 0 %
Lymphocytes Relative: 45 %
Lymphs Abs: 2.2 10*3/uL (ref 0.7–4.0)
MCH: 30.3 pg (ref 26.0–34.0)
MCHC: 32.6 g/dL (ref 30.0–36.0)
MCV: 93.1 fL (ref 80.0–100.0)
Monocytes Absolute: 0.3 10*3/uL (ref 0.1–1.0)
Monocytes Relative: 6 %
Neutro Abs: 2.1 10*3/uL (ref 1.7–7.7)
Neutrophils Relative %: 46 %
Platelet Count: 144 10*3/uL — ABNORMAL LOW (ref 150–400)
RBC: 3.76 MIL/uL — ABNORMAL LOW (ref 3.87–5.11)
RDW: 15.9 % — ABNORMAL HIGH (ref 11.5–15.5)
WBC Count: 4.7 10*3/uL (ref 4.0–10.5)
nRBC: 0 % (ref 0.0–0.2)

## 2022-03-10 LAB — CMP (CANCER CENTER ONLY)
ALT: 33 U/L (ref 0–44)
AST: 35 U/L (ref 15–41)
Albumin: 4.2 g/dL (ref 3.5–5.0)
Alkaline Phosphatase: 72 U/L (ref 38–126)
Anion gap: 7 (ref 5–15)
BUN: 16 mg/dL (ref 8–23)
CO2: 26 mmol/L (ref 22–32)
Calcium: 9.3 mg/dL (ref 8.9–10.3)
Chloride: 107 mmol/L (ref 98–111)
Creatinine: 0.83 mg/dL (ref 0.44–1.00)
GFR, Estimated: >60 mL/min (ref >60)
Glucose, Bld: 92 mg/dL (ref 70–99)
Potassium: 4 mmol/L (ref 3.5–5.1)
Sodium: 140 mmol/L (ref 135–145)
Total Bilirubin: 0.3 mg/dL (ref 0.3–1.2)
Total Protein: 7 g/dL (ref 6.5–8.1)

## 2022-03-10 LAB — MAGNESIUM: Magnesium: 2 mg/dL (ref 1.7–2.4)

## 2022-03-10 NOTE — Progress Notes (Signed)
HEMATOLOGY/ONCOLOGY CLINIC NOTE  Date of Service: 03/10/22   Patient Care Team: [name], MD as PCP - General (Family Medicine)  CHIEF COMPLAINTS/PURPOSE OF CONSULTATION:  Follow-up for continued evaluation and management of  large B-cell lymphoma  HISTORY OF PRESENTING ILLNESS:  Please see previous notes for details on initial presentation  INTERVAL HISTORY:   Denise Walls is a 61 y.o. female is here for continued evaluation and management of relapsed large B-cell lymphoma.  Patient was last seen by me on 09/13/2021 and was doing well. She was referred to [facility] for recommendations regarding treatment for her relapsed large B-cell lymphoma.   She had her autologous stem cell transplant on 01/07/2022.  Patient is here with her husband during today's visit. Patient reports she is doing well today without any new symptoms. She is doing well overall post her stem cell transplant except mild fatigue and mild body aches.   She reports that her appetite has been slowly getting better. She complains that she is having mild lower back pain, states that it feels slightly tight.  She notes she can walk more now after the transplant. Her husband asked if her wife can come for walks with her, which she can.  She denies abdominal pain, acid reflux, diarrhea, constipation, leg swelling.    MEDICAL HISTORY:  Past Medical History:  Diagnosis Date   Acute cystitis    Anxiety    Arthritis    Cancer ([place])    Crohn disease ([place])    dx 2004, history of small bowel obstruction 02/2007 treated conservatively   History of colon polyps    History of shingles    HTN (hypertension)    Hypercholesterolemia    Insomnia    Migraine    Uterine fibroid     SURGICAL HISTORY: Past Surgical History:  Procedure Laterality Date   COLONOSCOPY  09/11/2014   Small internal hemorrhoids. Otherwise normal colonoscopy to terminal ileum   COLONOSCOPY  10/02/2017    ESOPHAGOGASTRODUODENOSCOPY  04/05/2007   Mild gastritis. Otherwise, normal esophagogastroduodenoscopy   IR BONE TUMOR(S)RF ABLATION  07/31/2021   IR BONE TUMOR(S)RF ABLATION  07/31/2021   IR CT SPINE LTD  07/31/2021   IR IMAGING GUIDED PORT INSERTION  11/29/2018   IR KYPHO EA ADDL LEVEL THORACIC OR LUMBAR  07/31/2021   IR KYPHO THORACIC WITH BONE BIOPSY  07/31/2021   IR REMOVAL TUN ACCESS W/ PORT W/O FL MOD SED  02/25/2021   LYMPH NODE BIOPSY Left 12/09/2018   Procedure: LEFT DEEP CERVICAL LYMPH NODE BIOPSY;  Surgeon: Fanny Skates, MD;  Location: [place];  Service: General;  Laterality: Left;   TUBAL LIGATION      SOCIAL HISTORY: Social History   Socioeconomic History   Marital status: Married    Spouse name: Not on file   Number of children: Not on file   Years of education: Not on file   Highest education level: 6th grade  Occupational History   Not on file  Tobacco Use   Smoking status: Never   Smokeless tobacco: Never  Vaping Use   Vaping Use: Never used  Substance and Sexual Activity   Alcohol use: Never   Drug use: Never   Sexual activity: Not Currently    Birth control/protection: Post-menopausal  Other Topics Concern   Not on file  Social History Narrative   Not on file   Social Determinants of Health   Financial Resource Strain: Not on file  Food Insecurity:  Not on file  Transportation Needs: No Transportation Needs (08/22/2020)   PRAPARE - Hydrologist (Medical): No    Lack of Transportation (Non-Medical): No  Physical Activity: Not on file  Stress: Not on file  Social Connections: Not on file  Intimate Partner Violence: Not on file    FAMILY HISTORY: Family History  Problem Relation Age of Onset   Heart disease Father    Arthritis Sister    Heart disease Sister    Colon cancer Neg Hx     ALLERGIES:  is allergic to nsaids.  MEDICATIONS:  Current Outpatient Medications  Medication Sig Dispense Refill   acetaminophen  (TYLENOL) 325 MG tablet Take 325-650 mg by mouth every 6 (six) hours as needed for mild pain.     amLODipine (NORVASC) 10 MG tablet Take 1 tablet by mouth daily.     cholecalciferol (VITAMIN D3) 25 MCG (1000 UT) tablet Take 1 tablet (1,000 Units total) by mouth daily. 30 tablet 3   dexamethasone (DECADRON) 2 MG tablet Take 2 tablets (4 mg total) by mouth 2 (two) times daily. 20 tablet 0   mesalamine (LIALDA) 1.2 g EC tablet Take 2 tablets (2.4 g total) by mouth daily. 180 tablet 4   polyethylene glycol (MIRALAX / GLYCOLAX) 17 g packet Take 17 g by mouth daily as needed for mild constipation. 14 each 0   traMADol (ULTRAM) 50 MG tablet Take 50 mg by mouth every 6 (six) hours as needed for pain.     venlafaxine XR (EFFEXOR-XR) 37.5 MG 24 hr capsule 37.5 mg daily with breakfast.     No current facility-administered medications for this visit.    REVIEW OF SYSTEMS:   10 Point review of Systems was done is negative except as noted above. No fevers no chills no night sweats no loss significant new back pain at this time.  No new neurological symptoms.   PHYSICAL EXAMINATION: ECOG PERFORMANCE STATUS: 2 - Symptomatic, <50% confined to bed  . Vitals:   03/10/22 0953  BP: (!) 135/95  Pulse: 72  Resp: 18  Temp: 97.8 F (36.6 C)  SpO2: 100%    Filed Weights   03/10/22 0953  Weight: 150 lb (68 kg)    .Body mass index is 29.29 kg/m.  NAD GENERAL:alert, in no acute distress and comfortable SKIN: no acute rashes, no significant lesions EYES: conjunctiva are pink and non-injected, sclera anicteric OROPHARYNX: MMM, no exudates, no oropharyngeal erythema or ulceration NECK: supple, no JVD LYMPH:  no palpable lymphadenopathy in the cervical, axillary or inguinal regions LUNGS: clear to auscultation b/l with normal respiratory effort HEART: regular rate & rhythm ABDOMEN:  normoactive bowel sounds , non tender, not distended. Extremity: no pedal edema PSYCH: alert & oriented x 3 with fluent  speech NEURO: no focal motor/sensory deficits   LABORATORY DATA:  I have reviewed the data as listed  .    Latest Ref Rng & Units 03/10/2022    9:31 AM 09/13/2021   11:59 AM 09/11/2021    7:43 AM  CBC  WBC 4.0 - 10.5 K/uL 4.7  5.8  5.5   Hemoglobin 12.0 - 15.0 g/dL 11.4  13.1  12.7   Hematocrit 36.0 - 46.0 % 35.0  39.4  39.0   Platelets 150 - 400 K/uL 144  242  246     .    Latest Ref Rng & Units 03/10/2022    9:31 AM 09/13/2021   11:59 AM 07/31/2021  8:53 AM  CMP  Glucose 70 - 99 mg/dL 92  89  132   BUN 8 - 23 mg/dL 16  18  26    Creatinine 0.44 - 1.00 mg/dL 0.83  0.74  0.73   Sodium 135 - 145 mmol/L 140  136  137   Potassium 3.5 - 5.1 mmol/L 4.0  3.4  4.2   Chloride 98 - 111 mmol/L 107  103  105   CO2 22 - 32 mmol/L 26  28  22    Calcium 8.9 - 10.3 mg/dL 9.3  8.8  8.4   Total Protein 6.5 - 8.1 g/dL 7.0  6.9    Total Bilirubin 0.3 - 1.2 mg/dL 0.3  0.4    Alkaline Phos 38 - 126 U/L 72  84    AST 15 - 41 U/L 35  16    ALT 0 - 44 U/L 33  18     . Lab Results  Component Value Date   LDH 213 (H) 09/13/2021   SURGICAL PATHOLOGY  CASE: [id number]  PATIENT: Michalina Mcnicholas  Surgical Pathology Report      Clinical History: History of lymphoma, post CT and US guided biopsy of  T11-T12 left paraspinal hypermetabolic soft tissue mass (jmc)    FINAL MICROSCOPIC DIAGNOSIS:   A. SOFT TISSUE MASS, T11-T12, LEFT PARASPINAL, NEEDLE CORE BIOPSY:  -  Diffuse large B-cell lymphoma, germinal center subtype  -  See comment   COMMENT:   The core biopsies shows a diffuse infiltrate of malignant appearing  cells with scant to ample eosinophilic cytoplasm and round to irregular  nuclei with prominent eosinophilic nucleoli.  The malignant cells are  infiltrating and surrounding skeletal muscle.  By immunohistochemistry,  the neoplastic cells are positive for CD20, PAX5, CD10 (variable), BCL6  and Bcl-2 but negative for mum 1, CD5, CD30, and EBV by in situ  hybridization.  The  proliferative rate by Ki-67 is increased (up to  40%).  CD3 highlights background T cells.  Flow cytometry was attempted  on the sample ([id number]) but there was insufficient material for cyst.    Overall, these findings are consistent with a diffuse large B-cell  lymphoma, germinal center subtype.  FISH studies (high-grade/large  B-cell panel) are pending and will be reported in an addendum.     RADIOGRAPHIC STUDIES: I have personally reviewed the radiological images as listed and agreed with the findings in the report. No results found.  ASSESSMENT & PLAN:   1.  History of stage IV DLBCL, GCB subtype; double hit lymphoma -S/p 1 cycle  of R-CHOP and 5 cycles of da EPOCH-R and IT MTX x 4 for CNS prophylaxis -FISH  on 01/03/2019 and were positive for Myc and BCL2 rearrangement  02/14/2019 PET Scan Whole Body ([id number]) revealed "1. Findings favor complete metabolic response. No residual hypermetabolism within the lymph nodes of the neck, chest, abdomen or pelvis, which have all decreased in size in the interval. 2. Nonspecific new diffuse skeletal hypermetabolism and new mild splenic hypermetabolism, favor reactive state of the marrow and reticuloendothelial system. Spleen is normal size. Continued surveillance with CT or PET-CT advised. 3. Small layering left pleural effusion, decreased. 4.  Aortic Atherosclerosis (ICD10-I70.0)."  -Completed 5 cycles of R-EPOCH on 04/04/2019, 6 total cycles of chemotherapy (1st cycle was R-CHOP) -Completed 4 cycles of IT METHOTREXATE on 04/25/2019  05/23/2019 PET/CT scan ([id number]) revealed "Small calcified jejunal mesentery nodes, compatible with treated lymphoma. Deauville category 2. No findings suspicious for active lymphoma. Prior splenic  and osseous hypermetabolism is no longer evident."  -Patient has radiographic and pathology findings suggestive of lymphoma recurrence at this time.  -PET CT scan ([id number]) revealed "1. Infiltrative  intensely hypermetabolic soft tissue involving the T12-L1 spine, spinal canal and left posterior paraspinal musculature as detailed, compatible with recurrent lymphoma. Deauville category 5.  #2 T12-L1 relapsed large B-cell lymphoma.  FISH panel does not demonstrate c-Myc rearrangement and is negative for Bcl-2 and BCL6.  PLAN:  -Discussed her lab results to the patient and her husband. Her WBC is 4.7k, hemoglobin of 11.4 k, and platelets of 144k. She is slightly anemic.  -no transfusions indicatde -Reviewed/discussed future plans after her transplant andall the outside records from her Auto HSCT -she currenly about 2 months out after her transpant and is recovering well and has no prohibitive toxicities from her transplant at this time. -antimicrobial prophylaxis per transplant team. -she has a f/u for PET/CT and BM Bx with her transplant tea, at D+100. -we shall see her back after her D+100 re-evaluation.   FOLLOW UP: RTC with Dr Irene Limbo with labs in 2 months  The total time spent in the appointment was 40 minutes* .  All of the patient's questions were answered with apparent satisfaction. The patient knows to call the clinic with any problems, questions or concerns.   Zettie Cooley, am acting as a scribe for Sullivan Lone, MD.  Sullivan Lone MD [place] AAHIVMS [facility] Hematology/Oncology Physician [facility]  .*Total Encounter Time as defined by the Centers for Medicare and Medicaid Services includes, in addition to the face-to-face time of a patient visit (documented in the note above) non-face-to-face time: obtaining and reviewing outside history, ordering and reviewing medications, tests or procedures, care coordination (communications with other health care professionals or caregivers) and documentation in the medical record.

## 2022-05-13 ENCOUNTER — Other Ambulatory Visit: Payer: Self-pay | Admitting: *Deleted

## 2022-05-13 ENCOUNTER — Inpatient Hospital Stay: Payer: Medicaid Other | Attending: Hematology

## 2022-05-13 ENCOUNTER — Inpatient Hospital Stay: Payer: Medicaid Other

## 2022-05-13 ENCOUNTER — Inpatient Hospital Stay (HOSPITAL_BASED_OUTPATIENT_CLINIC_OR_DEPARTMENT_OTHER): Payer: Medicaid Other | Admitting: Hematology

## 2022-05-13 ENCOUNTER — Other Ambulatory Visit: Payer: Self-pay

## 2022-05-13 VITALS — BP 142/90 | HR 78 | Temp 97.9°F | Resp 16 | Ht 60.0 in | Wt 150.2 lb

## 2022-05-13 DIAGNOSIS — C8330 Diffuse large B-cell lymphoma, unspecified site: Secondary | ICD-10-CM | POA: Insufficient documentation

## 2022-05-13 DIAGNOSIS — B370 Candidal stomatitis: Secondary | ICD-10-CM | POA: Diagnosis not present

## 2022-05-13 DIAGNOSIS — Z23 Encounter for immunization: Secondary | ICD-10-CM

## 2022-05-13 DIAGNOSIS — Z9481 Bone marrow transplant status: Secondary | ICD-10-CM | POA: Diagnosis not present

## 2022-05-13 LAB — CBC WITH DIFFERENTIAL (CANCER CENTER ONLY)
Abs Immature Granulocytes: 0.01 10*3/uL (ref 0.00–0.07)
Basophils Absolute: 0 10*3/uL (ref 0.0–0.1)
Basophils Relative: 0 %
Eosinophils Absolute: 0.1 10*3/uL (ref 0.0–0.5)
Eosinophils Relative: 1 %
HCT: 34.1 % — ABNORMAL LOW (ref 36.0–46.0)
Hemoglobin: 11.3 g/dL — ABNORMAL LOW (ref 12.0–15.0)
Immature Granulocytes: 0 %
Lymphocytes Relative: 35 %
Lymphs Abs: 1.6 10*3/uL (ref 0.7–4.0)
MCH: 31.3 pg (ref 26.0–34.0)
MCHC: 33.1 g/dL (ref 30.0–36.0)
MCV: 94.5 fL (ref 80.0–100.0)
Monocytes Absolute: 0.3 10*3/uL (ref 0.1–1.0)
Monocytes Relative: 6 %
Neutro Abs: 2.6 10*3/uL (ref 1.7–7.7)
Neutrophils Relative %: 58 %
Platelet Count: 141 10*3/uL — ABNORMAL LOW (ref 150–400)
RBC: 3.61 MIL/uL — ABNORMAL LOW (ref 3.87–5.11)
RDW: 13.9 % (ref 11.5–15.5)
WBC Count: 4.6 10*3/uL (ref 4.0–10.5)
nRBC: 0 % (ref 0.0–0.2)

## 2022-05-13 LAB — CMP (CANCER CENTER ONLY)
ALT: 21 U/L (ref 0–44)
AST: 24 U/L (ref 15–41)
Albumin: 4.2 g/dL (ref 3.5–5.0)
Alkaline Phosphatase: 85 U/L (ref 38–126)
Anion gap: 6 (ref 5–15)
BUN: 21 mg/dL (ref 8–23)
CO2: 27 mmol/L (ref 22–32)
Calcium: 9.2 mg/dL (ref 8.9–10.3)
Chloride: 106 mmol/L (ref 98–111)
Creatinine: 1.14 mg/dL — ABNORMAL HIGH (ref 0.44–1.00)
GFR, Estimated: 54 mL/min — ABNORMAL LOW (ref >60)
Glucose, Bld: 85 mg/dL (ref 70–99)
Potassium: 3.6 mmol/L (ref 3.5–5.1)
Sodium: 139 mmol/L (ref 135–145)
Total Bilirubin: 0.2 mg/dL — ABNORMAL LOW (ref 0.3–1.2)
Total Protein: 6.9 g/dL (ref 6.5–8.1)

## 2022-05-13 LAB — MAGNESIUM: Magnesium: 1.9 mg/dL (ref 1.7–2.4)

## 2022-05-13 MED ORDER — INFLUENZA VAC A&B SA ADJ QUAD 0.5 ML IM PRSY
0.5000 mL | PREFILLED_SYRINGE | Freq: Once | INTRAMUSCULAR | Status: AC
  Administered 2022-05-13: 0.5 mL via INTRAMUSCULAR
  Filled 2022-05-13: qty 0.5

## 2022-05-13 MED ORDER — INFLUENZA VAC A&B SA ADJ QUAD 0.5 ML IM PRSY: 0.5000 mL | PREFILLED_SYRINGE | Freq: Once | INTRAMUSCULAR | Status: DC

## 2022-05-13 NOTE — Progress Notes (Signed)
Per Dr Irene Limbo patient to receive flu vaccine dose for > 65 years due to her transplant status.

## 2022-05-13 NOTE — Progress Notes (Signed)
HEMATOLOGY/ONCOLOGY CLINIC NOTE  Date of Service: 05/13/2022   Patient Care Team: [name], MD as PCP - General (Family Medicine)  CHIEF COMPLAINTS/PURPOSE OF CONSULTATION:  Follow-up for continued valuation and management of large B-cell lymphoma status post transplant HISTORY OF PRESENTING ILLNESS:  Please see previous notes for details on initial presentation  INTERVAL HISTORY:   Denise Walls is a 61 y.o. 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  She notes that she has been doing well and has had no acute issues with infections or other associated toxicities after transplant. She recently had her day 100 evaluation with her transplant team at [facility] scan on 04/21/2022 which showed no evidence of vesicle lymphoma at this time.  Labs done today were discussed in detail with the patient. She notes throat soreness and was noted to have some mild thrush and was prescribed nystatin mouthwash. She was given flu shot today.  MEDICAL HISTORY:  Past Medical History:  Diagnosis Date   Acute cystitis    Anxiety    Arthritis    Cancer ([place])    Crohn disease ([place])    dx 2004, history of small bowel obstruction 02/2007 treated conservatively   History of colon polyps    History of shingles    HTN (hypertension)    Hypercholesterolemia    Insomnia    Migraine    Uterine fibroid     SURGICAL HISTORY: Past Surgical History:  Procedure Laterality Date   COLONOSCOPY  09/11/2014   Small internal hemorrhoids. Otherwise normal colonoscopy to terminal ileum   COLONOSCOPY  10/02/2017   ESOPHAGOGASTRODUODENOSCOPY  04/05/2007   Mild gastritis. Otherwise, normal esophagogastroduodenoscopy   IR BONE TUMOR(S)RF ABLATION  07/31/2021   IR BONE TUMOR(S)RF ABLATION  07/31/2021   IR CT SPINE LTD  07/31/2021   IR IMAGING GUIDED PORT INSERTION   11/29/2018   IR KYPHO EA ADDL LEVEL THORACIC OR LUMBAR  07/31/2021   IR KYPHO THORACIC WITH BONE BIOPSY  07/31/2021   IR REMOVAL TUN ACCESS W/ PORT W/O FL MOD SED  02/25/2021   LYMPH NODE BIOPSY Left 12/09/2018   Procedure: LEFT DEEP CERVICAL LYMPH NODE BIOPSY;  Surgeon: Fanny Skates, MD;  Location: [facility];  Service: General;  Laterality: Left;   TUBAL LIGATION      SOCIAL HISTORY: Social History   Socioeconomic History   Marital status: Married    Spouse name: Not on file   Number of children: Not on file   Years of education: Not on file   Highest education level: 6th grade  Occupational History   Not on file  Tobacco Use   Smoking status: Never   Smokeless tobacco: Never  Vaping Use   Vaping Use: Never used  Substance and Sexual Activity   Alcohol use: Never   Drug use: Never   Sexual activity: Not Currently    Birth control/protection: Post-menopausal  Other Topics Concern   Not on file  Social History Narrative   Not on file   Social Determinants of Health   Financial Resource Strain: Not on file  Food Insecurity: Not on file  Transportation Needs: No Transportation Needs (08/22/2020)   PRAPARE - Transportation    Lack of Transportation (Medical): No    Lack of Transportation (Non-Medical): No  Physical Activity: Not on file  Stress: Not on file  Social Connections: Not on file  Intimate Partner Violence: Not on file    FAMILY HISTORY: Family History  Problem Relation Age of Onset   Heart disease Father    Arthritis Sister    Heart disease Sister    Colon cancer Neg Hx     ALLERGIES:  is allergic to nsaids.  MEDICATIONS:  Current Outpatient Medications  Medication Sig Dispense Refill   acetaminophen (TYLENOL) 325 MG tablet Take 325-650 mg by mouth every 6 (six) hours as needed for mild pain.     amLODipine (NORVASC) 10 MG tablet Take 1 tablet by mouth daily.     cholecalciferol (VITAMIN D3) 25 MCG (1000 UT) tablet Take 1 tablet (1,000 Units total) by  mouth daily. 30 tablet 3   dexamethasone (DECADRON) 2 MG tablet Take 2 tablets (4 mg total) by mouth 2 (two) times daily. 20 tablet 0   mesalamine (LIALDA) 1.2 g EC tablet Take 2 tablets (2.4 g total) by mouth daily. 180 tablet 4   polyethylene glycol (MIRALAX / GLYCOLAX) 17 g packet Take 17 g by mouth daily as needed for mild constipation. 14 each 0   traMADol (ULTRAM) 50 MG tablet Take 50 mg by mouth every 6 (six) hours as needed for pain.     venlafaxine XR (EFFEXOR-XR) 37.5 MG 24 hr capsule 37.5 mg daily with breakfast.     No current facility-administered medications for this visit.    REVIEW OF SYSTEMS:   10 Point review of Systems was done is negative except as noted above.   PHYSICAL EXAMINATION: ECOG PERFORMANCE STATUS: 2 - Symptomatic, <50% confined to bed  . Vitals:   03/10/22 0953  BP: (!) 135/95  Pulse: 72  Resp: 18  Temp: 97.8 F (36.6 C)  SpO2: 100%    Filed Weights   03/10/22 0953  Weight: 150 lb (68 kg)    .Body mass index is 29.29 kg/m.  NAD GENERAL:alert, in no acute distress and comfortable SKIN: no acute rashes, no significant lesions EYES: conjunctiva are pink and non-injected, sclera anicteric OROPHARYNX: MMM, no exudates, no oropharyngeal erythema or ulceration NECK: supple, no JVD LYMPH:  no palpable lymphadenopathy in the cervical, axillary or inguinal regions LUNGS: clear to auscultation b/l with normal respiratory effort HEART: regular rate & rhythm ABDOMEN:  normoactive bowel sounds , non tender, not distended. Extremity: no pedal edema PSYCH: alert & oriented x 3 with fluent speech NEURO: no focal motor/sensory deficits    LABORATORY DATA:  I have reviewed the data as listed  .    Latest Ref Rng & Units 05/13/2022    1:26 PM 03/10/2022    9:31 AM 09/13/2021   11:59 AM  CBC  WBC 4.0 - 10.5 K/uL 4.6  4.7  5.8   Hemoglobin 12.0 - 15.0 g/dL 11.3  11.4  13.1   Hematocrit 36.0 - 46.0 % 34.1  35.0  39.4   Platelets 150 - 400 K/uL  141  144  242     .    Latest Ref Rng & Units 05/13/2022    1:26 PM 03/10/2022    9:31 AM 09/13/2021   11:59 AM  CMP  Glucose 70 - 99 mg/dL 85  92  89   BUN 8 - 23 mg/dL _0 Creatinine 0.44 - 1.00 mg/dL 1.14  0.83  0.74   Sodium 135 - 145 mmol/L 139  140  136   Potassium 3.5 - 5.1 mmol/L 3.6  4.0  3.4   Chloride 98 -  111 mmol/L 106  107  103   CO2 22 - 32 mmol/L _0 Calcium 8.9 - 10.3 mg/dL 9.2  9.3  8.8   Total Protein 6.5 - 8.1 g/dL 6.9  7.0  6.9   Total Bilirubin 0.3 - 1.2 mg/dL 0.2  0.3  0.4   Alkaline Phos 38 - 126 U/L 85  72  84   AST 15 - 41 U/L 24  35  16   ALT 0 - 44 U/L 21  33  18    SURGICAL PATHOLOGY  CASE: [id number]  PATIENT: Karrigan Carcamo  Surgical Pathology Report      Clinical History: History of lymphoma, post CT and US guided biopsy of  T11-T12 left paraspinal hypermetabolic soft tissue mass (jmc)    FINAL MICROSCOPIC DIAGNOSIS:   A. SOFT TISSUE MASS, T11-T12, LEFT PARASPINAL, NEEDLE CORE BIOPSY:  -  Diffuse large B-cell lymphoma, germinal center subtype  -  See comment   COMMENT:   The core biopsies shows a diffuse infiltrate of malignant appearing  cells with scant to ample eosinophilic cytoplasm and round to irregular  nuclei with prominent eosinophilic nucleoli.  The malignant cells are  infiltrating and surrounding skeletal muscle.  By immunohistochemistry,  the neoplastic cells are positive for CD20, PAX5, CD10 (variable), BCL6  and Bcl-2 but negative for mum 1, CD5, CD30, and EBV by in situ  hybridization.  The proliferative rate by Ki-67 is increased (up to  40%).  CD3 highlights background T cells.  Flow cytometry was attempted  on the sample ([id number]) but there was insufficient material for cyst.    Overall, these findings are consistent with a diffuse large B-cell  lymphoma, germinal center subtype.  FISH studies (high-grade/large  B-cell panel) are pending and will be reported in an addendum.      RADIOGRAPHIC STUDIES: I have personally reviewed the radiological images as listed and agreed with the findings in the report. No results found.  ASSESSMENT & PLAN:   1.  History of stage IV DLBCL, GCB subtype; double hit lymphoma -S/p 1 cycle  of R-CHOP and 5 cycles of da EPOCH-R and IT MTX x 4 for CNS prophylaxis -FISH  on 01/03/2019 and were positive for Myc and BCL2 rearrangement  02/14/2019 PET Scan Whole Body ([id number]) revealed "1. Findings favor complete metabolic response. No residual hypermetabolism within the lymph nodes of the neck, chest, abdomen or pelvis, which have all decreased in size in the interval. 2. Nonspecific new diffuse skeletal hypermetabolism and new mild splenic hypermetabolism, favor reactive state of the marrow and reticuloendothelial system. Spleen is normal size. Continued surveillance with CT or PET-CT advised. 3. Small layering left pleural effusion, decreased. 4.  Aortic Atherosclerosis (ICD10-I70.0)."  -Completed 5 cycles of R-EPOCH on 04/04/2019, 6 total cycles of chemotherapy (1st cycle was R-CHOP) -Completed 4 cycles of IT METHOTREXATE on 04/25/2019  05/23/2019 PET/CT scan ([id number]) revealed "Small calcified jejunal mesentery nodes, compatible with treated lymphoma. Deauville category 2. No findings suspicious for active lymphoma. Prior splenic and osseous hypermetabolism is no longer evident."  -Patient has radiographic and pathology findings suggestive of lymphoma recurrence at this time.  -PET CT scan ([id number]) revealed "1. Infiltrative intensely hypermetabolic soft tissue involving the T12-L1 spine, spinal canal and left posterior paraspinal musculature as detailed, compatible with recurrent lymphoma. Deauville category 5.  #2 T12-L1 relapsed large B-cell lymphoma.  FISH panel does not demonstrate c-Myc rearrangement and is negative for Bcl-2 and BCL6.  PLAN:  -  Patient's labs from today were discussed in detail with her -CBC and  CMP stable -No transfusions indicated -Received flu shot today since she is about 120 days posttransplant. -She will continue her COVID vaccinations as recommended. -She is to follow-up with [facility] for her posttransplant vaccinations starting at the 180 posttransplant. -She has some mild thrush and was given nystatin mouthwash. -PET CT scan at [facility] on 04/21/2022 was reviewed with the patient and shows no evidence of recurrent lymphoma at this time.    FOLLOW UP: RTC with Dr Irene Limbo with labs in 3 months  The total time spent in the appointment was 30 minutes*.  All of the patient's questions were answered with apparent satisfaction. The patient knows to call the clinic with any problems, questions or concerns.   Sullivan Lone MD MS AAHIVMS [facility] Hematology/Oncology Physician [facility]  .*Total Encounter Time as defined by the Centers for Medicare and Medicaid Services includes, in addition to the face-to-face time of a patient visit (documented in the note above) non-face-to-face time: obtaining and reviewing outside history, ordering and reviewing medications, tests or procedures, care coordination (communications with other health care professionals or caregivers) and documentation in the medical record.

## 2022-05-14 MED ORDER — NYSTATIN 100000 UNIT/ML MT SUSP: 5.0000 mL | Freq: Four times a day (QID) | OROMUCOSAL | 0 refills | Status: AC

## 2022-06-16 ENCOUNTER — Telehealth: Payer: Self-pay | Admitting: Gastroenterology

## 2022-06-16 NOTE — Telephone Encounter (Signed)
Spoke with Pt daughter Yasmin. Yasmin stated that the insurance company notified pt that they will not cover the [place] and is requesting an alternative medication:

## 2022-06-16 NOTE — Telephone Encounter (Signed)
Inbound call from patients daughter called stating that patients insurance does not cover Lialda and is requesting a call to discuss if there is a alternative medication she can take. Please advise.

## 2022-06-20 NOTE — Telephone Encounter (Signed)
Does INS company have a list of preferred mesalamine products? If yes, please provide [place] with the list and then we can choose RG

## 2022-06-20 NOTE — Telephone Encounter (Signed)
Denise Walls made aware of [name] recommendations: Denise Walls verbalized understanding with all questions answered.

## 2022-08-04 ENCOUNTER — Other Ambulatory Visit: Payer: Self-pay

## 2022-08-04 DIAGNOSIS — C8330 Diffuse large B-cell lymphoma, unspecified site: Secondary | ICD-10-CM

## 2022-08-05 ENCOUNTER — Inpatient Hospital Stay (HOSPITAL_BASED_OUTPATIENT_CLINIC_OR_DEPARTMENT_OTHER): Payer: Medicare Other | Admitting: Hematology

## 2022-08-05 ENCOUNTER — Inpatient Hospital Stay: Payer: Medicare Other | Attending: Hematology

## 2022-08-05 VITALS — BP 138/75 | HR 89 | Temp 97.8°F | Resp 17 | Ht 60.0 in | Wt 153.6 lb

## 2022-08-05 DIAGNOSIS — Z9481 Bone marrow transplant status: Secondary | ICD-10-CM | POA: Diagnosis not present

## 2022-08-05 DIAGNOSIS — C8330 Diffuse large B-cell lymphoma, unspecified site: Secondary | ICD-10-CM | POA: Diagnosis present

## 2022-08-05 LAB — CBC WITH DIFFERENTIAL (CANCER CENTER ONLY)
Abs Immature Granulocytes: 0.01 10*3/uL (ref 0.00–0.07)
Basophils Absolute: 0 10*3/uL (ref 0.0–0.1)
Basophils Relative: 1 %
Eosinophils Absolute: 0.2 10*3/uL (ref 0.0–0.5)
Eosinophils Relative: 3 %
HCT: 34.5 % — ABNORMAL LOW (ref 36.0–46.0)
Hemoglobin: 11.6 g/dL — ABNORMAL LOW (ref 12.0–15.0)
Immature Granulocytes: 0 %
Lymphocytes Relative: 34 %
Lymphs Abs: 1.7 10*3/uL (ref 0.7–4.0)
MCH: 32 pg (ref 26.0–34.0)
MCHC: 33.6 g/dL (ref 30.0–36.0)
MCV: 95 fL (ref 80.0–100.0)
Monocytes Absolute: 0.4 10*3/uL (ref 0.1–1.0)
Monocytes Relative: 8 %
Neutro Abs: 2.7 10*3/uL (ref 1.7–7.7)
Neutrophils Relative %: 54 %
Platelet Count: 142 10*3/uL — ABNORMAL LOW (ref 150–400)
RBC: 3.63 MIL/uL — ABNORMAL LOW (ref 3.87–5.11)
RDW: 14.2 % (ref 11.5–15.5)
WBC Count: 5 10*3/uL (ref 4.0–10.5)
nRBC: 0 % (ref 0.0–0.2)

## 2022-08-05 LAB — CMP (CANCER CENTER ONLY)
ALT: 20 U/L (ref 0–44)
AST: 23 U/L (ref 15–41)
Albumin: 4.3 g/dL (ref 3.5–5.0)
Alkaline Phosphatase: 98 U/L (ref 38–126)
Anion gap: 7 (ref 5–15)
BUN: 18 mg/dL (ref 8–23)
CO2: 27 mmol/L (ref 22–32)
Calcium: 9.2 mg/dL (ref 8.9–10.3)
Chloride: 105 mmol/L (ref 98–111)
Creatinine: 1 mg/dL (ref 0.44–1.00)
GFR, Estimated: >60 mL/min (ref >60)
Glucose, Bld: 101 mg/dL — ABNORMAL HIGH (ref 70–99)
Potassium: 3.8 mmol/L (ref 3.5–5.1)
Sodium: 139 mmol/L (ref 135–145)
Total Bilirubin: 0.4 mg/dL (ref 0.3–1.2)
Total Protein: 7.2 g/dL (ref 6.5–8.1)

## 2022-08-05 LAB — MAGNESIUM: Magnesium: 2 mg/dL (ref 1.7–2.4)

## 2022-08-05 NOTE — Progress Notes (Signed)
HEMATOLOGY/ONCOLOGY CLINIC NOTE  Date of Service: 08/05/22    Patient Care Team: [name], MD as PCP - General (Family Medicine)  CHIEF COMPLAINTS/PURPOSE OF CONSULTATION:  Follow-up for continued valuation and management of large B-cell lymphoma status post transplant  HISTORY OF PRESENTING ILLNESS:  Please see previous notes for details on initial presentation  INTERVAL HISTORY:   Denise Walls is a 62 y.o. 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 Intrepatation provided by an Astronomer.   Patient was last seen by me on 05/13/2022 and she was doing well overall.   Patient is accompanied by her husband during this visit. Her last visit with Dr. Jolayne Haines was at the end of February. She notes she has been doing well overall. She does complain of headaches in February, but denies headache during this visit.   She notes she has been eating well and has been staying well-hydrated. However, she notes that she does not have a appetite in the morning. She notes that her weight has been stable.   She denies fever, chills, night sweats, unexpected weight loss, abdominal pain, back pain, chest pain, or leg swelling. Patient complains of occasional headache and mild dizziness when she walks.   Patient also reports occasional tingling sensation on tip of her tongue for couple of seconds.   She has been drinking well. Her husband notes she occasionally skips meals.    Patient reports she felt enlarged lymph node near her left sie of her neck due to sore throat.   She regularly follows-up with her transplant team. She is unsure when her transplant team has scheduled her for post-transplant vaccines.  -Discussed lab results from today, 08/05/2022, with the patient. CBC shows slightly decreased hemoglobin at 11.6, decreased hematocrit of 34.5 %, and decreased  platelets at 142 K. CMP is pending. -We will confirm with her transplant team when she is going to receive her post-transplant vaccines.     MEDICAL HISTORY:  Past Medical History:  Diagnosis Date   Acute cystitis    Anxiety    Arthritis    Cancer ([place])    Crohn disease ([place])    dx 2004, history of small bowel obstruction 02/2007 treated conservatively   History of colon polyps    History of shingles    HTN (hypertension)    Hypercholesterolemia    Insomnia    Migraine    Uterine fibroid     SURGICAL HISTORY: Past Surgical History:  Procedure Laterality Date   COLONOSCOPY  09/11/2014   Small internal hemorrhoids. Otherwise normal colonoscopy to terminal ileum   COLONOSCOPY  10/02/2017   ESOPHAGOGASTRODUODENOSCOPY  04/05/2007   Mild gastritis. Otherwise, normal esophagogastroduodenoscopy   IR BONE TUMOR(S)RF ABLATION  07/31/2021   IR BONE TUMOR(S)RF ABLATION  07/31/2021   IR CT SPINE LTD  07/31/2021   IR IMAGING GUIDED PORT INSERTION  11/29/2018   IR KYPHO EA ADDL LEVEL THORACIC OR LUMBAR  07/31/2021   IR KYPHO THORACIC WITH BONE BIOPSY  07/31/2021   IR REMOVAL TUN ACCESS W/ PORT W/O FL MOD SED  02/25/2021   LYMPH NODE BIOPSY Left 12/09/2018   Procedure: LEFT DEEP CERVICAL LYMPH NODE BIOPSY;  Surgeon: Fanny Skates, MD;  Location: [facility];  Service: General;  Laterality: Left;   TUBAL LIGATION      SOCIAL HISTORY: Social History   Socioeconomic History   Marital status: Married  Spouse name: Not on file   Number of children: Not on file   Years of education: Not on file   Highest education level: 6th grade  Occupational History   Not on file  Tobacco Use   Smoking status: Never   Smokeless tobacco: Never  Vaping Use   Vaping Use: Never used  Substance and Sexual Activity   Alcohol use: Never   Drug use: Never   Sexual activity: Not Currently    Birth control/protection: Post-menopausal  Other Topics Concern   Not on file  Social History Narrative   Not on  file   Social Determinants of Health   Financial Resource Strain: Not on file  Food Insecurity: Not on file  Transportation Needs: No Transportation Needs (08/22/2020)   PRAPARE - Transportation    Lack of Transportation (Medical): No    Lack of Transportation (Non-Medical): No  Physical Activity: Not on file  Stress: Not on file  Social Connections: Not on file  Intimate Partner Violence: Not on file    FAMILY HISTORY: Family History  Problem Relation Age of Onset   Heart disease Father    Arthritis Sister    Heart disease Sister    Colon cancer Neg Hx     ALLERGIES:  is allergic to nsaids.  MEDICATIONS:  Current Outpatient Medications  Medication Sig Dispense Refill   acetaminophen (TYLENOL) 325 MG tablet Take 325-650 mg by mouth every 6 (six) hours as needed for mild pain.     amLODipine (NORVASC) 10 MG tablet Take 1 tablet by mouth daily.     cholecalciferol (VITAMIN D3) 25 MCG (1000 UT) tablet Take 1 tablet (1,000 Units total) by mouth daily. 30 tablet 3   dexamethasone (DECADRON) 2 MG tablet Take 2 tablets (4 mg total) by mouth 2 (two) times daily. 20 tablet 0   mesalamine (LIALDA) 1.2 g EC tablet Take 2 tablets (2.4 g total) by mouth daily. 180 tablet 4   nystatin (MYCOSTATIN) 100000 UNIT/ML suspension Take 5 mLs (500,000 Units total) by mouth 4 (four) times daily. Swish in the mouth and throat and then swallow 200 mL 0   polyethylene glycol (MIRALAX / GLYCOLAX) 17 g packet Take 17 g by mouth daily as needed for mild constipation. 14 each 0   traMADol (ULTRAM) 50 MG tablet Take 50 mg by mouth every 6 (six) hours as needed for pain.     venlafaxine XR (EFFEXOR-XR) 37.5 MG 24 hr capsule 37.5 mg daily with breakfast.     No current facility-administered medications for this visit.    REVIEW OF SYSTEMS:   10 Point review of Systems was done is negative except as noted above.   PHYSICAL EXAMINATION: ECOG PERFORMANCE STATUS: 2 - Symptomatic, <50% confined to  bed  . Vitals:   08/05/22 1352  BP: 138/75  Pulse: 89  Resp: 17  Temp: 97.8 F (36.6 C)  SpO2: 100%     Filed Weights   08/05/22 1352  Weight: 153 lb 9.6 oz (69.7 kg)     .Body mass index is 30 kg/m.  NAD GENERAL:alert, in no acute distress and comfortable SKIN: no acute rashes, no significant lesions EYES: conjunctiva are pink and non-injected, sclera anicteric OROPHARYNX: MMM, no exudates, no oropharyngeal erythema or ulceration NECK: supple, no JVD LYMPH:  no palpable lymphadenopathy in the cervical, axillary or inguinal regions LUNGS: clear to auscultation b/l with normal respiratory effort HEART: regular rate & rhythm ABDOMEN:  normoactive bowel sounds , non tender, not distended.  Extremity: no pedal edema PSYCH: alert & oriented x 3 with fluent speech NEURO: no focal motor/sensory deficits    LABORATORY DATA:  I have reviewed the data as listed  .    Latest Ref Rng & Units 08/05/2022    1:38 PM 05/13/2022    1:26 PM 03/10/2022    9:31 AM  CBC  WBC 4.0 - 10.5 K/uL 5.0  4.6  4.7   Hemoglobin 12.0 - 15.0 g/dL 11.6  11.3  11.4   Hematocrit 36.0 - 46.0 % 34.5  34.1  35.0   Platelets 150 - 400 K/uL 142  141  144     .    Latest Ref Rng & Units 08/05/2022    1:38 PM 05/13/2022    1:26 PM 03/10/2022    9:31 AM  CMP  Glucose 70 - 99 mg/dL 101  85  92   BUN 8 - 23 mg/dL 18  21  16    Creatinine 0.44 - 1.00 mg/dL 1.00  1.14  0.83   Sodium 135 - 145 mmol/L 139  139  140   Potassium 3.5 - 5.1 mmol/L 3.8  3.6  4.0   Chloride 98 - 111 mmol/L 105  106  107   CO2 22 - 32 mmol/L 27  27  26    Calcium 8.9 - 10.3 mg/dL 9.2  9.2  9.3   Total Protein 6.5 - 8.1 g/dL 7.2  6.9  7.0   Total Bilirubin 0.3 - 1.2 mg/dL 0.4  0.2  0.3   Alkaline Phos 38 - 126 U/L 98  85  72   AST 15 - 41 U/L 23  24  35   ALT 0 - 44 U/L 20  21  33    SURGICAL PATHOLOGY  CASE: [id number]  PATIENT: Athenia Dickison  Surgical Pathology Report      Clinical History: History of  lymphoma, post CT and US guided biopsy of  T11-T12 left paraspinal hypermetabolic soft tissue mass (jmc)    FINAL MICROSCOPIC DIAGNOSIS:   A. SOFT TISSUE MASS, T11-T12, LEFT PARASPINAL, NEEDLE CORE BIOPSY:  -  Diffuse large B-cell lymphoma, germinal center subtype  -  See comment   COMMENT:   The core biopsies shows a diffuse infiltrate of malignant appearing  cells with scant to ample eosinophilic cytoplasm and round to irregular  nuclei with prominent eosinophilic nucleoli.  The malignant cells are  infiltrating and surrounding skeletal muscle.  By immunohistochemistry,  the neoplastic cells are positive for CD20, PAX5, CD10 (variable), BCL6  and Bcl-2 but negative for mum 1, CD5, CD30, and EBV by in situ  hybridization.  The proliferative rate by Ki-67 is increased (up to  40%).  CD3 highlights background T cells.  Flow cytometry was attempted  on the sample ([id number]) but there was insufficient material for cyst.    Overall, these findings are consistent with a diffuse large B-cell  lymphoma, germinal center subtype.  FISH studies (high-grade/large  B-cell panel) are pending and will be reported in an addendum.     RADIOGRAPHIC STUDIES: I have personally reviewed the radiological images as listed and agreed with the findings in the report. No results found.  ASSESSMENT & PLAN:   1.  History of stage IV DLBCL, GCB subtype; double hit lymphoma -S/p 1 cycle  of R-CHOP and 5 cycles of da EPOCH-R and IT MTX x 4 for CNS prophylaxis -FISH  on 01/03/2019 and were positive for Myc and BCL2 rearrangement  02/14/2019 PET Scan  Whole Body ([id number]) revealed "1. Findings favor complete metabolic response. No residual hypermetabolism within the lymph nodes of the neck, chest, abdomen or pelvis, which have all decreased in size in the interval. 2. Nonspecific new diffuse skeletal hypermetabolism and new mild splenic hypermetabolism, favor reactive state of the marrow and  reticuloendothelial system. Spleen is normal size. Continued surveillance with CT or PET-CT advised. 3. Small layering left pleural effusion, decreased. 4.  Aortic Atherosclerosis (ICD10-I70.0)."  -Completed 5 cycles of R-EPOCH on 04/04/2019, 6 total cycles of chemotherapy (1st cycle was R-CHOP) -Completed 4 cycles of IT METHOTREXATE on 04/25/2019  05/23/2019 PET/CT scan ([id number]) revealed "Small calcified jejunal mesentery nodes, compatible with treated lymphoma. Deauville category 2. No findings suspicious for active lymphoma. Prior splenic and osseous hypermetabolism is no longer evident."  -Patient has radiographic and pathology findings suggestive of lymphoma recurrence at this time.  -PET CT scan ([id number]) revealed "1. Infiltrative intensely hypermetabolic soft tissue involving the T12-L1 spine, spinal canal and left posterior paraspinal musculature as detailed, compatible with recurrent lymphoma. Deauville category 5.  #2 T12-L1 relapsed large B-cell lymphoma.  FISH panel does not demonstrate c-Myc rearrangement and is negative for Bcl-2 and BCL6.  PLAN:  -Patient's labs from today were discussed in detail with her -CBC and CMP stable -No transfusions indicated -Received flu shot today since she is about 120 days posttransplant. -She will continue her COVID vaccinations as recommended. -She is to [facility] with [facility] for her posttransplant vaccinations starting at the 180 posttransplant. -She has some mild thrush and was given nystatin mouthwash. -PET CT scan at [facility] on 04/21/2022 was reviewed with the patient and shows no evidence of recurrent lymphoma at this time.    FOLLOW UP: RTC with Dr Irene Limbo with labs in 3 months  The total time spent in the appointment was 30 minutes* .  All of the patient's questions were answered with apparent satisfaction. The patient knows to call the clinic with any problems, questions or concerns.   Sullivan Lone MD MS  AAHIVMS [facility] Hematology/Oncology Physician [facility]  .*Total Encounter Time as defined by the Centers for Medicare and Medicaid Services includes, in addition to the face-to-face time of a patient visit (documented in the note above) non-face-to-face time: obtaining and reviewing outside history, ordering and reviewing medications, tests or procedures, care coordination (communications with other health care professionals or caregivers) and documentation in the medical record.   I, Cleda Mccreedy, am acting as a Education administrator for Sullivan Lone, MD.  .I have reviewed the above documentation for accuracy and completeness, and I agree with the above. [name] MD

## 2022-09-04 ENCOUNTER — Other Ambulatory Visit: Payer: Self-pay

## 2022-09-04 DIAGNOSIS — C8330 Diffuse large B-cell lymphoma, unspecified site: Secondary | ICD-10-CM

## 2022-10-07 ENCOUNTER — Telehealth: Payer: Self-pay | Admitting: Gastroenterology

## 2022-10-07 ENCOUNTER — Other Ambulatory Visit: Payer: Self-pay

## 2022-10-07 MED ORDER — MESALAMINE 1.2 G PO TBEC: 2.4000 g | DELAYED_RELEASE_TABLET | Freq: Every day | ORAL | 0 refills | Status: DC

## 2022-10-07 NOTE — Telephone Encounter (Signed)
Patient's daughter called requesting a refill for mesalamine medication to last patient until her OV on 07/31. Please advise, thank you.

## 2022-10-07 NOTE — Telephone Encounter (Signed)
Refills sent to patient pharmacy to last her until her appointment.

## 2022-10-21 ENCOUNTER — Telehealth: Payer: Self-pay | Admitting: Gastroenterology

## 2022-10-21 NOTE — Telephone Encounter (Signed)
Inbound call from [facility] on patient's behlad. Stated the the medication mesalamine is not covered by patient's insurance. Requesting a call back at [phone] to discuss alternatives that are covered. Please advise, thank you.

## 2022-10-21 NOTE — Telephone Encounter (Signed)
Can I see about a PA on this medication mesalamine please?

## 2022-10-24 ENCOUNTER — Other Ambulatory Visit (HOSPITAL_COMMUNITY): Payer: Self-pay

## 2022-10-24 NOTE — Telephone Encounter (Signed)
Per test claim the preferred alternatives are as follows:

## 2022-10-24 NOTE — Telephone Encounter (Signed)
Dr Chales Abrahams please advise to the alternative medication for this patient please. They are on mesalamine but its not covered by their insurance

## 2022-10-28 NOTE — Telephone Encounter (Signed)
She's on 1.2g.She take 2 tablets daily. Would you like her to switch to 0.375 and if so how should the script be written?

## 2022-10-28 NOTE — Telephone Encounter (Signed)
His mesalamine ER 0.375 covered? RG

## 2022-10-29 MED ORDER — MESALAMINE ER 0.375 G PO CP24: 1.5000 g | ORAL_CAPSULE | Freq: Every day | ORAL | 11 refills | Status: DC

## 2022-10-29 NOTE — Telephone Encounter (Signed)
So this would be mesalamine ER (same as Apriso) 0.375mg /tab- 4 tabs a day #120, 11 RG RG

## 2022-10-29 NOTE — Telephone Encounter (Signed)
Patient's daughter notified and medication was sent in

## 2022-11-05 ENCOUNTER — Inpatient Hospital Stay (HOSPITAL_BASED_OUTPATIENT_CLINIC_OR_DEPARTMENT_OTHER): Payer: Medicare HMO | Admitting: Hematology

## 2022-11-05 ENCOUNTER — Other Ambulatory Visit: Payer: Self-pay

## 2022-11-05 ENCOUNTER — Inpatient Hospital Stay: Payer: Medicare HMO | Attending: Hematology

## 2022-11-05 VITALS — BP 154/99 | HR 77 | Temp 98.1°F | Resp 17 | Wt 160.4 lb

## 2022-11-05 DIAGNOSIS — C8330 Diffuse large B-cell lymphoma, unspecified site: Secondary | ICD-10-CM | POA: Diagnosis not present

## 2022-11-05 DIAGNOSIS — Z8572 Personal history of non-Hodgkin lymphomas: Secondary | ICD-10-CM | POA: Insufficient documentation

## 2022-11-05 DIAGNOSIS — Z9221 Personal history of antineoplastic chemotherapy: Secondary | ICD-10-CM | POA: Insufficient documentation

## 2022-11-05 LAB — CMP (CANCER CENTER ONLY)
ALT: 24 U/L (ref 0–44)
AST: 26 U/L (ref 15–41)
Albumin: 3.9 g/dL (ref 3.5–5.0)
Alkaline Phosphatase: 77 U/L (ref 38–126)
Anion gap: 6 (ref 5–15)
BUN: 17 mg/dL (ref 8–23)
CO2: 29 mmol/L (ref 22–32)
Calcium: 9.1 mg/dL (ref 8.9–10.3)
Chloride: 104 mmol/L (ref 98–111)
Creatinine: 0.86 mg/dL (ref 0.44–1.00)
GFR, Estimated: >60 mL/min (ref >60)
Glucose, Bld: 97 mg/dL (ref 70–99)
Potassium: 3.9 mmol/L (ref 3.5–5.1)
Sodium: 139 mmol/L (ref 135–145)
Total Bilirubin: 0.3 mg/dL (ref 0.3–1.2)
Total Protein: 6.5 g/dL (ref 6.5–8.1)

## 2022-11-05 LAB — CBC WITH DIFFERENTIAL (CANCER CENTER ONLY)
Abs Immature Granulocytes: 0.01 10*3/uL (ref 0.00–0.07)
Basophils Absolute: 0 10*3/uL (ref 0.0–0.1)
Basophils Relative: 0 %
Eosinophils Absolute: 0.1 10*3/uL (ref 0.0–0.5)
Eosinophils Relative: 1 %
HCT: 35.3 % — ABNORMAL LOW (ref 36.0–46.0)
Hemoglobin: 11.5 g/dL — ABNORMAL LOW (ref 12.0–15.0)
Immature Granulocytes: 0 %
Lymphocytes Relative: 43 %
Lymphs Abs: 2.3 10*3/uL (ref 0.7–4.0)
MCH: 31.4 pg (ref 26.0–34.0)
MCHC: 32.6 g/dL (ref 30.0–36.0)
MCV: 96.4 fL (ref 80.0–100.0)
Monocytes Absolute: 0.5 10*3/uL (ref 0.1–1.0)
Monocytes Relative: 9 %
Neutro Abs: 2.5 10*3/uL (ref 1.7–7.7)
Neutrophils Relative %: 47 %
Platelet Count: 135 10*3/uL — ABNORMAL LOW (ref 150–400)
RBC: 3.66 MIL/uL — ABNORMAL LOW (ref 3.87–5.11)
RDW: 14.7 % (ref 11.5–15.5)
WBC Count: 5.3 10*3/uL (ref 4.0–10.5)
nRBC: 0 % (ref 0.0–0.2)

## 2022-11-05 LAB — MAGNESIUM: Magnesium: 2.1 mg/dL (ref 1.7–2.4)

## 2022-11-05 NOTE — Progress Notes (Signed)
HEMATOLOGY/ONCOLOGY CLINIC NOTE  Date of Service: 11/05/22    Patient Care Team: Everlean Cherry, MD as PCP - General (Family Medicine)  CHIEF COMPLAINTS/PURPOSE OF CONSULTATION:  Follow-up for continued valuation and management of large B-cell lymphoma status post transplant  HISTORY OF PRESENTING ILLNESS:  Please see previous notes for details on initial presentation  INTERVAL HISTORY:   Denise Walls is a 62 y.o. 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 Intrepatation provided by an Equities trader.   Patient was last seen by me on 08/05/2022 and complained of loss of appetite in the mornings. She also complained of occasional headache, mild dizziness, occasional tongue tingling, enlarged lymph node on left side of her neck.   Today, she is accompanied by her husband and a spanish interpreter. She complains of left-sided back pain when straining the back. She notes that the pain is present with activities such as washing dishes. Patient reports that she needs to bend over or move to temporarily resolve the pain. She denies any pain with regular walking. She reports that the pain is generally present when standing for extended periods. She reports that she did have back pain at the time of her February CT scan.   Patient has been eating well and she has gained 7 pounds in the past 3 months. She complains of leg stiffness. She denies any abdominal pain or change in urination/bowel habits.  She has been receiving post-transplant vaccines with her transplant team and will receive her next vaccine on August 22nd.   MEDICAL HISTORY:  Past Medical History:  Diagnosis Date   Acute cystitis    Anxiety    Arthritis    Cancer (HCC)    Crohn disease (HCC)    dx 2004, history of small bowel obstruction 02/2007 treated conservatively   History of colon polyps     History of shingles    HTN (hypertension)    Hypercholesterolemia    Insomnia    Migraine    Uterine fibroid     SURGICAL HISTORY: Past Surgical History:  Procedure Laterality Date   COLONOSCOPY  09/11/2014   Small internal hemorrhoids. Otherwise normal colonoscopy to terminal ileum   COLONOSCOPY  10/02/2017   ESOPHAGOGASTRODUODENOSCOPY  04/05/2007   Mild gastritis. Otherwise, normal esophagogastroduodenoscopy   IR BONE TUMOR(S)RF ABLATION  07/31/2021   IR BONE TUMOR(S)RF ABLATION  07/31/2021   IR CT SPINE LTD  07/31/2021   IR IMAGING GUIDED PORT INSERTION  11/29/2018   IR KYPHO EA ADDL LEVEL THORACIC OR LUMBAR  07/31/2021   IR KYPHO THORACIC WITH BONE BIOPSY  07/31/2021   IR REMOVAL TUN ACCESS W/ PORT W/O FL MOD SED  02/25/2021   LYMPH NODE BIOPSY Left 12/09/2018   Procedure: LEFT DEEP CERVICAL LYMPH NODE BIOPSY;  Surgeon: Claud Kelp, MD;  Location: [facility] OR;  Service: General;  Laterality: Left;   TUBAL LIGATION      SOCIAL HISTORY: Social History   Socioeconomic History   Marital status: Married    Spouse name: Not on file   Number of children: Not on file   Years of education: Not on file   Highest education level: 6th grade  Occupational History   Not on file  Tobacco Use   Smoking status: Never   Smokeless tobacco: Never  Vaping Use   Vaping Use: Never used  Substance and Sexual Activity   Alcohol use: Never  Drug use: Never   Sexual activity: Not Currently    Birth control/protection: Post-menopausal  Other Topics Concern   Not on file  Social History Narrative   Not on file   Social Determinants of Health   Financial Resource Strain: Not on file  Food Insecurity: Not on file  Transportation Needs: No Transportation Needs (08/22/2020)   PRAPARE - Transportation    Lack of Transportation (Medical): No    Lack of Transportation (Non-Medical): No  Physical Activity: Not on file  Stress: Not on file  Social Connections: Not on file  Intimate Partner  Violence: Not on file    FAMILY HISTORY: Family History  Problem Relation Age of Onset   Heart disease Father    Arthritis Sister    Heart disease Sister    Colon cancer Neg Hx     ALLERGIES:  is allergic to nsaids.  MEDICATIONS:  Current Outpatient Medications  Medication Sig Dispense Refill   acetaminophen (TYLENOL) 325 MG tablet Take 325-650 mg by mouth every 6 (six) hours as needed for mild pain.     amLODipine (NORVASC) 10 MG tablet Take 1 tablet by mouth daily.     cholecalciferol (VITAMIN D3) 25 MCG (1000 UT) tablet Take 1 tablet (1,000 Units total) by mouth daily. 30 tablet 3   dexamethasone (DECADRON) 2 MG tablet Take 2 tablets (4 mg total) by mouth 2 (two) times daily. (Patient not taking: Reported on 08/05/2022) 20 tablet 0   mesalamine (APRISO) 0.375 g 24 hr capsule Take 4 capsules (1.5 g total) by mouth daily. 120 capsule 11   nystatin (MYCOSTATIN) 100000 UNIT/ML suspension Take 5 mLs (500,000 Units total) by mouth 4 (four) times daily. Swish in the mouth and throat and then swallow (Patient not taking: Reported on 08/05/2022) 200 mL 0   polyethylene glycol (MIRALAX / GLYCOLAX) 17 g packet Take 17 g by mouth daily as needed for mild constipation. (Patient not taking: Reported on 08/05/2022) 14 each 0   traMADol (ULTRAM) 50 MG tablet Take 50 mg by mouth every 6 (six) hours as needed for pain. (Patient not taking: Reported on 08/05/2022)     venlafaxine XR (EFFEXOR-XR) 37.5 MG 24 hr capsule 37.5 mg daily with breakfast.     No current facility-administered medications for this visit.    REVIEW OF SYSTEMS:    10 Point review of Systems was done is negative except as noted above.   PHYSICAL EXAMINATION: ECOG PERFORMANCE STATUS: 2 - Symptomatic, <50% confined to bed  Vitals:   11/05/22 1332  BP: (!) 154/99  Pulse: 77  Resp: 17  Temp: 98.1 F (36.7 C)  SpO2: 100%    Filed Weights   11/05/22 1332  Weight: 160 lb 6.4 oz (72.8 kg)    .Body mass index is 31.33  kg/m.   GENERAL:alert, in no acute distress and comfortable SKIN: no acute rashes, no significant lesions EYES: conjunctiva are pink and non-injected, sclera anicteric OROPHARYNX: MMM, no exudates, no oropharyngeal erythema or ulceration NECK: supple, no JVD LYMPH:  no palpable lymphadenopathy in the cervical, axillary or inguinal regions LUNGS: clear to auscultation b/l with normal respiratory effort HEART: regular rate & rhythm ABDOMEN:  normoactive bowel sounds , non tender, not distended. No palpable hepatosplenomegaly Extremity: no pedal edema PSYCH: alert & oriented x 3 with fluent speech NEURO: no focal motor/sensory deficits   LABORATORY DATA:  I have reviewed the data as listed  .    Latest Ref Rng & Units 08/05/2022  1:38 PM 05/13/2022    1:26 PM 03/10/2022    9:31 AM  CBC  WBC 4.0 - 10.5 K/uL 5.0  4.6  4.7   Hemoglobin 12.0 - 15.0 g/dL 16.1  09.6  04.5   Hematocrit 36.0 - 46.0 % 34.5  34.1  35.0   Platelets 150 - 400 K/uL 142  141  144     .    Latest Ref Rng & Units 08/05/2022    1:38 PM 05/13/2022    1:26 PM 03/10/2022    9:31 AM  CMP  Glucose 70 - 99 mg/dL 409  85  92   BUN 8 - 23 mg/dL 18  21  16    Creatinine 0.44 - 1.00 mg/dL 8.11  9.14  7.82   Sodium 135 - 145 mmol/L 139  139  140   Potassium 3.5 - 5.1 mmol/L 3.8  3.6  4.0   Chloride 98 - 111 mmol/L 105  106  107   CO2 22 - 32 mmol/L 27  27  26    Calcium 8.9 - 10.3 mg/dL 9.2  9.2  9.3   Total Protein 6.5 - 8.1 g/dL 7.2  6.9  7.0   Total Bilirubin 0.3 - 1.2 mg/dL 0.4  0.2  0.3   Alkaline Phos 38 - 126 U/L 98  85  72   AST 15 - 41 U/L 23  24  35   ALT 0 - 44 U/L 20  21  33    SURGICAL PATHOLOGY  CASE: [id number]  PATIENT: Delisa Batz  Surgical Pathology Report      Clinical History: History of lymphoma, post CT and US guided biopsy of  T11-T12 left paraspinal hypermetabolic soft tissue mass (jmc)    FINAL MICROSCOPIC DIAGNOSIS:   A. SOFT TISSUE MASS, T11-T12, LEFT PARASPINAL,  NEEDLE CORE BIOPSY:  -  Diffuse large B-cell lymphoma, germinal center subtype  -  See comment   COMMENT:   The core biopsies shows a diffuse infiltrate of malignant appearing  cells with scant to ample eosinophilic cytoplasm and round to irregular  nuclei with prominent eosinophilic nucleoli.  The malignant cells are  infiltrating and surrounding skeletal muscle.  By immunohistochemistry,  the neoplastic cells are positive for CD20, PAX5, CD10 (variable), BCL6  and Bcl-2 but negative for mum 1, CD5, CD30, and EBV by in situ  hybridization.  The proliferative rate by Ki-67 is increased (up to  40%).  CD3 highlights background T cells.  Flow cytometry was attempted  on the sample ([id number]) but there was insufficient material for cyst.    Overall, these findings are consistent with a diffuse large B-cell  lymphoma, germinal center subtype.  FISH studies (high-grade/large  B-cell panel) are pending and will be reported in an addendum.     RADIOGRAPHIC STUDIES: I have personally reviewed the radiological images as listed and agreed with the findings in the report. No results found.  ASSESSMENT & PLAN:   1.  History of stage IV DLBCL, GCB subtype; double hit lymphoma -S/p 1 cycle  of R-CHOP and 5 cycles of da EPOCH-R and IT MTX x 4 for CNS prophylaxis -FISH  on 01/03/2019 and were positive for Myc and BCL2 rearrangement  02/14/2019 PET Scan Whole Body ([id number]) revealed "1. Findings favor complete metabolic response. No residual hypermetabolism within the lymph nodes of the neck, chest, abdomen or pelvis, which have all decreased in size in the interval. 2. Nonspecific new diffuse skeletal hypermetabolism and new mild splenic hypermetabolism, favor  reactive state of the marrow and reticuloendothelial system. Spleen is normal size. Continued surveillance with CT or PET-CT advised. 3. Small layering left pleural effusion, decreased. 4.  Aortic Atherosclerosis  (ICD10-I70.0)."  -Completed 5 cycles of R-EPOCH on 04/04/2019, 6 total cycles of chemotherapy (1st cycle was R-CHOP) -Completed 4 cycles of IT METHOTREXATE on 04/25/2019  05/23/2019 PET/CT scan ([id number]) revealed "Small calcified jejunal mesentery nodes, compatible with treated lymphoma. Deauville category 2. No findings suspicious for active lymphoma. Prior splenic and osseous hypermetabolism is no longer evident."  -Patient has radiographic and pathology findings suggestive of lymphoma recurrence at this time.  -PET CT scan ([id number]) revealed "1. Infiltrative intensely hypermetabolic soft tissue involving the T12-L1 spine, spinal canal and left posterior paraspinal musculature as detailed, compatible with recurrent lymphoma. Deauville category 5.  #2 T12-L1 relapsed large B-cell lymphoma.  FISH panel does not demonstrate c-Myc rearrangement and is negative for Bcl-2 and BCL6.  PLAN:   -Discussed lab results on 11/05/2022 in detail with patient. CBC stable, showed WBC of 5.3K, hemoglobin of 11.5, and platelets of 135K. -CMP stable -Magnesium level normal -Last CT chest/abdomen/pelvis scan in February was normal and showed no new concerns for lymphoma -lymphoma previously in spine did damage some of the spine. This could cause disc issues or changes in muscles in curvature of the spine which could be causing pain -discussed MRI to further evaluate spine for disc issues of other factors that may cause chronic back pain -advised patient to confirm with transplant team when they are planning to repeat scans -informed patient that treatment may have casued her to lose most of her muscle mass supporting the spine  -advised patient to gradually stay active to improve core muscle strength and potentially improve back pain -advised patient to stay regularly hydrated -advised patient to regularly follow with PCP to stay UTD with age-appropriate cancer screenings including mammogram and  colonoscopy -answered all of patient's questions regarding her back pain -discussed option of repeat scan, patient would like to hold off on CT scan at this time -will continue to monitor with labs in 3 months -continue to follow-up with [facility] for her posttransplant vaccinations   FOLLOW UP: RTC with Dr Candise Che with labs in 3 months  The total time spent in the appointment was 25 minutes* .  All of the patient's questions were answered with apparent satisfaction. The patient knows to call the clinic with any problems, questions or concerns.   Wyvonnia Lora MD MS AAHIVMS [facility] Hematology/Oncology Physician [facility]  .*Total Encounter Time as defined by the Centers for Medicare and Medicaid Services includes, in addition to the face-to-face time of a patient visit (documented in the note above) non-face-to-face time: obtaining and reviewing outside history, ordering and reviewing medications, tests or procedures, care coordination (communications with other health care professionals or caregivers) and documentation in the medical record.    I,Mitra Faeizi,acting as a Neurosurgeon for Wyvonnia Lora, MD.,have documented all relevant documentation on the behalf of Wyvonnia Lora, MD,as directed by  Wyvonnia Lora, MD while in the presence of Wyvonnia Lora, MD.  .I have reviewed the above documentation for accuracy and completeness, and I agree with the above. Johney Maine MD

## 2022-12-17 ENCOUNTER — Ambulatory Visit: Payer: Medicare HMO | Admitting: Physician Assistant

## 2023-02-03 ENCOUNTER — Other Ambulatory Visit: Payer: Self-pay

## 2023-02-03 DIAGNOSIS — C8330 Diffuse large B-cell lymphoma, unspecified site: Secondary | ICD-10-CM

## 2023-02-04 ENCOUNTER — Inpatient Hospital Stay (HOSPITAL_BASED_OUTPATIENT_CLINIC_OR_DEPARTMENT_OTHER): Payer: Medicare HMO | Admitting: Hematology

## 2023-02-04 ENCOUNTER — Inpatient Hospital Stay: Payer: Medicare HMO | Attending: Hematology

## 2023-02-04 VITALS — BP 160/84 | HR 71 | Temp 97.3°F | Resp 18 | Wt 160.1 lb

## 2023-02-04 DIAGNOSIS — Z79899 Other long term (current) drug therapy: Secondary | ICD-10-CM | POA: Insufficient documentation

## 2023-02-04 DIAGNOSIS — Z9481 Bone marrow transplant status: Secondary | ICD-10-CM

## 2023-02-04 DIAGNOSIS — M549 Dorsalgia, unspecified: Secondary | ICD-10-CM | POA: Insufficient documentation

## 2023-02-04 DIAGNOSIS — C8330 Diffuse large B-cell lymphoma, unspecified site: Secondary | ICD-10-CM

## 2023-02-04 LAB — CMP (CANCER CENTER ONLY)
ALT: 21 U/L (ref 0–44)
AST: 21 U/L (ref 15–41)
Albumin: 4.3 g/dL (ref 3.5–5.0)
Alkaline Phosphatase: 79 U/L (ref 38–126)
Anion gap: 6 (ref 5–15)
BUN: 15 mg/dL (ref 8–23)
CO2: 27 mmol/L (ref 22–32)
Calcium: 9.2 mg/dL (ref 8.9–10.3)
Chloride: 104 mmol/L (ref 98–111)
Creatinine: 0.9 mg/dL (ref 0.44–1.00)
GFR, Estimated: >60 mL/min (ref >60)
Glucose, Bld: 89 mg/dL (ref 70–99)
Potassium: 4 mmol/L (ref 3.5–5.1)
Sodium: 137 mmol/L (ref 135–145)
Total Bilirubin: 0.4 mg/dL (ref 0.3–1.2)
Total Protein: 7.3 g/dL (ref 6.5–8.1)

## 2023-02-04 LAB — CBC WITH DIFFERENTIAL (CANCER CENTER ONLY)
Abs Immature Granulocytes: 0.01 10*3/uL (ref 0.00–0.07)
Basophils Absolute: 0 10*3/uL (ref 0.0–0.1)
Basophils Relative: 1 %
Eosinophils Absolute: 0.1 10*3/uL (ref 0.0–0.5)
Eosinophils Relative: 2 %
HCT: 37.2 % (ref 36.0–46.0)
Hemoglobin: 12.6 g/dL (ref 12.0–15.0)
Immature Granulocytes: 0 %
Lymphocytes Relative: 46 %
Lymphs Abs: 2.2 10*3/uL (ref 0.7–4.0)
MCH: 32.3 pg (ref 26.0–34.0)
MCHC: 33.9 g/dL (ref 30.0–36.0)
MCV: 95.4 fL (ref 80.0–100.0)
Monocytes Absolute: 0.4 10*3/uL (ref 0.1–1.0)
Monocytes Relative: 8 %
Neutro Abs: 2.1 10*3/uL (ref 1.7–7.7)
Neutrophils Relative %: 43 %
Platelet Count: 154 10*3/uL (ref 150–400)
RBC: 3.9 MIL/uL (ref 3.87–5.11)
RDW: 13.8 % (ref 11.5–15.5)
WBC Count: 4.8 10*3/uL (ref 4.0–10.5)
nRBC: 0 % (ref 0.0–0.2)

## 2023-02-04 LAB — MAGNESIUM: Magnesium: 2 mg/dL (ref 1.7–2.4)

## 2023-02-04 LAB — LACTATE DEHYDROGENASE: LDH: 184 U/L (ref 98–192)

## 2023-02-04 NOTE — Progress Notes (Signed)
HEMATOLOGY/ONCOLOGY CLINIC NOTE  Date of Service: 02/04/23    Patient Care Team: Everlean Cherry, MD as PCP - General (Family Medicine)  CHIEF COMPLAINTS/PURPOSE OF CONSULTATION:  Follow-up for continued evaluation and management of large B-cell lymphoma status post transplant  HISTORY OF PRESENTING ILLNESS:  Please see previous notes for details on initial presentation  INTERVAL HISTORY:   Denise Walls is a 62 y.o. 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 Intrepatation provided by an Equities trader.   Patient was last seen by me on 11/05/2022 and reported left-sided back pain generally present when standing for extended periods, leg stiffness, and a 7-pound weight gain over 3 months.  She was seen by Dr. Jackquline Bosch on 01/08/2023. CT scan did not show any signs of lymphoma. CT neck showed borderline lymph nodes. It was planned to repeat the scan in a few months.  Today, she is accompanied by an interpretor. Patient reports fluctuating moods and feeling depressed sometimes. She does endorse fatigue sometimes.   Patient reports that during the time of her CT scans in August, she endorsed infection-like symptoms including sore throat, rhinorrhea, and phlegm which was difficult to spit up due to lack of energy.   Patient complains of back pain sometimes when she over exerts herself. She denies any abdominal pain.  She reports that she sometimes stays active by walking around during her grandchildrens' soccer games. Patient continues to go to church regularly.    MEDICAL HISTORY:  Past Medical History:  Diagnosis Date   Acute cystitis    Anxiety    Arthritis    Cancer (HCC)    Crohn disease (HCC)    dx 2004, history of small bowel obstruction 02/2007 treated conservatively   History of colon polyps    History of shingles    HTN (hypertension)     Hypercholesterolemia    Insomnia    Migraine    Uterine fibroid     SURGICAL HISTORY: Past Surgical History:  Procedure Laterality Date   COLONOSCOPY  09/11/2014   Small internal hemorrhoids. Otherwise normal colonoscopy to terminal ileum   COLONOSCOPY  10/02/2017   ESOPHAGOGASTRODUODENOSCOPY  04/05/2007   Mild gastritis. Otherwise, normal esophagogastroduodenoscopy   IR BONE TUMOR(S)RF ABLATION  07/31/2021   IR BONE TUMOR(S)RF ABLATION  07/31/2021   IR CT SPINE LTD  07/31/2021   IR IMAGING GUIDED PORT INSERTION  11/29/2018   IR KYPHO EA ADDL LEVEL THORACIC OR LUMBAR  07/31/2021   IR KYPHO THORACIC WITH BONE BIOPSY  07/31/2021   IR REMOVAL TUN ACCESS W/ PORT W/O FL MOD SED  02/25/2021   LYMPH NODE BIOPSY Left 12/09/2018   Procedure: LEFT DEEP CERVICAL LYMPH NODE BIOPSY;  Surgeon: Claud Kelp, MD;  Location: [facility] OR;  Service: General;  Laterality: Left;   TUBAL LIGATION      SOCIAL HISTORY: Social History   Socioeconomic History   Marital status: Married    Spouse name: Not on file   Number of children: Not on file   Years of education: Not on file   Highest education level: 6th grade  Occupational History   Not on file  Tobacco Use   Smoking status: Never   Smokeless tobacco: Never  Vaping Use   Vaping status: Never Used  Substance and Sexual Activity   Alcohol use: Never   Drug use: Never   Sexual activity: Not Currently  Birth control/protection: Post-menopausal  Other Topics Concern   Not on file  Social History Narrative   Not on file   Social Determinants of Health   Financial Resource Strain: Not on file  Food Insecurity: Low Risk  (09/09/2022)   [facility]   Hunger Vital Sign    Worried About Running Out of Food in the Last Year: Never true    Ran Out of Food in the Last Year: Never true  Transportation Needs: No Transportation Needs (09/09/2022)   [facility]   Transportation    In the  past 12 months, has lack of reliable transportation kept you from medical appointments, meetings, work or from getting things needed for daily living? : No  Physical Activity: Not on file  Stress: Not on file  Social Connections: Not on file  Intimate Partner Violence: Not on file    FAMILY HISTORY: Family History  Problem Relation Age of Onset   Heart disease Father    Arthritis Sister    Heart disease Sister    Colon cancer Neg Hx     ALLERGIES:  is allergic to nsaids.  MEDICATIONS:  Current Outpatient Medications  Medication Sig Dispense Refill   acetaminophen (TYLENOL) 325 MG tablet Take 325-650 mg by mouth every 6 (six) hours as needed for mild pain.     amLODipine (NORVASC) 10 MG tablet Take 1 tablet by mouth daily.     cholecalciferol (VITAMIN D3) 25 MCG (1000 UT) tablet Take 1 tablet (1,000 Units total) by mouth daily. 30 tablet 3   dexamethasone (DECADRON) 2 MG tablet Take 2 tablets (4 mg total) by mouth 2 (two) times daily. (Patient not taking: Reported on 08/05/2022) 20 tablet 0   mesalamine (APRISO) 0.375 g 24 hr capsule Take 4 capsules (1.5 g total) by mouth daily. 120 capsule 11   nystatin (MYCOSTATIN) 100000 UNIT/ML suspension Take 5 mLs (500,000 Units total) by mouth 4 (four) times daily. Swish in the mouth and throat and then swallow (Patient not taking: Reported on 08/05/2022) 200 mL 0   polyethylene glycol (MIRALAX / GLYCOLAX) 17 g packet Take 17 g by mouth daily as needed for mild constipation. (Patient not taking: Reported on 08/05/2022) 14 each 0   traMADol (ULTRAM) 50 MG tablet Take 50 mg by mouth every 6 (six) hours as needed for pain. (Patient not taking: Reported on 08/05/2022)     venlafaxine XR (EFFEXOR-XR) 37.5 MG 24 hr capsule 37.5 mg daily with breakfast.     No current facility-administered medications for this visit.    REVIEW OF SYSTEMS:    10 Point review of Systems was done is negative except as noted above.   PHYSICAL EXAMINATION: ECOG  PERFORMANCE STATUS: 2 - Symptomatic, <50% confined to bed  Vitals:   02/04/23 1218  BP: (!) 160/84  Pulse: 71  Resp: 18  Temp: (!) 97.3 F (36.3 C)  SpO2: 100%     Filed Weights   02/04/23 1218  Weight: 160 lb 1.6 oz (72.6 kg)   .Body mass index is 31.27 kg/m.   GENERAL:alert, in no acute distress and comfortable SKIN: no acute rashes, no significant lesions EYES: conjunctiva are pink and non-injected, sclera anicteric OROPHARYNX: MMM, no exudates, no oropharyngeal erythema or ulceration NECK: supple, no JVD LYMPH:  no palpable lymphadenopathy in the cervical, axillary or inguinal regions LUNGS: clear to auscultation b/l with normal respiratory effort HEART: regular rate & rhythm ABDOMEN:  normoactive bowel sounds ,  non tender, not distended. Extremity: no pedal edema PSYCH: alert & oriented x 3 with fluent speech NEURO: no focal motor/sensory deficits   LABORATORY DATA:  I have reviewed the data as listed  .    Latest Ref Rng & Units 02/04/2023   11:17 AM 11/05/2022   12:57 PM 08/05/2022    1:38 PM  CBC  WBC 4.0 - 10.5 K/uL 4.8  5.3  5.0   Hemoglobin 12.0 - 15.0 g/dL 16.1  09.6  04.5   Hematocrit 36.0 - 46.0 % 37.2  35.3  34.5   Platelets 150 - 400 K/uL 154  135  142     .    Latest Ref Rng & Units 02/04/2023   11:17 AM 11/05/2022   12:57 PM 08/05/2022    1:38 PM  CMP  Glucose 70 - 99 mg/dL 89  97  409   BUN 8 - 23 mg/dL 15  17  18    Creatinine 0.44 - 1.00 mg/dL 8.11  9.14  7.82   Sodium 135 - 145 mmol/L 137  139  139   Potassium 3.5 - 5.1 mmol/L 4.0  3.9  3.8   Chloride 98 - 111 mmol/L 104  104  105   CO2 22 - 32 mmol/L 27  29  27    Calcium 8.9 - 10.3 mg/dL 9.2  9.1  9.2   Total Protein 6.5 - 8.1 g/dL 7.3  6.5  7.2   Total Bilirubin 0.3 - 1.2 mg/dL 0.4  0.3  0.4   Alkaline Phos 38 - 126 U/L 79  77  98   AST 15 - 41 U/L 21  26  23    ALT 0 - 44 U/L 21  24  20     . Lab Results  Component Value Date   LDH 184 02/04/2023    SURGICAL PATHOLOGY  CASE:  [id number]  PATIENT: Dailey Arutyunyan  Surgical Pathology Report      Clinical History: History of lymphoma, post CT and US guided biopsy of  T11-T12 left paraspinal hypermetabolic soft tissue mass (jmc)    FINAL MICROSCOPIC DIAGNOSIS:   A. SOFT TISSUE MASS, T11-T12, LEFT PARASPINAL, NEEDLE CORE BIOPSY:  -  Diffuse large B-cell lymphoma, germinal center subtype  -  See comment   COMMENT:   The core biopsies shows a diffuse infiltrate of malignant appearing  cells with scant to ample eosinophilic cytoplasm and round to irregular  nuclei with prominent eosinophilic nucleoli.  The malignant cells are  infiltrating and surrounding skeletal muscle.  By immunohistochemistry,  the neoplastic cells are positive for CD20, PAX5, CD10 (variable), BCL6  and Bcl-2 but negative for mum 1, CD5, CD30, and EBV by in situ  hybridization.  The proliferative rate by Ki-67 is increased (up to  40%).  CD3 highlights background T cells.  Flow cytometry was attempted  on the sample ([id number]) but there was insufficient material for cyst.    Overall, these findings are consistent with a diffuse large B-cell  lymphoma, germinal center subtype.  FISH studies (high-grade/large  B-cell panel) are pending and will be reported in an addendum.     RADIOGRAPHIC STUDIES: I have personally reviewed the radiological images as listed and agreed with the findings in the report. No results found.  ASSESSMENT & PLAN:   62 y.o. female with:  1.  History of stage IV DLBCL, GCB subtype; double hit lymphoma -S/p 1 cycle  of R-CHOP and 5 cycles of da EPOCH-R and IT MTX x 4  for CNS prophylaxis -FISH  on 01/03/2019 and were positive for Myc and BCL2 rearrangement  02/14/2019 PET Scan Whole Body ([id number]) revealed "1. Findings favor complete metabolic response. No residual hypermetabolism within the lymph nodes of the neck, chest, abdomen or pelvis, which have all decreased in size in the interval. 2. Nonspecific  new diffuse skeletal hypermetabolism and new mild splenic hypermetabolism, favor reactive state of the marrow and reticuloendothelial system. Spleen is normal size. Continued surveillance with CT or PET-CT advised. 3. Small layering left pleural effusion, decreased. 4.  Aortic Atherosclerosis (ICD10-I70.0)."  -Completed 5 cycles of R-EPOCH on 04/04/2019, 6 total cycles of chemotherapy (1st cycle was R-CHOP) -Completed 4 cycles of IT METHOTREXATE on 04/25/2019  05/23/2019 PET/CT scan ([id number]) revealed "Small calcified jejunal mesentery nodes, compatible with treated lymphoma. Deauville category 2. No findings suspicious for active lymphoma. Prior splenic and osseous hypermetabolism is no longer evident."  -Patient has radiographic and pathology findings suggestive of lymphoma recurrence at this time.  -PET CT scan ([id number]) revealed "1. Infiltrative intensely hypermetabolic soft tissue involving the T12-L1 spine, spinal canal and left posterior paraspinal musculature as detailed, compatible with recurrent lymphoma. Deauville category 5.  #2 T12-L1 relapsed large B-cell lymphoma.  FISH panel does not demonstrate c-Myc rearrangement and is negative for Bcl-2 and BCL6.  PLAN:   -Discussed lab results on 02/04/23 in detail with patient. CBC normal, showed WBC of 4.8K, hemoglobin of 12.6, and platelets of 154K. -CT chest/abdm/pelvis scan from 1 month ago shows normal results -CT neck scan form 1 month ago showed slight enlargement of a few lymph nodes in the neck -LDH normal -CMP normal -back pain is likely related to inflammation in the joint -discussed that exploring new hobbies may help improve mood -recommend fresh fruits and vegetables in the diet -discussed that studies have shown that the more connected she feels, such as connection with purpose, family, community, or nature, the happier she would feel -patient reports that she has completed most of her post-transplant vaccinations  at this time.  -It has been a little over one year post transplant. Continue to follow with transplant team for continued management of post-transplant vaccinations -Patient has a scheduled appointment with Dr. Burman Freestone in November 2024 -patient shall return to clinic in February 2025 for continued monitoring   FOLLOW UP: RTC with Dr Candise Che with labs in 1st week of feb 2025  The total time spent in the appointment was 30 minutes* .  All of the patient's questions were answered with apparent satisfaction. The patient knows to call the clinic with any problems, questions or concerns.   Wyvonnia Lora MD MS AAHIVMS [facility] Hematology/Oncology Physician [facility]  .*Total Encounter Time as defined by the Centers for Medicare and Medicaid Services includes, in addition to the face-to-face time of a patient visit (documented in the note above) non-face-to-face time: obtaining and reviewing outside history, ordering and reviewing medications, tests or procedures, care coordination (communications with other health care professionals or caregivers) and documentation in the medical record.    I,Mitra Faeizi,acting as a Neurosurgeon for Wyvonnia Lora, MD.,have documented all relevant documentation on the behalf of Wyvonnia Lora, MD,as directed by  Wyvonnia Lora, MD while in the presence of Wyvonnia Lora, MD.  .I have reviewed the above documentation for accuracy and completeness, and I agree with the above. Johney Maine MD

## 2023-03-04 ENCOUNTER — Ambulatory Visit: Payer: Medicare HMO | Admitting: Physician Assistant

## 2023-03-04 ENCOUNTER — Encounter: Payer: Self-pay | Admitting: Physician Assistant

## 2023-03-04 VITALS — BP 136/80 | HR 70 | Ht 60.0 in | Wt 163.4 lb

## 2023-03-04 DIAGNOSIS — K5010 Crohn's disease of large intestine without complications: Secondary | ICD-10-CM | POA: Diagnosis not present

## 2023-03-04 MED ORDER — MESALAMINE 1.2 G PO TBEC: 2.4000 g | DELAYED_RELEASE_TABLET | Freq: Every day | ORAL | 11 refills | Status: DC

## 2023-03-04 NOTE — Progress Notes (Signed)
Chief Complaint: Follow up Crohn's Disease  HPI:    Denise Walls is a 62 year old Hispanic female with a past medical history as listed below including stage IV diffuse large B-cell lymphoma in 2020 status post chemo in complete remission, Crohn's disease, known to Dr. Chales Abrahams, who presents to clinic today for follow-up of Crohn's disease.    04/17/2021 office visit with Dr. Chales Abrahams.  At that time discussed likely quiescent Crohn's disease, diagnosed in 2004, history of PSBO 02/2007 managed conservatively, involving TI and right colon, in remission on Lialda.  Negative colon 08/2014 and last colon 09/2017 negative to the TI.  At that time continued on Mesalamine ER 1.2 g, 2 tabs a day and recommended follow-up in a year.  Discussed repeat colonoscopy 09/2023.    10/21/22 patient changed to Apriso 0.375 mg tabs, 4 tabs a day since her insurance would not cover Lialda anymore.    08/26/2021 PET scan showed infiltrative intensely hypermetabolic soft tissue involving the T12-L1 spine, spinal canal and left posterior paraspinal musculature compatible with recurrent lymphoma, no additional sites of metabolically active recurrent lymphoma.    02/04/2023 office visit with oncology.  Noted she was status post stem cell transplant in August 2023.  At that time felt to be doing well recommended return to clinic in February 2025.    02/04/2023 labs with her PCP showing normal CBC and CMP.    Today, patient presents to clinic accompanied by an interpreter and explains that since switching to Apriso because of insurance reasons she feels like she is bloated and gassy.  She never had this trouble before.  She would like to try switching back to the Lialda.  Does describe having very small bowel movements multiple times a day and not feeling completely emptied.    Denies fever, chills or weight loss.  Past GI procedures: -Colonoscopy 09/11/2014 ([facility]) small internal hemorrhoids.  No active Crohn's.  Normal TI. Bx-  neg. -Colonoscopy 10/02/2017-normal to TI except for internal hemorrhoids.  Biopsies-terminal ileal biopsies, right colonic biopsies and left colonic biopsies were unremarkable.  Recommended to repeat in 5 years.   -EGD 10/02/2017-gastritis, negative CLOtest   CT chest Abdo/pelvis with contrast 07/06/2020 1. No evidence of recurrent disease in the chest, abdomen or pelvis. 2. Post treatment changes in the chest and abdomen as described. 3. Subtle sclerosis at L2 unchanged since January of 2020, may also be indicative of treated disease and did not show uptake on prior PET imaging, attention on follow-up. 4. Hepatic steatosis. 5. Aortic atherosclerosis.  Past Medical History:  Diagnosis Date   Acute cystitis    Anxiety    Arthritis    Cancer (HCC)    Crohn disease (HCC)    dx 2004, history of small bowel obstruction 02/2007 treated conservatively   History of colon polyps    History of shingles    HTN (hypertension)    Hypercholesterolemia    Insomnia    Migraine    Uterine fibroid     Past Surgical History:  Procedure Laterality Date   COLONOSCOPY  09/11/2014   Small internal hemorrhoids. Otherwise normal colonoscopy to terminal ileum   COLONOSCOPY  10/02/2017   ESOPHAGOGASTRODUODENOSCOPY  04/05/2007   Mild gastritis. Otherwise, normal esophagogastroduodenoscopy   IR BONE TUMOR(S)RF ABLATION  07/31/2021   IR BONE TUMOR(S)RF ABLATION  07/31/2021   IR CT SPINE LTD  07/31/2021   IR IMAGING GUIDED PORT INSERTION  11/29/2018   IR KYPHO EA ADDL LEVEL THORACIC OR LUMBAR  07/31/2021  IR KYPHO THORACIC WITH BONE BIOPSY  07/31/2021   IR REMOVAL TUN ACCESS W/ PORT W/O FL MOD SED  02/25/2021   LYMPH NODE BIOPSY Left 12/09/2018   Procedure: LEFT DEEP CERVICAL LYMPH NODE BIOPSY;  Surgeon: Claud Kelp, MD;  Location: [facility] OR;  Service: General;  Laterality: Left;   TUBAL LIGATION      Current Outpatient Medications  Medication Sig Dispense Refill   acetaminophen (TYLENOL) 325 MG tablet  Take 325-650 mg by mouth every 6 (six) hours as needed for mild pain.     amLODipine (NORVASC) 10 MG tablet Take 1 tablet by mouth daily.     cholecalciferol (VITAMIN D3) 25 MCG (1000 UT) tablet Take 1 tablet (1,000 Units total) by mouth daily. 30 tablet 3   dexamethasone (DECADRON) 2 MG tablet Take 2 tablets (4 mg total) by mouth 2 (two) times daily. (Patient not taking: Reported on 08/05/2022) 20 tablet 0   mesalamine (APRISO) 0.375 g 24 hr capsule Take 4 capsules (1.5 g total) by mouth daily. 120 capsule 11   nystatin (MYCOSTATIN) 100000 UNIT/ML suspension Take 5 mLs (500,000 Units total) by mouth 4 (four) times daily. Swish in the mouth and throat and then swallow (Patient not taking: Reported on 08/05/2022) 200 mL 0   polyethylene glycol (MIRALAX / GLYCOLAX) 17 g packet Take 17 g by mouth daily as needed for mild constipation. (Patient not taking: Reported on 08/05/2022) 14 each 0   traMADol (ULTRAM) 50 MG tablet Take 50 mg by mouth every 6 (six) hours as needed for pain. (Patient not taking: Reported on 08/05/2022)     venlafaxine XR (EFFEXOR-XR) 37.5 MG 24 hr capsule 37.5 mg daily with breakfast.     No current facility-administered medications for this visit.    Allergies as of 03/04/2023 - Review Complete 08/05/2022  Allergen Reaction Noted   Nsaids Anaphylaxis and Other (See Comments) 10/19/2018    Family History  Problem Relation Age of Onset   Heart disease Father    Arthritis Sister    Heart disease Sister    Colon cancer Neg Hx     Social History   Socioeconomic History   Marital status: Married    Spouse name: Not on file   Number of children: Not on file   Years of education: Not on file   Highest education level: 6th grade  Occupational History   Not on file  Tobacco Use   Smoking status: Never   Smokeless tobacco: Never  Vaping Use   Vaping status: Never Used  Substance and Sexual Activity   Alcohol use: Never   Drug use: Never   Sexual activity: Not  Currently    Birth control/protection: Post-menopausal  Other Topics Concern   Not on file  Social History Narrative   Not on file   Social Determinants of Health   Financial Resource Strain: Not on file  Food Insecurity: Low Risk  (09/09/2022)   [facility]   Hunger Vital Sign    Worried About Running Out of Food in the Last Year: Never true    Ran Out of Food in the Last Year: Never true  Transportation Needs: No Transportation Needs (09/09/2022)   [facility]   Transportation    In the past 12 months, has lack of reliable transportation kept you from medical appointments, meetings, work or from getting things needed for daily living? : No  Physical Activity: Not on file  Stress: Not  on file  Social Connections: Not on file  Intimate Partner Violence: Not on file    Review of Systems:    Constitutional: No weight loss, fever or chills Cardiovascular: No chest pain Respiratory: No SOB  Gastrointestinal: See HPI and otherwise negative Genitourinary: No dysuria  Neurological: No headache, dizziness or syncope Musculoskeletal: No new muscle or joint pain Hematologic: No bleeding  Psychiatric: No history of depression or anxiety   Physical Exam:  Vital signs: BP 136/80   Pulse 70   Ht 5' (1.524 m)   Wt 163 lb 6.4 oz (74.1 kg)   LMP  (LMP Unknown)   BMI 31.91 kg/m    Constitutional:   Pleasant Hispanic female appears to be in NAD, Well developed, Well nourished, alert and cooperative Head:  Normocephalic and atraumatic. Eyes:   PEERL, EOMI. No icterus. Conjunctiva pink. Ears:  Normal auditory acuity. Neck:  Supple Throat: Oral cavity and pharynx without inflammation, swelling or lesion.  Respiratory: Respirations even and unlabored. Lungs clear to auscultation bilaterally.   No wheezes, crackles, or rhonchi.  Cardiovascular: Normal S1, S2. No MRG. Regular rate and rhythm. No peripheral edema, cyanosis or  pallor.  Gastrointestinal:  Soft, mild distention, nontender. No rebound or guarding.  Decreased bowel sounds all 4 quadrants. No appreciable masses or hepatomegaly. Rectal:  Not performed.  Msk:  Symmetrical without gross deformities. Without edema, no deformity or joint abnormality.  Neurologic:  Alert and  oriented x4;  grossly normal neurologically.  Skin:   Dry and intact without significant lesions or rashes. Psychiatric:  Demonstrates good judgement and reason without abnormal affect or behaviors.  RELEVANT LABS AND IMAGING: CBC    Component Value Date/Time   WBC 4.8 02/04/2023 1117   WBC 5.5 09/11/2021 0743   RBC 3.90 02/04/2023 1117   HGB 12.6 02/04/2023 1117   HCT 37.2 02/04/2023 1117   PLT 154 02/04/2023 1117   MCV 95.4 02/04/2023 1117   MCH 32.3 02/04/2023 1117   MCHC 33.9 02/04/2023 1117   RDW 13.8 02/04/2023 1117   LYMPHSABS 2.2 02/04/2023 1117   MONOABS 0.4 02/04/2023 1117   EOSABS 0.1 02/04/2023 1117   BASOSABS 0.0 02/04/2023 1117    CMP     Component Value Date/Time   NA 137 02/04/2023 1117   K 4.0 02/04/2023 1117   CL 104 02/04/2023 1117   CO2 27 02/04/2023 1117   GLUCOSE 89 02/04/2023 1117   BUN 15 02/04/2023 1117   CREATININE 0.90 02/04/2023 1117   CALCIUM 9.2 02/04/2023 1117   PROT 7.3 02/04/2023 1117   ALBUMIN 4.3 02/04/2023 1117   AST 21 02/04/2023 1117   ALT 21 02/04/2023 1117   ALKPHOS 79 02/04/2023 1117   BILITOT 0.4 02/04/2023 1117   GFRNONAA >60 02/04/2023 1117   GFRAA >60 01/27/2020 1005    Assessment: 1.  Crohn's disease: No complication, diagnosed in 2004, history of PSBO 02/2007 managed conservatively, involving TI and right colon, in remission on Lialda over the past few years which had to be switched to Apriso recently, negative colon 08/2014 and last colon 09/2017 negative to the TI, repeat colon recommended in 2025, patient having some increased bloating and what sounds like constipation with change to Apriso  Plan: 1.  Patient  would like [place] to try and get her Lialda covered.  We will try sending Lialda 2.4 g again.  If this is not covered by her insurance may be the fact that she is not doing well on Apriso will help  [place] get it covered for her.  Sent #[phone] with 11 refills. 2.  If insurance does not cover Lialda then would recommend she start taking MiraLAX daily for her excess bloating and small bowel movements likely related to constipation.  We discussed this today. 3.  Patient will follow-up with Dr. Chales Abrahams in 4 months.  At that time we can see where we are at with her Lialda and if she is doing okay.  Can also possibly titrate down on Apriso in the future which may be helpful.  Regardless I think she needs a sooner follow-up just to make sure things are well.  Hyacinth Meeker, PA-C [facility] 03/04/2023, 2:25 PM  Cc: Everlean Cherry, MD

## 2023-03-04 NOTE — Patient Instructions (Signed)
We have sent the following medications to your pharmacy for you to pick up at your convenience: Lialda 2.4 mg daily.   _______________________________________________________  If your blood pressure at your visit was 140/90 or greater, please contact your primary care physician to follow up on this.  _______________________________________________________  If you are age 63 or older, your body mass index should be between 23-30. Your Body mass index is 31.91 kg/m. If this is out of the aforementioned range listed, please consider follow up with your Primary Care Provider.  If you are age 52 or younger, your body mass index should be between 19-25. Your Body mass index is 31.91 kg/m. If this is out of the aformentioned range listed, please consider follow up with your Primary Care Provider.   ________________________________________________________  The [facility] providers would like to encourage you to use [facility] to communicate with providers for non-urgent requests or questions.  Due to long hold times on the telephone, sending your provider a message by [facility] may be a faster and more efficient way to get a response.  Please allow 48 business hours for a response.  Please remember that this is for non-urgent requests.  _______________________________________________________

## 2023-04-09 NOTE — Progress Notes (Signed)
Agree with assessment/plan.  Raj Florestine Carmical, MD [facility] [phone]  

## 2023-05-29 ENCOUNTER — Ambulatory Visit: Payer: Medicare HMO | Admitting: Gastroenterology

## 2023-06-23 ENCOUNTER — Other Ambulatory Visit: Payer: Self-pay

## 2023-06-23 DIAGNOSIS — C8330 Diffuse large B-cell lymphoma, unspecified site: Secondary | ICD-10-CM

## 2023-06-24 ENCOUNTER — Inpatient Hospital Stay (HOSPITAL_BASED_OUTPATIENT_CLINIC_OR_DEPARTMENT_OTHER): Payer: Medicare Other | Admitting: Hematology

## 2023-06-24 ENCOUNTER — Inpatient Hospital Stay: Payer: Medicare Other | Attending: Hematology

## 2023-06-24 VITALS — BP 131/81 | HR 79 | Temp 97.6°F | Resp 16 | Ht 58.5 in | Wt 166.6 lb

## 2023-06-24 DIAGNOSIS — C8330 Diffuse large B-cell lymphoma, unspecified site: Secondary | ICD-10-CM | POA: Insufficient documentation

## 2023-06-24 DIAGNOSIS — Z79899 Other long term (current) drug therapy: Secondary | ICD-10-CM | POA: Diagnosis not present

## 2023-06-24 LAB — CBC WITH DIFFERENTIAL (CANCER CENTER ONLY)
Abs Immature Granulocytes: 0.01 10*3/uL (ref 0.00–0.07)
Basophils Absolute: 0 10*3/uL (ref 0.0–0.1)
Basophils Relative: 1 %
Eosinophils Absolute: 0.1 10*3/uL (ref 0.0–0.5)
Eosinophils Relative: 1 %
HCT: 39.9 % (ref 36.0–46.0)
Hemoglobin: 13.3 g/dL (ref 12.0–15.0)
Immature Granulocytes: 0 %
Lymphocytes Relative: 37 %
Lymphs Abs: 1.6 10*3/uL (ref 0.7–4.0)
MCH: 32.2 pg (ref 26.0–34.0)
MCHC: 33.3 g/dL (ref 30.0–36.0)
MCV: 96.6 fL (ref 80.0–100.0)
Monocytes Absolute: 0.3 10*3/uL (ref 0.1–1.0)
Monocytes Relative: 7 %
Neutro Abs: 2.3 10*3/uL (ref 1.7–7.7)
Neutrophils Relative %: 54 %
Platelet Count: 160 10*3/uL (ref 150–400)
RBC: 4.13 MIL/uL (ref 3.87–5.11)
RDW: 13.3 % (ref 11.5–15.5)
WBC Count: 4.3 10*3/uL (ref 4.0–10.5)
nRBC: 0 % (ref 0.0–0.2)

## 2023-06-24 LAB — CMP (CANCER CENTER ONLY)
ALT: 23 U/L (ref 0–44)
AST: 25 U/L (ref 15–41)
Albumin: 4.4 g/dL (ref 3.5–5.0)
Alkaline Phosphatase: 79 U/L (ref 38–126)
Anion gap: 5 (ref 5–15)
BUN: 16 mg/dL (ref 8–23)
CO2: 31 mmol/L (ref 22–32)
Calcium: 9.4 mg/dL (ref 8.9–10.3)
Chloride: 100 mmol/L (ref 98–111)
Creatinine: 0.97 mg/dL (ref 0.44–1.00)
GFR, Estimated: >60 mL/min (ref >60)
Glucose, Bld: 75 mg/dL (ref 70–99)
Potassium: 3.7 mmol/L (ref 3.5–5.1)
Sodium: 136 mmol/L (ref 135–145)
Total Bilirubin: 0.4 mg/dL (ref 0.0–1.2)
Total Protein: 7.3 g/dL (ref 6.5–8.1)

## 2023-06-24 LAB — MAGNESIUM: Magnesium: 2.1 mg/dL (ref 1.7–2.4)

## 2023-06-24 LAB — LACTATE DEHYDROGENASE: LDH: 190 U/L (ref 98–192)

## 2023-06-24 NOTE — Progress Notes (Signed)
 HEMATOLOGY/ONCOLOGY CLINIC NOTE  Date of Service: 06/24/23    Patient Care Team: [name], MD as PCP - General (Family Medicine)  CHIEF COMPLAINTS/PURPOSE OF CONSULTATION:  Follow-up for continued evaluation and management of large B-cell lymphoma status post transplant  HISTORY OF PRESENTING ILLNESS:  Please see previous notes for details on initial presentation  INTERVAL HISTORY:   Denise Walls is a 63 y.o. 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   Patient was last seen by me on 02/04/2023 and reported fluctuating mood with depression sometimes, fatigue, and back pain with overexertion. She reported endorsing infection symptoms in August including sore throat, rhinorrhea, and phlegm which was difficult to spit up due to lack of energy.   She was seen by Dr. Renold on 04/13/2023.   Interpretation is provided by an interpreter during today's visit. Patient reports that she has been doing well overall since her last clinical visit with no new symptoms. She reports normal eating habits and plenty of energy. She denies unexplained fever, chills, night sweats, new lumps/bumps, change in breathing, abdominal pain/distention, infection issues, spine pain  Patient was noted to be 1.5 years out from her bone marrow transplant. She last received her post-transplant vaccines at [facility] in November 2024.   Her next visit with the [facility] transplant team is on 07/14/2023 for routine follow up and to receive her next set of vaccines. She will receive a repeat scan in August, which is around the 2-year mark of post-transplant status.   She reports that she has not received her shingles vaccine yet and notes that she has never had a shingles vaccine prior to her transplant. She reports that she was told that once she receives her shingles vaccine, she should  stop Acyclovir.   Patient complains of discomfort in the right sacroiliac joint, which she reports is in a different area from her bone marrow biopsy.   She does plan to travel to [place] soon to visit some relatives.   MEDICAL HISTORY:  Past Medical History:  Diagnosis Date   Acute cystitis    Anxiety    Arthritis    Cancer (HCC)    Crohn disease (HCC)    dx 2004, history of small bowel obstruction 02/2007 treated conservatively   History of colon polyps    History of shingles    HTN (hypertension)    Hypercholesterolemia    Insomnia    Migraine    Uterine fibroid     SURGICAL HISTORY: Past Surgical History:  Procedure Laterality Date   COLONOSCOPY  09/11/2014   Small internal hemorrhoids. Otherwise normal colonoscopy to terminal ileum   COLONOSCOPY  10/02/2017   ESOPHAGOGASTRODUODENOSCOPY  04/05/2007   Mild gastritis. Otherwise, normal esophagogastroduodenoscopy   IR BONE TUMOR(S)RF ABLATION  07/31/2021   IR BONE TUMOR(S)RF ABLATION  07/31/2021   IR CT SPINE LTD  07/31/2021   IR IMAGING GUIDED PORT INSERTION  11/29/2018   IR KYPHO EA ADDL LEVEL THORACIC OR LUMBAR  07/31/2021   IR KYPHO THORACIC WITH BONE BIOPSY  07/31/2021   IR REMOVAL TUN ACCESS W/ PORT W/O FL MOD SED  02/25/2021   LYMPH NODE BIOPSY Left 12/09/2018   Procedure: LEFT DEEP CERVICAL LYMPH NODE BIOPSY;  Surgeon: Gail Favorite, MD;  Location: [facility] OR;  Service: General;  Laterality: Left;   TUBAL LIGATION      SOCIAL HISTORY: Social History  Socioeconomic History   Marital status: Married    Spouse name: Not on file   Number of children: Not on file   Years of education: Not on file   Highest education level: 6th grade  Occupational History   Not on file  Tobacco Use   Smoking status: Never   Smokeless tobacco: Never  Vaping Use   Vaping status: Never Used  Substance and Sexual Activity   Alcohol use: Never   Drug use: Never   Sexual activity: Not Currently    Birth control/protection:  Post-menopausal  Other Topics Concern   Not on file  Social History Narrative   Not on file   Social Drivers of Health   Financial Resource Strain: Not on file  Food Insecurity: Low Risk  (09/09/2022)   [facility]   Hunger Vital Sign    Worried About Running Out of Food in the Last Year: Never true    Ran Out of Food in the Last Year: Never true  Transportation Needs: No Transportation Needs (09/09/2022)   [facility]   Transportation    In the past 12 months, has lack of reliable transportation kept you from medical appointments, meetings, work or from getting things needed for daily living? : No  Physical Activity: Not on file  Stress: Not on file  Social Connections: Not on file  Intimate Partner Violence: Not on file    FAMILY HISTORY: Family History  Problem Relation Age of Onset   Heart disease Father    Arthritis Sister    Heart disease Sister    Colon cancer Neg Hx     ALLERGIES:  is allergic to nsaids.  MEDICATIONS:  Current Outpatient Medications  Medication Sig Dispense Refill   acetaminophen  (TYLENOL ) 325 MG tablet Take 325-650 mg by mouth every 6 (six) hours as needed for mild pain.     acyclovir (ZOVIRAX) 800 MG tablet Take 800 mg by mouth 2 (two) times daily.     amLODipine  (NORVASC ) 10 MG tablet Take 1 tablet by mouth daily. (Patient not taking: Reported on 03/04/2023)     cholecalciferol  (VITAMIN D3) 25 MCG (1000 UT) tablet Take 1 tablet (1,000 Units total) by mouth daily. 30 tablet 3   dexamethasone  (DECADRON ) 2 MG tablet Take 2 tablets (4 mg total) by mouth 2 (two) times daily. (Patient not taking: Reported on 08/05/2022) 20 tablet 0   folic acid (FOLVITE) 1 MG tablet Take 1 mg by mouth daily.     mesalamine  (LIALDA ) 1.2 g EC tablet Take 2 tablets (2.4 g total) by mouth daily with breakfast. 60 tablet 11   nystatin  (MYCOSTATIN ) 100000 UNIT/ML suspension Take 5 mLs (500,000 Units total) by mouth  4 (four) times daily. Swish in the mouth and throat and then swallow (Patient not taking: Reported on 08/05/2022) 200 mL 0   polyethylene glycol (MIRALAX  / GLYCOLAX ) 17 g packet Take 17 g by mouth daily as needed for mild constipation. (Patient not taking: Reported on 08/05/2022) 14 each 0   traMADol  (ULTRAM ) 50 MG tablet Take 50 mg by mouth every 6 (six) hours as needed for pain. (Patient not taking: Reported on 08/05/2022)     venlafaxine  XR (EFFEXOR -XR) 37.5 MG 24 hr capsule 75 mg daily with breakfast.     No current facility-administered medications for this visit.    REVIEW OF SYSTEMS:    10 Point review of Systems was done is negative except as noted above.  PHYSICAL EXAMINATION: ECOG PERFORMANCE STATUS: 2 - Symptomatic, <50% confined to bed  Vitals:   06/24/23 1027  BP: 131/81  Pulse: 79  Resp: 16  Temp: 97.6 F (36.4 C)  SpO2: 100%   Filed Weights   06/24/23 1027  Weight: 166 lb 9.6 oz (75.6 kg)   .Body mass index is 34.23 kg/m.    GENERAL:alert, in no acute distress and comfortable SKIN: no acute rashes, no significant lesions EYES: conjunctiva are pink and non-injected, sclera anicteric OROPHARYNX: MMM, no exudates, no oropharyngeal erythema or ulceration NECK: supple, no JVD LYMPH:  no palpable lymphadenopathy in the cervical, axillary or inguinal regions LUNGS: clear to auscultation b/l with normal respiratory effort HEART: regular rate & rhythm ABDOMEN:  normoactive bowel sounds , non tender, not distended. Extremity: no pedal edema PSYCH: alert & oriented x 3 with fluent speech NEURO: no focal motor/sensory deficits   LABORATORY DATA:  I have reviewed the data as listed  .    Latest Ref Rng & Units 06/24/2023    9:49 AM 02/04/2023   11:17 AM 11/05/2022   12:57 PM  CBC  WBC 4.0 - 10.5 K/uL 4.3  4.8  5.3   Hemoglobin 12.0 - 15.0 g/dL 86.6  87.3  88.4   Hematocrit 36.0 - 46.0 % 39.9  37.2  35.3   Platelets 150 - 400 K/uL 160  154  135     .     Latest Ref Rng & Units 06/24/2023    9:49 AM 02/04/2023   11:17 AM 11/05/2022   12:57 PM  CMP  Glucose 70 - 99 mg/dL 75  89  97   BUN 8 - 23 mg/dL 16  15  17    Creatinine 0.44 - 1.00 mg/dL 9.02  9.09  9.13   Sodium 135 - 145 mmol/L 136  137  139   Potassium 3.5 - 5.1 mmol/L 3.7  4.0  3.9   Chloride 98 - 111 mmol/L 100  104  104   CO2 22 - 32 mmol/L 31  27  29    Calcium  8.9 - 10.3 mg/dL 9.4  9.2  9.1   Total Protein 6.5 - 8.1 g/dL 7.3  7.3  6.5   Total Bilirubin 0.0 - 1.2 mg/dL 0.4  0.4  0.3   Alkaline Phos 38 - 126 U/L 79  79  77   AST 15 - 41 U/L 25  21  26    ALT 0 - 44 U/L 23  21  24     . Lab Results  Component Value Date   LDH 190 06/24/2023    SURGICAL PATHOLOGY  CASE: [id number]  PATIENT: Denise Walls  Surgical Pathology Report      Clinical History: History of lymphoma, post CT and US  guided biopsy of  T11-T12 left paraspinal hypermetabolic soft tissue mass (jmc)    FINAL MICROSCOPIC DIAGNOSIS:   A. SOFT TISSUE MASS, T11-T12, LEFT PARASPINAL, NEEDLE CORE BIOPSY:  -  Diffuse large B-cell lymphoma, germinal center subtype  -  See comment   COMMENT:   The core biopsies shows a diffuse infiltrate of malignant appearing  cells with scant to ample eosinophilic cytoplasm and round to irregular  nuclei with prominent eosinophilic nucleoli.  The malignant cells are  infiltrating and surrounding skeletal muscle.  By immunohistochemistry,  the neoplastic cells are positive for CD20, PAX5, CD10 (variable), BCL6  and Bcl-2 but negative for mum 1, CD5, CD30, and EBV by in situ  hybridization.  The proliferative rate by  Ki-67 is increased (up to  40%).  CD3 highlights background T cells.  Flow cytometry was attempted  on the sample ([id number]) but there was insufficient material for cyst.    Overall, these findings are consistent with a diffuse large B-cell  lymphoma, germinal center subtype.  FISH studies (high-grade/large  B-cell panel) are pending and will be  reported in an addendum.     RADIOGRAPHIC STUDIES: I have personally reviewed the radiological images as listed and agreed with the findings in the report. No results found.  ASSESSMENT & PLAN:   63 y.o. female with:  1.  History of stage IV DLBCL, GCB subtype; double hit lymphoma -S/p 1 cycle  of R-CHOP and 5 cycles of da EPOCH-R and IT MTX x 4 for CNS prophylaxis -FISH  on 01/03/2019 and were positive for Myc and BCL2 rearrangement  02/14/2019 PET Scan Whole Body ([id number]) revealed 1. Findings favor complete metabolic response. No residual hypermetabolism within the lymph nodes of the neck, chest, abdomen or pelvis, which have all decreased in size in the interval. 2. Nonspecific new diffuse skeletal hypermetabolism and new mild splenic hypermetabolism, favor reactive state of the marrow and reticuloendothelial system. Spleen is normal size. Continued surveillance with CT or PET-CT advised. 3. Small layering left pleural effusion, decreased. 4.  Aortic Atherosclerosis (ICD10-I70.0).  -Completed 5 cycles of R-EPOCH on 04/04/2019, 6 total cycles of chemotherapy (1st cycle was R-CHOP) -Completed 4 cycles of IT METHOTREXATE  on 04/25/2019  05/23/2019 PET/CT scan ([id number]) revealed Small calcified jejunal mesentery nodes, compatible with treated lymphoma. Deauville category 2. No findings suspicious for active lymphoma. Prior splenic and osseous hypermetabolism is no longer evident.  -Patient has radiographic and pathology findings suggestive of lymphoma recurrence at this time.  -PET CT scan ([id number]) revealed 1. Infiltrative intensely hypermetabolic soft tissue involving the T12-L1 spine, spinal canal and left posterior paraspinal musculature as detailed, compatible with recurrent lymphoma. Deauville category 5.  #2 T12-L1 relapsed large B-cell lymphoma.  FISH panel does not demonstrate c-Myc rearrangement and is negative for Bcl-2 and BCL6.  PLAN:   -Discussed lab results  on 06/24/23 in detail with patient. CBC normal, showed WBC of 4.3K, hemoglobin of 13.3, and platelets of 160K. -CMP normal -CT neck scan from 04/13/2023 showed stable findings -did not feel any enlarged lymph nodes, enlarged liver, or enlarged spleen during physical examination -okay for patient to receive the shingles vaccine given that she is over 1 year out from her bone marrow transplant -advised patient to connect with her transplant team to discuss whether there are plans for a shingles vaccine. If there is no plan for shingles vaccine in the next month, it would be okay to receive shingles vaccine at a pharmacy.  -we shall continue to alternate visits with [facility] transplant team -she has a scheduled appt with her transplant team on 07/14/2023 -she shall return to clinic with us  in 3-4 months with labs -discussed that after the first 2 years, there generally may not be a role to follow up with her transplant team as frequently -patient appears to have inflammation in the right sacroiliac joint of the lower back -Discussed that there may be a role to connect orthopedics to consider steroids to improve lower back pain -recommend resting the lower back area and using an SI brace for 6-8 weeks to hold the pelvis in place and relax the joint -also recommend using OTC Voltaren gel 2-3 times day for 1 week -Advised patient to avoid lifting heavy objects -answered  all of patient's questions in detail   FOLLOW UP: RTC with Dr Onesimo with labs in 4 months  The total time spent in the appointment was 30 minutes* .  All of the patient's questions were answered with apparent satisfaction. The patient knows to call the clinic with any problems, questions or concerns.   Emaline Onesimo MD MS AAHIVMS [facility] Hematology/Oncology Physician [facility]  .*Total Encounter Time as defined by the Centers for Medicare and Medicaid Services includes, in addition to the face-to-face time of a  patient visit (documented in the note above) non-face-to-face time: obtaining and reviewing outside history, ordering and reviewing medications, tests or procedures, care coordination (communications with other health care professionals or caregivers) and documentation in the medical record.    I,Mitra Faeizi,acting as a neurosurgeon for Emaline Onesimo, MD.,have documented all relevant documentation on the behalf of Emaline Onesimo, MD,as directed by  Emaline Onesimo, MD while in the presence of Emaline Onesimo, MD.  .I have reviewed the above documentation for accuracy and completeness, and I agree with the above. .Markies Mowatt Kishore Jeany Seville MD

## 2023-07-16 ENCOUNTER — Ambulatory Visit: Payer: Medicare HMO | Admitting: Gastroenterology

## 2023-08-18 ENCOUNTER — Ambulatory Visit: Payer: Medicare Other | Admitting: Gastroenterology

## 2023-08-18 ENCOUNTER — Encounter: Payer: Self-pay | Admitting: Gastroenterology

## 2023-08-18 VITALS — BP 122/80 | HR 74 | Ht <= 58 in | Wt 161.0 lb

## 2023-08-18 DIAGNOSIS — K5080 Crohn's disease of both small and large intestine without complications: Secondary | ICD-10-CM

## 2023-08-18 DIAGNOSIS — K5010 Crohn's disease of large intestine without complications: Secondary | ICD-10-CM

## 2023-08-18 MED ORDER — MESALAMINE 1.2 G PO TBEC: 2.4000 g | DELAYED_RELEASE_TABLET | Freq: Two times a day (BID) | ORAL | 3 refills | Status: AC

## 2023-08-18 NOTE — Patient Instructions (Addendum)
 _______________________________________________________  If your blood pressure at your visit was 140/90 or greater, please contact your primary care physician to follow up on this.  _______________________________________________________  If you are age 64 or older, your body mass index should be between 23-30. Your Body mass index is 33.65 kg/m. If this is out of the aforementioned range listed, please consider follow up with your Primary Care Provider.  If you are age 30 or younger, your body mass index should be between 19-25. Your Body mass index is 33.65 kg/m. If this is out of the aformentioned range listed, please consider follow up with your Primary Care Provider.   ________________________________________________________  The [place] [facility] providers would like to encourage you to use [facility] to communicate with providers for non-urgent requests or questions.  Due to long hold times on the telephone, sending your provider a message by [facility] may be a faster and more efficient way to get a response.  Please allow 48 business hours for a response.  Please remember that this is for non-urgent requests.  _______________________________________________________   [employer] siguientes medicamentos a su farmacia para que los recoja cuando le resulte conveniente: Lialda 2 comprimidos 2 veces al da  We have sent the following medications to your pharmacy for you to pick up at your convenience: Lialda 2 tablets 2 times a day  Por favor, haga un seguimiento en 12 meses. Llmenos al [phone] para programar una cita.  Please follow up in 12 months. Give [place] a call at [phone] to schedule an appointment.  Thank you,  Dr. Lynann Bologna

## 2023-08-18 NOTE — Progress Notes (Signed)
 Chief Complaint: Follow up Crohn's Disease   HPI:    Mrs. 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 Chales Abrahams, who presents to clinic today for follow-up of Crohn's disease.    04/17/2021 office visit with Dr. Chales Abrahams.  At that time discussed likely quiescent Crohn's disease, diagnosed in 2004, history of PSBO 02/2007 managed conservatively, involving TI and right colon, in remission on Lialda.  Negative colon 08/2014 and last colon 09/2017 negative to the TI.  At that time continued on Mesalamine ER 1.2 g, 2 tabs a day and recommended follow-up in a year.  Discussed repeat colonoscopy 09/2023.    10/21/22 patient changed to Apriso 0.375 mg tabs, 4 tabs a day since her insurance would not cover Lialda anymore.    08/26/2021 PET scan showed infiltrative intensely hypermetabolic soft tissue involving the T12-L1 spine, spinal canal and left posterior paraspinal musculature compatible with recurrent lymphoma, no additional sites of metabolically active recurrent lymphoma.    02/04/2023 office visit with oncology.  Noted she was status post stem cell transplant in August 2023.  At that time felt to be doing well recommended return to clinic in February 2025.    02/04/2023 labs with her PCP showing normal CBC and CMP.    Today, patient presents to clinic accompanied by an interpreter and explains that since switching to Apriso because of insurance reasons she feels like she is bloated and gassy.  She never had this trouble before.  She would like to try switching back to the Lialda.  Does describe having very small bowel movements multiple times a day and not feeling completely emptied.    Denies fever, chills or weight loss.   Past GI procedures: -Colonoscopy 09/11/2014 ([facility]) small internal hemorrhoids.  No active Crohn's.  Normal TI. Bx-  neg. -Colonoscopy 10/02/2017-normal to TI except for internal hemorrhoids.  Biopsies-terminal ileal biopsies, right colonic biopsies and left colonic biopsies were unremarkable.  Recommended to repeat in 5 years.   -EGD 10/02/2017-gastritis, negative CLOtest   CT chest Abdo/pelvis with contrast 07/06/2020 1. No evidence of recurrent disease in the chest, abdomen or pelvis. 2. Post treatment changes in the chest and abdomen as described. 3. Subtle sclerosis at L2 unchanged since January of 2020, may also be indicative of treated disease and did not show uptake on prior PET imaging, attention on follow-up. 4. Hepatic steatosis. 5. Aortic atherosclerosis.       Past Medical History:  Diagnosis Date   Acute cystitis     Anxiety     Arthritis     Cancer (HCC)     Crohn disease (HCC)      dx 2004, history of small bowel obstruction 02/2007 treated conservatively   History of colon polyps     History of shingles     HTN (hypertension)     Hypercholesterolemia     Insomnia     Migraine     Uterine fibroid                 Past Surgical History:  Procedure Laterality Date   COLONOSCOPY   09/11/2014    Small internal hemorrhoids. Otherwise normal colonoscopy to terminal ileum   COLONOSCOPY   10/02/2017   ESOPHAGOGASTRODUODENOSCOPY   04/05/2007    Mild gastritis. Otherwise, normal esophagogastroduodenoscopy   IR BONE TUMOR(S)RF ABLATION   07/31/2021  IR BONE TUMOR(S)RF ABLATION   07/31/2021   IR CT SPINE LTD   07/31/2021   IR IMAGING GUIDED PORT INSERTION   11/29/2018   IR KYPHO EA ADDL LEVEL THORACIC OR LUMBAR   07/31/2021   IR KYPHO THORACIC WITH BONE BIOPSY   07/31/2021   IR REMOVAL TUN ACCESS W/ PORT W/O FL MOD SED   02/25/2021   LYMPH NODE BIOPSY Left 12/09/2018    Procedure: LEFT DEEP CERVICAL LYMPH NODE BIOPSY;  Surgeon: Claud Kelp, MD;  Location: [facility] OR;  Service: General;  Laterality: Left;   TUBAL LIGATION                    Current Outpatient Medications   Medication Sig Dispense Refill   acetaminophen (TYLENOL) 325 MG tablet Take 325-650 mg by mouth every 6 (six) hours as needed for mild pain.       amLODipine (NORVASC) 10 MG tablet Take 1 tablet by mouth daily.       cholecalciferol (VITAMIN D3) 25 MCG (1000 UT) tablet Take 1 tablet (1,000 Units total) by mouth daily. 30 tablet 3   dexamethasone (DECADRON) 2 MG tablet Take 2 tablets (4 mg total) by mouth 2 (two) times daily. (Patient not taking: Reported on 08/05/2022) 20 tablet 0   mesalamine (APRISO) 0.375 g 24 hr capsule Take 4 capsules (1.5 g total) by mouth daily. 120 capsule 11   nystatin (MYCOSTATIN) 100000 UNIT/ML suspension Take 5 mLs (500,000 Units total) by mouth 4 (four) times daily. Swish in the mouth and throat and then swallow (Patient not taking: Reported on 08/05/2022) 200 mL 0   polyethylene glycol (MIRALAX / GLYCOLAX) 17 g packet Take 17 g by mouth daily as needed for mild constipation. (Patient not taking: Reported on 08/05/2022) 14 each 0   traMADol (ULTRAM) 50 MG tablet Take 50 mg by mouth every 6 (six) hours as needed for pain. (Patient not taking: Reported on 08/05/2022)       venlafaxine XR (EFFEXOR-XR) 37.5 MG 24 hr capsule 37.5 mg daily with breakfast.          No current facility-administered medications for this visit.             Allergies as of 03/04/2023 - Review Complete 08/05/2022  Allergen Reaction Noted   Nsaids Anaphylaxis and Other (See Comments) 10/19/2018           Family History  Problem Relation Age of Onset   Heart disease Father     Arthritis Sister     Heart disease Sister     Colon cancer Neg Hx            Social History         Socioeconomic History   Marital status: Married      Spouse name: Not on file   Number of children: Not on file   Years of education: Not on file   Highest education level: 6th grade  Occupational History   Not on file  Tobacco Use   Smoking status: Never   Smokeless tobacco: Never  Vaping Use    Vaping status: Never Used  Substance and Sexual Activity   Alcohol use: Never   Drug use: Never   Sexual activity: Not Currently      Birth control/protection: Post-menopausal  Other Topics Concern   Not on file  Social History Narrative   Not on file    Social Determinants of Health  Financial Resource Strain: Not on file  Food Insecurity: Low Risk  (09/09/2022)    [facility]    Hunger Vital Sign     Worried About Running Out of Food in the Last Year: Never true     Ran Out of Food in the Last Year: Never true  Transportation Needs: No Transportation Needs (09/09/2022)    [facility]    Transportation     In the past 12 months, has lack of reliable transportation kept you from medical appointments, meetings, work or from getting things needed for daily living? : No  Physical Activity: Not on file  Stress: Not on file  Social Connections: Not on file  Intimate Partner Violence: Not on file      Review of Systems:    Constitutional: No weight loss, fever or chills Cardiovascular: No chest pain Respiratory: No SOB  Gastrointestinal: See HPI and otherwise negative Genitourinary: No dysuria  Neurological: No headache, dizziness or syncope Musculoskeletal: No new muscle or joint pain Hematologic: No bleeding  Psychiatric: No history of depression or anxiety    Physical Exam:  Vital signs: BP 136/80   Pulse 70   Ht 5' (1.524 m)   Wt 163 lb 6.4 oz (74.1 kg)   LMP  (LMP Unknown)   BMI 31.91 kg/m     Constitutional:   Pleasant Hispanic female appears to be in NAD, Well developed, Well nourished, alert and cooperative Head:  Normocephalic and atraumatic. Eyes:   PEERL, EOMI. No icterus. Conjunctiva pink. Ears:  Normal auditory acuity. Neck:  Supple Throat: Oral cavity and pharynx without inflammation, swelling or lesion.  Respiratory: Respirations even and unlabored. Lungs clear to auscultation  bilaterally.   No wheezes, crackles, or rhonchi.  Cardiovascular: Normal S1, S2. No MRG. Regular rate and rhythm. No peripheral edema, cyanosis or pallor.  Gastrointestinal:  Soft, mild distention, nontender. No rebound or guarding.  Decreased bowel sounds all 4 quadrants. No appreciable masses or hepatomegaly. Rectal:  Not performed.  Msk:  Symmetrical without gross deformities. Without edema, no deformity or joint abnormality.  Neurologic:  Alert and  oriented x4;  grossly normal neurologically.  Skin:   Dry and intact without significant lesions or rashes. Psychiatric:  Demonstrates good judgement and reason without abnormal affect or behaviors.   RELEVANT LABS AND IMAGING: CBC Labs (Brief)          Component Value Date/Time    WBC 4.8 02/04/2023 1117    WBC 5.5 09/11/2021 0743    RBC 3.90 02/04/2023 1117    HGB 12.6 02/04/2023 1117    HCT 37.2 02/04/2023 1117    PLT 154 02/04/2023 1117    MCV 95.4 02/04/2023 1117    MCH 32.3 02/04/2023 1117    MCHC 33.9 02/04/2023 1117    RDW 13.8 02/04/2023 1117    LYMPHSABS 2.2 02/04/2023 1117    MONOABS 0.4 02/04/2023 1117    EOSABS 0.1 02/04/2023 1117    BASOSABS 0.0 02/04/2023 1117        CMP     Labs (Brief)          Component Value Date/Time    NA 137 02/04/2023 1117    K 4.0 02/04/2023 1117    CL 104 02/04/2023 1117    CO2 27 02/04/2023 1117    GLUCOSE 89 02/04/2023 1117    BUN 15 02/04/2023 1117    CREATININE 0.90 02/04/2023 1117    CALCIUM 9.2 02/04/2023  1117    PROT 7.3 02/04/2023 1117    ALBUMIN 4.3 02/04/2023 1117    AST 21 02/04/2023 1117    ALT 21 02/04/2023 1117    ALKPHOS 79 02/04/2023 1117    BILITOT 0.4 02/04/2023 1117    GFRNONAA >60 02/04/2023 1117    GFRAA >60 01/27/2020 1005        Assessment: 1.  Crohn's disease: No complication, diagnosed in 2004, history of PSBO 02/2007 managed conservatively, involving TI and right colon, in remission on Lialda over the past few years which had to be switched to  Apriso recently, negative colon 08/2014 and last colon 09/2017 negative to the TI, repeat colon recommended in 2025, patient having some increased bloating and what sounds like constipation with change to Apriso   Plan: 1.  Patient would like [place] to try and get her Lialda covered.  We will try sending Lialda 2.4 g again.  If this is not covered by her insurance may be the fact that she is not doing well on Apriso will help [place] get it covered for her.  Sent #60 with 11 refills. 2.  If insurance does not cover Lialda then would recommend she start taking MiraLAX daily for her excess bloating and small bowel movements likely related to constipation.  We discussed this today. 3.  Patient will follow-up with Dr. Chales Abrahams in 4 months.  At that time we can see where we are at with her Lialda and if she is doing okay.  Can also possibly titrate down on Apriso in the future which may be helpful.  Regardless I think she needs a sooner follow-up just to make sure things are well.   Hyacinth Meeker, PA-C [facility]    Attending physician's note   I have taken history, reviewed the chart and examined the patient. I performed a substantive portion of this encounter, including complete performance of at least one of the key components, in conjunction with the APP. I agree with the Advanced Practitioner's note, impression and recommendations.   Doing great. No nausea, vomiting, heartburn, regurgitation, odynophagia or dysphagia.  No significant diarrhea or constipation.  No melena or hematochezia. No unintentional weight loss. No abdominal pain.  Discussed the use of AI scribe software for clinical note transcription with the patient, who gave verbal consent to proceed.  History of Present Illness       IMP: Quiescent Crohn's disease  Plan: Lialda 1.2/tab take 2 BID  FU in 12 months At FU, consider colon.     Edman Circle, MD [facility] [phone]

## 2023-09-29 ENCOUNTER — Encounter: Payer: Self-pay | Admitting: Gastroenterology

## 2023-10-21 NOTE — Progress Notes (Signed)
 HEMATOLOGY/ONCOLOGY CLINIC NOTE  Date of Service: 10/23/2023   Patient Care Team: Karena Ota, MD as PCP - General (Family Medicine)  CHIEF COMPLAINTS/PURPOSE OF CONSULTATION:  Follow-up for continued evaluation and management of large B-cell lymphoma status post transplant  HISTORY OF PRESENTING ILLNESS:  Please see previous notes for details on initial presentation  INTERVAL HISTORY:   Denise Walls is a 63 y.o. 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   Patient was last seen by me on 06/24/2023 and complained of discomfort in the right sacroiliac joint.   She reports that she feels mildly tired but is still able to move around.   She reports that her transplant team told her that she would have repeat imaging around the 2-year mark post-transplant in August.  Patient will receive her next post-transplant vaccines including MMR and shingles vaccines.   Patient denies any fever, chills, night sweats, infection issues, new SOB/chest pain, new back pain, new major abdominal pain, leg swelling change in bowel habits, or change in urinary habits.  She continues to stay UTD with her age-appropriate cancer screenings.   MEDICAL HISTORY:  Past Medical History:  Diagnosis Date   Acute cystitis    Anxiety    Arthritis    Cancer (HCC)    Crohn disease (HCC)    dx 2004, history of small bowel obstruction 02/2007 treated conservatively   History of colon polyps    History of shingles    HTN (hypertension)    Hypercholesterolemia    Insomnia    Migraine    Non-Hodgkin lymphoma (HCC)    Uterine fibroid     SURGICAL HISTORY: Past Surgical History:  Procedure Laterality Date   COLONOSCOPY  09/11/2014   Small internal hemorrhoids. Otherwise normal colonoscopy to terminal ileum   COLONOSCOPY  10/02/2017   ESOPHAGOGASTRODUODENOSCOPY  04/05/2007   Mild  gastritis. Otherwise, normal esophagogastroduodenoscopy   IR BONE TUMOR(S)RF ABLATION  07/31/2021   IR BONE TUMOR(S)RF ABLATION  07/31/2021   IR CT SPINE LTD  07/31/2021   IR IMAGING GUIDED PORT INSERTION  11/29/2018   IR KYPHO EA ADDL LEVEL THORACIC OR LUMBAR  07/31/2021   IR KYPHO THORACIC WITH BONE BIOPSY  07/31/2021   IR REMOVAL TUN ACCESS W/ PORT W/O FL MOD SED  02/25/2021   LYMPH NODE BIOPSY Left 12/09/2018   Procedure: LEFT DEEP CERVICAL LYMPH NODE BIOPSY;  Surgeon: Boyce Byes, MD;  Location: [facility] OR;  Service: General;  Laterality: Left;   TUBAL LIGATION      SOCIAL HISTORY: Social History   Socioeconomic History   Marital status: Married    Spouse name: Not on file   Number of children: Not on file   Years of education: Not on file   Highest education level: 6th grade  Occupational History   Not on file  Tobacco Use   Smoking status: Never   Smokeless tobacco: Never  Vaping Use   Vaping status: Never Used  Substance and Sexual Activity   Alcohol use: Never   Drug use: Never   Sexual activity: Not Currently    Birth control/protection: Post-menopausal  Other Topics Concern   Not on file  Social History Narrative   Not on file   Social Drivers of Health   Financial Resource Strain: Not on file  Food Insecurity: Low Risk  (09/09/2022)   Received from [facility]   Hunger Vital  Sign    Within the past 12 months, you worried that your food would run out before you got money to buy more: Never true    Within the past 12 months, the food you bought just didn't last and you didn't have money to get more. : Never true  Transportation Needs: No Transportation Needs (09/09/2022)   Received from [facility]    In the past 12 months, has lack of reliable transportation kept you from medical appointments, meetings, work or from getting things needed for daily living? : No  Physical Activity: Not on file  Stress: Not on file  Social Connections: Not on  file  Intimate Partner Violence: Not on file    FAMILY HISTORY: Family History  Problem Relation Age of Onset   Heart disease Father    Arthritis Sister    Heart disease Sister    Colon cancer Neg Hx     ALLERGIES:  is allergic to nsaids.  MEDICATIONS:  Current Outpatient Medications  Medication Sig Dispense Refill   acetaminophen  (TYLENOL ) 325 MG tablet Take 325-650 mg by mouth every 6 (six) hours as needed for mild pain.     acyclovir (ZOVIRAX) 800 MG tablet Take 800 mg by mouth 2 (two) times daily.     amLODipine  (NORVASC ) 10 MG tablet Take 1 tablet by mouth daily.     cholecalciferol  (VITAMIN D3) 25 MCG (1000 UT) tablet Take 1 tablet (1,000 Units total) by mouth daily. 30 tablet 3   dexamethasone  (DECADRON ) 2 MG tablet Take 2 tablets (4 mg total) by mouth 2 (two) times daily. 20 tablet 0   folic acid (FOLVITE) 1 MG tablet Take 1 mg by mouth daily.     mesalamine  (LIALDA ) 1.2 g EC tablet Take 2 tablets (2.4 g total) by mouth 2 (two) times daily. 360 tablet 3   nystatin  (MYCOSTATIN ) 100000 UNIT/ML suspension Take 5 mLs (500,000 Units total) by mouth 4 (four) times daily. Swish in the mouth and throat and then swallow 200 mL 0   polyethylene glycol (MIRALAX  / GLYCOLAX ) 17 g packet Take 17 g by mouth daily as needed for mild constipation. 14 each 0   traMADol  (ULTRAM ) 50 MG tablet Take 50 mg by mouth every 6 (six) hours as needed for pain.     triamterene-hydrochlorothiazide (MAXZIDE-25) 37.5-25 MG tablet Take 0.5 tablets by mouth daily.     venlafaxine  XR (EFFEXOR -XR) 37.5 MG 24 hr capsule 75 mg daily with breakfast.     No current facility-administered medications for this visit.    REVIEW OF SYSTEMS:    10 Point review of Systems was done is negative except as noted above.   PHYSICAL EXAMINATION: ECOG PERFORMANCE STATUS: 2 - Symptomatic, <50% confined to bed  Vitals:   10/23/23 1004  BP: (!) 144/74  Pulse: 74  Resp: 18  Temp: 97.6 F (36.4 C)  SpO2: 97%    Filed  Weights   10/23/23 1004  Weight: 164 lb 14.4 oz (74.8 kg)    .Body mass index is 34.46 kg/m.    GENERAL:alert, in no acute distress and comfortable SKIN: no acute rashes, no significant lesions EYES: conjunctiva are pink and non-injected, sclera anicteric OROPHARYNX: MMM, no exudates, no oropharyngeal erythema or ulceration NECK: supple, no JVD LYMPH:  no palpable lymphadenopathy in the cervical, axillary or inguinal regions LUNGS: clear to auscultation b/l with normal respiratory effort HEART: regular rate & rhythm ABDOMEN:  normoactive bowel sounds , non tender, not distended. Extremity:  no pedal edema PSYCH: alert & oriented x 3 with fluent speech NEURO: no focal motor/sensory deficits   LABORATORY DATA:  I have reviewed the data as listed  .    Latest Ref Rng & Units 10/23/2023    9:49 AM 06/24/2023    9:49 AM 02/04/2023   11:17 AM  CBC  WBC 4.0 - 10.5 K/uL 4.1  4.3  4.8   Hemoglobin 12.0 - 15.0 g/dL 16.1  09.6  04.5   Hematocrit 36.0 - 46.0 % 37.4  39.9  37.2   Platelets 150 - 400 K/uL 162  160  154     .    Latest Ref Rng & Units 10/23/2023    9:49 AM 06/24/2023    9:49 AM 02/04/2023   11:17 AM  CMP  Glucose 70 - 99 mg/dL 409  75  89   BUN 8 - 23 mg/dL 16  16  15    Creatinine 0.44 - 1.00 mg/dL 8.11  9.14  7.82   Sodium 135 - 145 mmol/L 136  136  137   Potassium 3.5 - 5.1 mmol/L 3.7  3.7  4.0   Chloride 98 - 111 mmol/L 101  100  104   CO2 22 - 32 mmol/L 30  31  27    Calcium  8.9 - 10.3 mg/dL 9.1  9.4  9.2   Total Protein 6.5 - 8.1 g/dL 7.0  7.3  7.3   Total Bilirubin 0.0 - 1.2 mg/dL 0.4  0.4  0.4   Alkaline Phos 38 - 126 U/L 73  79  79   AST 15 - 41 U/L 23  25  21    ALT 0 - 44 U/L 22  23  21     . Lab Results  Component Value Date   LDH 173 10/23/2023    SURGICAL PATHOLOGY  CASE: [id number]  PATIENT: Margery Haynesworth  Surgical Pathology Report      Clinical History: History of lymphoma, post CT and US  guided biopsy of  T11-T12 left paraspinal  hypermetabolic soft tissue mass (jmc)    FINAL MICROSCOPIC DIAGNOSIS:   A. SOFT TISSUE MASS, T11-T12, LEFT PARASPINAL, NEEDLE CORE BIOPSY:  -  Diffuse large B-cell lymphoma, germinal center subtype  -  See comment   COMMENT:   The core biopsies shows a diffuse infiltrate of malignant appearing  cells with scant to ample eosinophilic cytoplasm and round to irregular  nuclei with prominent eosinophilic nucleoli.  The malignant cells are  infiltrating and surrounding skeletal muscle.  By immunohistochemistry,  the neoplastic cells are positive for CD20, PAX5, CD10 (variable), BCL6  and Bcl-2 but negative for mum 1, CD5, CD30, and EBV by in situ  hybridization.  The proliferative rate by Ki-67 is increased (up to  40%).  CD3 highlights background T cells.  Flow cytometry was attempted  on the sample ([id number]) but there was insufficient material for cyst.    Overall, these findings are consistent with a diffuse large B-cell  lymphoma, germinal center subtype.  FISH studies (high-grade/large  B-cell panel) are pending and will be reported in an addendum.     RADIOGRAPHIC STUDIES: I have personally reviewed the radiological images as listed and agreed with the findings in the report. No results found.  ASSESSMENT & PLAN:   63 y.o. female with:  1.  History of stage IV DLBCL, GCB subtype; double hit lymphoma -S/p 1 cycle  of R-CHOP and 5 cycles of da EPOCH-R and IT MTX x 4 for CNS prophylaxis -  FISH  on 01/03/2019 and were positive for Myc and BCL2 rearrangement  02/14/2019 PET Scan Whole Body ([id number]) revealed 1. Findings favor complete metabolic response. No residual hypermetabolism within the lymph nodes of the neck, chest, abdomen or pelvis, which have all decreased in size in the interval. 2. Nonspecific new diffuse skeletal hypermetabolism and new mild splenic hypermetabolism, favor reactive state of the marrow and reticuloendothelial system. Spleen is normal size.  Continued surveillance with CT or PET-CT advised. 3. Small layering left pleural effusion, decreased. 4.  Aortic Atherosclerosis (ICD10-I70.0).  -Completed 5 cycles of R-EPOCH on 04/04/2019, 6 total cycles of chemotherapy (1st cycle was R-CHOP) -Completed 4 cycles of IT METHOTREXATE  on 04/25/2019  05/23/2019 PET/CT scan ([id number]) revealed Small calcified jejunal mesentery nodes, compatible with treated lymphoma. Deauville category 2. No findings suspicious for active lymphoma. Prior splenic and osseous hypermetabolism is no longer evident.  -Patient has radiographic and pathology findings suggestive of lymphoma recurrence at this time.  -PET CT scan ([id number]) revealed 1. Infiltrative intensely hypermetabolic soft tissue involving the T12-L1 spine, spinal canal and left posterior paraspinal musculature as detailed, compatible with recurrent lymphoma. Deauville category 5.  #2 T12-L1 relapsed large B-cell lymphoma.  FISH panel does not demonstrate c-Myc rearrangement and is negative for Bcl-2 and BCL6.  PLAN:   -Discussed lab results on 10/23/2023 in detail with patient. CBC showed WBC of 4.1K, hemoglobin of 12.7, and platelets of 162K. -CBC normal -CMP stable and LDH wnl -did not feel any enlarged lymph nodes during physical examination -there is no clinical sign or lab evidence suggestive of lymphoma recurrence at this time -patient is nearly 2 years out from her hematopoietic stem cell transplant in August 2023 -patient will receive scan in August around 2 year mark post-transplant -recommend staying UTD with her age-appropriate cancer screenings, including mammogram and colonoscopies with PCP  -patient is okay to proceed with colonoscopy from oncologic standpoint if there is a need for one -continue to stay physically active -patient shall return to clinic in 4 months then we shall extend visits to 6 month follow ups for the next two years  -continue to follow with [facility]  transplant team as scheduled   FOLLOW UP: RTC with Dr Salomon Cree with labs in 4 months  The total time spent in the appointment was 25 minutes* .  All of the patient's questions were answered with apparent satisfaction. The patient knows to call the clinic with any problems, questions or concerns.   Jacquelyn Matt MD MS AAHIVMS [facility] Hematology/Oncology Physician [facility]  .*Total Encounter Time as defined by the Centers for Medicare and Medicaid Services includes, in addition to the face-to-face time of a patient visit (documented in the note above) non-face-to-face time: obtaining and reviewing outside history, ordering and reviewing medications, tests or procedures, care coordination (communications with other health care professionals or caregivers) and documentation in the medical record.    I,Mitra Faeizi,acting as a Neurosurgeon for Jacquelyn Matt, MD.,have documented all relevant documentation on the behalf of Jacquelyn Matt, MD,as directed by  Jacquelyn Matt, MD while in the presence of Jacquelyn Matt, MD.  .I have reviewed the above documentation for accuracy and completeness, and I agree with the above. .Shafter Jupin Kishore Ramey Ketcherside MD

## 2023-10-22 ENCOUNTER — Other Ambulatory Visit: Payer: Self-pay

## 2023-10-22 DIAGNOSIS — C8330 Diffuse large B-cell lymphoma, unspecified site: Secondary | ICD-10-CM

## 2023-10-23 ENCOUNTER — Inpatient Hospital Stay: Payer: Medicare Other | Attending: Hematology

## 2023-10-23 ENCOUNTER — Inpatient Hospital Stay (HOSPITAL_BASED_OUTPATIENT_CLINIC_OR_DEPARTMENT_OTHER): Payer: Medicare Other | Admitting: Hematology

## 2023-10-23 VITALS — BP 144/74 | HR 74 | Temp 97.6°F | Resp 18 | Wt 164.9 lb

## 2023-10-23 DIAGNOSIS — Z79899 Other long term (current) drug therapy: Secondary | ICD-10-CM | POA: Diagnosis not present

## 2023-10-23 DIAGNOSIS — C8330 Diffuse large B-cell lymphoma, unspecified site: Secondary | ICD-10-CM | POA: Insufficient documentation

## 2023-10-23 DIAGNOSIS — Z9484 Stem cells transplant status: Secondary | ICD-10-CM | POA: Insufficient documentation

## 2023-10-23 LAB — CBC WITH DIFFERENTIAL (CANCER CENTER ONLY)
Abs Immature Granulocytes: 0 10*3/uL (ref 0.00–0.07)
Basophils Absolute: 0 10*3/uL (ref 0.0–0.1)
Basophils Relative: 1 %
Eosinophils Absolute: 0 10*3/uL (ref 0.0–0.5)
Eosinophils Relative: 1 %
HCT: 37.4 % (ref 36.0–46.0)
Hemoglobin: 12.7 g/dL (ref 12.0–15.0)
Immature Granulocytes: 0 %
Lymphocytes Relative: 42 %
Lymphs Abs: 1.7 10*3/uL (ref 0.7–4.0)
MCH: 32.1 pg (ref 26.0–34.0)
MCHC: 34 g/dL (ref 30.0–36.0)
MCV: 94.4 fL (ref 80.0–100.0)
Monocytes Absolute: 0.3 10*3/uL (ref 0.1–1.0)
Monocytes Relative: 6 %
Neutro Abs: 2.1 10*3/uL (ref 1.7–7.7)
Neutrophils Relative %: 50 %
Platelet Count: 162 10*3/uL (ref 150–400)
RBC: 3.96 MIL/uL (ref 3.87–5.11)
RDW: 13.6 % (ref 11.5–15.5)
WBC Count: 4.1 10*3/uL (ref 4.0–10.5)
nRBC: 0 % (ref 0.0–0.2)

## 2023-10-23 LAB — LACTATE DEHYDROGENASE: LDH: 173 U/L (ref 98–192)

## 2023-10-23 LAB — CMP (CANCER CENTER ONLY)
ALT: 22 U/L (ref 0–44)
AST: 23 U/L (ref 15–41)
Albumin: 4.2 g/dL (ref 3.5–5.0)
Alkaline Phosphatase: 73 U/L (ref 38–126)
Anion gap: 5 (ref 5–15)
BUN: 16 mg/dL (ref 8–23)
CO2: 30 mmol/L (ref 22–32)
Calcium: 9.1 mg/dL (ref 8.9–10.3)
Chloride: 101 mmol/L (ref 98–111)
Creatinine: 0.92 mg/dL (ref 0.44–1.00)
GFR, Estimated: >60 mL/min (ref >60)
Glucose, Bld: 111 mg/dL — ABNORMAL HIGH (ref 70–99)
Potassium: 3.7 mmol/L (ref 3.5–5.1)
Sodium: 136 mmol/L (ref 135–145)
Total Bilirubin: 0.4 mg/dL (ref 0.0–1.2)
Total Protein: 7 g/dL (ref 6.5–8.1)

## 2023-10-23 LAB — MAGNESIUM: Magnesium: 2 mg/dL (ref 1.7–2.4)

## 2024-02-25 ENCOUNTER — Other Ambulatory Visit: Payer: Self-pay

## 2024-02-25 DIAGNOSIS — C8330 Diffuse large B-cell lymphoma, unspecified site: Secondary | ICD-10-CM

## 2024-02-26 ENCOUNTER — Inpatient Hospital Stay: Attending: Hematology

## 2024-02-26 ENCOUNTER — Inpatient Hospital Stay (HOSPITAL_BASED_OUTPATIENT_CLINIC_OR_DEPARTMENT_OTHER): Admitting: Hematology

## 2024-02-26 VITALS — BP 118/76 | HR 62 | Temp 97.8°F | Resp 17 | Wt 162.5 lb

## 2024-02-26 DIAGNOSIS — Z9484 Stem cells transplant status: Secondary | ICD-10-CM | POA: Insufficient documentation

## 2024-02-26 DIAGNOSIS — Z79899 Other long term (current) drug therapy: Secondary | ICD-10-CM | POA: Diagnosis not present

## 2024-02-26 DIAGNOSIS — C8330 Diffuse large B-cell lymphoma, unspecified site: Secondary | ICD-10-CM

## 2024-02-26 DIAGNOSIS — Z9481 Bone marrow transplant status: Secondary | ICD-10-CM | POA: Diagnosis not present

## 2024-02-26 DIAGNOSIS — C83398 Diffuse large b-cell lymphoma of other extranodal and solid organ sites: Secondary | ICD-10-CM | POA: Insufficient documentation

## 2024-02-26 LAB — CBC WITH DIFFERENTIAL (CANCER CENTER ONLY)
Abs Immature Granulocytes: 0.02 K/uL (ref 0.00–0.07)
Basophils Absolute: 0 K/uL (ref 0.0–0.1)
Basophils Relative: 1 %
Eosinophils Absolute: 0 K/uL (ref 0.0–0.5)
Eosinophils Relative: 1 %
HCT: 38.7 % (ref 36.0–46.0)
Hemoglobin: 13.1 g/dL (ref 12.0–15.0)
Immature Granulocytes: 1 %
Lymphocytes Relative: 42 %
Lymphs Abs: 1.7 K/uL (ref 0.7–4.0)
MCH: 32.1 pg (ref 26.0–34.0)
MCHC: 33.9 g/dL (ref 30.0–36.0)
MCV: 94.9 fL (ref 80.0–100.0)
Monocytes Absolute: 0.3 K/uL (ref 0.1–1.0)
Monocytes Relative: 7 %
Neutro Abs: 1.9 K/uL (ref 1.7–7.7)
Neutrophils Relative %: 48 %
Platelet Count: 177 K/uL (ref 150–400)
RBC: 4.08 MIL/uL (ref 3.87–5.11)
RDW: 13.6 % (ref 11.5–15.5)
WBC Count: 3.9 K/uL — ABNORMAL LOW (ref 4.0–10.5)
nRBC: 0 % (ref 0.0–0.2)

## 2024-02-26 LAB — CMP (CANCER CENTER ONLY)
ALT: 24 U/L (ref 0–44)
AST: 25 U/L (ref 15–41)
Albumin: 4.2 g/dL (ref 3.5–5.0)
Alkaline Phosphatase: 66 U/L (ref 38–126)
Anion gap: 4 — ABNORMAL LOW (ref 5–15)
BUN: 16 mg/dL (ref 8–23)
CO2: 29 mmol/L (ref 22–32)
Calcium: 9.4 mg/dL (ref 8.9–10.3)
Chloride: 105 mmol/L (ref 98–111)
Creatinine: 1 mg/dL (ref 0.44–1.00)
GFR, Estimated: >60 mL/min (ref >60)
Glucose, Bld: 88 mg/dL (ref 70–99)
Potassium: 4.1 mmol/L (ref 3.5–5.1)
Sodium: 138 mmol/L (ref 135–145)
Total Bilirubin: 0.4 mg/dL (ref 0.0–1.2)
Total Protein: 7.1 g/dL (ref 6.5–8.1)

## 2024-02-26 LAB — MAGNESIUM: Magnesium: 2.1 mg/dL (ref 1.7–2.4)

## 2024-02-26 LAB — LACTATE DEHYDROGENASE: LDH: 183 U/L (ref 98–192)

## 2024-02-26 NOTE — Progress Notes (Signed)
 HEMATOLOGY/ONCOLOGY CLINIC NOTE  Date of Service: 02/26/2024   Patient Care Team: Denise Debby HERO, MD as PCP - General (Family Medicine)  CHIEF COMPLAINTS/PURPOSE OF CONSULTATION:  Follow-up for continued evaluation and management of large B-cell lymphoma status post transplant  HISTORY OF PRESENTING ILLNESS:  Please see previous notes for details on initial presentation  INTERVAL HISTORY:   Denise Walls is a 63 y.o. 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   Patient was last seen by me on 10/23/2023  Patient is now 2 years after her autologous hematopoietic stem cell transplant for large B-cell lymphoma in CR 2. She notes no acute new symptoms since her last clinic visit.  Has followed up with her transplant team to stay up to speed with all her vaccinations. No infection issues. Notes that she has been to [place] 2 times by bus.  Good energy levels.  Staying active.  Good p.o. intake.  No new lumps or bumps.  No fevers no chills no night sweats no unexpected weight loss.  MEDICAL HISTORY:  Past Medical History:  Diagnosis Date   Acute cystitis    Anxiety    Arthritis    Cancer (HCC)    Crohn disease (HCC)    dx 2004, history of small bowel obstruction 02/2007 treated conservatively   History of colon polyps    History of shingles    HTN (hypertension)    Hypercholesterolemia    Insomnia    Migraine    Non-Hodgkin lymphoma (HCC)    Uterine fibroid     SURGICAL HISTORY: Past Surgical History:  Procedure Laterality Date   COLONOSCOPY  09/11/2014   Small internal hemorrhoids. Otherwise normal colonoscopy to terminal ileum   COLONOSCOPY  10/02/2017   ESOPHAGOGASTRODUODENOSCOPY  04/05/2007   Mild gastritis. Otherwise, normal esophagogastroduodenoscopy   IR BONE TUMOR(S)RF ABLATION  07/31/2021   IR BONE TUMOR(S)RF ABLATION  07/31/2021   IR CT  SPINE LTD  07/31/2021   IR IMAGING GUIDED PORT INSERTION  11/29/2018   IR KYPHO EA ADDL LEVEL THORACIC OR LUMBAR  07/31/2021   IR KYPHO THORACIC WITH BONE BIOPSY  07/31/2021   IR REMOVAL TUN ACCESS W/ PORT W/O FL MOD SED  02/25/2021   LYMPH NODE BIOPSY Left 12/09/2018   Procedure: LEFT DEEP CERVICAL LYMPH NODE BIOPSY;  Surgeon: Gail Favorite, MD;  Location: [facility] OR;  Service: General;  Laterality: Left;   TUBAL LIGATION      SOCIAL HISTORY: Social History   Socioeconomic History   Marital status: Married    Spouse name: Not on file   Number of children: Not on file   Years of education: Not on file   Highest education level: 6th grade  Occupational History   Not on file  Tobacco Use   Smoking status: Never   Smokeless tobacco: Never  Vaping Use   Vaping status: Never Used  Substance and Sexual Activity   Alcohol use: Never   Drug use: Never   Sexual activity: Not Currently    Birth control/protection: Post-menopausal  Other Topics Concern   Not on file  Social History Narrative   Not on file   Social Drivers of Health   Financial Resource Strain: Not on file  Food Insecurity: Low Risk  (09/09/2022)   Received from [facility]   Hunger Vital Sign    Within the past 12 months, you worried that your food  would run out before you got money to buy more: Never true    Within the past 12 months, the food you bought just didn't last and you didn't have money to get more. : Never true  Transportation Needs: No Transportation Needs (09/09/2022)   Received from [facility]   Transportation    In the past 12 months, has lack of reliable transportation kept you from medical appointments, meetings, work or from getting things needed for daily living? : No  Physical Activity: Not on file  Stress: Not on file  Social Connections: Not on file  Intimate Partner Violence: Not on file    FAMILY HISTORY: Family History  Problem Relation Age of Onset   Heart disease Father     Arthritis Sister    Heart disease Sister    Colon cancer Neg Hx     ALLERGIES:  is allergic to nsaids.  MEDICATIONS:  Current Outpatient Medications  Medication Sig Dispense Refill   acetaminophen  (TYLENOL ) 325 MG tablet Take 325-650 mg by mouth every 6 (six) hours as needed for mild pain.     acyclovir (ZOVIRAX) 800 MG tablet Take 800 mg by mouth 2 (two) times daily.     amLODipine  (NORVASC ) 10 MG tablet Take 1 tablet by mouth daily.     cholecalciferol  (VITAMIN D3) 25 MCG (1000 UT) tablet Take 1 tablet (1,000 Units total) by mouth daily. 30 tablet 3   dexamethasone  (DECADRON ) 2 MG tablet Take 2 tablets (4 mg total) by mouth 2 (two) times daily. 20 tablet 0   folic acid (FOLVITE) 1 MG tablet Take 1 mg by mouth daily.     mesalamine  (LIALDA ) 1.2 g EC tablet Take 2 tablets (2.4 g total) by mouth 2 (two) times daily. 360 tablet 3   nystatin  (MYCOSTATIN ) 100000 UNIT/ML suspension Take 5 mLs (500,000 Units total) by mouth 4 (four) times daily. Swish in the mouth and throat and then swallow 200 mL 0   polyethylene glycol (MIRALAX  / GLYCOLAX ) 17 g packet Take 17 g by mouth daily as needed for mild constipation. 14 each 0   traMADol  (ULTRAM ) 50 MG tablet Take 50 mg by mouth every 6 (six) hours as needed for pain.     triamterene-hydrochlorothiazide (MAXZIDE-25) 37.5-25 MG tablet Take 0.5 tablets by mouth daily.     venlafaxine  XR (EFFEXOR -XR) 37.5 MG 24 hr capsule 75 mg daily with breakfast.     No current facility-administered medications for this visit.    REVIEW OF SYSTEMS:    10 Point review of Systems was done is negative except as noted above.  PHYSICAL EXAMINATION: ECOG PERFORMANCE STATUS: 2 - Symptomatic, <50% confined to bed  Vitals:   02/26/24 1037  BP: 118/76  Pulse: 62  Resp: 17  Temp: 97.8 F (36.6 C)  SpO2: 99%   Filed Weights   02/26/24 1037  Weight: 162 lb 8 oz (73.7 kg)   .Body mass index is 33.96 kg/m.    GENERAL:alert, in no acute distress and  comfortable SKIN: no acute rashes, no significant lesions EYES: conjunctiva are pink and non-injected, sclera anicteric OROPHARYNX: MMM, no exudates, no oropharyngeal erythema or ulceration NECK: supple, no JVD LYMPH:  no palpable lymphadenopathy in the cervical, axillary or inguinal regions LUNGS: clear to auscultation b/l with normal respiratory effort HEART: regular rate & rhythm ABDOMEN:  normoactive bowel sounds , non tender, not distended. Extremity: no pedal edema PSYCH: alert & oriented x 3 with fluent speech NEURO: no focal motor/sensory deficits  LABORATORY DATA:  I have reviewed the data as listed  .    Latest Ref Rng & Units 02/26/2024    9:51 AM 10/23/2023    9:49 AM 06/24/2023    9:49 AM  CBC  WBC 4.0 - 10.5 K/uL 3.9  4.1  4.3   Hemoglobin 12.0 - 15.0 g/dL 86.8  87.2  86.6   Hematocrit 36.0 - 46.0 % 38.7  37.4  39.9   Platelets 150 - 400 K/uL 177  162  160       Latest Ref Rng & Units 02/26/2024    9:51 AM 10/23/2023    9:49 AM 06/24/2023    9:49 AM 02/04/2023   11:17 AM 11/05/2022   12:57 PM 08/05/2022    1:38 PM 05/13/2022    1:26 PM  VITAMINS & MINERALS  Magnesium 1.7 - 2.4 mg/dL 2.1  2.0  2.1  2.0  2.1  2.0  1.9    LDH Lab Results  Component Value Date   LDH 183 02/26/2024      Latest Ref Rng & Units 02/26/2024    9:51 AM 10/23/2023    9:49 AM 06/24/2023    9:49 AM  CMP  Glucose 70 - 99 mg/dL 88  888  75   BUN 8 - 23 mg/dL 16  16  16    Creatinine 0.44 - 1.00 mg/dL 8.99  9.07  9.02   Sodium 135 - 145 mmol/L 138  136  136   Potassium 3.5 - 5.1 mmol/L 4.1  3.7  3.7   Chloride 98 - 111 mmol/L 105  101  100   CO2 22 - 32 mmol/L 29  30  31    Calcium  8.9 - 10.3 mg/dL 9.4  9.1  9.4   Total Protein 6.5 - 8.1 g/dL 7.1  7.0  7.3   Total Bilirubin 0.0 - 1.2 mg/dL 0.4  0.4  0.4   Alkaline Phos 38 - 126 U/L 66  73  79   AST 15 - 41 U/L 25  23  25    ALT 0 - 44 U/L 24  22  23     . Lab Results  Component Value Date   LDH 173 10/23/2023    SURGICAL PATHOLOGY   CASE: [id number]  PATIENT: Jagger Dinino  Surgical Pathology Report      Clinical History: History of lymphoma, post CT and US  guided biopsy of  T11-T12 left paraspinal hypermetabolic soft tissue mass (jmc)    FINAL MICROSCOPIC DIAGNOSIS:   A. SOFT TISSUE MASS, T11-T12, LEFT PARASPINAL, NEEDLE CORE BIOPSY:  -  Diffuse large B-cell lymphoma, germinal center subtype  -  See comment   COMMENT:   The core biopsies shows a diffuse infiltrate of malignant appearing  cells with scant to ample eosinophilic cytoplasm and round to irregular  nuclei with prominent eosinophilic nucleoli.  The malignant cells are  infiltrating and surrounding skeletal muscle.  By immunohistochemistry,  the neoplastic cells are positive for CD20, PAX5, CD10 (variable), BCL6  and Bcl-2 but negative for mum 1, CD5, CD30, and EBV by in situ  hybridization.  The proliferative rate by Ki-67 is increased (up to  40%).  CD3 highlights background T cells.  Flow cytometry was attempted  on the sample ([id number]) but there was insufficient material for cyst.    Overall, these findings are consistent with a diffuse large B-cell  lymphoma, germinal center subtype.  FISH studies (high-grade/large  B-cell panel) are pending and will be reported in an addendum.  RADIOGRAPHIC STUDIES: I have personally reviewed the radiological images as listed and agreed with the findings in the report. No results found.  ASSESSMENT & PLAN:   63 y.o. female with:  1.  History of stage IV DLBCL, GCB subtype; double hit lymphoma -S/p 1 cycle  of R-CHOP and 5 cycles of da EPOCH-R and IT MTX x 4 for CNS prophylaxis -FISH  on 01/03/2019 and were positive for Myc and BCL2 rearrangement  02/14/2019 PET Scan Whole Body ([id number]) revealed 1. Findings favor complete metabolic response. No residual hypermetabolism within the lymph nodes of the neck, chest, abdomen or pelvis, which have all decreased in size in the interval. 2.  Nonspecific new diffuse skeletal hypermetabolism and new mild splenic hypermetabolism, favor reactive state of the marrow and reticuloendothelial system. Spleen is normal size. Continued surveillance with CT or PET-CT advised. 3. Small layering left pleural effusion, decreased. 4.  Aortic Atherosclerosis (ICD10-I70.0).  -Completed 5 cycles of R-EPOCH on 04/04/2019, 6 total cycles of chemotherapy (1st cycle was R-CHOP) -Completed 4 cycles of IT METHOTREXATE  on 04/25/2019  05/23/2019 PET/CT scan ([id number]) revealed Small calcified jejunal mesentery nodes, compatible with treated lymphoma. Deauville category 2. No findings suspicious for active lymphoma. Prior splenic and osseous hypermetabolism is no longer evident.  -Patient has radiographic and pathology findings suggestive of lymphoma recurrence at this time.  -PET CT scan ([id number]) revealed 1. Infiltrative intensely hypermetabolic soft tissue involving the T12-L1 spine, spinal canal and left posterior paraspinal musculature as detailed, compatible with recurrent lymphoma. Deauville category 5.  #2 T12-L1 relapsed large B-cell lymphoma.  FISH panel does not demonstrate c-Myc rearrangement and is negative for Bcl-2 and BCL6.  PLAN:  - Discussed CBC CMP and LDH from today in detail with the patient Labs are unremarkable. Patient had her 17-year-old CT neck chest abdomen pelvis at [facility] on 01/11/2024 which showed no overt evidence of lymphoma in the neck chest abdomen or pelvis To speed with age-appropriate cancer screening with PCP. We discussed that we will be switching to every 75-month follow-ups at this time.\ She was recommended to stay up to speed with age-appropriate cancer screening and vaccinations with PCP Overall in good spirits and expresses significant gratitude for all of her cares. -continue to follow with [facility] transplant team as scheduled   FOLLOW UP: Return to clinic with Dr. Onesimo with labs in 6  months  The total time spent in the appointment was 30 minutes* .  All of the patient's questions were answered with apparent satisfaction. The patient knows to call the clinic with any problems, questions or concerns.   Emaline Onesimo MD MS AAHIVMS [facility] Hematology/Oncology Physician [facility]  .*Total Encounter Time as defined by the Centers for Medicare and Medicaid Services includes, in addition to the face-to-face time of a patient visit (documented in the note above) non-face-to-face time: obtaining and reviewing outside history, ordering and reviewing medications, tests or procedures, care coordination (communications with other health care professionals or caregivers) and documentation in the medical record.    I, Damien Blanks, acting as a Neurosurgeon for Emaline Onesimo, MD.,have documented all relevant documentation on the behalf of Emaline Onesimo, MD,as directed by  Emaline Onesimo, MD while in the presence of Emaline Onesimo, MD.  .I have reviewed the above documentation for accuracy and completeness, and I agree with the above. .Fradel Baldonado Kishore Meredith Kilbride MD

## 2024-03-04 NOTE — Progress Notes (Incomplete)
 HEMATOLOGY/ONCOLOGY CLINIC NOTE  Date of Service: 02/26/2024   Patient Care Team: [name], MD as PCP - General (Family Medicine)  CHIEF COMPLAINTS/PURPOSE OF CONSULTATION:  Follow-up for continued evaluation and management of large B-cell lymphoma status post transplant  HISTORY OF PRESENTING ILLNESS:  Please see previous notes for details on initial presentation  INTERVAL HISTORY:   Denise Walls is a 63 y.o. 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   Patient was last seen by me on 10/23/2023   MEDICAL HISTORY:  Past Medical History:  Diagnosis Date   Acute cystitis    Anxiety    Arthritis    Cancer (HCC)    Crohn disease (HCC)    dx 2004, history of small bowel obstruction 02/2007 treated conservatively   History of colon polyps    History of shingles    HTN (hypertension)    Hypercholesterolemia    Insomnia    Migraine    Non-Hodgkin lymphoma (HCC)    Uterine fibroid     SURGICAL HISTORY: Past Surgical History:  Procedure Laterality Date   COLONOSCOPY  09/11/2014   Small internal hemorrhoids. Otherwise normal colonoscopy to terminal ileum   COLONOSCOPY  10/02/2017   ESOPHAGOGASTRODUODENOSCOPY  04/05/2007   Mild gastritis. Otherwise, normal esophagogastroduodenoscopy   IR BONE TUMOR(S)RF ABLATION  07/31/2021   IR BONE TUMOR(S)RF ABLATION  07/31/2021   IR CT SPINE LTD  07/31/2021   IR IMAGING GUIDED PORT INSERTION  11/29/2018   IR KYPHO EA ADDL LEVEL THORACIC OR LUMBAR  07/31/2021   IR KYPHO THORACIC WITH BONE BIOPSY  07/31/2021   IR REMOVAL TUN ACCESS W/ PORT W/O FL MOD SED  02/25/2021   LYMPH NODE BIOPSY Left 12/09/2018   Procedure: LEFT DEEP CERVICAL LYMPH NODE BIOPSY;  Surgeon: Gail Favorite, MD;  Location: [facility] OR;  Service: General;  Laterality: Left;   TUBAL LIGATION      SOCIAL HISTORY: Social History   Socioeconomic  History   Marital status: Married    Spouse name: Not on file   Number of children: Not on file   Years of education: Not on file   Highest education level: 6th grade  Occupational History   Not on file  Tobacco Use   Smoking status: Never   Smokeless tobacco: Never  Vaping Use   Vaping status: Never Used  Substance and Sexual Activity   Alcohol use: Never   Drug use: Never   Sexual activity: Not Currently    Birth control/protection: Post-menopausal  Other Topics Concern   Not on file  Social History Narrative   Not on file   Social Drivers of Health   Financial Resource Strain: Not on file  Food Insecurity: Low Risk  (09/09/2022)   Received from [facility]   Hunger Vital Sign    Within the past 12 months, you worried that your food would run out before you got money to buy more: Never true    Within the past 12 months, the food you bought just didn't last and you didn't have money to get more. : Never true  Transportation Needs: No Transportation Needs (09/09/2022)   Received from [facility]    In the past 12 months, has lack of reliable transportation kept you from medical appointments, meetings, work or from getting things needed for daily living? : No  Physical Activity: Not on file  Stress: Not on file  Social Connections: Not on file  Intimate Partner Violence: Not on file    FAMILY HISTORY: Family History  Problem Relation Age of Onset   Heart disease Father    Arthritis Sister    Heart disease Sister    Colon cancer Neg Hx     ALLERGIES:  is allergic to nsaids.  MEDICATIONS:  Current Outpatient Medications  Medication Sig Dispense Refill   acetaminophen  (TYLENOL ) 325 MG tablet Take 325-650 mg by mouth every 6 (six) hours as needed for mild pain.     acyclovir (ZOVIRAX) 800 MG tablet Take 800 mg by mouth 2 (two) times daily.     amLODipine  (NORVASC ) 10 MG tablet Take 1 tablet by mouth daily.     cholecalciferol  (VITAMIN D3) 25 MCG  (1000 UT) tablet Take 1 tablet (1,000 Units total) by mouth daily. 30 tablet 3   dexamethasone  (DECADRON ) 2 MG tablet Take 2 tablets (4 mg total) by mouth 2 (two) times daily. 20 tablet 0   folic acid (FOLVITE) 1 MG tablet Take 1 mg by mouth daily.     mesalamine  (LIALDA ) 1.2 g EC tablet Take 2 tablets (2.4 g total) by mouth 2 (two) times daily. 360 tablet 3   nystatin  (MYCOSTATIN ) 100000 UNIT/ML suspension Take 5 mLs (500,000 Units total) by mouth 4 (four) times daily. Swish in the mouth and throat and then swallow 200 mL 0   polyethylene glycol (MIRALAX  / GLYCOLAX ) 17 g packet Take 17 g by mouth daily as needed for mild constipation. 14 each 0   traMADol  (ULTRAM ) 50 MG tablet Take 50 mg by mouth every 6 (six) hours as needed for pain.     triamterene-hydrochlorothiazide (MAXZIDE-25) 37.5-25 MG tablet Take 0.5 tablets by mouth daily.     venlafaxine  XR (EFFEXOR -XR) 37.5 MG 24 hr capsule 75 mg daily with breakfast.     No current facility-administered medications for this visit.    REVIEW OF SYSTEMS:    10 Point review of Systems was done is negative except as noted above.   PHYSICAL EXAMINATION: ECOG PERFORMANCE STATUS: 2 - Symptomatic, <50% confined to bed  There were no vitals filed for this visit.   There were no vitals filed for this visit.   .There is no height or weight on file to calculate BMI.    GENERAL:alert, in no acute distress and comfortable SKIN: no acute rashes, no significant lesions EYES: conjunctiva are pink and non-injected, sclera anicteric OROPHARYNX: MMM, no exudates, no oropharyngeal erythema or ulceration NECK: supple, no JVD LYMPH:  no palpable lymphadenopathy in the cervical, axillary or inguinal regions LUNGS: clear to auscultation b/l with normal respiratory effort HEART: regular rate & rhythm ABDOMEN:  normoactive bowel sounds , non tender, not distended. Extremity: no pedal edema PSYCH: alert & oriented x 3 with fluent speech NEURO: no focal  motor/sensory deficits   LABORATORY DATA:  I have reviewed the data as listed  .    Latest Ref Rng & Units 10/23/2023    9:49 AM 06/24/2023    9:49 AM 02/04/2023   11:17 AM  CBC  WBC 4.0 - 10.5 K/uL 4.1  4.3  4.8   Hemoglobin 12.0 - 15.0 g/dL 87.2  86.6  87.3   Hematocrit 36.0 - 46.0 % 37.4  39.9  37.2   Platelets 150 - 400 K/uL 162  160  154       Latest Ref Rng & Units 10/23/2023    9:49 AM 06/24/2023  9:49 AM 02/04/2023   11:17 AM 11/05/2022   12:57 PM 08/05/2022    1:38 PM 05/13/2022    1:26 PM 03/10/2022    9:31 AM  VITAMINS & MINERALS  Magnesium 1.7 - 2.4 mg/dL 2.0  2.1  2.0  2.1  2.0  1.9  2.0    LDH Lab Results  Component Value Date   LDH 173 10/23/2023      Latest Ref Rng & Units 10/23/2023    9:49 AM 06/24/2023    9:49 AM 02/04/2023   11:17 AM  CMP  Glucose 70 - 99 mg/dL 888  75  89   BUN 8 - 23 mg/dL 16  16  15    Creatinine 0.44 - 1.00 mg/dL 9.07  9.02  9.09   Sodium 135 - 145 mmol/L 136  136  137   Potassium 3.5 - 5.1 mmol/L 3.7  3.7  4.0   Chloride 98 - 111 mmol/L 101  100  104   CO2 22 - 32 mmol/L 30  31  27    Calcium  8.9 - 10.3 mg/dL 9.1  9.4  9.2   Total Protein 6.5 - 8.1 g/dL 7.0  7.3  7.3   Total Bilirubin 0.0 - 1.2 mg/dL 0.4  0.4  0.4   Alkaline Phos 38 - 126 U/L 73  79  79   AST 15 - 41 U/L 23  25  21    ALT 0 - 44 U/L 22  23  21     . Lab Results  Component Value Date   LDH 173 10/23/2023    SURGICAL PATHOLOGY  CASE: [id number]  PATIENT: Katelee Cryer  Surgical Pathology Report      Clinical History: History of lymphoma, post CT and US  guided biopsy of  T11-T12 left paraspinal hypermetabolic soft tissue mass (jmc)    FINAL MICROSCOPIC DIAGNOSIS:   A. SOFT TISSUE MASS, T11-T12, LEFT PARASPINAL, NEEDLE CORE BIOPSY:  -  Diffuse large B-cell lymphoma, germinal center subtype  -  See comment   COMMENT:   The core biopsies shows a diffuse infiltrate of malignant appearing  cells with scant to ample eosinophilic cytoplasm and round to  irregular  nuclei with prominent eosinophilic nucleoli.  The malignant cells are  infiltrating and surrounding skeletal muscle.  By immunohistochemistry,  the neoplastic cells are positive for CD20, PAX5, CD10 (variable), BCL6  and Bcl-2 but negative for mum 1, CD5, CD30, and EBV by in situ  hybridization.  The proliferative rate by Ki-67 is increased (up to  40%).  CD3 highlights background T cells.  Flow cytometry was attempted  on the sample ([id number]) but there was insufficient material for cyst.    Overall, these findings are consistent with a diffuse large B-cell  lymphoma, germinal center subtype.  FISH studies (high-grade/large  B-cell panel) are pending and will be reported in an addendum.     RADIOGRAPHIC STUDIES: I have personally reviewed the radiological images as listed and agreed with the findings in the report. No results found.  ASSESSMENT & PLAN:   63 y.o. female with:  1.  History of stage IV DLBCL, GCB subtype; double hit lymphoma -S/p 1 cycle  of R-CHOP and 5 cycles of da EPOCH-R and IT MTX x 4 for CNS prophylaxis -FISH  on 01/03/2019 and were positive for Myc and BCL2 rearrangement  02/14/2019 PET Scan Whole Body ([id number]) revealed 1. Findings favor complete metabolic response. No residual hypermetabolism within the lymph nodes of the neck, chest, abdomen or pelvis,  which have all decreased in size in the interval. 2. Nonspecific new diffuse skeletal hypermetabolism and new mild splenic hypermetabolism, favor reactive state of the marrow and reticuloendothelial system. Spleen is normal size. Continued surveillance with CT or PET-CT advised. 3. Small layering left pleural effusion, decreased. 4.  Aortic Atherosclerosis (ICD10-I70.0).  -Completed 5 cycles of R-EPOCH on 04/04/2019, 6 total cycles of chemotherapy (1st cycle was R-CHOP) -Completed 4 cycles of IT METHOTREXATE  on 04/25/2019  05/23/2019 PET/CT scan ([id number]) revealed Small calcified jejunal  mesentery nodes, compatible with treated lymphoma. Deauville category 2. No findings suspicious for active lymphoma. Prior splenic and osseous hypermetabolism is no longer evident.  -Patient has radiographic and pathology findings suggestive of lymphoma recurrence at this time.  -PET CT scan ([id number]) revealed 1. Infiltrative intensely hypermetabolic soft tissue involving the T12-L1 spine, spinal canal and left posterior paraspinal musculature as detailed, compatible with recurrent lymphoma. Deauville category 5.  #2 T12-L1 relapsed large B-cell lymphoma.  FISH panel does not demonstrate c-Myc rearrangement and is negative for Bcl-2 and BCL6.  PLAN:   -Discussed lab results on 10/23/2023 in detail with patient. CBC showed WBC of 4.1K, hemoglobin of 12.7, and platelets of 162K. -CBC normal -CMP stable and LDH wnl -did not feel any enlarged lymph nodes during physical examination -there is no clinical sign or lab evidence suggestive of lymphoma recurrence at this time -patient is nearly 2 years out from her hematopoietic stem cell transplant in August 2023 -patient will receive scan in August around 2 year mark post-transplant -recommend staying UTD with her age-appropriate cancer screenings, including mammogram and colonoscopies with PCP  -patient is okay to proceed with colonoscopy from oncologic standpoint if there is a need for one -continue to stay physically active -patient shall return to clinic in 4 months then we shall extend visits to 6 month follow ups for the next two years  -continue to follow with [facility] transplant team as scheduled   FOLLOW UP: RTC with Dr Onesimo with labs in 4 months  The total time spent in the appointment was *** minutes* .  All of the patient's questions were answered with apparent satisfaction. The patient knows to call the clinic with any problems, questions or concerns.   Emaline Onesimo MD MS AAHIVMS [facility] Hematology/Oncology  Physician [facility]  .*Total Encounter Time as defined by the Centers for Medicare and Medicaid Services includes, in addition to the face-to-face time of a patient visit (documented in the note above) non-face-to-face time: obtaining and reviewing outside history, ordering and reviewing medications, tests or procedures, care coordination (communications with other health care professionals or caregivers) and documentation in the medical record.    I, Damien Blanks, acting as a Neurosurgeon for Emaline Onesimo, MD.,have documented all relevant documentation on the behalf of Emaline Onesimo, MD,as directed by  Emaline Onesimo, MD while in the presence of Emaline Onesimo, MD.  .I have reviewed the above documentation for accuracy and completeness, and I agree with the above. .Gautam Kishore Kale MD

## 2024-08-26 ENCOUNTER — Ambulatory Visit: Admitting: Hematology

## 2024-08-26 ENCOUNTER — Other Ambulatory Visit
